# Patient Record
Sex: Male | Born: 1945
Health system: Southern US, Community
[De-identification: ages and names within clinical notes are randomized; demographics above are authoritative.]

## PROBLEM LIST (undated history)

## (undated) DIAGNOSIS — F4310 Post-traumatic stress disorder, unspecified: Secondary | ICD-10-CM

## (undated) DIAGNOSIS — R030 Elevated blood-pressure reading, without diagnosis of hypertension: Secondary | ICD-10-CM

## (undated) DIAGNOSIS — J45909 Unspecified asthma, uncomplicated: Secondary | ICD-10-CM

## (undated) DIAGNOSIS — K219 Gastro-esophageal reflux disease without esophagitis: Secondary | ICD-10-CM

## (undated) DIAGNOSIS — D649 Anemia, unspecified: Secondary | ICD-10-CM

## (undated) DIAGNOSIS — F32A Depression, unspecified: Secondary | ICD-10-CM

## (undated) DIAGNOSIS — F419 Anxiety disorder, unspecified: Secondary | ICD-10-CM

## (undated) DIAGNOSIS — Z86010 Personal history of colonic polyps: Secondary | ICD-10-CM

## (undated) DIAGNOSIS — Z860101 Personal history of adenomatous and serrated colon polyps: Secondary | ICD-10-CM

## (undated) DIAGNOSIS — G8929 Other chronic pain: Secondary | ICD-10-CM

## (undated) DIAGNOSIS — Z8709 Personal history of other diseases of the respiratory system: Secondary | ICD-10-CM

## (undated) DIAGNOSIS — G4730 Sleep apnea, unspecified: Secondary | ICD-10-CM

## (undated) DIAGNOSIS — F329 Major depressive disorder, single episode, unspecified: Secondary | ICD-10-CM

## (undated) DIAGNOSIS — M1990 Unspecified osteoarthritis, unspecified site: Secondary | ICD-10-CM

## (undated) DIAGNOSIS — R7303 Prediabetes: Secondary | ICD-10-CM

## (undated) HISTORY — DX: Unspecified asthma, uncomplicated: J45.909

## (undated) HISTORY — DX: Post-traumatic stress disorder, unspecified: F43.10

## (undated) HISTORY — DX: Prediabetes: R73.03

## (undated) HISTORY — DX: Major depressive disorder, single episode, unspecified: F32.9

## (undated) HISTORY — DX: Gastro-esophageal reflux disease without esophagitis: K21.9

## (undated) HISTORY — DX: Anxiety disorder, unspecified: F41.9

## (undated) HISTORY — DX: Unspecified osteoarthritis, unspecified site: M19.90

## (undated) HISTORY — PX: NASAL SINUS SURGERY: SHX719

## (undated) HISTORY — DX: Personal history of other diseases of the respiratory system: Z87.09

## (undated) HISTORY — DX: Elevated blood-pressure reading, without diagnosis of hypertension: R03.0

## (undated) HISTORY — PX: COLONOSCOPY: SHX174

## (undated) HISTORY — PX: POLYPECTOMY: SHX149

## (undated) HISTORY — DX: Anemia, unspecified: D64.9

## (undated) HISTORY — PX: FINGER SURGERY: SHX640

## (undated) HISTORY — DX: Depression, unspecified: F32.A

## (undated) HISTORY — DX: Sleep apnea, unspecified: G47.30

---

## 2000-06-10 ENCOUNTER — Encounter: Admission: RE | Admit: 2000-06-10 | Discharge: 2000-06-10 | Payer: Self-pay | Admitting: *Deleted

## 2000-06-10 ENCOUNTER — Encounter: Payer: Self-pay | Admitting: Allergy and Immunology

## 2001-01-07 ENCOUNTER — Encounter: Payer: Self-pay | Admitting: Pulmonary Disease

## 2001-01-07 ENCOUNTER — Encounter: Admission: RE | Admit: 2001-01-07 | Discharge: 2001-01-07 | Payer: Self-pay | Admitting: Pulmonary Disease

## 2004-05-15 ENCOUNTER — Ambulatory Visit: Payer: Self-pay | Admitting: Pulmonary Disease

## 2004-07-04 ENCOUNTER — Ambulatory Visit: Payer: Self-pay | Admitting: Pulmonary Disease

## 2005-02-05 ENCOUNTER — Ambulatory Visit: Payer: Self-pay | Admitting: Pulmonary Disease

## 2005-06-10 ENCOUNTER — Ambulatory Visit: Payer: Self-pay | Admitting: Pulmonary Disease

## 2005-08-20 ENCOUNTER — Encounter: Admission: RE | Admit: 2005-08-20 | Discharge: 2005-08-20 | Payer: Self-pay | Admitting: Otolaryngology

## 2006-05-13 ENCOUNTER — Ambulatory Visit: Payer: Self-pay | Admitting: Pulmonary Disease

## 2007-02-24 ENCOUNTER — Ambulatory Visit: Payer: Self-pay | Admitting: Pulmonary Disease

## 2008-03-14 ENCOUNTER — Encounter: Payer: Self-pay | Admitting: Pulmonary Disease

## 2008-03-21 ENCOUNTER — Encounter: Admission: RE | Admit: 2008-03-21 | Discharge: 2008-03-21 | Payer: Self-pay | Admitting: Otolaryngology

## 2008-03-31 DIAGNOSIS — R7301 Impaired fasting glucose: Secondary | ICD-10-CM | POA: Insufficient documentation

## 2008-03-31 DIAGNOSIS — D649 Anemia, unspecified: Secondary | ICD-10-CM | POA: Insufficient documentation

## 2008-03-31 DIAGNOSIS — D509 Iron deficiency anemia, unspecified: Secondary | ICD-10-CM | POA: Insufficient documentation

## 2008-03-31 DIAGNOSIS — K219 Gastro-esophageal reflux disease without esophagitis: Secondary | ICD-10-CM

## 2008-03-31 DIAGNOSIS — R519 Headache, unspecified: Secondary | ICD-10-CM | POA: Insufficient documentation

## 2008-03-31 DIAGNOSIS — R071 Chest pain on breathing: Secondary | ICD-10-CM

## 2008-03-31 DIAGNOSIS — J209 Acute bronchitis, unspecified: Secondary | ICD-10-CM

## 2008-03-31 DIAGNOSIS — R51 Headache: Secondary | ICD-10-CM

## 2008-04-01 ENCOUNTER — Ambulatory Visit: Payer: Self-pay | Admitting: Pulmonary Disease

## 2008-04-29 ENCOUNTER — Ambulatory Visit: Payer: Self-pay | Admitting: Pulmonary Disease

## 2008-05-24 ENCOUNTER — Ambulatory Visit: Payer: Self-pay | Admitting: Pulmonary Disease

## 2008-05-26 DIAGNOSIS — F411 Generalized anxiety disorder: Secondary | ICD-10-CM | POA: Insufficient documentation

## 2008-05-26 DIAGNOSIS — J329 Chronic sinusitis, unspecified: Secondary | ICD-10-CM | POA: Insufficient documentation

## 2008-05-26 DIAGNOSIS — I10 Essential (primary) hypertension: Secondary | ICD-10-CM | POA: Insufficient documentation

## 2008-05-26 LAB — CONVERTED CEMR LAB
ALT: 14 units/L (ref 0–53)
AST: 25 units/L (ref 0–37)
Albumin: 3.7 g/dL (ref 3.5–5.2)
Alkaline Phosphatase: 60 units/L (ref 39–117)
BUN: 14 mg/dL (ref 6–23)
Basophils Absolute: 0 10*3/uL (ref 0.0–0.1)
Basophils Relative: 0 % (ref 0.0–3.0)
Bilirubin, Direct: 0.2 mg/dL (ref 0.0–0.3)
CO2: 31 meq/L (ref 19–32)
Calcium: 9.1 mg/dL (ref 8.4–10.5)
Chloride: 103 meq/L (ref 96–112)
Creatinine, Ser: 1 mg/dL (ref 0.4–1.5)
Eosinophils Absolute: 0.3 10*3/uL (ref 0.0–0.7)
Eosinophils Relative: 6 % — ABNORMAL HIGH (ref 0.0–5.0)
GFR calc Af Amer: 97 mL/min
GFR calc non Af Amer: 80 mL/min
Glucose, Bld: 86 mg/dL (ref 70–99)
HCT: 35.7 % — ABNORMAL LOW (ref 39.0–52.0)
Hemoglobin: 11.4 g/dL — ABNORMAL LOW (ref 13.0–17.0)
Hgb A1c MFr Bld: 5.9 % (ref 4.6–6.0)
Lymphocytes Relative: 43.4 % (ref 12.0–46.0)
MCHC: 31.3 g/dL (ref 30.0–36.0)
MCV: 66.4 fL — ABNORMAL LOW (ref 78.0–100.0)
Monocytes Absolute: 0.6 10*3/uL (ref 0.1–1.0)
Monocytes Relative: 11.4 % (ref 3.0–12.0)
Neutro Abs: 2.1 10*3/uL (ref 1.4–7.7)
Neutrophils Relative %: 39.2 % — ABNORMAL LOW (ref 43.0–77.0)
PSA: 0.68 ng/mL (ref 0.10–4.00)
Platelets: 145 10*3/uL — ABNORMAL LOW (ref 150–400)
Potassium: 4.2 meq/L (ref 3.5–5.1)
RBC: 5.49 M/uL (ref 4.22–5.81)
RDW: 15.5 % — ABNORMAL HIGH (ref 11.5–14.6)
Sodium: 139 meq/L (ref 135–145)
TSH: 1.34 microintl units/mL (ref 0.35–5.50)
Total Bilirubin: 0.9 mg/dL (ref 0.3–1.2)
Total Protein: 6.8 g/dL (ref 6.0–8.3)
WBC: 5.3 10*3/uL (ref 4.5–10.5)

## 2010-06-12 ENCOUNTER — Encounter: Payer: Self-pay | Admitting: Pulmonary Disease

## 2010-06-15 ENCOUNTER — Encounter
Admission: RE | Admit: 2010-06-15 | Discharge: 2010-06-15 | Payer: Self-pay | Source: Home / Self Care | Attending: Otolaryngology | Admitting: Otolaryngology

## 2010-06-21 ENCOUNTER — Ambulatory Visit: Payer: Self-pay | Admitting: Pulmonary Disease

## 2010-06-21 DIAGNOSIS — M1990 Unspecified osteoarthritis, unspecified site: Secondary | ICD-10-CM | POA: Insufficient documentation

## 2010-06-21 DIAGNOSIS — F4310 Post-traumatic stress disorder, unspecified: Secondary | ICD-10-CM | POA: Insufficient documentation
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
## 2010-08-02 NOTE — Letter (Signed)
Summary: [facility]  [facility]   Imported By: Sherian Rein 07/05/2010 09:49:16  _____________________________________________________________________  External Attachment:    Type:   Image     Comment:   External Document

## 2011-08-06 ENCOUNTER — Telehealth: Payer: Self-pay | Admitting: Pulmonary Disease

## 2011-08-06 MED ORDER — FIRST-DUKES MOUTHWASH MT SUSP: OROMUCOSAL | Status: DC

## 2011-08-06 MED ORDER — AZITHROMYCIN 250 MG PO TABS: ORAL_TABLET | ORAL | Status: AC

## 2011-08-06 NOTE — Telephone Encounter (Signed)
I spoke with pt and is aware rx's where sent to the pharmacy

## 2011-08-06 NOTE — Telephone Encounter (Signed)
Per SN---we have no openings---we can call in zpak #1  Take as directed with no refills, mucinex 2 po bid with plenty of fluids and nasal saline spray every 1-2 hours while awake.  If severe symptoms pt will need to be seen at Urgent care.  thanks

## 2011-08-06 NOTE — Telephone Encounter (Signed)
I spoke with pt and advised him of SN recs. He also states he would like MMW called in for him as well. Please advise Dr. Kriste Basque, thanks

## 2011-08-06 NOTE — Telephone Encounter (Signed)
Ok for the MMW   #4oz  1 tsp gargle and swallow four times daily as needed with 1 refill.  thanks

## 2011-08-06 NOTE — Telephone Encounter (Signed)
I spoke with pt and he c/o nasal congestion, chills, cough w/ thick white phlem sore throat x 2 days-denies any fever, nausea, vomiting. Pt has not tried anything OTC. Pt wanted to come in for OV but nothing available. Pt then would like something called in. Pt last seen 06/11/11. Please advise Dr. Kriste Basque, thanks  Allergies no known allergies   [facility]

## 2011-12-27 ENCOUNTER — Telehealth: Payer: Self-pay | Admitting: Pulmonary Disease

## 2011-12-27 MED ORDER — AZITHROMYCIN 250 MG PO TABS: ORAL_TABLET | ORAL | Status: AC

## 2011-12-27 NOTE — Telephone Encounter (Signed)
Per SN--ok for pt to have zpak #1  Take as directed with no refills, start on mucinex otc 600mg   2 po bid with plenty of fluids.  Called and  Spoke with pt and he is aware of SN recs.  Nothing further needed.

## 2011-12-27 NOTE — Telephone Encounter (Signed)
Spoke with pt. He is c/o head and chest congestion and prod cough with large amounts of tan colored sputum. He states no SOB, CP or tightness, f/c/s. Would like something called in. He was last seen in 2011. I have scheduled him rov with SN for august. Please advise thanks! Allergies no known allergies

## 2012-02-04 ENCOUNTER — Encounter: Payer: Self-pay | Admitting: Pulmonary Disease

## 2012-02-05 ENCOUNTER — Encounter: Payer: Self-pay | Admitting: Pulmonary Disease

## 2012-02-05 ENCOUNTER — Ambulatory Visit (INDEPENDENT_AMBULATORY_CARE_PROVIDER_SITE_OTHER): Payer: Medicare Other | Admitting: Pulmonary Disease

## 2012-02-05 VITALS — BP 138/88 | HR 54 | Temp 97.2°F | Ht 74.0 in | Wt 262.2 lb

## 2012-02-05 DIAGNOSIS — I10 Essential (primary) hypertension: Secondary | ICD-10-CM

## 2012-02-05 DIAGNOSIS — J209 Acute bronchitis, unspecified: Secondary | ICD-10-CM

## 2012-02-05 DIAGNOSIS — M1990 Unspecified osteoarthritis, unspecified site: Secondary | ICD-10-CM

## 2012-02-05 DIAGNOSIS — F4310 Post-traumatic stress disorder, unspecified: Secondary | ICD-10-CM

## 2012-02-05 DIAGNOSIS — K219 Gastro-esophageal reflux disease without esophagitis: Secondary | ICD-10-CM

## 2012-02-05 DIAGNOSIS — D649 Anemia, unspecified: Secondary | ICD-10-CM

## 2012-02-05 NOTE — Patient Instructions (Addendum)
Today we updated your med list in our EPIC system...    Continue your current medications the same...  We reviewed your prev lab data from 2009 & gave you a copy to show the [place]...    If it's convenient- forward a copy of the [facility] labs to my attention...  Don't forget to set up your follow up appts w/ DrPatterson for GI...  Call for any questions.Marland KitchenMarland Kitchen
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
## 2012-06-11 ENCOUNTER — Telehealth: Payer: Self-pay | Admitting: Pulmonary Disease

## 2012-06-11 NOTE — Telephone Encounter (Signed)
I spoke with pt and he states he ate salty greens yesterday and believe his BP was a little high. He did not check it but afterwards felt like his "head was going to fall off" and felt like this x 1 hr. As he recalls he has never felt like this in the past and has not had any problems with his bp in the past. He believes he needs to see a heart specialists or maybe SN could help him with this. Pt does not monitor his BP. Please advise SN thanks Last OV 02/05/12 BP then was 138/88 No pending OV  No Known Allergies

## 2012-06-11 NOTE — Telephone Encounter (Signed)
Per SN---his best option for cardiac eval is through the [place] [facility]---they do all of his labs and they can get him in to see cardiologist. Pt voiced his understanding of this and will try to get set up with cardiology through the [place].

## 2012-07-07 ENCOUNTER — Telehealth: Payer: Self-pay | Admitting: Pulmonary Disease

## 2012-07-07 NOTE — Telephone Encounter (Signed)
Scheduled pt appt to see tp at 9am 07/08/12--pt aware of appt

## 2012-07-08 ENCOUNTER — Encounter: Payer: Self-pay | Admitting: Adult Health

## 2012-07-08 ENCOUNTER — Ambulatory Visit (INDEPENDENT_AMBULATORY_CARE_PROVIDER_SITE_OTHER): Payer: Medicare Other | Admitting: Adult Health

## 2012-07-08 VITALS — BP 140/80 | HR 55 | Temp 98.0°F | Ht 74.0 in | Wt 257.8 lb

## 2012-07-08 DIAGNOSIS — J069 Acute upper respiratory infection, unspecified: Secondary | ICD-10-CM

## 2012-07-08 MED ORDER — AZITHROMYCIN 250 MG PO TABS: ORAL_TABLET | ORAL | Status: AC
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
## 2012-07-08 NOTE — Assessment & Plan Note (Signed)
Zpack take as directed.  Mucinex DM Twice daily  As needed  For cough and congestion  Tylenol As needed   Saline nasal rinses As needed   Fluids and rest  Please contact office for sooner follow up if symptoms do not improve or worsen or seek emergency care   

## 2012-07-08 NOTE — Patient Instructions (Addendum)
Zpack take as directed.  Mucinex DM Twice daily  As needed  For cough and congestion  Tylenol As needed   Saline nasal rinses As needed   Fluids and rest  Please contact office for sooner follow up if symptoms do not improve or worsen or seek emergency care

## 2012-08-24 ENCOUNTER — Encounter: Payer: Self-pay | Admitting: Pulmonary Disease

## 2012-08-24 ENCOUNTER — Ambulatory Visit (INDEPENDENT_AMBULATORY_CARE_PROVIDER_SITE_OTHER): Payer: Medicare Other | Admitting: Pulmonary Disease

## 2012-08-24 VITALS — BP 144/86 | HR 59 | Temp 99.5°F | Ht 74.0 in | Wt 260.0 lb

## 2012-08-24 DIAGNOSIS — R7309 Other abnormal glucose: Secondary | ICD-10-CM

## 2012-08-24 DIAGNOSIS — M1990 Unspecified osteoarthritis, unspecified site: Secondary | ICD-10-CM

## 2012-08-24 DIAGNOSIS — F4310 Post-traumatic stress disorder, unspecified: Secondary | ICD-10-CM

## 2012-08-24 DIAGNOSIS — K219 Gastro-esophageal reflux disease without esophagitis: Secondary | ICD-10-CM

## 2012-08-24 DIAGNOSIS — I10 Essential (primary) hypertension: Secondary | ICD-10-CM

## 2012-08-24 DIAGNOSIS — R51 Headache: Secondary | ICD-10-CM

## 2012-08-24 DIAGNOSIS — D649 Anemia, unspecified: Secondary | ICD-10-CM

## 2012-08-24 NOTE — Progress Notes (Deleted)
Subjective:     Patient ID: Shane [name], male   DOB: [birth year], 66 y.o.   MRN: [id number]  HPI   Review of Systems     Objective:   Physical Exam     Assessment:     ***    Plan:     ***
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
## 2012-08-24 NOTE — Patient Instructions (Addendum)
Today we updated your med list in our EPIC system...    Continue your current medications the same...  Be sure to get your general check up at the [facility] w/ CXR, EKG, & FASTING blood work...    Please send copies of these results to my attention...  You are due for a colonoscopy from drPatterson-    Contact his office at [phone]  Call for any problems.Marland KitchenMarland Kitchen

## 2012-08-25 ENCOUNTER — Encounter: Payer: Self-pay | Admitting: Gastroenterology

## 2012-09-10 ENCOUNTER — Encounter: Payer: Self-pay | Admitting: *Deleted

## 2012-09-15 ENCOUNTER — Encounter: Payer: Self-pay | Admitting: Gastroenterology

## 2012-09-15 ENCOUNTER — Ambulatory Visit (INDEPENDENT_AMBULATORY_CARE_PROVIDER_SITE_OTHER): Payer: Medicare Other | Admitting: Gastroenterology

## 2012-09-15 VITALS — BP 128/86 | HR 68 | Ht 71.65 in | Wt 251.0 lb

## 2012-09-15 DIAGNOSIS — D649 Anemia, unspecified: Secondary | ICD-10-CM

## 2012-09-15 DIAGNOSIS — Z1211 Encounter for screening for malignant neoplasm of colon: Secondary | ICD-10-CM

## 2012-09-15 DIAGNOSIS — E739 Lactose intolerance, unspecified: Secondary | ICD-10-CM

## 2012-09-15 DIAGNOSIS — K219 Gastro-esophageal reflux disease without esophagitis: Secondary | ICD-10-CM

## 2012-09-15 DIAGNOSIS — Z86010 Personal history of colonic polyps: Secondary | ICD-10-CM

## 2012-09-15 MED ORDER — MOVIPREP 100 G PO SOLR: 1.0000 | Freq: Once | ORAL | Status: DC

## 2012-09-15 NOTE — Patient Instructions (Signed)
  You have been scheduled for an endoscopy and colonoscopy with propofol. Please follow the written instructions given to you at your visit today. Please pick up your prep at the pharmacy within the next 1-3 days. If you use inhalers (even only as needed), please bring them with you on the day of your procedure. ______________________________________________________________________________________________                                               We are excited to introduce MyChart, a new best-in-class service that provides you online access to important information in your electronic medical record. We want to make it easier for you to view your health information - all in one secure location - when and where you need it. We expect MyChart will enhance the quality of care and service we provide.  When you register for MyChart, you can:    View your test results.    Request appointments and receive appointment reminders via email.    Request medication renewals.    View your medical history, allergies, medications and immunizations.    Communicate with your physician's office through a password-protected site.    Conveniently print information such as your medication lists.  To find out if MyChart is right for you, please talk to a member of our clinical staff today. We will gladly answer your questions about this free health and wellness tool.  If you are age 68 or older and want a member of your family to have access to your record, you must provide written consent by completing a proxy form available at our office. Please speak to our clinical staff about guidelines regarding accounts for patients younger than age 68.  As you activate your MyChart account and need any technical assistance, please call the MyChart technical support line at [phone]) or email your question to [email or web address]. If you email your question(s), please include your name, a  return phone number and the best time to reach you.  If you have non-urgent health-related questions, you can send a message to our office through MyChart at [email or web address]. If you have a medical emergency, call 911.  Thank you for using MyChart as your new health and wellness resource!   MyChart licensed from [employer],  [id number]. Patents Pending.

## 2012-09-15 NOTE — Progress Notes (Signed)
History of Present Illness:  This is a very nice 67 year old African American male with thalassemia and chronic anemia.  Seen him for the last 20 years because of chronic GERD, and he is been [facility].  He is been on and off of PPI medication for the last 15 years, and denies dysphagia or significant GERD at this time.  He also has regular bowel movements and denies melena or hematochezia.  Last colonoscopy was in 1989, and he did have removal of benign polyps several years before that.  He denies any GI problems for occasional hemorrhoidal rectal bleeding.  His appetite is good and his weight is stable, he has major lactose intolerance.  Family history is noncontributory.  Primary care evaluation showed stable asthma, but he otherwise is in good condition except for some PTSD psychiatric problems.  I have reviewed this patient's present history, medical and surgical past history, allergies and medications.     ROS:   All systems were reviewed and are negative unless otherwise stated in the HPI.    Physical Exam: Blood pressure 128/86, pulse 68 and regular and weight 251 pounds the BMI of 34.37.  98% oxygen saturation. General well developed well nourished patient in no acute distress, appearing their stated age Eyes PERRLA, no icterus, fundoscopic exam per opthamologist Skin no lesions noted Neck supple, no adenopathy, no thyroid enlargement, no tenderness Chest clear to percussion and auscultation Heart no significant murmurs, gallops or rubs noted Abdomen no hepatosplenomegaly masses or tenderness, BS normal.  Extremities no acute joint lesions, edema, phlebitis or evidence of cellulitis. Neurologic patient oriented x 3, cranial nerves intact, no focal neurologic deficits noted. Psychological mental status normal and normal affect.  Assessment and plan: Healthy patient with thalassemia and mild anemia.  He is due for followup  colonoscopy which has been scheduled at his convenience.  I have reviewed the risk and benefits of this procedure, various alternative examinations of the colon, and his colon prep.  We also will perform endoscopy to screen for Barrett's mucosa.  Again, I have reviewed chronic acid reflux regime with this patient.  I advised him about a lactose-free diet but he can use when necessary Lactaid tablets as tolerated.  I also advised him to take daily PPI therapy because of his chronic GERD which in the past been documented by endoscopy and 24-hour pH probe testing.  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  Previous 24-hour pH probe testing and 2002 showed normal esophageal clearance with upright acid reflux distally and proximally.  Previous testing for H. pylori was negative.  Cannot find previous pathology reports.  Encounter Diagnoses  Name Primary?  Marland Kitchen Anemia Yes  . GERD (gastroesophageal reflux disease)   . Special screening for malignant neoplasms, colon

## 2012-09-17 ENCOUNTER — Telehealth: Payer: Self-pay | Admitting: Gastroenterology

## 2012-09-17 MED ORDER — CLORAZEPATE DIPOTASSIUM 7.5 MG PO TABS: 7.5000 mg | ORAL_TABLET | Freq: Three times a day (TID) | ORAL | Status: DC | PRN

## 2012-09-17 NOTE — Telephone Encounter (Signed)
Called and spoke with pt and he is aware of SN recs to try the clorazepate 7.5mg   #90  1 po tid prn for his headaches.   i called and spoke with pt and he is aware of SN recs and will try this medication.  Pt is aware that this has been called to the pharmacy.  Nothing further is needed.

## 2012-09-17 NOTE — Telephone Encounter (Signed)
Spoke with pt and notified of recs per SN He states that he does not want to start back on the clonazepam at this time Wants something short term that he would not need to be weaned off of I offered ov with TP for next wk to discuss, but he states that he wants something now SN, please advise thanks

## 2012-09-17 NOTE — Telephone Encounter (Signed)
Per SN---  In SN opinion---the clorazepam 0.5 mg  Bid  He was on previously would help with this problem and prevent his headaches.  We can send this in for him if he would like.  thanks

## 2012-09-17 NOTE — Telephone Encounter (Signed)
Pt c/o headache since yesterday. He is concerned because he takes Tylenol and the headache comes right back. He denies any problems with vision, weakness on one side of his body, speech, etc. I asked if he has HTN d/t I saw no meds, and he stated no. Informed pt that this should not affect his procedures next week; EGD and COLON on 09/23/12. I asked him to call Dr Jodelle Green ofc and he asked if I could do this. Dr Kriste Basque, could you or your CMA call pt to check on him? Thanks. Graciella Freer RN for Doctors Jarold Motto and Pyrtle  Ext [phone]

## 2012-09-23 ENCOUNTER — Encounter: Payer: Self-pay | Admitting: Gastroenterology

## 2012-09-23 ENCOUNTER — Ambulatory Visit (AMBULATORY_SURGERY_CENTER): Payer: Medicare Other | Admitting: Gastroenterology

## 2012-09-23 VITALS — BP 150/86 | HR 58 | Temp 98.5°F | Resp 14 | Ht 71.0 in | Wt 251.0 lb

## 2012-09-23 DIAGNOSIS — K219 Gastro-esophageal reflux disease without esophagitis: Secondary | ICD-10-CM

## 2012-09-23 DIAGNOSIS — D126 Benign neoplasm of colon, unspecified: Secondary | ICD-10-CM

## 2012-09-23 DIAGNOSIS — Z1211 Encounter for screening for malignant neoplasm of colon: Secondary | ICD-10-CM

## 2012-09-23 DIAGNOSIS — Z86010 Personal history of colonic polyps: Secondary | ICD-10-CM

## 2012-09-23 MED ORDER — HYDROCORTISONE ACE-PRAMOXINE 1-1 % RE CREA: TOPICAL_CREAM | Freq: Two times a day (BID) | RECTAL | Status: DC

## 2012-09-23 MED ORDER — SODIUM CHLORIDE 0.9 % IV SOLN: 500.0000 mL | INTRAVENOUS | Status: DC

## 2012-09-23 NOTE — Op Note (Signed)
[facility] [address] [place], [address]   ENDOSCOPY PROCEDURE REPORT  PATIENT: Shane, [name]  MR#: [id number] BIRTHDATE: 1946/04/13 , 67  yrs. old GENDER: Male ENDOSCOPIST:David Hale Bogus, MD, [facility] REFERRED BY: PROCEDURE DATE:  09/23/2012 PROCEDURE:   EGD, diagnostic ASA CLASS:    Class II INDICATIONS: history of GERD. MEDICATION: There was residual sedation effect present from prior procedure and propofol (Diprivan) 100mg  IV TOPICAL ANESTHETIC:  DESCRIPTION OF PROCEDURE:   After the risks and benefits of the procedure were explained, informed consent was obtained.  The LB GIF-H180 [id number]  endoscope was introduced through the mouth  and advanced to the second portion of the duodenum .  The instrument was slowly withdrawn as the mucosa was fully examined.      DUODENUM: The duodenal mucosa showed no abnormalities in the bulb and second portion of the duodenum.  STOMACH: The mucosa of the stomach appeared normal.  ESOPHAGUS: The mucosa of the esophagus appeared normal. Retroflexed views  lrevealed a small  hiatal hernia.    The scope was then withdrawn from the patient and the procedure completed.No Barrett's mucosa noted....  COMPLICATIONS: There were no complications.   ENDOSCOPIC IMPRESSION: 1.   The duodenal mucosa showed no abnormalities in the bulb and second portion of the duodenum 2.   The mucosa of the stomach appeared normal 3.   The mucosa of the esophagus appeared normal ..No Barrett's mucosa noted.  RECOMMENDATIONS: Continue PPI    _______________________________ eSigned:  Mardella Layman, MD, [facility] 09/23/2012 1:57 PM   standard discharge

## 2012-09-23 NOTE — Patient Instructions (Addendum)
YOU HAD AN ENDOSCOPIC PROCEDURE TODAY AT THE [facility]: Refer to the procedure report that was given to you for any specific questions about what was found during the examination.  If the procedure report does not answer your questions, please call your gastroenterologist to clarify.  If you requested that your care partner not be given the details of your procedure findings, then the procedure report has been included in a sealed envelope for you to review at your convenience later.  YOU SHOULD EXPECT: Some feelings of bloating in the abdomen. Passage of more gas than usual.  Walking can help get rid of the air that was put into your GI tract during the procedure and reduce the bloating. If you had a lower endoscopy (such as a colonoscopy or flexible sigmoidoscopy) you may notice spotting of blood in your stool or on the toilet paper. If you underwent a bowel prep for your procedure, then you may not have a normal bowel movement for a few days.  DIET: Your first meal following the procedure should be a light meal and then it is ok to progress to your normal diet.  A half-sandwich or bowl of soup is an example of a good first meal.  Heavy or fried foods are harder to digest and may make you feel nauseous or bloated.  Likewise meals heavy in dairy and vegetables can cause extra gas to form and this can also increase the bloating.  Drink plenty of fluids but you should avoid alcoholic beverages for 24 hours.  ACTIVITY: Your care partner should take you home directly after the procedure.  You should plan to take it easy, moving slowly for the rest of the day.  You can resume normal activity the day after the procedure however you should NOT DRIVE or use heavy machinery for 24 hours (because of the sedation medicines used during the test).    SYMPTOMS TO REPORT IMMEDIATELY: A gastroenterologist can be reached at any hour.  During normal business hours, 8:30 AM to 5:00 PM Monday through Friday,  call [phone].  After hours and on weekends, please call the GI answering service at [phone] who will take a message and have the physician on call contact you.   Following lower endoscopy (colonoscopy or flexible sigmoidoscopy):  Excessive amounts of blood in the stool  Significant tenderness or worsening of abdominal pains  Swelling of the abdomen that is new, acute  Fever of 100F or higher  Following upper endoscopy (EGD)  Vomiting of blood or coffee ground material  New chest pain or pain under the shoulder blades  Painful or persistently difficult swallowing  New shortness of breath  Fever of 100F or higher  Black, tarry-looking stools  FOLLOW UP: If any biopsies were taken you will be contacted by phone or by letter within the next 1-3 weeks.  Call your gastroenterologist if you have not heard about the biopsies in 3 weeks.  Our staff will call the home number listed on your records the next business day following your procedure to check on you and address any questions or concerns that you may have at that time regarding the information given to you following your procedure. This is a courtesy call and so if there is no answer at the home number and we have not heard from you through the emergency physician on call, we will assume that you have returned to your regular daily activities without incident.  SIGNATURES/CONFIDENTIALITY: You and/or your care   partner have signed paperwork which will be entered into your electronic medical record.  These signatures attest to the fact that that the information above on your After Visit Summary has been reviewed and is understood.  Full responsibility of the confidentiality of this discharge information lies with you and/or your care-partner.  

## 2012-09-23 NOTE — Op Note (Signed)
[facility] [address] [place], [address]   COLONOSCOPY PROCEDURE REPORT  PATIENT: Shane [name], Shane [name]  MR#: [id number] BIRTHDATE: [birth year] , 67  yrs. old GENDER: Male ENDOSCOPIST: Mardella Layman, MD, [facility] REFERRED BY: PROCEDURE DATE:  09/23/2012 PROCEDURE:   Colonoscopy with snare polypectomy ASA CLASS:   Class II INDICATIONS:Average risk patient for colon cancer. MEDICATIONS: propofol (Diprivan) 200mg  IV  DESCRIPTION OF PROCEDURE:   After the risks and benefits and of the procedure were explained, informed consent was obtained.  A digital rectal exam revealed no abnormalities of the rectum.    The LB CF-H180AL [id number]  endoscope was introduced through the anus and advanced to the cecum, which was identified by both the appendix and ileocecal valve .  The quality of the prep was excellent, using MoviPrep .  The instrument was then slowly withdrawn as the colon was fully examined.     COLON FINDINGS: A fungating sessile polyp ranging between 3-43mm in size was found in the sigmoid colon.  A polypectomy was performed with a cold snare.  The resection was complete and the polyp tissue was completely retrieved.   The colon was otherwise normal.  There was no diverticulosis, inflammation, polyps or cancers unless previously stated.     Retroflexed views revealed no abnormalities. The scope was then withdrawn from the patient and the procedure completed.  COMPLICATIONS: There were no complications. ENDOSCOPIC IMPRESSION: 1.   Sessile polyp ranging between 3-80mm in size was found in the sigmoid colon; polypectomy was performed with a cold snare 2.   The colon was otherwise normal  RECOMMENDATIONS: 1.  Await pathology results 2.  Repeat colonoscopy in 5 years if polyp adenomatous; otherwise 10 years   REPEAT EXAM:  cc:  _______________________________ eSignedMardella Layman, MD, [facility] 09/23/2012 1:52 PM

## 2012-09-23 NOTE — Progress Notes (Signed)
Called to room to assist during endoscopic procedure.  Patient ID and intended procedure confirmed with present staff. Received instructions for my participation in the procedure from the performing physician.  

## 2012-09-23 NOTE — Progress Notes (Signed)
Lidocaine-40mg IV prior to Propofol InductionPropofol given over incremental dosages 

## 2012-09-23 NOTE — Progress Notes (Signed)
Patient did not experience any of the following events: a burn prior to discharge; a fall within the facility; wrong site/side/patient/procedure/implant event; or a hospital transfer or hospital admission upon discharge from the facility. (G8907) Patient did not have preoperative order for IV antibiotic SSI prophylaxis. (G8918)  

## 2012-09-24 ENCOUNTER — Telehealth: Payer: Self-pay | Admitting: *Deleted

## 2012-09-24 NOTE — Telephone Encounter (Signed)
  Follow up Call-  Call back number 09/23/2012  Post procedure Call Back phone  # [phone]  Permission to leave phone message Yes     Patient questions:  Do you have a fever, pain , or abdominal swelling? no Pain Score  0 *  Have you tolerated food without any problems? yes  Have you been able to return to your normal activities? yes  Do you have any questions about your discharge instructions: Diet   no Medications  no Follow up visit  no  Do you have questions or concerns about your Care? yes  Actions: * If pain score is 4 or above: No action needed, pain <4. Patient stating he did see some blood in bowel movement. Stating he did not see clots and unsure how much blood.patient indicating a small quantity of blood. Patient denies abdominal pain or swelling. No fever has been recognized by the patient. Reviewed with the patient signs and symptoms to watch for indicating problems. Instructed where our telephone number is located on discharge instructions and to call us back if any further bleeding or any other complications noted. Patient verbalized agreement.

## 2012-09-30 ENCOUNTER — Encounter: Payer: Self-pay | Admitting: Gastroenterology

## 2012-10-28 ENCOUNTER — Telehealth: Payer: Self-pay

## 2012-10-28 NOTE — Telephone Encounter (Signed)
Patient requested and given copy of his recent colonoscopy and pathology report , 5 year recall in computer.

## 2013-02-16 ENCOUNTER — Encounter: Payer: Self-pay | Admitting: Pulmonary Disease

## 2013-02-16 ENCOUNTER — Ambulatory Visit (INDEPENDENT_AMBULATORY_CARE_PROVIDER_SITE_OTHER): Payer: Medicare Other | Admitting: Pulmonary Disease

## 2013-02-16 VITALS — BP 122/88 | HR 51 | Temp 98.9°F | Ht 74.0 in | Wt 248.4 lb

## 2013-02-16 DIAGNOSIS — J209 Acute bronchitis, unspecified: Secondary | ICD-10-CM

## 2013-02-16 DIAGNOSIS — M1990 Unspecified osteoarthritis, unspecified site: Secondary | ICD-10-CM

## 2013-02-16 DIAGNOSIS — J019 Acute sinusitis, unspecified: Secondary | ICD-10-CM

## 2013-02-16 DIAGNOSIS — R7309 Other abnormal glucose: Secondary | ICD-10-CM

## 2013-02-16 DIAGNOSIS — F411 Generalized anxiety disorder: Secondary | ICD-10-CM

## 2013-02-16 DIAGNOSIS — F4310 Post-traumatic stress disorder, unspecified: Secondary | ICD-10-CM

## 2013-02-16 DIAGNOSIS — K219 Gastro-esophageal reflux disease without esophagitis: Secondary | ICD-10-CM

## 2013-02-16 DIAGNOSIS — Z860100 Personal history of colon polyps, unspecified: Secondary | ICD-10-CM | POA: Insufficient documentation

## 2013-02-16 DIAGNOSIS — I872 Venous insufficiency (chronic) (peripheral): Secondary | ICD-10-CM

## 2013-02-16 DIAGNOSIS — I10 Essential (primary) hypertension: Secondary | ICD-10-CM

## 2013-02-16 MED ORDER — CLORAZEPATE DIPOTASSIUM 7.5 MG PO TABS: 7.5000 mg | ORAL_TABLET | Freq: Three times a day (TID) | ORAL | Status: DC | PRN

## 2013-02-16 MED ORDER — PREDNISONE (PAK) 5 MG PO TABS: ORAL_TABLET | ORAL | Status: DC

## 2013-02-16 MED ORDER — AMOXICILLIN-POT CLAVULANATE 875-125 MG PO TABS: 1.0000 | ORAL_TABLET | Freq: Two times a day (BID) | ORAL | Status: DC
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
## 2013-02-16 NOTE — Patient Instructions (Addendum)
Today we updated your med list in our EPIC system...    Continue your current medications the same...  For your Sinusitis>>    We have prescribed AUGMENTIN 875mg  take one tab twice daily til gone (for the infection)...    We have also prescribed a Pred dosepak to take as directed for the inflammation...    You may also spray a SALINE nasal mist (OTC) every 1-2h as needed...  I would also recommend a MEN'S FORMULA ULTIVITAMIN & extra VIT D ~2000u daily...  We will arrange for an ENT appt for you to see Dr. Esaw Dace for your sinuses & ears...  Call for any questions...  Let's plan a follow up visit in 81mo, sooner if needed for problems.Marland KitchenMarland Kitchen

## 2013-05-12 ENCOUNTER — Telehealth: Payer: Self-pay | Admitting: Pulmonary Disease

## 2013-05-12 MED ORDER — FIRST-DUKES MOUTHWASH MT SUSP: OROMUCOSAL | Status: DC

## 2013-05-12 MED ORDER — AMOXICILLIN-POT CLAVULANATE 875-125 MG PO TABS: 1.0000 | ORAL_TABLET | Freq: Two times a day (BID) | ORAL | Status: DC

## 2013-05-12 NOTE — Telephone Encounter (Signed)
Per SN---  We have no openings but we will be glad to call in some meds---if he wants to be seen he will need to go to [facility]. We can call in augmentin 875 mg  #14  1 po bid Align once daily mmw  #4oz  1 tsp gargle and swallow four times daily as needed.

## 2013-05-12 NOTE — Telephone Encounter (Signed)
I spoke with pt. Aware of recs. rx's have been sent

## 2013-05-12 NOTE — Telephone Encounter (Signed)
I spoke with pt. He c/o nasal congestion, thick PND, slight cough w/ grey phlem, hoarseness at times x 2 weeks. Denies any fever, chills. He was using flonase nasal spray. He is requesting further recs. Please advise SN thanks Last OV 01/2013 Call cell if he does not answer home #- [phone] No Known Allergies

## 2013-08-18 ENCOUNTER — Ambulatory Visit: Payer: Medicare Other | Admitting: Pulmonary Disease

## 2013-08-23 ENCOUNTER — Telehealth: Payer: Self-pay | Admitting: Pulmonary Disease

## 2013-08-23 NOTE — Telephone Encounter (Signed)
Pt says he can be reached at [phone].Shane [name]

## 2013-08-23 NOTE — Telephone Encounter (Signed)
Please advise where pt can be placed? thanks

## 2013-08-23 NOTE — Telephone Encounter (Signed)
Pt appt scheduled. He is also c/o sneezing spells, body aches comes and goes, prod cough w/ green phlem, occas wheezing, sore throat x Saturday. Taking tylenol and otc cough medication. Please advise SN thanks  No Known Allergies

## 2013-08-23 NOTE — Telephone Encounter (Signed)
Can use the 11:30 on 2/26.  thanks

## 2013-08-23 NOTE — Telephone Encounter (Signed)
LMTCB

## 2013-08-23 NOTE — Telephone Encounter (Signed)
Per SN---  augmentin 875 mg  #14  1 po bid Medrol dosepak #1  Take as directed

## 2013-08-24 MED ORDER — AMOXICILLIN-POT CLAVULANATE 875-125 MG PO TABS: 1.0000 | ORAL_TABLET | Freq: Two times a day (BID) | ORAL | Status: DC

## 2013-08-24 MED ORDER — METHYLPREDNISOLONE 4 MG PO KIT: PACK | ORAL | Status: DC

## 2013-08-24 NOTE — Telephone Encounter (Signed)
Pt is aware of SN recs. Rx's have been called in. Nothing further is needed.

## 2013-08-24 NOTE — Telephone Encounter (Signed)
Pt returned call. Please call back at [phone]

## 2013-08-26 ENCOUNTER — Ambulatory Visit: Payer: Medicare Other | Admitting: Pulmonary Disease

## 2013-08-29 DEATH — deceased

## 2013-09-03 ENCOUNTER — Telehealth: Payer: Self-pay | Admitting: Pulmonary Disease

## 2013-09-03 ENCOUNTER — Encounter: Payer: Self-pay | Admitting: Adult Health

## 2013-09-03 ENCOUNTER — Ambulatory Visit (INDEPENDENT_AMBULATORY_CARE_PROVIDER_SITE_OTHER): Payer: Medicare Other | Admitting: Adult Health

## 2013-09-03 ENCOUNTER — Ambulatory Visit (INDEPENDENT_AMBULATORY_CARE_PROVIDER_SITE_OTHER)
Admission: RE | Admit: 2013-09-03 | Discharge: 2013-09-03 | Disposition: A | Discharge status: Home / Self Care | Payer: Medicare Other | Source: Ambulatory Visit | Attending: Adult Health | Admitting: Adult Health

## 2013-09-03 VITALS — BP 130/74 | HR 67 | Ht 74.0 in | Wt 245.6 lb

## 2013-09-03 DIAGNOSIS — J209 Acute bronchitis, unspecified: Secondary | ICD-10-CM

## 2013-09-03 DIAGNOSIS — R059 Cough, unspecified: Secondary | ICD-10-CM | POA: Insufficient documentation

## 2013-09-03 DIAGNOSIS — R05 Cough: Secondary | ICD-10-CM

## 2013-09-03 MED ORDER — MOMETASONE FUROATE 50 MCG/ACT NA SUSP: 2.0000 | Freq: Every day | NASAL | Status: DC

## 2013-09-03 NOTE — Telephone Encounter (Signed)
Pt refused to give any information on to why he wanted to speak to a nurse. Pt was just seen this morning.

## 2013-09-03 NOTE — Patient Instructions (Signed)
Begin Zyrtec 10mg  over the counter  At bedtime  For drainage  Begin Nasonex Nasal Spray 2 puffs daily  May use Mucinex DM Twice daily  As needed  Cough/congestion  Chest xray today  Please contact office for sooner follow up if symptoms do not improve or worsen or seek emergency care  Dr. Lenna Gilford is retiring from [facility] and we will need to set you up with a new Primary Care provider.  Please let us know if you would like [place] to assist with this referral.

## 2013-09-03 NOTE — Telephone Encounter (Signed)
Called spoke with pt. He was requesting refill on hydrocortison rectal cream. This has been coming from Dr. Sharlett Iles. He will call them for refill

## 2013-09-03 NOTE — Assessment & Plan Note (Signed)
Persistent Cough ? Secondary to post nasal drip  Check cxr   Plan  Begin Zyrtec 10mg  over the counter  At bedtime  For drainage  Begin Nasonex Nasal Spray 2 puffs daily  May use Mucinex DM Twice daily  As needed  Cough/congestion  Chest xray today  Please contact office for sooner follow up if symptoms do not improve or worsen or seek emergency care  Dr. Lenna Gilford is retiring from [place] and we will need to set you up with a new Primary Care provider.  Please let us know if you would like [place] to assist with this referral.
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
## 2013-09-03 NOTE — Progress Notes (Signed)
Quick Note:  LMOM TCB x1. ______ 

## 2013-09-07 NOTE — Progress Notes (Signed)
Quick Note:  Patient returned call. Advised of cxr results / recs as stated by TP. Pt verbalized understanding and denied any questions. ______ 

## 2014-02-18 ENCOUNTER — Telehealth: Payer: Self-pay | Admitting: Pulmonary Disease

## 2014-02-18 NOTE — Telephone Encounter (Signed)
Called and spoke with pt and he is aware of appt with SN on Monday at 10"30. Nothing further is needed

## 2014-02-21 ENCOUNTER — Ambulatory Visit (INDEPENDENT_AMBULATORY_CARE_PROVIDER_SITE_OTHER): Payer: Medicare Other | Admitting: Pulmonary Disease

## 2014-02-21 ENCOUNTER — Encounter: Payer: Self-pay | Admitting: Pulmonary Disease

## 2014-02-21 VITALS — BP 142/78 | HR 58 | Temp 99.1°F | Ht 74.0 in | Wt 248.8 lb

## 2014-02-21 DIAGNOSIS — J209 Acute bronchitis, unspecified: Secondary | ICD-10-CM

## 2014-02-21 DIAGNOSIS — Z86010 Personal history of colonic polyps: Secondary | ICD-10-CM

## 2014-02-21 DIAGNOSIS — K219 Gastro-esophageal reflux disease without esophagitis: Secondary | ICD-10-CM

## 2014-02-21 DIAGNOSIS — I10 Essential (primary) hypertension: Secondary | ICD-10-CM

## 2014-02-21 DIAGNOSIS — I872 Venous insufficiency (chronic) (peripheral): Secondary | ICD-10-CM

## 2014-02-21 DIAGNOSIS — M1990 Unspecified osteoarthritis, unspecified site: Secondary | ICD-10-CM

## 2014-02-21 DIAGNOSIS — J329 Chronic sinusitis, unspecified: Secondary | ICD-10-CM

## 2014-02-21 DIAGNOSIS — F4310 Post-traumatic stress disorder, unspecified: Secondary | ICD-10-CM

## 2014-02-21 DIAGNOSIS — F411 Generalized anxiety disorder: Secondary | ICD-10-CM

## 2014-02-21 DIAGNOSIS — R7309 Other abnormal glucose: Secondary | ICD-10-CM

## 2014-02-21 MED ORDER — AMOXICILLIN-POT CLAVULANATE 875-125 MG PO TABS
1.0000 | ORAL_TABLET | Freq: Two times a day (BID) | ORAL | Status: DC
Start: 2014-02-21 — End: 2014-02-21

## 2014-02-21 NOTE — Patient Instructions (Signed)
Today we updated your med list in our EPIC system...    Continue your current medications the same...  We wrote a new prescription for AUGMENTIN 875mg  to take twice daily for 10d to treat your sinusitis...    Remember to take the OTC ALIGN one daily while on the antibiotic...  You should also take the OTC MUCINEX- 600mg   Take 2 tabs twice daily yo loosen the thick phlegm...  Call for any questions or if we can be of service in any way.Marland KitchenMarland Kitchen
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
## 2014-05-04 ENCOUNTER — Ambulatory Visit (INDEPENDENT_AMBULATORY_CARE_PROVIDER_SITE_OTHER): Payer: Self-pay

## 2014-05-04 DIAGNOSIS — Z23 Encounter for immunization: Secondary | ICD-10-CM

## 2014-05-05 ENCOUNTER — Ambulatory Visit (INDEPENDENT_AMBULATORY_CARE_PROVIDER_SITE_OTHER): Payer: Medicare Other

## 2014-05-05 DIAGNOSIS — Z23 Encounter for immunization: Secondary | ICD-10-CM

## 2014-05-24 ENCOUNTER — Ambulatory Visit: Payer: Medicare Other | Admitting: Family

## 2014-06-14 ENCOUNTER — Ambulatory Visit (INDEPENDENT_AMBULATORY_CARE_PROVIDER_SITE_OTHER): Payer: Medicare Other | Admitting: Internal Medicine

## 2014-06-14 ENCOUNTER — Encounter: Payer: Self-pay | Admitting: Internal Medicine

## 2014-06-14 VITALS — BP 118/78 | HR 62 | Temp 98.2°F | Resp 18 | Ht 74.0 in | Wt 241.8 lb

## 2014-06-14 DIAGNOSIS — K219 Gastro-esophageal reflux disease without esophagitis: Secondary | ICD-10-CM

## 2014-06-14 DIAGNOSIS — K648 Other hemorrhoids: Secondary | ICD-10-CM

## 2014-06-14 DIAGNOSIS — R7301 Impaired fasting glucose: Secondary | ICD-10-CM

## 2014-06-14 MED ORDER — HYDROCORTISONE 2.5 % EX CREA: TOPICAL_CREAM | Freq: Two times a day (BID) | CUTANEOUS | Status: DC

## 2014-06-14 NOTE — Progress Notes (Signed)
Pre visit review using our clinic review tool, if applicable. No additional management support is needed unless otherwise documented below in the visit note. 

## 2014-06-14 NOTE — Patient Instructions (Addendum)
We have sent in a medicine for your hemorrhoids that you can use twice a day for the next 1-2 weeks.  We will see you back next year for your physical. Please feel free to call our office with any problems or questions before then.  Health Maintenance A healthy lifestyle and preventative care can promote health and wellness.  Maintain regular health, dental, and eye exams.  Eat a healthy diet. Foods like vegetables, fruits, whole grains, low-fat dairy products, and lean protein foods contain the nutrients you need and are low in calories. Decrease your intake of foods high in solid fats, added sugars, and salt. Get information about a proper diet from your health care provider, if necessary.  Regular physical exercise is one of the most important things you can do for your health. Most adults should get at least 150 minutes of moderate-intensity exercise (any activity that increases your heart rate and causes you to sweat) each week. In addition, most adults need muscle-strengthening exercises on 2 or more days a week.   Maintain a healthy weight. The body mass index (BMI) is a screening tool to identify possible weight problems. It provides an estimate of body fat based on height and weight. Your health care provider can find your BMI and can help you achieve or maintain a healthy weight. For males 20 years and older:  A BMI below 18.5 is considered underweight.  A BMI of 18.5 to 24.9 is normal.  A BMI of 25 to 29.9 is considered overweight.  A BMI of 30 and above is considered obese.  Maintain normal blood lipids and cholesterol by exercising and minimizing your intake of saturated fat. Eat a balanced diet with plenty of fruits and vegetables. Blood tests for lipids and cholesterol should begin at age 63 and be repeated every 5 years. If your lipid or cholesterol levels are high, you are over age 59, or you are at high risk for heart disease, you may need your cholesterol levels checked  more frequently.Ongoing high lipid and cholesterol levels should be treated with medicines if diet and exercise are not working.  If you smoke, find out from your health care provider how to quit. If you do not use tobacco, do not start.  Lung cancer screening is recommended for adults aged 74-80 years who are at high risk for developing lung cancer because of a history of smoking. A yearly low-dose CT scan of the lungs is recommended for people who have at least a 30-pack-year history of smoking and are current smokers or have quit within the past 15 years. A pack year of smoking is smoking an average of 1 pack of cigarettes a day for 1 year (for example, a 30-pack-year history of smoking could mean smoking 1 pack a day for 30 years or 2 packs a day for 15 years). Yearly screening should continue until the smoker has stopped smoking for at least 15 years. Yearly screening should be stopped for people who develop a health problem that would prevent them from having lung cancer treatment.  If you choose to drink alcohol, do not have more than 2 drinks per day. One drink is considered to be 12 oz (360 mL) of beer, 5 oz (150 mL) of wine, or 1.5 oz (45 mL) of liquor.  Avoid the use of street drugs. Do not share needles with anyone. Ask for help if you need support or instructions about stopping the use of drugs.  High blood pressure causes heart  disease and increases the risk of stroke. Blood pressure should be checked at least every 1-2 years. Ongoing high blood pressure should be treated with medicines if weight loss and exercise are not effective.  If you are 53-34 years old, ask your health care provider if you should take aspirin to prevent heart disease.  Diabetes screening involves taking a blood sample to check your fasting blood sugar level. This should be done once every 3 years after age 36 if you are at a normal weight and without risk factors for diabetes. Testing should be considered at a  younger age or be carried out more frequently if you are overweight and have at least 1 risk factor for diabetes.  Colorectal cancer can be detected and often prevented. Most routine colorectal cancer screening begins at the age of 92 and continues through age 46. However, your health care provider may recommend screening at an earlier age if you have risk factors for colon cancer. On a yearly basis, your health care provider may provide home test kits to check for hidden blood in the stool. A small camera at the end of a tube may be used to directly examine the colon (sigmoidoscopy or colonoscopy) to detect the earliest forms of colorectal cancer. Talk to your health care provider about this at age 64 when routine screening begins. A direct exam of the colon should be repeated every 5-10 years through age 66, unless early forms of precancerous polyps or small growths are found.  People who are at an increased risk for hepatitis B should be screened for this virus. You are considered at high risk for hepatitis B if:  You were born in a country where hepatitis B occurs often. Talk with your health care provider about which countries are considered high risk.  Your parents were born in a high-risk country and you have not received a shot to protect against hepatitis B (hepatitis B vaccine).  You have HIV or AIDS.  You use needles to inject street drugs.  You live with, or have sex with, someone who has hepatitis B.  You are a man who has sex with other men (MSM).  You get hemodialysis treatment.  You take certain medicines for conditions like cancer, organ transplantation, and autoimmune conditions.  Hepatitis C blood testing is recommended for all people born from [birth year] through 1966 and any individual with known risk factors for hepatitis C.  Healthy men should no longer receive prostate-specific antigen (PSA) blood tests as part of routine cancer screening. Talk to your health care provider  about prostate cancer screening.  Testicular cancer screening is not recommended for adolescents or adult males who have no symptoms. Screening includes self-exam, a health care provider exam, and other screening tests. Consult with your health care provider about any symptoms you have or any concerns you have about testicular cancer.  Practice safe sex. Use condoms and avoid high-risk sexual practices to reduce the spread of sexually transmitted infections (STIs).  You should be screened for STIs, including gonorrhea and chlamydia if:  You are sexually active and are younger than 24 years.  You are older than 24 years, and your health care provider tells you that you are at risk for this type of infection.  Your sexual activity has changed since you were last screened, and you are at an increased risk for chlamydia or gonorrhea. Ask your health care provider if you are at risk.  If you are at risk of being infected  with HIV, it is recommended that you take a prescription medicine daily to prevent HIV infection. This is called pre-exposure prophylaxis (PrEP). You are considered at risk if:  You are a man who has sex with other men (MSM).  You are a heterosexual man who is sexually active with multiple partners.  You take drugs by injection.  You are sexually active with a partner who has HIV.  Talk with your health care provider about whether you are at high risk of being infected with HIV. If you choose to begin PrEP, you should first be tested for HIV. You should then be tested every 3 months for as long as you are taking PrEP.  Use sunscreen. Apply sunscreen liberally and repeatedly throughout the day. You should seek shade when your shadow is shorter than you. Protect yourself by wearing long sleeves, pants, a wide-brimmed hat, and sunglasses year round whenever you are outdoors.  Tell your health care provider of new moles or changes in moles, especially if there is a change in shape  or color. Also, tell your health care provider if a mole is larger than the size of a pencil eraser.  A one-time screening for abdominal aortic aneurysm (AAA) and surgical repair of large AAAs by ultrasound is recommended for men aged 16-75 years who are current or former smokers.  Stay current with your vaccines (immunizations). Document Released: 12/14/2007 Document Revised: 06/22/2013 Document Reviewed: 11/12/2010 [facility] Patient Information 2015 [place], [place]. This information is not intended to replace advice given to you by your health care provider. Make sure you discuss any questions you have with your health care provider.

## 2014-06-15 DIAGNOSIS — K649 Unspecified hemorrhoids: Secondary | ICD-10-CM | POA: Insufficient documentation

## 2014-06-15 NOTE — Assessment & Plan Note (Signed)
Patient states he is not currently having problems with acid reflux but if he does he would take an OTC medication.

## 2014-06-15 NOTE — Progress Notes (Signed)
   Subjective:    Patient ID: Shane [name], male    DOB: 1945-07-07, 68 y.o.   MRN: [id number]  HPI The patient is a 68 year old man who comes in today to establish care. Has a past medical history of impaired fasting glucose, GERD. He does get his blood work done at the [place] [facility] where he would pick up medications if he took any. He states he had an acute bout with some diarrhea about several months ago which lasted about 2 days. He did stay well-hydrated and the diarrhea cleared within 2 days, he did not note any blood in the diarrhea. He states since that time his hemorrhoids have been your dictated and they are resolving however there still little bit itchy and red. Otherwise he has no complaints including chest pain, shortness of breath, abdominal pain, leg pain or swelling, balance problems or falls. He is able to do everything for himself at home.  Review of Systems  Constitutional: Negative for fever, activity change, appetite change, fatigue and unexpected weight change.  HENT: Negative.   Respiratory: Negative for cough, chest tightness, shortness of breath and wheezing.   Cardiovascular: Negative for chest pain, palpitations and leg swelling.  Gastrointestinal: Positive for rectal pain. Negative for abdominal pain, diarrhea, constipation, abdominal distention and anal bleeding.       Itching, only mild pain  Genitourinary: Negative.   Musculoskeletal: Negative for back pain, arthralgias and gait problem.  Skin: Negative.   Neurological: Negative.       Objective:   Physical Exam  Constitutional: He is oriented to person, place, and time. He appears well-developed and well-nourished.  HENT:  Head: Normocephalic and atraumatic.  Eyes: EOM are normal.  Neck: Normal range of motion.  Cardiovascular: Normal rate and regular rhythm.   No murmur heard. Pulmonary/Chest: Effort normal and breath sounds normal. No respiratory distress. He has no wheezes. He has no rales.    Abdominal: Soft. Bowel sounds are normal. He exhibits no distension. There is no tenderness. There is no rebound.  Neurological: He is alert and oriented to person, place, and time. Coordination normal.  Skin: Skin is warm and dry.   Filed Vitals:   06/14/14 1531  BP: 118/78  Pulse: 62  Temp: 98.2 F (36.8 C)  TempSrc: Oral  Resp: 18  Height: 6\' 2"  (1.88 m)  Weight: 241 lb 12.8 oz (109.68 kg)  SpO2: 97%      Assessment & Plan:

## 2014-06-15 NOTE — Assessment & Plan Note (Signed)
Prescribed hydrocortisone 2.5% rectal cream. Advised to use twice a day for the next 2 weeks while his hemorrhoids are flared.

## 2014-06-15 NOTE — Assessment & Plan Note (Signed)
Last known hemoglobin A1c 6.0. Will have him forward his records from the [place] as he is recently gotten lab work from there. Reminded him of the importance of exercise in his daily life.

## 2015-02-12 ENCOUNTER — Encounter (HOSPITAL_COMMUNITY): Payer: Self-pay | Admitting: Emergency Medicine

## 2015-02-12 ENCOUNTER — Emergency Department (HOSPITAL_COMMUNITY): Payer: PPO

## 2015-02-12 ENCOUNTER — Emergency Department (HOSPITAL_COMMUNITY)
Admission: EM | Admit: 2015-02-12 | Discharge: 2015-02-12 | Disposition: A | Discharge status: Home / Self Care | Payer: PPO | Attending: Emergency Medicine | Admitting: Emergency Medicine

## 2015-02-12 DIAGNOSIS — Z862 Personal history of diseases of the blood and blood-forming organs and certain disorders involving the immune mechanism: Secondary | ICD-10-CM | POA: Diagnosis not present

## 2015-02-12 DIAGNOSIS — Z8719 Personal history of other diseases of the digestive system: Secondary | ICD-10-CM | POA: Insufficient documentation

## 2015-02-12 DIAGNOSIS — Z79899 Other long term (current) drug therapy: Secondary | ICD-10-CM | POA: Diagnosis not present

## 2015-02-12 DIAGNOSIS — Z87891 Personal history of nicotine dependence: Secondary | ICD-10-CM | POA: Diagnosis not present

## 2015-02-12 DIAGNOSIS — R61 Generalized hyperhidrosis: Secondary | ICD-10-CM | POA: Diagnosis not present

## 2015-02-12 DIAGNOSIS — Z8659 Personal history of other mental and behavioral disorders: Secondary | ICD-10-CM | POA: Insufficient documentation

## 2015-02-12 LAB — I-STAT CHEM 8, ED
BUN: 21 mg/dL — ABNORMAL HIGH (ref 6–20)
Calcium, Ion: 1.18 mmol/L (ref 1.13–1.30)
Chloride: 103 mmol/L (ref 101–111)
Creatinine, Ser: 1.2 mg/dL (ref 0.61–1.24)
Glucose, Bld: 93 mg/dL (ref 65–99)
HCT: 41 % (ref 39.0–52.0)
Hemoglobin: 13.9 g/dL (ref 13.0–17.0)
POTASSIUM: 3.8 mmol/L (ref 3.5–5.1)
Sodium: 142 mmol/L (ref 135–145)
TCO2: 25 mmol/L (ref 0–100)

## 2015-02-12 LAB — I-STAT TROPONIN, ED: Troponin i, poc: 0 ng/mL (ref 0.00–0.08)

## 2015-02-12 NOTE — ED Provider Notes (Signed)
TIME SEEN: 5:00 AM  CHIEF COMPLAINT: diaphoresis  HPI: Pt is a 69 y.o. male with history of hypertension, diabetes who presents today and emergency department with an episode of diaphoresis that started around 10 PM. States that he has had a cough with some sputum production for the past 3 days. He took dextromethorphan and guaifenesin sometime last evening and then at 10 PM developed diaphoresis and felt "gassy". States he was passing gas but denies any burping, chest pain, chest discomfort, abdominal pain or discomfort, back pain. No shortness of breath. Denies nausea or vomiting. States he has taken this medication before and has never had the symptoms.  ROS: See HPI Constitutional: no fever  Eyes: no drainage  ENT: no runny nose   Cardiovascular:  no chest pain  Resp: no SOB  GI: no vomiting GU: no dysuria Integumentary: no rash  Allergy: no hives  Musculoskeletal: no leg swelling  Neurological: no slurred speech ROS otherwise negative  PAST MEDICAL HISTORY/PAST SURGICAL HISTORY:  Past Medical History  Diagnosis Date  . History of sinusitis   . Acute asthmatic bronchitis   . Borderline hypertension   . Chest wall pain   . Borderline diabetes mellitus   . GERD (gastroesophageal reflux disease)   . Degenerative joint disease   . History of headache   . Anxiety disorder   . Posttraumatic stress disorder   . Anemia     MEDICATIONS:  Prior to Admission medications   Medication Sig Start Date End Date Taking? Authorizing Provider  cetirizine (ZYRTEC) 10 MG chewable tablet Chew 10 mg by mouth daily.    Historical Provider, MD  guaiFENesin (MUCINEX) 600 MG 12 hr tablet Take 1,200 mg by mouth 2 (two) times daily.    Historical Provider, MD  Homeopathic Products [facility] DRY EYE RELIEF) SOLN Place 1-2 drops into both eyes 2 (two) times daily as needed.    Historical Provider, MD  hydrocortisone 2.5 % cream Apply topically 2 (two) times daily. 06/14/14   Olga Millers, MD   ketoconazole (NIZORAL) 2 % shampoo Apply topically as directed.    Historical Provider, MD  mometasone (NASONEX) 50 MCG/ACT nasal spray Place 2 sprays into the nose daily. 09/03/13   Melvenia Needles, NP    ALLERGIES:  No Known Allergies  SOCIAL HISTORY:  Social History  Substance Use Topics  . Smoking status: Former Smoker -- 0.50 packs/day for 25 years    Types: Cigarettes    Quit date: 07/02/1983  . Smokeless tobacco: Never Used  . Alcohol Use: No    FAMILY HISTORY: Family History  Problem Relation Age of Onset  . Diabetes Father   . Arthritis Mother   . Colon cancer Neg Hx   . Colon polyps Neg Hx   . Rectal cancer Neg Hx   . Stomach cancer Neg Hx     EXAM: BP 151/82 mmHg  Pulse 53  Temp(Src) 98.5 F (36.9 C) (Oral)  Resp 12  Ht 6\' 2"  (1.88 m)  Wt 251 lb 8 oz (114.08 kg)  BMI 32.28 kg/m2  SpO2 97% CONSTITUTIONAL: Alert and oriented and responds appropriately to questions. Well-appearing; well-nourished, elderly, in no distress, asymptomatic HEAD: Normocephalic EYES: Conjunctivae clear, PERRL ENT: normal nose; no rhinorrhea; moist mucous membranes; pharynx without lesions noted NECK: Supple, no meningismus, no LAD  CARD: RRR; S1 and S2 appreciated; no murmurs, no clicks, no rubs, no gallops RESP: Normal chest excursion without splinting or tachypnea; breath sounds clear and equal bilaterally; no wheezes,  no rhonchi, no rales, no hypoxia or respiratory distress, speaking full sentences ABD/GI: Normal bowel sounds; non-distended; soft, non-tender, no rebound, no guarding, no peritoneal signs BACK:  The back appears normal and is non-tender to palpation, there is no CVA tenderness EXT: Normal ROM in all joints; non-tender to palpation; no edema; normal capillary refill; no cyanosis, no calf tenderness or swelling    SKIN: Normal color for age and race; warm NEURO: Moves all extremities equally, sensation to light touch intact diffusely, cranial nerves II through XII  intact PSYCH: The patient's mood and manner are appropriate. Grooming and personal hygiene are appropriate.  MEDICAL DECISION MAKING: Patient here with episode of diaphoresis that occurred at 10 PM. He states feeling "gassy" at that time but denies chest pain, shortness of breath.  Patient is currently asymptomatic, hemodynamically stable. EKG shows no ischemic changes or arrhythmia.patient's basic labs are unremarkable including negative troponin. Chest x-ray shows no infiltrate or edema. Patient is still asymptomatic. He now reports that he took this medication just prior to developing the symptoms and thinks that he may have taken too much. Have advised him that dextromethorphan may not be the appropriate medication for him in the future. He has not been coughing in the emergency department and I do not feel he needs any cough suppressant at this time. Have discussed usual and customary return precautions and have recommended outpatient follow-up. He verbalizes understanding and is comfortable with this plan.      EKG Interpretation  Date/Time:  Sunday February 12 2015 05:19:01 EDT Ventricular Rate:  50 PR Interval:  214 QRS Duration: 86 QT Interval:  472 QTC Calculation: 430 R Axis:   26 Text Interpretation:  Sinus rhythm Atrial premature complex Borderline prolonged PR interval Abnormal R-wave progression, early transition No old tracing to compare Confirmed by Sabel Hornbeck,  DO, Brieanna Nau [phone]) on 02/12/2015 5:32:43 AM        Brooke, DO 02/12/15 [id number]

## 2015-02-12 NOTE — ED Notes (Signed)
Pt. Left with all belongings and refused wheelchair. Discharge instructions were reviewed and all questions were answered.  

## 2015-02-12 NOTE — Discharge Instructions (Signed)
Diaphoresis Sweating is controlled by our nervous system. Sweat glands are found in the skin throughout the body. They exist in higher numbers in the skin of the hands, feet, armpits, and the genital region. Sweating occurs normally when the temperature of your body goes up. Diaphoresis means profuse sweating or perspiration due to an underlying medical condition or an external factor (such as medicines). Hyperhidrosis means excessive sweating that is not usually due to an underlying medical condition, on areas such as the palms, soles, or armpits. Other areas of the body may also be affected. Hyperhidrosis usually begins in childhood or early adolescence. It increases in severity through puberty and into adulthood. Sweaty palms are the most common problem and the most bothersome to people who have hyperhidrosis. CAUSES  Sweating is normally seen with exercise or being in hot surroundings. Not sweating in these conditions would be harmful. Stressful situations can also cause sweating. In some people, stimulation of the sweat glands during stress is overactive. Talking to strangers or shaking someone's hand can produce profuse sweating. Causes of sweating include:  Emotional upset.  Low blood pressure.  Low blood sugar.  Heart problems.  Low blood cell counts.  Certain pain relieving medicines.  Exercise.  Alcohol.  Infection.  Caffeine.  Spicy foods.  Hot flashes.  Overactive thyroid.  Illegal drug use, such as cocaine and amphetamine.  Use of medicines that stimulate parts of the nervous system.  A tumor (pheochromocytoma).  Withdrawal from some medicines or alcohol. DIAGNOSIS  Your caregiver needs to be consulted to make sure excessive sweating is not caused by another condition. Further testing may need to be done. TREATMENT   When hyperhidrosis is caused by another condition, that condition should be treated.  If menopause is the cause, you may wish to talk to your  caregiver about estrogen replacement.  If the hyperhidrosis is a natural happening of the way your body works, certain antiperspirants may help.  If medicines do not work, injections of botulinum toxin type A are sometimes used for underarm sweating.  Your caregiver can usually help you with this problem. Document Released: 01/07/2005 Document Revised: 09/09/2011 Document Reviewed: 07/25/2008 [employer] Patient Information 2015 [employer]. This information is not intended to replace advice given to you by your health care provider. Make sure you discuss any questions you have with your health care provider.  

## 2015-02-12 NOTE — ED Notes (Signed)
No neuro deficits in triage, ambulatory, and oriented.

## 2015-02-12 NOTE — ED Notes (Signed)
Patient here with complaint of possible medication reaction. States that he has been taking OTC cough medicine for URI. Tonight after taking his dose he "felt hot", lightheaded, and "gassy". States symptoms lasted a short period of time and have subsided at this time.

## 2015-02-16 ENCOUNTER — Telehealth: Payer: Self-pay

## 2015-02-16 NOTE — Telephone Encounter (Signed)
Spoke to the patient to education on AWV;  STated he could come in on the 25th 2pm/

## 2015-02-21 ENCOUNTER — Encounter: Payer: Self-pay | Admitting: Pulmonary Disease

## 2015-02-21 ENCOUNTER — Ambulatory Visit (INDEPENDENT_AMBULATORY_CARE_PROVIDER_SITE_OTHER): Payer: PPO | Admitting: Pulmonary Disease

## 2015-02-21 VITALS — BP 134/68 | HR 59 | Ht 74.0 in | Wt 249.6 lb

## 2015-02-21 DIAGNOSIS — F4310 Post-traumatic stress disorder, unspecified: Secondary | ICD-10-CM

## 2015-02-21 DIAGNOSIS — T50905A Adverse effect of unspecified drugs, medicaments and biological substances, initial encounter: Secondary | ICD-10-CM

## 2015-02-21 DIAGNOSIS — J4520 Mild intermittent asthma, uncomplicated: Secondary | ICD-10-CM | POA: Diagnosis not present

## 2015-02-21 DIAGNOSIS — J45909 Unspecified asthma, uncomplicated: Secondary | ICD-10-CM | POA: Insufficient documentation

## 2015-02-21 NOTE — Patient Instructions (Signed)
Today we updated your med list in our EPIC system...    Continue your current medications the same...  Avoid the Dextromethorphan cough suppressants due to your recent reaction...  But it is ok to use the Guaifenesin mucolytic agents like plain old mucinex as needed...  Your breathing test today looked normal...  Call for any questions.Marland KitchenMarland Kitchen
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
## 2015-02-22 ENCOUNTER — Ambulatory Visit: Payer: PPO

## 2015-02-22 NOTE — Telephone Encounter (Signed)
Call to the patient as noted that he was not on my schedule. Shane [name] answered; STated that he had called and canceled the apt with me for today and would call in the next 2 weeks to reschedule. Stated I did not get this message; He is coming in to see Dr.Kollar on 9/1 and he can come in 45 min early to complete on that day or call me to confirm apt at a later time. Re-educated on [place].

## 2015-03-02 ENCOUNTER — Ambulatory Visit (INDEPENDENT_AMBULATORY_CARE_PROVIDER_SITE_OTHER): Payer: PPO | Admitting: Internal Medicine

## 2015-03-02 VITALS — BP 120/82 | HR 66 | Temp 98.4°F | Resp 12 | Ht 74.0 in | Wt 254.0 lb

## 2015-03-02 DIAGNOSIS — Z23 Encounter for immunization: Secondary | ICD-10-CM

## 2015-03-02 DIAGNOSIS — R0789 Other chest pain: Secondary | ICD-10-CM | POA: Diagnosis not present

## 2015-03-02 NOTE — Progress Notes (Signed)
   Subjective:    Patient ID: Shane [name], male    DOB: [birth year], 69 y.o.   MRN: [id number]  HPI The patient is a 69 YO man who is coming in for ER follow up (seen for diaphoretic and chest tightness associated with medication intake, EKG with several PACs). He has since stayed away from the medication but has been scared to do much exertion. 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 He has slow heartbeat and high BP in the ER. He is concerned about his heart and is worried that he will have the tightness again. He is a former smoker, no significant PMH or personal history of CAD.   Review of Systems  Constitutional: Negative for fever, activity change, appetite change, fatigue and unexpected weight change.  Respiratory: Positive for chest tightness. Negative for cough, shortness of breath and wheezing.   Cardiovascular: Negative for chest pain, palpitations and leg swelling.  Gastrointestinal: Negative for abdominal pain, diarrhea, constipation, abdominal distention and anal bleeding.  Musculoskeletal: Negative for back pain, arthralgias and gait problem.  Skin: Negative.   Neurological: Negative.       Objective:   Physical Exam  Constitutional: He is oriented to person, place, and time. He appears well-developed and well-nourished.  HENT:  Head: Normocephalic and atraumatic.  Eyes: EOM are normal.  Neck: Normal range of motion.  Cardiovascular: Normal rate and regular rhythm.   No murmur heard. Pulmonary/Chest: Effort normal and breath sounds normal. No respiratory distress. He has no wheezes. He has no rales.  Abdominal: Soft. Bowel sounds are normal. He exhibits no distension. There is no tenderness. There is no rebound.  Neurological: He is alert and oriented to person, place, and time. Coordination normal.  Skin: Skin is warm and dry.   Filed Vitals:   03/02/15 1529  BP: 120/82  Pulse: 66  Temp: 98.4 F (36.9  C)  TempSrc: Oral  Resp: 12  Height: 6\' 2"  (1.88 m)  Weight: 254 lb (115.214 kg)  SpO2: 98%      Assessment & Plan:

## 2015-03-02 NOTE — Assessment & Plan Note (Signed)
Was associated with medication intake. He is very concerned about his heart. Reassured him that PACs were normal and no other changes to his EKG (reviewed with him at visit). He is low risk for CAD and will rule out with stress test (ETT). Ordered today.

## 2015-03-02 NOTE — Patient Instructions (Signed)
We will get the exercise stress test to make sure that your heart is still strong.   They should call you within a week to get in for that.   Come back in about 6 months for a physical.

## 2015-03-02 NOTE — Progress Notes (Signed)
Pre visit review using our clinic review tool, if applicable. No additional management support is needed unless otherwise documented below in the visit note. 

## 2015-03-03 ENCOUNTER — Telehealth (HOSPITAL_COMMUNITY): Payer: Self-pay | Admitting: *Deleted

## 2015-03-09 ENCOUNTER — Telehealth (HOSPITAL_COMMUNITY): Payer: Self-pay

## 2015-03-09 NOTE — Telephone Encounter (Signed)
Encounter complete. 

## 2015-03-10 ENCOUNTER — Ambulatory Visit (HOSPITAL_COMMUNITY)
Admission: RE | Admit: 2015-03-10 | Discharge: 2015-03-10 | Disposition: A | Discharge status: Home / Self Care | Payer: PPO | Source: Ambulatory Visit | Attending: Internal Medicine | Admitting: Internal Medicine

## 2015-03-10 DIAGNOSIS — I493 Ventricular premature depolarization: Secondary | ICD-10-CM | POA: Diagnosis not present

## 2015-03-10 DIAGNOSIS — I491 Atrial premature depolarization: Secondary | ICD-10-CM | POA: Diagnosis not present

## 2015-03-10 DIAGNOSIS — R0789 Other chest pain: Secondary | ICD-10-CM | POA: Insufficient documentation

## 2015-03-10 LAB — EXERCISE TOLERANCE TEST
CHL CUP RESTING HR STRESS: 62 {beats}/min
CSEPED: 3 min
Estimated workload: 4.9 METS
Exercise duration (sec): 20 s
MPHR: 150 {beats}/min
Peak HR: 105 {beats}/min
Percent HR: 70 %
RPE: 16

## 2015-03-22 ENCOUNTER — Telehealth: Payer: Self-pay | Admitting: Internal Medicine

## 2015-03-22 NOTE — Telephone Encounter (Signed)
Patient is returning your call. Please call him back ASAP, as he states that he has been waiting for a long time.

## 2015-03-22 NOTE — Telephone Encounter (Signed)
Spoke with patient about stress test results.

## 2015-07-06 ENCOUNTER — Telehealth: Payer: Self-pay | Admitting: Pulmonary Disease

## 2015-07-06 MED ORDER — AMOXICILLIN-POT CLAVULANATE 875-125 MG PO TABS: 1.0000 | ORAL_TABLET | Freq: Two times a day (BID) | ORAL | Status: DC

## 2015-07-06 MED ORDER — METHYLPREDNISOLONE 4 MG PO TABS: ORAL_TABLET | ORAL | Status: DC

## 2015-07-06 NOTE — Telephone Encounter (Signed)
Per SN: Called in aug 875 mg 1 po BID #14 x 0 refills Medrol dose pak 4 mg 6 day pack take as directed #21 tabs ----  Called spoke with pt. Is aware of recs. RX's sent in. Nothing further needed

## 2015-07-06 NOTE — Telephone Encounter (Signed)
Called spoke with pt. He c/o prod cough (green phlem), fever of 99-100.0, chills, HA, runny nose, occas wheezing. He has been taking tylenol. No openings. Please advise SN thanks  No Known Allergies   Current Outpatient Prescriptions on File Prior to Visit  Medication Sig Dispense Refill  . cetirizine (ZYRTEC) 10 MG chewable tablet Chew 10 mg by mouth daily.    . Glycerin-Polysorbate 80 (REFRESH DRY EYE THERAPY OP) Apply to eye.    . Homeopathic Products (SIMILASAN DRY EYE RELIEF) SOLN Place 1-2 drops into both eyes 2 (two) times daily as needed.    . hydrocortisone 2.5 % cream Apply topically 2 (two) times daily. 30 g 2  . ketoconazole (NIZORAL) 2 % shampoo Apply topically as directed.     No current facility-administered medications on file prior to visit.

## 2015-07-10 ENCOUNTER — Telehealth: Payer: Self-pay | Admitting: Pulmonary Disease

## 2015-07-10 NOTE — Telephone Encounter (Signed)
Per SN:  Call in [place], use 43ml qid. Pt also needs to be worked in on [employer] schedule on Friday.   lmtcb X1 for pt

## 2015-07-10 NOTE — Telephone Encounter (Signed)
Called and spoke to pt. Pt states he is still taking the medrol and the Augmentin. Pt c/o very  sore throat, and persistent cough, pt states he had a few episodes of hemoptysis at Saturday 1.7.17. Pt states it was frank blood and was no bigger than a quarter. Pt states the blood was mixed with clear mucus. Pt denies f/c/s, CP/tightness, and SOB.   Dr. Lenna Gilford, please advise. Thanks.   No Known Allergies  Current Outpatient Prescriptions on File Prior to Visit  Medication Sig Dispense Refill  . amoxicillin-clavulanate (AUGMENTIN) 875-125 MG tablet Take 1 tablet by mouth 2 (two) times daily. 14 tablet 0  . cetirizine (ZYRTEC) 10 MG chewable tablet Chew 10 mg by mouth daily.    . Glycerin-Polysorbate 80 (REFRESH DRY EYE THERAPY OP) Apply to eye.    . Homeopathic Products (SIMILASAN DRY EYE RELIEF) SOLN Place 1-2 drops into both eyes 2 (two) times daily as needed.    . hydrocortisone 2.5 % cream Apply topically 2 (two) times daily. 30 g 2  . ketoconazole (NIZORAL) 2 % shampoo Apply topically as directed.    . methylPREDNISolone (MEDROL) 4 MG tablet 6 day pack take as directed 21 tablet 0   No current facility-administered medications on file prior to visit.

## 2015-07-11 MED ORDER — NYSTATIN 100000 UNIT/ML MT SUSP: 5.0000 mL | Freq: Four times a day (QID) | OROMUCOSAL | Status: DC

## 2015-07-11 MED ORDER — MAGIC MOUTHWASH: 5.0000 mL | Freq: Four times a day (QID) | ORAL | Status: DC

## 2015-07-11 NOTE — Telephone Encounter (Signed)
Per SN: Generic Nystatin #151mL swish and swallow 5cc qid.  SN is seeing another pt at 11:00, see if pt can come in at 10:30.  rx has been called in to pharmacy.   lmtcb X2 for pt to schedule an add-on for SN on Friday.

## 2015-07-11 NOTE — Telephone Encounter (Signed)
Attempted to call in prescription for Magic Mouthwash, they are requesting the ratios of what to mix.  They will not accept "Magic Mouthwash" as the prescription.  They said that they have to mix it themselves. SN - please advise. SN - please advise where to add patient to your schedule on Friday?

## 2015-07-12 NOTE — Telephone Encounter (Signed)
Pt aware of recs per SN  OV with SN on 07/14/15 at 10:30 am (CB to add to the schedule)

## 2015-07-12 NOTE — Telephone Encounter (Signed)
lmtcb x3 for pt. 

## 2015-07-12 NOTE — Telephone Encounter (Signed)
[phone] pt calling back

## 2015-07-14 ENCOUNTER — Ambulatory Visit (INDEPENDENT_AMBULATORY_CARE_PROVIDER_SITE_OTHER): Payer: PPO | Admitting: Pulmonary Disease

## 2015-07-14 VITALS — BP 130/80 | HR 60 | Temp 98.4°F | Ht 73.0 in | Wt 257.0 lb

## 2015-07-14 DIAGNOSIS — J4520 Mild intermittent asthma, uncomplicated: Secondary | ICD-10-CM

## 2015-07-14 MED ORDER — PREDNISONE 10 MG PO TABS: ORAL_TABLET | ORAL | Status: DC

## 2015-07-14 MED ORDER — METHYLPREDNISOLONE ACETATE 80 MG/ML IJ SUSP
80.0000 mg | Freq: Once | INTRAMUSCULAR | Status: AC
  Administered 2015-07-14: 80 mg via INTRAMUSCULAR

## 2015-07-14 NOTE — Patient Instructions (Signed)
Today we updated your med list in our EPIC system...    Continue your current medications the same...  For your persistent symptoms>    We gave you a Depo shot today to help get rid of the inflammation in your system...    Starting in the AM 07/15/2015-- take the PREDNISONE 10mg  tabs- one tab twice daily for 5 days...    Then decrease to 1 tab daily each AM for 5 days...    Then decrease to 1/2 tab daily each AM for 5 days...    Then descrease to 1/2 tab every other day til gone (1/2, 0, 1/2, 0, etc)...  Start on some OTC MUCINEX (plain mucinex in the blue box) 1200mg  twice daily     Drink plenty of fluids including hot chicken soap & hot tea w/ honey & lemon...  Rest at home, take Tylenol as needed.Marland KitchenMarland Kitchen

## 2015-07-15 ENCOUNTER — Encounter: Payer: Self-pay | Admitting: Pulmonary Disease
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
## 2015-08-07 ENCOUNTER — Encounter: Payer: Self-pay | Admitting: Internal Medicine

## 2015-08-31 ENCOUNTER — Telehealth: Payer: Self-pay | Admitting: Pulmonary Disease

## 2015-08-31 ENCOUNTER — Ambulatory Visit: Payer: PPO | Admitting: Internal Medicine

## 2015-08-31 MED ORDER — AZITHROMYCIN 250 MG PO TABS: ORAL_TABLET | ORAL | Status: AC

## 2015-08-31 MED ORDER — PREDNISONE 10 MG (21) PO TBPK: ORAL_TABLET | ORAL | Status: DC

## 2015-08-31 NOTE — Telephone Encounter (Addendum)
Per SN:  Zpack as prescribed #1 Prednisone Dose Pak 10mg  #1 as prescribed.  Pt is are of SN's recommendations. Rxs have been sent in. Nothing further was needed.

## 2015-08-31 NOTE — Telephone Encounter (Signed)
Spoke with pt, c/o "asthmatic bronchitis" symptoms- prod cough with green mucus, sob, chest tightness X2 days.   Denies fever, chest pain, chills. Pt has taken otc cough drops to help with s/s. Pt uses [facility] on [address].   Please call pt back at [phone] with recs.  SN please advise on recs.  Thanks!

## 2015-09-12 ENCOUNTER — Telehealth: Payer: Self-pay | Admitting: Pulmonary Disease

## 2015-09-12 NOTE — Telephone Encounter (Signed)
Spoke with the pt  He states that the pharmacy filled the pred we called in on 08/31/15, but they wrote 4 mg on the bottle  He states that they "scratched out" the 4 mg and wrote 10 mg  He wants to be sure that this is what we called in  I advised that this is what we sent 10 mg dose pack  He verbalized understanding and nothing further needed

## 2015-09-21 DIAGNOSIS — H6123 Impacted cerumen, bilateral: Secondary | ICD-10-CM | POA: Diagnosis not present

## 2015-09-21 DIAGNOSIS — J329 Chronic sinusitis, unspecified: Secondary | ICD-10-CM | POA: Diagnosis not present

## 2015-09-21 DIAGNOSIS — H608X3 Other otitis externa, bilateral: Secondary | ICD-10-CM | POA: Diagnosis not present

## 2015-09-26 DIAGNOSIS — N401 Enlarged prostate with lower urinary tract symptoms: Secondary | ICD-10-CM | POA: Diagnosis not present

## 2015-09-26 DIAGNOSIS — R351 Nocturia: Secondary | ICD-10-CM | POA: Diagnosis not present

## 2015-09-26 DIAGNOSIS — Z125 Encounter for screening for malignant neoplasm of prostate: Secondary | ICD-10-CM | POA: Diagnosis not present

## 2015-09-26 DIAGNOSIS — Z0000 Encounter for general adult medical examination without abnormal findings: Secondary | ICD-10-CM | POA: Diagnosis not present

## 2015-11-01 DIAGNOSIS — J329 Chronic sinusitis, unspecified: Secondary | ICD-10-CM | POA: Diagnosis not present

## 2015-11-01 DIAGNOSIS — J328 Other chronic sinusitis: Secondary | ICD-10-CM | POA: Diagnosis not present

## 2015-11-01 DIAGNOSIS — H608X3 Other otitis externa, bilateral: Secondary | ICD-10-CM | POA: Diagnosis not present

## 2015-11-23 ENCOUNTER — Encounter: Payer: Self-pay | Admitting: Acute Care

## 2015-11-23 ENCOUNTER — Ambulatory Visit (INDEPENDENT_AMBULATORY_CARE_PROVIDER_SITE_OTHER): Payer: PPO | Admitting: Internal Medicine

## 2015-11-23 ENCOUNTER — Ambulatory Visit (INDEPENDENT_AMBULATORY_CARE_PROVIDER_SITE_OTHER): Payer: PPO | Admitting: Acute Care

## 2015-11-23 ENCOUNTER — Encounter: Payer: Self-pay | Admitting: Internal Medicine

## 2015-11-23 ENCOUNTER — Other Ambulatory Visit (INDEPENDENT_AMBULATORY_CARE_PROVIDER_SITE_OTHER): Payer: PPO

## 2015-11-23 ENCOUNTER — Ambulatory Visit (INDEPENDENT_AMBULATORY_CARE_PROVIDER_SITE_OTHER)
Admission: RE | Admit: 2015-11-23 | Discharge: 2015-11-23 | Disposition: A | Discharge status: Home / Self Care | Payer: PPO | Source: Ambulatory Visit | Attending: Acute Care | Admitting: Acute Care

## 2015-11-23 VITALS — BP 138/80 | HR 71 | Temp 98.5°F | Resp 16 | Ht 74.0 in | Wt 257.0 lb

## 2015-11-23 VITALS — BP 142/74 | HR 74 | Ht 74.0 in | Wt 258.2 lb

## 2015-11-23 DIAGNOSIS — Z23 Encounter for immunization: Secondary | ICD-10-CM | POA: Diagnosis not present

## 2015-11-23 DIAGNOSIS — J4520 Mild intermittent asthma, uncomplicated: Secondary | ICD-10-CM | POA: Diagnosis not present

## 2015-11-23 DIAGNOSIS — R200 Anesthesia of skin: Secondary | ICD-10-CM

## 2015-11-23 DIAGNOSIS — R202 Paresthesia of skin: Principal | ICD-10-CM

## 2015-11-23 DIAGNOSIS — M25529 Pain in unspecified elbow: Secondary | ICD-10-CM | POA: Insufficient documentation

## 2015-11-23 DIAGNOSIS — R05 Cough: Secondary | ICD-10-CM | POA: Diagnosis not present

## 2015-11-23 LAB — VITAMIN B12: VITAMIN B 12: 399 pg/mL (ref 211–911)

## 2015-11-23 LAB — COMPREHENSIVE METABOLIC PANEL
ALBUMIN: 3.9 g/dL (ref 3.5–5.2)
ALT: 6 U/L (ref 0–53)
AST: 15 U/L (ref 0–37)
Alkaline Phosphatase: 64 U/L (ref 39–117)
BUN: 9 mg/dL (ref 6–23)
CHLORIDE: 105 meq/L (ref 96–112)
CO2: 29 meq/L (ref 19–32)
CREATININE: 1.01 mg/dL (ref 0.40–1.50)
Calcium: 9 mg/dL (ref 8.4–10.5)
GFR: 93.58 mL/min (ref >60.00)
Glucose, Bld: 99 mg/dL (ref 70–99)
POTASSIUM: 3.8 meq/L (ref 3.5–5.1)
SODIUM: 139 meq/L (ref 135–145)
Total Bilirubin: 0.7 mg/dL (ref 0.2–1.2)
Total Protein: 6.5 g/dL (ref 6.0–8.3)

## 2015-11-23 MED ORDER — PREDNISONE 10 MG PO TABS: ORAL_TABLET | ORAL | Status: DC

## 2015-11-23 MED ORDER — AMOXICILLIN-POT CLAVULANATE 875-125 MG PO TABS: 1.0000 | ORAL_TABLET | Freq: Two times a day (BID) | ORAL | Status: DC

## 2015-11-23 NOTE — Patient Instructions (Addendum)
We will check the labs today and call you back with the results. I have put below exercises to help with the pain. You can also try tylenol if needed.   Lateral Epicondylitis With Rehab Lateral epicondylitis involves inflammation and pain around the outer portion of the elbow. The pain is caused by inflammation of the tendons in the forearm that bring back (extend) the wrist. Lateral epicondylitis is also called tennis elbow, because it is very common in tennis players. However, it may occur in any individual who extends the wrist repetitively. If lateral epicondylitis is left untreated, it may become a chronic problem. SYMPTOMS   Pain, tenderness, and inflammation on the outer (lateral) side of the elbow.  Pain or weakness with gripping activities.  Pain that increases with wrist-twisting motions (playing tennis, using a screwdriver, opening a door or a jar).  Pain with lifting objects, including a coffee cup. CAUSES  Lateral epicondylitis is caused by inflammation of the tendons that extend the wrist. Causes of injury may include:  Repetitive stress and strain on the muscles and tendons that extend the wrist.  Sudden change in activity level or intensity.  Incorrect grip in racquet sports.  Incorrect grip size of racquet (often too large).  Incorrect hitting position or technique (usually backhand, leading with the elbow).  Using a racket that is too heavy. RISK INCREASES WITH:  Sports or occupations that require repetitive and/or strenuous forearm and wrist movements (tennis, squash, racquetball, carpentry).  Poor wrist and forearm strength and flexibility.  Failure to warm up properly before activity.  Resuming activity before healing, rehabilitation, and conditioning are complete. PREVENTION   Warm up and stretch properly before activity.  Maintain physical fitness:  Strength, flexibility, and endurance.  Cardiovascular fitness.  Wear and use properly fitted  equipment.  Learn and use proper technique and have a coach correct improper technique.  Wear a tennis elbow (counterforce) brace. PROGNOSIS  The course of this condition depends on the degree of the injury. If treated properly, acute cases (symptoms lasting less than 4 weeks) are often resolved in 2 to 6 weeks. Chronic (longer lasting cases) often resolve in 3 to 6 months but may require physical therapy. RELATED COMPLICATIONS   Frequently recurring symptoms, resulting in a chronic problem. Properly treating the problem the first time decreases frequency of recurrence.  Chronic inflammation, scarring tendon degeneration, and partial tendon tear, requiring surgery.  Delayed healing or resolution of symptoms. TREATMENT  Treatment first involves the use of ice and medicine to reduce pain and inflammation. Strengthening and stretching exercises may help reduce discomfort if performed regularly. These exercises may be performed at home if the condition is an acute injury. Chronic cases may require a referral to a physical therapist for evaluation and treatment. Your caregiver may advise a corticosteroid injection to help reduce inflammation. Rarely, surgery is needed. MEDICATION  If pain medicine is needed, nonsteroidal anti-inflammatory medicines (aspirin and ibuprofen), or other minor pain relievers (acetaminophen), are often advised.  Do not take pain medicine for 7 days before surgery.  Prescription pain relievers may be given, if your caregiver thinks they are needed. Use only as directed and only as much as you need.  Corticosteroid injections may be recommended. These injections should be reserved only for the most severe cases, because they can only be given a certain number of times. HEAT AND COLD  Cold treatment (icing) should be applied for 10 to 15 minutes every 2 to 3 hours for inflammation and pain, and  immediately after activity that aggravates your symptoms. Use ice packs or an  ice massage.  Heat treatment may be used before performing stretching and strengthening activities prescribed by your caregiver, physical therapist, or athletic trainer. Use a heat pack or a warm water soak. SEEK MEDICAL CARE IF: Symptoms get worse or do not improve in 2 weeks, despite treatment. EXERCISES  RANGE OF MOTION (ROM) AND STRETCHING EXERCISES - Epicondylitis, Lateral (Tennis Elbow) These exercises may help you when beginning to rehabilitate your injury. Your symptoms may go away with or without further involvement from your physician, physical therapist, or athletic trainer. While completing these exercises, remember:   Restoring tissue flexibility helps normal motion to return to the joints. This allows healthier, less painful movement and activity.  An effective stretch should be held for at least 30 seconds.  A stretch should never be painful. You should only feel a gentle lengthening or release in the stretched tissue. RANGE OF MOTION - Wrist Flexion, Active-Assisted  Extend your right / left elbow with your fingers pointing down.*  Gently pull the back of your hand towards you, until you feel a gentle stretch on the top of your forearm.  Hold this position for __________ seconds. Repeat __________ times. Complete this exercise __________ times per day.  *If directed by your physician, physical therapist or athletic trainer, complete this stretch with your elbow bent, rather than extended. RANGE OF MOTION - Wrist Extension, Active-Assisted  Extend your right / left elbow and turn your palm upwards.*  Gently pull your palm and fingertips back, so your wrist extends and your fingers point more toward the ground.  You should feel a gentle stretch on the inside of your forearm.  Hold this position for __________ seconds. Repeat __________ times. Complete this exercise __________ times per day. *If directed by your physician, physical therapist or athletic trainer, complete  this stretch with your elbow bent, rather than extended. STRETCH - Wrist Flexion  Place the back of your right / left hand on a tabletop, leaving your elbow slightly bent. Your fingers should point away from your body.  Gently press the back of your hand down onto the table by straightening your elbow. You should feel a stretch on the top of your forearm.  Hold this position for __________ seconds. Repeat __________ times. Complete this stretch __________ times per day.  STRETCH - Wrist Extension   Place your right / left fingertips on a tabletop, leaving your elbow slightly bent. Your fingers should point backwards.  Gently press your fingers and palm down onto the table by straightening your elbow. You should feel a stretch on the inside of your forearm.  Hold this position for __________ seconds. Repeat __________ times. Complete this stretch __________ times per day.  STRENGTHENING EXERCISES - Epicondylitis, Lateral (Tennis Elbow) These exercises may help you when beginning to rehabilitate your injury. They may resolve your symptoms with or without further involvement from your physician, physical therapist, or athletic trainer. While completing these exercises, remember:   Muscles can gain both the endurance and the strength needed for everyday activities through controlled exercises.  Complete these exercises as instructed by your physician, physical therapist or athletic trainer. Increase the resistance and repetitions only as guided.  You may experience muscle soreness or fatigue, but the pain or discomfort you are trying to eliminate should never worsen during these exercises. If this pain does get worse, stop and make sure you are following the directions exactly. If the pain is  still present after adjustments, discontinue the exercise until you can discuss the trouble with your caregiver. STRENGTH - Wrist Flexors  Sit with your right / left forearm palm-up and fully supported on  a table or countertop. Your elbow should be resting below the height of your shoulder. Allow your wrist to extend over the edge of the surface.  Loosely holding a __________ weight, or a piece of rubber exercise band or tubing, slowly curl your hand up toward your forearm.  Hold this position for __________ seconds. Slowly lower the wrist back to the starting position in a controlled manner. Repeat __________ times. Complete this exercise __________ times per day.  STRENGTH - Wrist Extensors  Sit with your right / left forearm palm-down and fully supported on a table or countertop. Your elbow should be resting below the height of your shoulder. Allow your wrist to extend over the edge of the surface.  Loosely holding a __________ weight, or a piece of rubber exercise band or tubing, slowly curl your hand up toward your forearm.  Hold this position for __________ seconds. Slowly lower the wrist back to the starting position in a controlled manner. Repeat __________ times. Complete this exercise __________ times per day.  STRENGTH - Ulnar Deviators  Stand with a ____________________ weight in your right / left hand, or sit while holding a rubber exercise band or tubing, with your healthy arm supported on a table or countertop.  Move your wrist, so that your pinkie travels toward your forearm and your thumb moves away from your forearm.  Hold this position for __________ seconds and then slowly lower the wrist back to the starting position. Repeat __________ times. Complete this exercise __________ times per day STRENGTH - Radial Deviators  Stand with a ____________________ weight in your right / left hand, or sit while holding a rubber exercise band or tubing, with your injured arm supported on a table or countertop.  Raise your hand upward in front of you or pull up on the rubber tubing.  Hold this position for __________ seconds and then slowly lower the wrist back to the starting  position. Repeat __________ times. Complete this exercise __________ times per day. STRENGTH - Forearm Supinators   Sit with your right / left forearm supported on a table, keeping your elbow below shoulder height. Rest your hand over the edge, palm down.  Gently grip a hammer or a soup ladle.  Without moving your elbow, slowly turn your palm and hand upward to a "thumbs-up" position.  Hold this position for __________ seconds. Slowly return to the starting position. Repeat __________ times. Complete this exercise __________ times per day.  STRENGTH - Forearm Pronators   Sit with your right / left forearm supported on a table, keeping your elbow below shoulder height. Rest your hand over the edge, palm up.  Gently grip a hammer or a soup ladle.  Without moving your elbow, slowly turn your palm and hand upward to a "thumbs-up" position.  Hold this position for __________ seconds. Slowly return to the starting position. Repeat __________ times. Complete this exercise __________ times per day.  STRENGTH - Grip  Grasp a tennis ball, a dense sponge, or a large, rolled sock in your hand.  Squeeze as hard as you can, without increasing any pain.  Hold this position for __________ seconds. Release your grip slowly. Repeat __________ times. Complete this exercise __________ times per day.  STRENGTH - Elbow Extensors, Isometric  Stand or sit upright, on a  firm surface. Place your right / left arm so that your palm faces your stomach, and it is at the height of your waist.  Place your opposite hand on the underside of your forearm. Gently push up as your right / left arm resists. Push as hard as you can with both arms, without causing any pain or movement at your right / left elbow. Hold this stationary position for __________ seconds. Gradually release the tension in both arms. Allow your muscles to relax completely before repeating.   This information is not intended to replace advice  given to you by your health care provider. Make sure you discuss any questions you have with your health care provider.   Document Released: 06/17/2005 Document Revised: 07/08/2014 Document Reviewed: 09/29/2008 Elsevier Interactive Patient Education [employer].

## 2015-11-23 NOTE — Patient Instructions (Signed)
It is nice to meet you today, We will send in a prescription for Prednisone taper; 10 mg tablets: 4 tabs x 2 days, 3 tabs x 2 days, 2 tabs x 2 days 1 tab x 2 days then stop. We will send in a prescription for Augmentin 875 mg two times daily x 10 days. You can take probiotic tablets like Alyne or Culturette.( Yellow and green box) Use your Nasal saline spray Use your Fluticasone 2 sprays in each nostril once daily Continue your Zyrtec CXR on the way out. We will call you with the results. Follow up with Dr. Lenna Gilford in 1 month. Please contact office for sooner follow up if symptoms do not improve or worsen or seek emergency care

## 2015-11-23 NOTE — Progress Notes (Signed)
Pre visit review using our clinic review tool, if applicable. No additional management support is needed unless otherwise documented below in the visit note. 

## 2015-11-23 NOTE — Progress Notes (Signed)
   Subjective:    Patient ID: Shane [name], male    DOB: [birth year], 70 y.o.   MRN: [id number]  HPI The patient is a 70 YO man coming in for acute visit for elbow pain. It started in his thumb and he thought that he may have a splinter. He never found one and the pain in his thumb faded. He now is having some sharp pains in the elbow and biceps muscle. Started about 1 month ago and he has not tried anything for it. They do not last a long time. No clear trigger. Nothing makes it better or worse. Also having some bronchitis he states but has visit with pulmonary so does not want to discuss.   Review of Systems  Constitutional: Negative for fever, activity change, appetite change, fatigue and unexpected weight change.  Respiratory: Positive for cough and shortness of breath. Negative for chest tightness and wheezing.   Cardiovascular: Negative for chest pain, palpitations and leg swelling.  Gastrointestinal:       Itching, only mild pain  Musculoskeletal: Positive for myalgias and arthralgias. Negative for back pain and gait problem.  Skin: Negative.   Neurological: Negative.       Objective:   Physical Exam  Constitutional: He is oriented to person, place, and time. He appears well-developed and well-nourished.  HENT:  Head: Normocephalic and atraumatic.  Eyes: EOM are normal.  Neck: Normal range of motion.  Cardiovascular: Normal rate and regular rhythm.   Pulmonary/Chest: Effort normal and breath sounds normal. No respiratory distress. He has no wheezes. He has no rales.  Some scattered congestion but otherwise clear and good airflow  Abdominal: Soft.  Musculoskeletal:  Some pain along the lateral elbow, mild tenderness in the biceps muscle, no pain in the thumb or on ROM of fingers or wrist.   Neurological: He is alert and oriented to person, place, and time. Coordination normal.  Skin: Skin is warm and dry.   Filed Vitals:   11/23/15 0933  BP: 142/98  Pulse: 71  Temp:  98.5 F (36.9 C)  TempSrc: Oral  Resp: 16  Height: 6\' 2"  (1.88 m)  Weight: 257 lb (116.574 kg)  SpO2: 98%      Assessment & Plan:  Prevnar 13 given at visit

## 2015-11-23 NOTE — Assessment & Plan Note (Signed)
Mild Exacerbation of asthmatic Bronchitis Plan: We will send in a prescription for Prednisone taper; 10 mg tablets: 4 tabs x 2 days, 3 tabs x 2 days, 2 tabs x 2 days 1 tab x 2 days then stop. We will send in a prescription for Augmentin 875 mg two times daily x 10 days. You can take probiotic tablets like Alyne or Culturette.( Yellow and green box) Use your Nasal saline spray Use your Fluticasone 2 sprays in each nostril once daily Continue your Zyrtec CXR on the way out. We will call you with the results. Follow up with Dr. Lenna Gilford in 1 month. Please contact office for sooner follow up if symptoms do not improve or worsen or seek emergency care

## 2015-11-23 NOTE — Assessment & Plan Note (Signed)
Checking CMP and B12. Suspect lateral epicondylitis and given stretching exercises for rehab. Does not need any meds for it today.

## 2015-11-23 NOTE — Progress Notes (Signed)
History of Present Illness Shane [name] is a 70 y.o. male with asthmatic bronchitis followed by Dr. Lenna Gilford.   5/25/2017Acute Office Visit: Pt. Presents to the office with productive cough x 10 days with some associated shortness of breath. At onset he states he had fever, with wheezing which he states has improved. He had some kind of GI virus just prior to developing the upper airway problems.He has been using Zyrtec and Mucinex. He states he did have a one time episode of  Hemoptysis that he thinks was nasal drainage that he swallowed. It was a one time event.He denies chest pain, orthopnea, leg or calf pain. He states he is coughing up yellow green secretions and that he has a productive cough.He states he does not think he has a fever, but he is not sure.  Tests Spirometry 02/21/15 showed FVC=3.89 (89%), FEV1=3.09 (94%), %1sec=80, mid-flows wnl at 104% predicted  Past medical hx Past Medical History  Diagnosis Date  . History of sinusitis   . Acute asthmatic bronchitis   . Borderline hypertension   . Chest wall pain   . Borderline diabetes mellitus   . GERD (gastroesophageal reflux disease)   . Degenerative joint disease   . History of headache   . Anxiety disorder   . Posttraumatic stress disorder   . Anemia      Past surgical hx, Family hx, Social hx all reviewed.  Current Outpatient Prescriptions on File Prior to Visit  Medication Sig  . cetirizine (ZYRTEC) 10 MG chewable tablet Chew 10 mg by mouth daily. Reported on 07/14/2015  . hydrocortisone 2.5 % cream Apply topically 2 (two) times daily.  Marland Kitchen ketoconazole (NIZORAL) 2 % shampoo Apply topically as directed.  Marland Kitchen Propylene Glycol (SYSTANE BALANCE OP) Apply to eye.   No current facility-administered medications on file prior to visit.     No Known Allergies  Review Of Systems:  Constitutional:   No  weight loss, night sweats,  + Fevers, chills, fatigue, or  lassitude.  HEENT:   No headaches,  Difficulty  swallowing,  Tooth/dental problems, or  Sore throat,                No sneezing, itching, ear ache, + nasal congestion,+  post nasal drip,   CV:  No chest pain,  Orthopnea, PND, swelling in lower extremities, anasarca, dizziness, palpitations, syncope.   GI  No heartburn, indigestion, abdominal pain, nausea, vomiting, diarrhea, change in bowel habits, loss of appetite, bloody stools.   Resp: + shortness of breath with exertion not  at rest.  + excess mucus, + productive cough,  No non-productive cough,  One time  coughing up of blood tinged secretions, self resolved,.  + change in color of mucus.  + wheezing.  No chest wall deformity  Skin: no rash or lesions.  GU: no dysuria, change in color of urine, no urgency or frequency.  No flank pain, no hematuria   MS:  No joint pain or swelling.  No decreased range of motion.  No back pain.  Psych:  No change in mood or affect. No depression or anxiety.  No memory loss.   Vital Signs BP 142/74 mmHg  Pulse 74  Ht 6\' 2"  (1.88 m)  Wt 258 lb 3.2 oz (117.119 kg)  BMI 33.14 kg/m2  SpO2 96%   Physical Exam:  General- No distress,  A&Ox3, pleasant ENT: No sinus tenderness, TM clear, pale nasal mucosa, no oral exudate,no post nasal drip, no LAN Cardiac:  S1, S2, regular rate and rhythm, no murmur Chest: + wheeze/ rales/ dullness; no accessory muscle use, no nasal flaring, no sternal retractions Abd.: Soft Non-tender Ext: No clubbing cyanosis, edema Neuro:  normal strength Skin: No rashes, warm and dry Psych: normal mood and behavior   Assessment/Plan  Asthmatic bronchitis Mild Exacerbation of asthmatic Bronchitis Plan: We will send in a prescription for Prednisone taper; 10 mg tablets: 4 tabs x 2 days, 3 tabs x 2 days, 2 tabs x 2 days 1 tab x 2 days then stop. We will send in a prescription for Augmentin 875 mg two times daily x 10 days. You can take probiotic tablets like Alyne or Culturette.( Yellow and green box) Use your Nasal  saline spray Use your Fluticasone 2 sprays in each nostril once daily Continue your Zyrtec CXR on the way out. We will call you with the results. Follow up with Dr. Lenna Gilford in 1 month. Please contact office for sooner follow up if symptoms do not improve or worsen or seek emergency care        Magdalen Spatz, NP 11/23/2015  1:19 PM

## 2015-11-24 ENCOUNTER — Telehealth: Payer: Self-pay | Admitting: Acute Care

## 2015-11-24 NOTE — Telephone Encounter (Signed)
I attempted to call Shane [name] with his CXR results. There was no answer. I did leave a message requesting he call the office for results. Triage , if Shane [name] returns the call, please let him know his CXR  was normal, no pneumonia.Thanks so much.

## 2015-11-24 NOTE — Telephone Encounter (Signed)
Pt is aware of results. 

## 2015-12-19 ENCOUNTER — Encounter: Payer: Self-pay | Admitting: Pulmonary Disease

## 2015-12-19 ENCOUNTER — Ambulatory Visit (INDEPENDENT_AMBULATORY_CARE_PROVIDER_SITE_OTHER): Payer: PPO | Admitting: Pulmonary Disease

## 2015-12-19 VITALS — BP 128/82 | HR 58 | Temp 98.0°F | Ht 74.0 in | Wt 257.4 lb

## 2015-12-19 DIAGNOSIS — J4530 Mild persistent asthma, uncomplicated: Secondary | ICD-10-CM

## 2015-12-19 DIAGNOSIS — J324 Chronic pansinusitis: Secondary | ICD-10-CM | POA: Diagnosis not present

## 2015-12-19 DIAGNOSIS — J329 Chronic sinusitis, unspecified: Secondary | ICD-10-CM | POA: Insufficient documentation

## 2015-12-19 MED ORDER — METHYLPREDNISOLONE ACETATE 80 MG/ML IJ SUSP
80.0000 mg | Freq: Once | INTRAMUSCULAR | Status: AC
  Administered 2015-12-19: 80 mg via INTRAMUSCULAR

## 2015-12-19 MED ORDER — PREDNISONE 10 MG PO TABS: ORAL_TABLET | ORAL | Status: DC

## 2015-12-19 MED ORDER — LEVOFLOXACIN 500 MG PO TABS: 500.0000 mg | ORAL_TABLET | Freq: Every day | ORAL | Status: DC

## 2015-12-19 NOTE — Patient Instructions (Signed)
Today we updated your med list in our EPIC system...    Continue your current medications the same...  We decided to treat your upper respiratory tract infection w/     LEVAQUIN 500mg  tabs >> take one tab daily til gone...    PREDNISONE 20mg  tabs >>       Start w/ one tab twice daily for 4 days...       Then decrease to 1 tab each AM for 4 days...       Then decrease to 1/2 tab daily for 4 days...       Then decrease to 1/2 tab every other day til gone (1/2, 0, 1/2, 0, etc)...  Remember to use the OTC ALLEGRA 180mg - one tab daily... You may use the OTC FLONASE >> 2 sprays in each nostril twice daily... And the OTC MUCINEX 1200mg  twice daily w/ lots of fluids...  Call for any questions.Marland KitchenMarland Kitchen
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
## 2015-12-21 ENCOUNTER — Telehealth: Payer: Self-pay | Admitting: Pulmonary Disease

## 2015-12-21 NOTE — Telephone Encounter (Signed)
Called and spoke with pharmacy, they wanted instructions on Prednisone.  PREDNISONE 20mg  tabs >>  Start w/ one tab twice daily for 4 days...  Then decrease to 1 tab each AM for 4 days...  Then decrease to 1/2 tab daily for 4 days...  Then decrease to 1/2 tab every other day til gone (1/2, 0, 1/2, 0, etc)...  Nothing further needed.

## 2015-12-22 ENCOUNTER — Telehealth: Payer: Self-pay | Admitting: Pulmonary Disease

## 2015-12-22 MED ORDER — PREDNISONE 10 MG PO TABS: ORAL_TABLET | ORAL | Status: DC

## 2015-12-22 NOTE — Telephone Encounter (Signed)
Went to speak with patient in the lobby At the 6.20.17 visit with SN, pt was instrcuted to take prednisone 20mg  taper but 10mg  tabs were sent to the pharmacy.  Pt already began the taper yesterday and would like to know what he is to do now.  Spoke with SN: order another #20 tabs of the 10mg  and have pt combine the two bottles and restart the taper tomorrow at these directions>> 2tabs BID x4days, 2tabs QD x4days, 1 tab QD x4 days and 1 tab QOD x4 days till gone.  Went back to lobby and discussed instructions with patient and handed him written instructions as above.  Pt voiced his understanding and denied any further questions or concerns at this time.  Rx telephoned to pharmacy to [facility] Nothing further needed; will sign off

## 2015-12-22 NOTE — Telephone Encounter (Signed)
PATIENT IN LOBBY.  Prednisone RX sent in was different dosage than what was on the paperwork he received at his office visit.

## 2015-12-25 ENCOUNTER — Telehealth: Payer: Self-pay | Admitting: Pulmonary Disease

## 2015-12-25 NOTE — Telephone Encounter (Signed)
Pt just seen by SN on 6.20.17 Has upcoming appt scheduled with SN on 6.29.17 - per SN, this can be cancelled since pt was able to be seen earlier [facility] TCB x1 for pt to let him know NOT to keep the 6.29.17 appt with SN

## 2015-12-26 NOTE — Telephone Encounter (Signed)
Called spoke with pt. I explained the below to him. He voiced understanding and had no further questions. Ov canceled. Nothing further needed.

## 2015-12-26 NOTE — Telephone Encounter (Signed)
Pt returning jessicas call

## 2015-12-28 ENCOUNTER — Ambulatory Visit: Payer: PPO | Admitting: Pulmonary Disease

## 2016-01-12 ENCOUNTER — Ambulatory Visit (INDEPENDENT_AMBULATORY_CARE_PROVIDER_SITE_OTHER): Payer: PPO | Admitting: Internal Medicine

## 2016-01-12 ENCOUNTER — Encounter: Payer: Self-pay | Admitting: Internal Medicine

## 2016-01-12 VITALS — BP 112/78 | HR 61 | Temp 98.2°F | Wt 248.8 lb

## 2016-01-12 DIAGNOSIS — R030 Elevated blood-pressure reading, without diagnosis of hypertension: Secondary | ICD-10-CM | POA: Diagnosis not present

## 2016-01-12 NOTE — Progress Notes (Signed)
Pre visit review using our clinic review tool, if applicable. No additional management support is needed unless otherwise documented below in the visit note. 

## 2016-01-12 NOTE — Assessment & Plan Note (Signed)
BP normal today and on chart review only 1 elevated reading in last 3 years. He will chart the values on paper of the drug store readings and bring them back at next visit or call if consistently >150 or >90.

## 2016-01-12 NOTE — Patient Instructions (Signed)
Please start keeping a record of the blood pressure from the drug store and bring it to the next visit.   IF the numbers are >150 for the top number of >90 for the bottom number all the time call the office sooner.

## 2016-01-12 NOTE — Progress Notes (Signed)
   Subjective:    Patient ID: Shane [name], male    DOB: 1945-12-05, 70 y.o.   MRN: [id number]  HPI The patient is a 70 YO man coming in for high blood pressure at the [place]. Denies headache, chest pains, SOB, nausea, vomiting. He has been monitoring at the drug store but cannot recall the values. Sometimes they are different. He is not treated for high blood pressure and does not take medicine for blood pressure. Denies that he is currently taking NSAIDs or cold medicine.   Review of Systems  Constitutional: Negative for fever, activity change, appetite change, fatigue and unexpected weight change.  HENT: Negative.   Respiratory: Negative for cough, chest tightness, shortness of breath and wheezing.   Cardiovascular: Negative for chest pain, palpitations and leg swelling.  Gastrointestinal: Negative for abdominal pain, diarrhea, constipation, abdominal distention, anal bleeding and rectal pain.  Musculoskeletal: Negative for back pain, arthralgias and gait problem.  Skin: Negative.   Neurological: Negative.       Objective:   Physical Exam  Constitutional: He is oriented to person, place, and time. He appears well-developed and well-nourished.  HENT:  Head: Normocephalic and atraumatic.  Eyes: EOM are normal.  Neck: Normal range of motion.  Cardiovascular: Normal rate and regular rhythm.   Pulmonary/Chest: Effort normal and breath sounds normal. No respiratory distress. He has no wheezes. He has no rales.  Abdominal: Soft.  Neurological: He is alert and oriented to person, place, and time. Coordination normal.  Skin: Skin is warm and dry.   Filed Vitals:   01/12/16 1026  BP: 112/78  Pulse: 61  Temp: 98.2 F (36.8 C)  TempSrc: Oral  Weight: 248 lb 12.8 oz (112.855 kg)  SpO2: 97%      Assessment & Plan:

## 2016-02-06 ENCOUNTER — Ambulatory Visit (INDEPENDENT_AMBULATORY_CARE_PROVIDER_SITE_OTHER): Payer: PPO | Admitting: Allergy and Immunology

## 2016-02-06 ENCOUNTER — Encounter: Payer: Self-pay | Admitting: Allergy and Immunology

## 2016-02-06 ENCOUNTER — Encounter (INDEPENDENT_AMBULATORY_CARE_PROVIDER_SITE_OTHER): Payer: Self-pay

## 2016-02-06 VITALS — BP 124/64 | HR 76 | Temp 98.2°F | Resp 20 | Ht 72.0 in | Wt 255.0 lb

## 2016-02-06 DIAGNOSIS — B999 Unspecified infectious disease: Secondary | ICD-10-CM | POA: Diagnosis not present

## 2016-02-06 DIAGNOSIS — J309 Allergic rhinitis, unspecified: Secondary | ICD-10-CM | POA: Diagnosis not present

## 2016-02-06 DIAGNOSIS — J4540 Moderate persistent asthma, uncomplicated: Secondary | ICD-10-CM | POA: Diagnosis not present

## 2016-02-06 DIAGNOSIS — H1010 Acute atopic conjunctivitis, unspecified eye: Secondary | ICD-10-CM

## 2016-02-06 MED ORDER — MONTELUKAST SODIUM 10 MG PO TABS: 10.0000 mg | ORAL_TABLET | Freq: Every day | ORAL | 5 refills | Status: DC

## 2016-02-06 MED ORDER — FLUTICASONE FUROATE 200 MCG/ACT IN AEPB: 1.0000 | INHALATION_SPRAY | Freq: Every day | RESPIRATORY_TRACT | 5 refills | Status: DC

## 2016-02-06 MED ORDER — ALBUTEROL SULFATE HFA 108 (90 BASE) MCG/ACT IN AERS: 2.0000 | INHALATION_SPRAY | RESPIRATORY_TRACT | 5 refills | Status: DC | PRN

## 2016-02-06 NOTE — Patient Instructions (Addendum)
  1. Allergen avoidance measures  2. Blood - CBC w/diff, IgA/G/M, IgE, anti-pneumococcal antibody, antitetanus antibody  3. Treat and prevent inflammation:   A. fluticasone 2 sprays each nostril one time per day  B. montelukast 10 mg one tablet once a day  C. Arnuity 200 one inhalation 1 time per day. Sample. Free coupon  4. If needed:   A. nasal saline  B. OTC antihistamine  - Allegra    C. Proventil HFA 2 puffs every 4-6 hours  5. Return to clinic in 4 weeks or earlier if problem  6.  obtain fall flu vaccine

## 2016-02-06 NOTE — Progress Notes (Signed)
**Note Shane-Identified via Obfuscation** Dear Dr. Lenna Gilford,  Thank you for referring Shane [name] to the [facility] on 02/06/2016.   Below is a summation of this patient's evaluation and recommendations.  Thank you for your referral. I will keep you informed about this patient's response to treatment.   If you have any questions please to do hesitate to contact me.   Sincerely,  Jiles Prows, MD [facility]   ______________________________________________________________________    NEW PATIENT NOTE  Referring Provider: Noralee Space, MD Primary Provider: [name], MD Date of office visit: 02/06/2016    Subjective:   Chief Complaint:  Shane [name] (DOB: 31-Jul-1945) is a 70 y.o. male with a chief complaint of Sinusitis  who presents to the clinic on 02/06/2016 with the following problems:  HPI: Tevin presents to this clinic in evaluation of recurrent sinus infections. Apparently over the course of the past 4-6 months he's had recurrent sinus infections and recently saw Dr. Erik Obey who prescribed him 6 weeks of antibiotics apparently after obtaining a sinus CT scan which identified some abnormality in his sinuses. This has occurred while he consistently uses nasal fluticasone and some nasal saline spray. His sinusitis is basally manifested as lots of nasal congestion and postnasal drip and a cough. In addition, he gets "bronchitis" with intermittent wheezing and coughing about twice a year and he has had 2 episodes over the course the past 6 months. He is usually treated with systemic steroids for this event. There may have been a rescue inhaler given to him in the past but it does not sound as though he consistently uses a controller agent for asthma. He was a smoker cigarettes for 22 years which she discontinued in 1986  Past Medical History:  Diagnosis Date  . Acute asthmatic bronchitis   . Anemia   .  Anxiety disorder   . Borderline diabetes mellitus   . Borderline hypertension   . Chest wall pain   . Degenerative joint disease   . GERD (gastroesophageal reflux disease)   . History of headache   . History of sinusitis   . Posttraumatic stress disorder     Past Surgical History:  Procedure Laterality Date  . FINGER SURGERY Right    index  . NASAL SINUS SURGERY     x 2      Medication List      ALLEGRA ALLERGY 180 MG tablet Generic drug:  fexofenadine Take 180 mg by mouth daily.   fluticasone 50 MCG/ACT nasal spray Commonly known as:  FLONASE Place 2 sprays into both nostrils daily.   guaiFENesin 600 MG 12 hr tablet Commonly known as:  MUCINEX Take 1,200 mg by mouth 2 (two) times daily.   hydrocortisone 2.5 % cream Apply topically 2 (two) times daily.   ketoconazole 2 % shampoo Commonly known as:  NIZORAL Apply topically as directed.   SYSTANE BALANCE OP Apply to eye. Reported on 12/19/2015       No Known Allergies  Review of systems negative except as noted in HPI / PMHx or noted below:  Review of Systems  Constitutional: Negative.   HENT: Negative.   Eyes: Negative.   Respiratory: Negative.   Cardiovascular: Negative.   Gastrointestinal: Negative.   Genitourinary: Negative.   Musculoskeletal: Negative.   Skin: Negative.   Neurological: Negative.   Endo/Heme/Allergies: Negative.   Psychiatric/Behavioral: Negative.     Family History  Problem Relation Age of Onset  . Diabetes Father   . Arthritis Mother   . Colon cancer Neg Hx   . Colon polyps Neg Hx   . Rectal cancer Neg Hx   . Stomach cancer Neg Hx     Social History   Social History  . Marital status: Single    Spouse name: N/A  . Number of children: 1  . Years of education: N/A   Occupational History  . former Building control surveyor for [employer] History Main Topics  . Smoking status: Former Smoker    Packs/day: 0.50    Years: 25.00    Types: Cigarettes    Quit  date: 07/02/1983  . Smokeless tobacco: Never Used  . Alcohol use No  . Drug use: No  . Sexual activity: Not on file   Other Topics Concern  . Not on file   Social History Narrative  . No narrative on file    Environmental and Social history  Lives in a house with a dry environment, no animals located inside the household, carpeting in the bedroom, plastic on the bed and pillow, and no smokers located inside the household. He was a Building control surveyor up until 2010.   Objective:   Vitals:   02/06/16 1444  BP: 124/64  Pulse: 76  Resp: 20  Temp: 98.2 F (36.8 C)   Height: 6' (182.9 cm) Weight: 255 lb (115.7 kg)  Physical Exam  Constitutional: He is well-developed, well-nourished, and in no distress.  HENT:  Head: Normocephalic. Head is without right periorbital erythema and without left periorbital erythema.  Right Ear: Tympanic membrane, external ear and ear canal normal.  Left Ear: Tympanic membrane, external ear and ear canal normal.  Nose: Nose normal. No mucosal edema or rhinorrhea.  Mouth/Throat: Oropharynx is clear and moist and mucous membranes are normal. No oropharyngeal exudate.  Eyes: Conjunctivae and lids are normal. Pupils are equal, round, and reactive to light.  Neck: Trachea normal. No tracheal deviation present. No thyromegaly present.  Cardiovascular: Normal rate, regular rhythm, S1 normal, S2 normal and normal heart sounds.   No murmur heard. Pulmonary/Chest: Effort normal. No stridor. No tachypnea. No respiratory distress. He has no wheezes. He has no rales. He exhibits no tenderness.  Abdominal: Soft. He exhibits no distension and no mass. There is no hepatosplenomegaly. There is no tenderness. There is no rebound and no guarding.  Musculoskeletal: He exhibits no edema or tenderness.  Lymphadenopathy:       Head (right side): No tonsillar adenopathy present.       Head (left side): No tonsillar adenopathy present.    He has no cervical adenopathy.    He has no  axillary adenopathy.  Neurological: He is alert. Gait normal.  Skin: No rash noted. He is not diaphoretic. No erythema. No pallor. Nails show no clubbing.  Psychiatric: Mood and affect normal.     Diagnostics: Allergy skin tests were performed. He did not demonstrate any hypersensitivity against a screening panel of aeroallergens or foods  Spirometry was performed and demonstrated an FEV1 of 2.94 @ 97 % of predicted.Following the administration of nebulized albuterol his FEV1 did not improve.   Assessment and Plan:    1. Recurrent infections   2. Asthma, moderate persistent, well-controlled   3. Allergic rhinoconjunctivitis     1. Allergen avoidance measures?  2. Blood - CBC w/diff, IgA/G/M, IgE, anti-pneumococcal antibody, antitetanus antibody  3. Treat and prevent inflammation:  A. fluticasone 2 sprays each nostril one time per day  B. montelukast 10 mg one tablet once a day  C. Arnuity 200 one inhalation 1 time per day. Sample. Free coupon  4. If needed:   A. nasal saline  B. OTC antihistamine  - Allegra    C. Proventil HFA 2 puffs every 4-6 hours  5. Return to clinic in 4 weeks or earlier if problem  6.  obtain fall flu vaccine  I think we need to rule out the possibility that Devari has a less than vibrant immune system and will obtain the blood tests noted above in investigation of a possible B-cell defect. As well, I would like for him to use some anti-inflammatory agents for his entire respiratory tract and he'll use a combination of nasal fluticasone and inhaled fluticasone and montelukast. I'll regroup with him in approximately 4 weeks or so to make an assessment of his response to this approach.  Jiles Prows, MD [facility]

## 2016-02-09 DIAGNOSIS — B999 Unspecified infectious disease: Secondary | ICD-10-CM | POA: Diagnosis not present

## 2016-02-15 LAB — PNEUMOCOCCAL IM (14 SEROTYPE)
PNEUMO AB TYPE 26 (6B): 50.1 ug/mL (ref >1.3)
PNEUMO AB TYPE 3: 7 ug/mL (ref >1.3)
PNEUMO AB TYPE 4: 30.9 ug/mL (ref >1.3)
PNEUMO AB TYPE 68 (9V): 17.7 ug/mL (ref >1.3)
PNEUMO AB TYPE 8: 8.4 ug/mL (ref >1.3)
PNEUMO AB TYPE 9 (9N): 24.1 ug/mL (ref >1.3)
Pneumo Ab Type 1*: >6.6 ug/mL (ref >1.3)
Pneumo Ab Type 12 (12F)*: 1.4 ug/mL (ref >1.3)
Pneumo Ab Type 14*: 11.5 ug/mL (ref >1.3)
Pneumo Ab Type 19 (19F)*: >34.9 ug/mL (ref >1.3)
Pneumo Ab Type 23 (23F)*: >36 ug/mL (ref >1.3)
Pneumo Ab Type 51 (7F)*: >26.2 ug/mL (ref >1.3)
Pneumo Ab Type 56 (18C)*: >22.4 ug/mL (ref >1.3)

## 2016-02-15 LAB — CBC WITH DIFFERENTIAL/PLATELET
BASOS: 0 %
Basophils Absolute: 0 10*3/uL (ref 0.0–0.2)
EOS (ABSOLUTE): 0.2 10*3/uL (ref 0.0–0.4)
EOS: 5 %
HEMATOCRIT: 34.6 % — AB (ref 37.5–51.0)
Hemoglobin: 10.8 g/dL — ABNORMAL LOW (ref 12.6–17.7)
IMMATURE GRANS (ABS): 0 10*3/uL (ref 0.0–0.1)
IMMATURE GRANULOCYTES: 0 %
LYMPHS: 41 %
Lymphocytes Absolute: 1.6 10*3/uL (ref 0.7–3.1)
MCH: 19.7 pg — ABNORMAL LOW (ref 26.6–33.0)
MCHC: 31.2 g/dL — ABNORMAL LOW (ref 31.5–35.7)
MCV: 63 fL — AB (ref 79–97)
MONOS ABS: 0.5 10*3/uL (ref 0.1–0.9)
Monocytes: 12 %
NEUTROS PCT: 42 %
Neutrophils Absolute: 1.7 10*3/uL (ref 1.4–7.0)
PLATELETS: 151 10*3/uL (ref 150–379)
RBC: 5.49 x10E6/uL (ref 4.14–5.80)
RDW: 18.7 % — AB (ref 12.3–15.4)
WBC: 3.9 10*3/uL (ref 3.4–10.8)

## 2016-02-15 LAB — TETANUS ANTIBODY, IGG: TETANUS AB, IGG: 0.5 [IU]/mL (ref <0.10)

## 2016-02-15 LAB — IGG, IGA, IGM
IGA/IMMUNOGLOBULIN A, SERUM: 134 mg/dL (ref 61–437)
IGG (IMMUNOGLOBIN G), SERUM: 949 mg/dL (ref 700–1600)
IgM (Immunoglobulin M), Srm: 62 mg/dL (ref 15–143)

## 2016-02-15 LAB — IGE: IgE (Immunoglobulin E), Serum: 214 IU/mL — ABNORMAL HIGH (ref 0–100)

## 2016-03-07 ENCOUNTER — Telehealth: Payer: Self-pay

## 2016-03-07 NOTE — Telephone Encounter (Signed)
Pt. Calling in saying he is having sinus pressure with thick green drainage which started 2 weeks ago. Pt. States he has no cough or wheezing. Pt. States he saw you 2 to 3 weeks ago.

## 2016-03-08 MED ORDER — AMOXICILLIN-POT CLAVULANATE ER 1000-62.5 MG PO TB12: ORAL_TABLET | ORAL | 0 refills | Status: DC

## 2016-03-08 NOTE — Telephone Encounter (Signed)
Left message on pt.'s phone that we were calling in Augmentin ER 1000 mg to take twice daily for 10 days and if not any better to let us know otherwise continue his routine nasal regimen as outlined at last visit including saline rinses.

## 2016-03-08 NOTE — Telephone Encounter (Signed)
Please advise 

## 2016-03-08 NOTE — Telephone Encounter (Signed)
Reviewed his visit note.  He has history of recurrent sinusitis.  Appears with 2 weeks of symptoms he likely has another infection.  Please prescribe Augmentin ER 1000mg  bid x 10 days.  If he is not improved with this course he should let us know.    He should continue his routine nasal regimen as outlined at last visit including saline rinses.

## 2016-03-19 ENCOUNTER — Encounter: Payer: Self-pay | Admitting: Allergy and Immunology

## 2016-03-19 ENCOUNTER — Encounter (INDEPENDENT_AMBULATORY_CARE_PROVIDER_SITE_OTHER): Payer: Self-pay

## 2016-03-19 ENCOUNTER — Ambulatory Visit (INDEPENDENT_AMBULATORY_CARE_PROVIDER_SITE_OTHER): Payer: PPO | Admitting: Allergy and Immunology

## 2016-03-19 VITALS — BP 142/84 | HR 72 | Resp 20

## 2016-03-19 DIAGNOSIS — J4540 Moderate persistent asthma, uncomplicated: Secondary | ICD-10-CM

## 2016-03-19 DIAGNOSIS — J3089 Other allergic rhinitis: Secondary | ICD-10-CM | POA: Diagnosis not present

## 2016-03-19 DIAGNOSIS — B999 Unspecified infectious disease: Secondary | ICD-10-CM

## 2016-03-19 NOTE — Patient Instructions (Addendum)
  1. Can try a period off montelukast of 2 weeks to see if this resolves numbness of hands  2. Continue to Treat and prevent inflammation:   A. fluticasone 2 sprays each nostril one time per day  B. montelukast 10 mg one tablet once a day  C. Arnuity 200 one inhalation 1 time per day.    3. If needed:   A. nasal saline  B. OTC antihistamine  - Allegra    C. Proventil HFA 2 puffs every 4-6 hours  4. Return to clinic in 12 weeks or earlier if problem  5. Obtain fall flu vaccine

## 2016-03-19 NOTE — Progress Notes (Signed)
Follow-up Note  Referring Provider: [name], * Primary Provider: [name], MD Date of Office Visit: 03/19/2016  Subjective:   Shane [name] (DOB: 05/03/1946) is a 70 y.o. male who returns to the [facility] [place] on 03/19/2016 in re-evaluation of the following:  HPI: Shane [name] returns to this clinic in reevaluation of his allergic rhinitis and asthma and recurrent infections addressed during his initial evaluation of 02/06/2016.  He believes that his upper airways are doing better. He has less stuffiness and less nasal congestion. He still occasionally has some drainage that is occasionally green.He is just finishing a course of Augmentin. He has not been having any issues with his lower airway. He does not have a need to use a short acting bronchodilator.  Shane [name] has been having problems with numbness of his fingers since his last visit with me and since starting montelukast. However, it should be noted that he started escitalopram at the same time he started his montelukast. He has no other neurological symptoms.    Medication List      albuterol 108 (90 Base) MCG/ACT inhaler Commonly known as:  PROVENTIL HFA Inhale 2 puffs into the lungs every 4 (four) hours as needed for wheezing or shortness of breath.   ALLEGRA ALLERGY 180 MG tablet Generic drug:  fexofenadine Take 180 mg by mouth daily.   amoxicillin-clavulanate 1000-62.5 MG 12 hr tablet Commonly known as:  AUGMENTIN XR Take one tablet by mouth twice daily for 10 days.   escitalopram 10 MG tablet Commonly known as:  LEXAPRO Take 10 mg by mouth daily.   fluticasone 50 MCG/ACT nasal spray Commonly known as:  FLONASE Place 2 sprays into both nostrils daily.   Fluticasone Furoate 200 MCG/ACT Aepb Commonly known as:  ARNUITY ELLIPTA Inhale 1 Dose into the lungs daily.   guaiFENesin 600 MG 12 hr tablet Commonly known as:  MUCINEX Take 1,200 mg by mouth 2 (two) times daily.   hydrocortisone 2.5 % cream Apply topically 2 (two) times daily.   ketoconazole 2 % shampoo Commonly known as:  NIZORAL Apply topically as directed.   montelukast 10 MG tablet Commonly known as:  SINGULAIR Take 1 tablet (10 mg total) by mouth at bedtime.   SYSTANE BALANCE OP Apply to eye. Reported on 12/19/2015       Past Medical History:  Diagnosis Date  . Acute asthmatic bronchitis   . Anemia   . Anxiety disorder   . Borderline diabetes mellitus   . Borderline hypertension   . Chest wall pain   . Degenerative joint disease   . GERD (gastroesophageal reflux disease)   . History of headache   . History of sinusitis   . Posttraumatic stress disorder     Past Surgical History:  Procedure Laterality Date  . FINGER SURGERY Right    index  . NASAL SINUS SURGERY     x 2    No Known Allergies  Review of systems negative except as noted in HPI / PMHx or noted below:  Review of Systems  Constitutional: Negative.   HENT: Negative.   Eyes: Negative.   Respiratory: Negative.   Cardiovascular: Negative.   Gastrointestinal: Negative.   Genitourinary: Negative.   Musculoskeletal: Negative.   Skin: Negative.   Neurological: Negative.   Endo/Heme/Allergies: Negative.   Psychiatric/Behavioral: Negative.      Objective:   Vitals:   03/19/16 1545  BP: (!) 142/84  Pulse: 72  Resp: 20  Physical Exam  Constitutional: He is well-developed, well-nourished, and in no distress.  HENT:  Head: Normocephalic.  Right Ear: Tympanic membrane, external ear and ear canal normal.  Left Ear: Tympanic membrane, external ear and ear canal normal.  Nose: Nose normal. No mucosal edema or rhinorrhea.  Mouth/Throat: Uvula is midline, oropharynx is clear and moist and mucous membranes are normal. No oropharyngeal exudate.  Eyes: Conjunctivae are normal.  Neck: Trachea normal. No tracheal tenderness present. No tracheal deviation present. No thyromegaly present.   Cardiovascular: Normal rate, regular rhythm, S1 normal, S2 normal and normal heart sounds.   No murmur heard. Pulmonary/Chest: Breath sounds normal. No stridor. No respiratory distress. He has no wheezes. He has no rales.  Musculoskeletal: He exhibits no edema.  Lymphadenopathy:       Head (right side): No tonsillar adenopathy present.       Head (left side): No tonsillar adenopathy present.    He has no cervical adenopathy.  Neurological: He is alert. Gait normal.  Skin: No rash noted. He is not diaphoretic. No erythema. Nails show no clubbing.  Psychiatric: Mood and affect normal.    Diagnostics: Results of a sinus CTscan performed 01 Nov 2015 identified:  Moderate mucosal thickening RIGHT maxillary sinus with mild mucosalthickening in LEFT maxillary sinus. No layering fluid.No significant fluid or mucosal thickening in the frontal, sphenoid,or ethmoid sinuses.Nasal septum essentially midline. RIGHT middle turbinate concha bullosa. BILATERAL infundibular narrowing.   Spirometry was performed and demonstrated an FEV1 of 2.82 at 93 % of predicted.  Assessment and Plan:   1. Asthma, moderate persistent, well-controlled   2. Recurrent infections   3. Other allergic rhinitis     1. Can try a period off montelukast of 2 weeks to see if this resolves numbness of hands  2. Continue to Treat and prevent inflammation:   A. fluticasone 2 sprays each nostril one time per day  B. montelukast 10 mg one tablet once a day  C. Arnuity 200 one inhalation 1 time per day.    3. If needed:   A. nasal saline  B. OTC antihistamine  - Allegra    C. Proventil HFA 2 puffs every 4-6 hours  4. Return to clinic in 12 weeks or earlier if problem  5. Obtain fall flu vaccine   Shane [name] is better on his current combination of anti-inflammatory agents as specified above. There is the possibility that montelukast is giving rise to a peripheral neuropathy and to address this issue will have him stay off  montelukast for 2 weeks. Certainly if this continues then this may be secondary to his escitalopram use and he will need to address his issue with his family doctor. I would like to continue to have him use this plan for a full 12 weeks. He can return to this clinic sooner should be a significant problem during the interval.  Shane Katz, MD [facility]

## 2016-04-05 ENCOUNTER — Ambulatory Visit (INDEPENDENT_AMBULATORY_CARE_PROVIDER_SITE_OTHER): Payer: PPO | Admitting: Internal Medicine

## 2016-04-05 ENCOUNTER — Encounter: Payer: Self-pay | Admitting: Internal Medicine

## 2016-04-05 ENCOUNTER — Other Ambulatory Visit (INDEPENDENT_AMBULATORY_CARE_PROVIDER_SITE_OTHER): Payer: PPO

## 2016-04-05 VITALS — BP 132/70 | HR 53 | Temp 98.5°F | Resp 16 | Ht 74.0 in | Wt 250.0 lb

## 2016-04-05 DIAGNOSIS — Z23 Encounter for immunization: Secondary | ICD-10-CM

## 2016-04-05 DIAGNOSIS — D649 Anemia, unspecified: Secondary | ICD-10-CM

## 2016-04-05 DIAGNOSIS — R002 Palpitations: Secondary | ICD-10-CM

## 2016-04-05 LAB — CBC
HCT: 39.1 % (ref 39.0–52.0)
HEMOGLOBIN: 12.3 g/dL — AB (ref 13.0–17.0)
MCHC: 31.5 g/dL (ref 30.0–36.0)
PLATELETS: 159 10*3/uL (ref 150.0–400.0)
RBC: 6.11 Mil/uL — ABNORMAL HIGH (ref 4.22–5.81)
RDW: 16.5 % — ABNORMAL HIGH (ref 11.5–15.5)
WBC: 5.1 10*3/uL (ref 4.0–10.5)

## 2016-04-05 LAB — VITAMIN B12: VITAMIN B 12: 207 pg/mL — AB (ref 211–911)

## 2016-04-05 LAB — BRAIN NATRIURETIC PEPTIDE: Pro B Natriuretic peptide (BNP): 74 pg/mL (ref 0.0–100.0)

## 2016-04-05 LAB — FERRITIN: Ferritin: 70.6 ng/mL (ref 22.0–322.0)

## 2016-04-05 MED ORDER — OMEPRAZOLE 40 MG PO CPDR: 40.0000 mg | DELAYED_RELEASE_CAPSULE | Freq: Every day | ORAL | 3 refills | Status: DC

## 2016-04-05 NOTE — Progress Notes (Signed)
Pre visit review using our clinic review tool, if applicable. No additional management support is needed unless otherwise documented below in the visit note. 

## 2016-04-05 NOTE — Patient Instructions (Signed)
Your EKG is regular today with several extra beats. This is more likely to happen with low blood levels.   I would like you to start taking a medicine for the stomach in case an ulcer is causing this problem.   We are checking the blood work today.   We will have you see a cardiologist for the extra heart beats for the monitor we talked about.

## 2016-04-05 NOTE — Progress Notes (Signed)
   Subjective:    Patient ID: Shane [name], male    DOB: 07/15/1945, 70 y.o.   MRN: [id number]  HPI The patient is a 70 YO man coming in for anemia. This was found by his allergy doctor and he was advised to see [place]. He denies any dark stools or nose bleeds or blood loss he knows of. He does not take any blood thinners. He denies using OTC pain meds or taking aspirin daily. He denies GERD symptoms. Hx ulcer long ago. Polyp removed in 2014 and due for follow up 2019. Some mild hemorrhoids but not bad lately. He also states that he was told he was in irregular beat during allergy visit and them mentioned the term A fib. They did not take an EKG at that time. He has notice some rare extra beats especially in the evening in the last couple months. No medication changes recently.   Review of Systems  Constitutional: Positive for fatigue. Negative for activity change, appetite change, fever and unexpected weight change.  Respiratory: Negative.   Cardiovascular: Positive for palpitations. Negative for chest pain and leg swelling.  Gastrointestinal: Negative for abdominal distention, abdominal pain, blood in stool, constipation and diarrhea.  Musculoskeletal: Negative.   Skin: Negative.   Neurological: Negative.   Hematological: Negative.   Psychiatric/Behavioral: Negative.       Objective:   Physical Exam  Constitutional: He is oriented to person, place, and time. He appears well-developed and well-nourished.  HENT:  Head: Normocephalic and atraumatic.  Eyes: EOM are normal.  Neck: Normal range of motion.  Cardiovascular: Normal rate and regular rhythm.   Pulmonary/Chest: Effort normal and breath sounds normal.  Abdominal: Soft. Bowel sounds are normal. He exhibits no distension. There is no tenderness. There is no rebound.  Musculoskeletal: He exhibits no edema.  Neurological: He is alert and oriented to person, place, and time.  Skin: Skin is warm and dry.   Vitals:   04/05/16 1352    BP: 132/70  Pulse: (!) 53  Resp: 16  Temp: 98.5 F (36.9 C)  TempSrc: Oral  SpO2: 93%  Weight: 250 lb (113.4 kg)  Height: 6\' 2"  (1.88 m)   EKG: Rate 67, axis normal, intervals normal, frequent PVCs, no st or t wave changes, compared to prior no atrial premature beats and more pvcs    Assessment & Plan:  Flu shot given at visit.

## 2016-04-06 ENCOUNTER — Other Ambulatory Visit: Payer: Self-pay | Admitting: Internal Medicine

## 2016-04-06 DIAGNOSIS — R002 Palpitations: Secondary | ICD-10-CM | POA: Insufficient documentation

## 2016-04-06 MED ORDER — VITAMIN B-12 1000 MCG PO TABS: 1000.0000 ug | ORAL_TABLET | Freq: Every day | ORAL | 3 refills | Status: DC

## 2016-04-06 NOTE — Assessment & Plan Note (Signed)
From PVCs likely due to the new anemia. Will treat that first and if still having palpitations will refer to cardiology for evaluation or try beta blocker.

## 2016-04-06 NOTE — Assessment & Plan Note (Signed)
Newly discovered Hg 10.8 and has had anemia in the past (with hx ulcer). Checking CBC, ferritin, B12, and start on PPI daily. EKG with changes likely associated with the anemia. Will treat that. Colonoscopy up to date. Based on labs may refer back to GI for possible EGD.

## 2016-04-08 ENCOUNTER — Telehealth: Payer: Self-pay | Admitting: Internal Medicine

## 2016-04-08 NOTE — Telephone Encounter (Signed)
Spoke with patient about taking iron.

## 2016-04-08 NOTE — Telephone Encounter (Signed)
Patient called back in. Gave MD response on labs.  Patient would like to know if he needs to take a liquid or slow release iron or any other information on this to get OTC.  Please follow up in regard.

## 2016-06-18 ENCOUNTER — Ambulatory Visit: Payer: PPO | Admitting: Allergy and Immunology

## 2016-07-23 ENCOUNTER — Encounter: Payer: Self-pay | Admitting: Allergy and Immunology

## 2016-07-23 ENCOUNTER — Ambulatory Visit (INDEPENDENT_AMBULATORY_CARE_PROVIDER_SITE_OTHER): Payer: PPO | Admitting: Allergy and Immunology

## 2016-07-23 ENCOUNTER — Encounter (INDEPENDENT_AMBULATORY_CARE_PROVIDER_SITE_OTHER): Payer: Self-pay

## 2016-07-23 VITALS — BP 142/78 | HR 48 | Resp 18

## 2016-07-23 DIAGNOSIS — J4540 Moderate persistent asthma, uncomplicated: Secondary | ICD-10-CM

## 2016-07-23 DIAGNOSIS — J3089 Other allergic rhinitis: Secondary | ICD-10-CM

## 2016-07-23 DIAGNOSIS — B999 Unspecified infectious disease: Secondary | ICD-10-CM | POA: Diagnosis not present

## 2016-07-23 NOTE — Progress Notes (Signed)
Follow-up Note  Referring Provider: [name], * Primary Provider: [name], MD Date of Office Visit: 07/23/2016  Subjective:   Shane [name] (DOB: 04/13/1946) is a 71 y.o. male who returns to the [facility] on 07/23/2016 in re-evaluation of the following:  HPI: Shane [name] returns to this clinic in reevaluation of his allergic rhinitis and asthma and history of recurrent infections involving his respiratory tract. I've not seen him in his clinic since September 2017.  He was doing very well with both his upper and lower respiratory tract and rarely had any problems requiring him to use a bronchodilator. He has not required any systemic steroids or antibiotics since I've seen him in this clinic.  However, this past Friday he developed nasal congestion and some green postnasal drip and some slight cough. He has not improved nor has he gotten any worse since that point in time over the course of the past 5 days. He does not have any bad headache or fever or other associated systemic or constitutional symptoms.  [place] did obtain a flu vaccine this year.  Allergies as of 07/23/2016   No Known Allergies     Medication List      albuterol 108 (90 Base) MCG/ACT inhaler Commonly known as:  PROVENTIL HFA Inhale 2 puffs into the lungs every 4 (four) hours as needed for wheezing or shortness of breath.   ALLEGRA ALLERGY 180 MG tablet Generic drug:  fexofenadine Take 180 mg by mouth daily.   escitalopram 10 MG tablet Commonly known as:  LEXAPRO Take 10 mg by mouth daily.   fluticasone 50 MCG/ACT nasal spray Commonly known as:  FLONASE Place 2 sprays into both nostrils daily.   Fluticasone Furoate 200 MCG/ACT Aepb Commonly known as:  ARNUITY ELLIPTA Inhale 1 Dose into the lungs daily.   hydrocortisone 2.5 % cream Apply topically 2 (two) times daily.   ketoconazole 2 % shampoo Commonly known as:  NIZORAL Apply topically as  directed.   montelukast 10 MG tablet Commonly known as:  SINGULAIR Take 1 tablet (10 mg total) by mouth at bedtime.   omeprazole 40 MG capsule Commonly known as:  PRILOSEC Take 1 capsule (40 mg total) by mouth daily.   SYSTANE BALANCE OP Apply to eye. Reported on 12/19/2015   vitamin B-12 1000 MCG tablet Commonly known as:  CYANOCOBALAMIN Take 1 tablet (1,000 mcg total) by mouth daily.       Past Medical History:  Diagnosis Date  . Acute asthmatic bronchitis   . Anemia   . Anxiety disorder   . Borderline diabetes mellitus   . Borderline hypertension   . Chest wall pain   . Degenerative joint disease   . GERD (gastroesophageal reflux disease)   . History of headache   . History of sinusitis   . Posttraumatic stress disorder     Past Surgical History:  Procedure Laterality Date  . FINGER SURGERY Right    index  . NASAL SINUS SURGERY     x 2    Review of systems negative except as noted in HPI / PMHx or noted below:  Review of Systems  Constitutional: Negative.   HENT: Negative.   Eyes: Negative.   Respiratory: Negative.   Cardiovascular: Negative.   Gastrointestinal: Negative.   Genitourinary: Negative.   Musculoskeletal: Negative.   Skin: Negative.   Neurological: Negative.   Endo/Heme/Allergies: Negative.   Psychiatric/Behavioral: Negative.      Objective:   Vitals:  07/23/16 1810  BP: (!) 142/78  Pulse: (!) 48  Resp: 18          Physical Exam  Constitutional: He is well-developed, well-nourished, and in no distress.  HENT:  Head: Normocephalic.  Right Ear: Tympanic membrane, external ear and ear canal normal.  Left Ear: Tympanic membrane, external ear and ear canal normal.  Nose: Nose normal. No mucosal edema or rhinorrhea.  Mouth/Throat: Uvula is midline, oropharynx is clear and moist and mucous membranes are normal. No oropharyngeal exudate.  Eyes: Conjunctivae are normal.  Neck: Trachea normal. No tracheal tenderness present. No  tracheal deviation present. No thyromegaly present.  Cardiovascular: Normal rate, regular rhythm, S1 normal, S2 normal and normal heart sounds.   No murmur heard. Pulmonary/Chest: Breath sounds normal. No stridor. No respiratory distress. He has no wheezes. He has no rales.  Musculoskeletal: He exhibits no edema.  Lymphadenopathy:       Head (right side): No tonsillar adenopathy present.       Head (left side): No tonsillar adenopathy present.    He has no cervical adenopathy.  Neurological: He is alert. Gait normal.  Skin: No rash noted. He is not diaphoretic. No erythema. Nails show no clubbing.  Psychiatric: Mood and affect normal.    Diagnostics:    Spirometry was performed and demonstrated an FEV1 of 2.78 at 89 % of predicted.  Assessment and Plan:   1. Asthma, moderate persistent, well-controlled   2. Other allergic rhinitis   3. Recurrent infections     1. Prednisone 10 mg - 2 tablets once a day for 4 days only  2. Continue to Treat and prevent inflammation:   A. fluticasone 2 sprays each nostril one time per day  B. montelukast 10 mg one tablet once a day  C. Arnuity 200 one inhalation 1 time per day.    3. If needed:   A. nasal saline  B. OTC antihistamine  - Allegra    C. Proventil HFA 2 puffs every 4-6 hours  D. OTC Mucinex DM 2 tablets twice a day  4. Return to clinic in 12 weeks or earlier if problem  5. Antibiotics?  Vincil appears to developed a viral respiratory tract infection and I'll treat him with anti-inflammatory agents as noted above and he'll continue to use treatment directed against his atopic respiratory disease at this point in time. I will withhold any antibiotics unless of course he does not respond to this treatment over the course of the next week or so. If he continues to do well I'll keep him on this plan for full 12 weeks and then we'll see if there is no opportunity to consolidate some of this treatment at that point in time if he does  well during the interval.  Allena Katz, MD [place]

## 2016-07-23 NOTE — Patient Instructions (Signed)
  1. Prednisone 10 mg - 2 tablets once a day for 4 days only  2. Continue to Treat and prevent inflammation:   A. fluticasone 2 sprays each nostril one time per day  B. montelukast 10 mg one tablet once a day  C. Arnuity 200 one inhalation 1 time per day.    3. If needed:   A. nasal saline  B. OTC antihistamine  - Allegra    C. Proventil HFA 2 puffs every 4-6 hours  D. OTC Mucinex DM 2 tablets twice a day  4. Return to clinic in 12 weeks or earlier if problem  5. Antibiotics?

## 2016-08-18 ENCOUNTER — Other Ambulatory Visit: Payer: Self-pay | Admitting: Allergy and Immunology

## 2016-10-15 ENCOUNTER — Ambulatory Visit (INDEPENDENT_AMBULATORY_CARE_PROVIDER_SITE_OTHER): Payer: PPO | Admitting: Allergy and Immunology

## 2016-10-15 ENCOUNTER — Encounter: Payer: Self-pay | Admitting: Allergy and Immunology

## 2016-10-15 VITALS — BP 150/90 | HR 56 | Resp 20

## 2016-10-15 DIAGNOSIS — K219 Gastro-esophageal reflux disease without esophagitis: Secondary | ICD-10-CM | POA: Diagnosis not present

## 2016-10-15 DIAGNOSIS — J3089 Other allergic rhinitis: Secondary | ICD-10-CM | POA: Diagnosis not present

## 2016-10-15 DIAGNOSIS — J4530 Mild persistent asthma, uncomplicated: Secondary | ICD-10-CM | POA: Diagnosis not present

## 2016-10-15 MED ORDER — RANITIDINE HCL 300 MG PO TABS: 300.0000 mg | ORAL_TABLET | Freq: Every day | ORAL | 5 refills | Status: DC

## 2016-10-15 NOTE — Patient Instructions (Addendum)
  1. Continue to Treat and prevent inflammation:   A. fluticasone 2 sprays each nostril one time per day  B. montelukast 10 mg one tablet once a day  C. Arnuity 200 one inhalation 1 time per day.    2. Treat and prevent reflux:   A. Consolidate all forms of caffeine  B. Nexium 40mg  one tablet in AM  C. Ranitidine 300mg  one tablet in PM  3. If needed:   A. nasal saline  B. OTC antihistamine  - Allegra    C. Proventil HFA 2 puffs every 4-6 hours  D. OTC Mucinex DM 2 tablets twice a day  4. Return to clinic in 8 weeks or earlier if problem

## 2016-10-15 NOTE — Progress Notes (Signed)
Follow-up Note  Referring Provider: [name], * Primary Provider: [name], MD Date of Office Visit: 10/15/2016  Subjective:   Shane [name] (DOB: 04/22/1946) is a 71 y.o. male who returns to the [facility] [place] on 10/15/2016 in re-evaluation of the following:  HPI: Shane [name] returns to this clinic in reevaluation of his allergic rhinitis and asthma. I have not seen him in this clinic since January 2018.  During the interval he has had excellent control of his asthma and has not required a systemic steroid and can exercise without any difficulty and rarely uses a short acting bronchodilator.  He has not been having any problems with his nose. He has not required an antibiotic to treat an episode of sinusitis.  He does have postnasal drip and throat clearing. He has a history of reflux that he does not treat consistently. He does have tea about 3 times per week.  Allergies as of 10/15/2016   No Known Allergies     Medication List      albuterol 108 (90 Base) MCG/ACT inhaler Commonly known as:  PROVENTIL HFA Inhale 2 puffs into the lungs every 4 (four) hours as needed for wheezing or shortness of breath.   escitalopram 10 MG tablet Commonly known as:  LEXAPRO Take 10 mg by mouth daily.   fluticasone 50 MCG/ACT nasal spray Commonly known as:  FLONASE Place 2 sprays into both nostrils daily.   Fluticasone Furoate 200 MCG/ACT Aepb Commonly known as:  ARNUITY ELLIPTA Inhale 1 Dose into the lungs daily.   hydrocortisone 2.5 % cream Apply topically 2 (two) times daily.   ketoconazole 2 % shampoo Commonly known as:  NIZORAL Apply topically as directed.   montelukast 10 MG tablet Commonly known as:  SINGULAIR TAKE 1 TABLET BY MOUTH AT BEDTIME   omeprazole 40 MG capsule Commonly known as:  PRILOSEC Take 1 capsule (40 mg total) by mouth daily.   SYSTANE BALANCE OP Apply to eye. Reported on 12/19/2015   vitamin B-12 1000 MCG  tablet Commonly known as:  CYANOCOBALAMIN Take 1 tablet (1,000 mcg total) by mouth daily.       Past Medical History:  Diagnosis Date  . Acute asthmatic bronchitis   . Anemia   . Anxiety disorder   . Borderline diabetes mellitus   . Borderline hypertension   . Chest wall pain   . Degenerative joint disease   . GERD (gastroesophageal reflux disease)   . History of headache   . History of sinusitis   . Posttraumatic stress disorder     Past Surgical History:  Procedure Laterality Date  . FINGER SURGERY Right    index  . NASAL SINUS SURGERY     x 2    Review of systems negative except as noted in HPI / PMHx or noted below:  Review of Systems  Constitutional: Negative.   HENT: Negative.   Eyes: Negative.   Respiratory: Negative.   Cardiovascular: Negative.   Gastrointestinal: Negative.   Genitourinary: Negative.   Musculoskeletal: Negative.   Skin: Negative.   Neurological: Negative.   Endo/Heme/Allergies: Negative.   Psychiatric/Behavioral: Negative.      Objective:   Vitals:   10/15/16 1638  BP: (!) 150/90  Pulse: (!) 56  Resp: 20          Physical Exam  Constitutional: He is well-developed, well-nourished, and in no distress.  Throat clearing  HENT:  Head: Normocephalic.  Right Ear: Tympanic membrane, external  ear and ear canal normal.  Left Ear: Tympanic membrane, external ear and ear canal normal.  Nose: Nose normal. No mucosal edema or rhinorrhea.  Mouth/Throat: Uvula is midline, oropharynx is clear and moist and mucous membranes are normal. No oropharyngeal exudate.  Eyes: Conjunctivae are normal.  Neck: Trachea normal. No tracheal tenderness present. No tracheal deviation present. No thyromegaly present.  Cardiovascular: Normal rate, regular rhythm, S1 normal, S2 normal and normal heart sounds.   No murmur heard. Pulmonary/Chest: Breath sounds normal. No stridor. No respiratory distress. He has no wheezes. He has no rales.  Musculoskeletal:  He exhibits no edema.  Lymphadenopathy:       Head (right side): No tonsillar adenopathy present.       Head (left side): No tonsillar adenopathy present.    He has no cervical adenopathy.  Neurological: He is alert. Gait normal.  Skin: No rash noted. He is not diaphoretic. No erythema. Nails show no clubbing.  Psychiatric: Mood and affect normal.    Diagnostics:    Spirometry was performed and demonstrated an FEV1 of 2.61 at 86 % of predicted.  Assessment and Plan:   1. Asthma, well controlled, mild persistent   2. Other allergic rhinitis   3. LPRD (laryngopharyngeal reflux disease)     1. Continue to Treat and prevent inflammation:   A. fluticasone 2 sprays each nostril one time per day  B. montelukast 10 mg one tablet once a day  C. Arnuity 200 one inhalation 1 time per day.    2. Treat and prevent reflux:   A. Consolidate all forms of caffeine  B. Nexium 40mg  one tablet in AM  C. Ranitidine 300mg  one tablet in PM  3. If needed:   A. nasal saline  B. OTC antihistamine  - Allegra    C. Proventil HFA 2 puffs every 4-6 hours  D. OTC Mucinex DM 2 tablets twice a day  4. Return to clinic in 8 weeks or earlier if problem    I'm going to have [name] treat reflux as his history is very consistent with LPR and we will see what type of response we get with this plan over the course the next 8 weeks. He will continue to use anti-inflammatory agents for both his upper and lower airways as noted above. He'll contact me during the interval should there be any problem with this plan as he moves forward.  Allena Katz, MD [facility] [place]

## 2016-11-21 ENCOUNTER — Other Ambulatory Visit (INDEPENDENT_AMBULATORY_CARE_PROVIDER_SITE_OTHER): Payer: PPO

## 2016-11-21 ENCOUNTER — Ambulatory Visit (INDEPENDENT_AMBULATORY_CARE_PROVIDER_SITE_OTHER): Payer: PPO | Admitting: Internal Medicine

## 2016-11-21 ENCOUNTER — Encounter: Payer: Self-pay | Admitting: Internal Medicine

## 2016-11-21 VITALS — BP 138/74 | HR 64 | Temp 98.8°F | Resp 12 | Ht 74.0 in | Wt 278.0 lb

## 2016-11-21 DIAGNOSIS — R7301 Impaired fasting glucose: Secondary | ICD-10-CM

## 2016-11-21 DIAGNOSIS — Z0000 Encounter for general adult medical examination without abnormal findings: Secondary | ICD-10-CM

## 2016-11-21 DIAGNOSIS — R030 Elevated blood-pressure reading, without diagnosis of hypertension: Secondary | ICD-10-CM

## 2016-11-21 DIAGNOSIS — D509 Iron deficiency anemia, unspecified: Secondary | ICD-10-CM | POA: Diagnosis not present

## 2016-11-21 LAB — LIPID PANEL
CHOLESTEROL: 116 mg/dL (ref 0–200)
HDL: 36 mg/dL — ABNORMAL LOW (ref >39.00)
LDL Cholesterol: 62 mg/dL (ref 0–99)
NonHDL: 79.96
TRIGLYCERIDES: 88 mg/dL (ref 0.0–149.0)
Total CHOL/HDL Ratio: 3
VLDL: 17.6 mg/dL (ref 0.0–40.0)

## 2016-11-21 LAB — CBC
HEMATOCRIT: 37.7 % — AB (ref 39.0–52.0)
Hemoglobin: 11.9 g/dL — ABNORMAL LOW (ref 13.0–17.0)
MCHC: 31.5 g/dL (ref 30.0–36.0)
MCV: 62.6 fl — ABNORMAL LOW (ref 78.0–100.0)
Platelets: 150 10*3/uL (ref 150.0–400.0)
RBC: 6.02 Mil/uL — ABNORMAL HIGH (ref 4.22–5.81)
RDW: 16.1 % — AB (ref 11.5–15.5)
WBC: 5.4 10*3/uL (ref 4.0–10.5)

## 2016-11-21 LAB — COMPREHENSIVE METABOLIC PANEL
ALBUMIN: 4.2 g/dL (ref 3.5–5.2)
ALT: 8 U/L (ref 0–53)
AST: 18 U/L (ref 0–37)
Alkaline Phosphatase: 64 U/L (ref 39–117)
BILIRUBIN TOTAL: 0.6 mg/dL (ref 0.2–1.2)
BUN: 15 mg/dL (ref 6–23)
CO2: 29 mEq/L (ref 19–32)
CREATININE: 1.21 mg/dL (ref 0.40–1.50)
Calcium: 9 mg/dL (ref 8.4–10.5)
Chloride: 105 mEq/L (ref 96–112)
GFR: 75.75 mL/min (ref >60.00)
Glucose, Bld: 98 mg/dL (ref 70–99)
Potassium: 3.9 mEq/L (ref 3.5–5.1)
Sodium: 138 mEq/L (ref 135–145)
Total Protein: 6.8 g/dL (ref 6.0–8.3)

## 2016-11-21 LAB — HEMOGLOBIN A1C: Hgb A1c MFr Bld: 6.4 % (ref 4.6–6.5)

## 2016-11-21 NOTE — Patient Instructions (Signed)
We will do your blood work today and call you back with the results.

## 2016-11-22 NOTE — Progress Notes (Signed)
   Subjective:    Patient ID: Shane [name], male    DOB: 1946/05/12, 71 y.o.   MRN: [id number]  HPI The patient is a 71 YO man coming in for follow up of his anemia (from prior GI bleeding, none since last visit, still eating more protein and iron rich foods, denies blood in stool or dark stool, no nosebleeds, denies SOB or fatigue) and his impaired fasting glucose (has been trying to exercise some, would like to avoid diabetes and medications, working on less sugary beverages and now drinking more water, denies checking sugars), and his blood pressure (had been elevated when he was anemic and has been monitoring at home, running 120/70s at home, not taking medication, more exercise lately). No new concerns.   Review of Systems  Constitutional: Positive for activity change. Negative for appetite change, chills, fever and unexpected weight change.  HENT: Negative.   Eyes: Negative.   Respiratory: Negative.   Cardiovascular: Negative.   Gastrointestinal: Negative.   Musculoskeletal: Negative.   Skin: Negative.   Neurological: Negative.   Hematological: Negative.   Psychiatric/Behavioral: Negative.       Objective:   Physical Exam  Constitutional: He is oriented to person, place, and time. He appears well-developed and well-nourished.  HENT:  Head: Normocephalic and atraumatic.  Eyes: EOM are normal.  Neck: Normal range of motion.  Cardiovascular: Normal rate and regular rhythm.   Pulmonary/Chest: Effort normal and breath sounds normal. No respiratory distress. He has no wheezes. He has no rales.  Abdominal: Soft. Bowel sounds are normal. He exhibits no distension. There is no tenderness. There is no rebound and no guarding.  Musculoskeletal: He exhibits no edema.  Neurological: He is alert and oriented to person, place, and time.  Skin: Skin is warm and dry.  Psychiatric: He has a normal mood and affect.   Vitals:   11/21/16 1525  BP: 138/74  Pulse: 64  Resp: 12  Temp:  98.8 F (37.1 C)  TempSrc: Oral  SpO2: 98%  Weight: 278 lb (126.1 kg)  Height: 6\' 2"  (1.88 m)      Assessment & Plan:

## 2016-11-22 NOTE — Assessment & Plan Note (Signed)
Bp at goal on home readings and in the office today. Encouraged him to continue with regular exercise and work on [employer].

## 2016-11-22 NOTE — Assessment & Plan Note (Addendum)
Checking CBC today, still eating more iron rich foods to help boost levels. No clinical signs of recurrence of bleeding.

## 2016-11-22 NOTE — Assessment & Plan Note (Signed)
Checking HgA1c to ensure no progression toward diabetes. We talked again about diet and exercise as an important way to prevent progression.

## 2016-11-28 ENCOUNTER — Telehealth: Payer: Self-pay | Admitting: Internal Medicine

## 2016-11-28 NOTE — Telephone Encounter (Signed)
Pt called in and has some questions about his labs.  He wants to know more about his iron levels

## 2016-11-29 NOTE — Telephone Encounter (Signed)
LVm with results again and to call back if he has questions

## 2016-12-10 ENCOUNTER — Encounter: Payer: Self-pay | Admitting: Allergy and Immunology

## 2016-12-10 ENCOUNTER — Ambulatory Visit (INDEPENDENT_AMBULATORY_CARE_PROVIDER_SITE_OTHER): Payer: PPO | Admitting: Allergy and Immunology

## 2016-12-10 VITALS — BP 130/80 | HR 64 | Resp 16

## 2016-12-10 DIAGNOSIS — K219 Gastro-esophageal reflux disease without esophagitis: Secondary | ICD-10-CM | POA: Diagnosis not present

## 2016-12-10 DIAGNOSIS — J4530 Mild persistent asthma, uncomplicated: Secondary | ICD-10-CM | POA: Diagnosis not present

## 2016-12-10 DIAGNOSIS — J3089 Other allergic rhinitis: Secondary | ICD-10-CM

## 2016-12-10 MED ORDER — ESOMEPRAZOLE MAGNESIUM 40 MG PO CPDR: 40.0000 mg | DELAYED_RELEASE_CAPSULE | Freq: Every day | ORAL | 5 refills | Status: DC

## 2016-12-10 NOTE — Progress Notes (Signed)
Follow-up Note  Referring Provider: [name], * Primary Provider: [name], MD Date of Office Visit: 12/10/2016  Subjective:   Shane [name] (DOB: 07-Sep-1945) is a 71 y.o. male who returns to the [facility] on 12/10/2016 in re-evaluation of the following:  HPI: Shane [name] returns to this clinic in reevaluation of his asthma and allergic rhinitis and suspected LPR. I last saw him in this clinic 10/15/2016 at which point in time we assigned him a plan to address these issues.  Unfortunately, for some unexplained reason, he did not use the therapy for reflux as written and he discontinued his nasal steroid because it gave rise to irritation of his nose and some epistaxis.  He still continues to have issues with throat clearing and drainage. He has eliminated all his tea consumption.  Overall his nose is doing relatively well at this point in time.  He has not had any issues with asthma and has not had to use a short acting bronchodilator and he can exert himself with no problem.  Allergies as of 12/10/2016   No Known Allergies     Medication List      albuterol 108 (90 Base) MCG/ACT inhaler Commonly known as:  PROVENTIL HFA Inhale 2 puffs into the lungs every 4 (four) hours as needed for wheezing or shortness of breath.   escitalopram 10 MG tablet Commonly known as:  LEXAPRO Take 10 mg by mouth daily.   fluticasone 50 MCG/ACT nasal spray Commonly known as:  FLONASE Place 2 sprays into both nostrils daily.   Fluticasone Furoate 200 MCG/ACT Aepb Commonly known as:  ARNUITY ELLIPTA Inhale 1 Dose into the lungs daily.   hydrocortisone 2.5 % cream Apply topically 2 (two) times daily.   ketoconazole 2 % shampoo Commonly known as:  NIZORAL Apply topically as directed.   montelukast 10 MG tablet Commonly known as:  SINGULAIR TAKE 1 TABLET BY MOUTH AT BEDTIME   omeprazole 40 MG capsule Commonly known as:  PRILOSEC Take 1  capsule (40 mg total) by mouth daily.   ranitidine 300 MG tablet Commonly known as:  ZANTAC Take 1 tablet (300 mg total) by mouth at bedtime.   SYSTANE BALANCE OP Apply to eye. Reported on 12/19/2015   vitamin B-12 1000 MCG tablet Commonly known as:  CYANOCOBALAMIN Take 1 tablet (1,000 mcg total) by mouth daily.       Past Medical History:  Diagnosis Date  . Acute asthmatic bronchitis   . Anemia   . Anxiety disorder   . Borderline diabetes mellitus   . Borderline hypertension   . Chest wall pain   . Degenerative joint disease   . GERD (gastroesophageal reflux disease)   . History of headache   . History of sinusitis   . Posttraumatic stress disorder     Past Surgical History:  Procedure Laterality Date  . FINGER SURGERY Right    index  . NASAL SINUS SURGERY     x 2    Review of systems negative except as noted in HPI / PMHx or noted below:  Review of Systems  Constitutional: Negative.   HENT: Negative.   Eyes: Negative.   Respiratory: Negative.   Cardiovascular: Negative.   Gastrointestinal: Negative.   Genitourinary: Negative.   Musculoskeletal: Negative.   Skin: Negative.   Neurological: Negative.   Endo/Heme/Allergies: Negative.   Psychiatric/Behavioral: Negative.      Objective:   Vitals:   12/10/16 1805  BP: 130/80  Pulse: 64  Resp: 16          Physical Exam  Constitutional: He is well-developed, well-nourished, and in no distress.  HENT:  Head: Normocephalic.  Right Ear: Tympanic membrane, external ear and ear canal normal.  Left Ear: Tympanic membrane, external ear and ear canal normal.  Nose: Nose normal. No mucosal edema or rhinorrhea.  Mouth/Throat: Uvula is midline, oropharynx is clear and moist and mucous membranes are normal. No oropharyngeal exudate.  Eyes: Conjunctivae are normal.  Neck: Trachea normal. No tracheal tenderness present. No tracheal deviation present. No thyromegaly present.  Cardiovascular: Normal rate, regular  rhythm, S1 normal, S2 normal and normal heart sounds.   No murmur heard. Pulmonary/Chest: Breath sounds normal. No stridor. No respiratory distress. He has no wheezes. He has no rales.  Musculoskeletal: He exhibits no edema.  Lymphadenopathy:       Head (right side): No tonsillar adenopathy present.       Head (left side): No tonsillar adenopathy present.    He has no cervical adenopathy.  Neurological: He is alert. Gait normal.  Skin: No rash noted. He is not diaphoretic. No erythema. Nails show no clubbing.  Psychiatric: Mood and affect normal.    Diagnostics:    Spirometry was performed and demonstrated an FEV1 of 2.41 at 73 % of predicted.  The patient had an Asthma Control Test with the following results: ACT Total Score: 14.    Assessment and Plan:   1. Asthma, well controlled, mild persistent   2. Other allergic rhinitis   3. LPRD (laryngopharyngeal reflux disease)     1. Continue to Treat and prevent inflammation:   A. OTC NASACORT sprays each nostril one time per day  B. montelukast 10 mg one tablet once a day  C. Arnuity 200 one inhalation 1 time per day.    2. Treat and prevent reflux:   A. Consolidate all forms of caffeine  B. NEXIUM 40mg  one tablet in AM  C. Ranitidine 300mg  one tablet in PM  3. If needed:   A. nasal saline  B. OTC antihistamine  - Allegra    C. Proventil HFA 2 puffs every 4-6 hours  D. OTC Mucinex DM 2 tablets twice a day  4. Return to clinic in 8 weeks or earlier if problem    I have once again given Shane [name] a plan of therapy that includes aggressive treatment directed against reflux for at least the next 8 weeks. We need to make sure that his remaining throat clearing and drainage is a result of LPR and the only way to really do that is to treat him aggressively for an 8 week period in time and then make a decision about whether or not he requires further evaluation and treatment. Hopefully with this approach he will heal up and then he  will be able to use much less medication to prevent him from developing significant irritation and inflammation of his airway. He will contact me should there be any significant problems during the interval.  Allena Katz, MD Allergy / [place]

## 2016-12-10 NOTE — Patient Instructions (Addendum)
  1. Continue to Treat and prevent inflammation:   A. OTC NASACORT sprays each nostril one time per day  B. montelukast 10 mg one tablet once a day  C. Arnuity 200 one inhalation 1 time per day.    2. Treat and prevent reflux:   A. Consolidate all forms of caffeine  B. NEXIUM 40mg  one tablet in AM  C. Ranitidine 300mg  one tablet in PM  3. If needed:   A. nasal saline  B. OTC antihistamine  - Allegra    C. Proventil HFA 2 puffs every 4-6 hours  D. OTC Mucinex DM 2 tablets twice a day  4. Return to clinic in 8 weeks or earlier if problem

## 2017-01-07 ENCOUNTER — Ambulatory Visit (INDEPENDENT_AMBULATORY_CARE_PROVIDER_SITE_OTHER): Payer: PPO | Admitting: Nurse Practitioner

## 2017-01-07 ENCOUNTER — Encounter: Payer: Self-pay | Admitting: Nurse Practitioner

## 2017-01-07 VITALS — BP 130/80 | HR 61 | Temp 98.3°F | Ht 74.0 in | Wt 273.0 lb

## 2017-01-07 DIAGNOSIS — M25531 Pain in right wrist: Secondary | ICD-10-CM | POA: Diagnosis not present

## 2017-01-07 DIAGNOSIS — M25532 Pain in left wrist: Secondary | ICD-10-CM | POA: Diagnosis not present

## 2017-01-07 MED ORDER — MELOXICAM 7.5 MG PO TABS: 7.5000 mg | ORAL_TABLET | Freq: Every day | ORAL | 0 refills | Status: DC | PRN

## 2017-01-07 NOTE — Patient Instructions (Signed)
Wrist Pain, Adult There are many things that can cause wrist pain. Some common causes include:  An injury to the wrist area, such as a sprain, strain, or fracture.  Overuse of the joint.  A condition that causes increased pressure on a nerve in the wrist (carpal tunnel syndrome).  Wear and tear of the joints that occurs with aging (osteoarthritis).  A variety of other types of arthritis.  Sometimes, the cause of wrist pain is not known. Often, the pain goes away when you follow instructions from your health care provider for relieving pain at home, such as resting or icing the wrist. If your wrist pain continues, it is important to tell your health care provider. Follow these instructions at home:  Rest the wrist area for at least 48 hours or as long as told by your health care provider.  If a splint or elastic bandage has been applied, use it as told by your health care provider. ? Remove the splint or bandage only as told by your health care provider. ? Loosen the splint or bandage if your fingers tingle, become numb, or turn cold or blue.  If directed, apply ice to the injured area. ? If you have a removable splint or elastic bandage, remove it as told by your health care provider. ? Put ice in a plastic bag. ? Place a towel between your skin and the bag or between your splint or bandage and the bag. ? Leave the ice on for 20 minutes, 2-3 times a day.  Keep your arm raised (elevated) above the level of your heart while you are sitting or lying down.  Take over-the-counter and prescription medicines only as told by your health care provider.  Keep all follow-up visits as told by your health care provider. This is important. Contact a health care provider if:  You have a sudden sharp pain in the wrist, hand, or arm that is different or new.  The swelling or bruising on your wrist or hand gets worse.  Your skin becomes red, gets a rash, or has open sores.  Your pain does not  get better or it gets worse. Get help right away if:  You lose feeling in your fingers or hand.  Your fingers turn white, very red, or cold and blue.  You cannot move your fingers.  You have a fever or chills. This information is not intended to replace advice given to you by your health care provider. Make sure you discuss any questions you have with your health care provider. Document Released: 03/27/2005 Document Revised: 01/11/2016 Document Reviewed: 01/04/2016 Elsevier Interactive Patient Education  2017 [employer]  

## 2017-01-07 NOTE — Progress Notes (Signed)
Subjective:  Patient ID: Shane [name], male    DOB: 05-18-1946  Age: 71 y.o. MRN: [id number]  CC: Wrist Pain (pain hands,wrist,arms going on for a mo/)   Wrist Pain   The pain is present in the left wrist and right wrist. This is a new problem. The current episode started 1 to 4 weeks ago. There has been no history of extremity trauma. The problem occurs intermittently. The problem has been waxing and waning. The quality of the pain is described as aching and dull. Pertinent negatives include no fever, inability to bear weight, itching, joint locking, joint swelling, limited range of motion, numbness, stiffness or tingling. The symptoms are aggravated by activity. He has tried nothing for the symptoms. Family history does not include gout or rheumatoid arthritis. His past medical history is significant for osteoarthritis. There is no history of diabetes, gout or rheumatoid arthritis.    Outpatient Medications Prior to Visit  Medication Sig Dispense Refill  . albuterol (PROVENTIL HFA) 108 (90 Base) MCG/ACT inhaler Inhale 2 puffs into the lungs every 4 (four) hours as needed for wheezing or shortness of breath. 1 Inhaler 5  . escitalopram (LEXAPRO) 10 MG tablet Take 10 mg by mouth daily.    Marland Kitchen esomeprazole (NEXIUM) 40 MG capsule Take 1 capsule (40 mg total) by mouth daily at 12 noon. 30 capsule 5  . Fluticasone Furoate (ARNUITY ELLIPTA) 200 MCG/ACT AEPB Inhale 1 Dose into the lungs daily. 30 each 5  . hydrocortisone 2.5 % cream Apply topically 2 (two) times daily. 30 g 2  . ketoconazole (NIZORAL) 2 % shampoo Apply topically as directed.    . montelukast (SINGULAIR) 10 MG tablet TAKE 1 TABLET BY MOUTH AT BEDTIME 30 tablet 5  . omeprazole (PRILOSEC) 40 MG capsule Take 1 capsule (40 mg total) by mouth daily. 30 capsule 3  . Propylene Glycol (SYSTANE BALANCE OP) Apply to eye. Reported on 12/19/2015    . ranitidine (ZANTAC) 300 MG tablet Take 1 tablet (300 mg total) by mouth at bedtime. 30 tablet  5  . vitamin B-12 (CYANOCOBALAMIN) 1000 MCG tablet Take 1 tablet (1,000 mcg total) by mouth daily. 90 tablet 3  . fluticasone (FLONASE) 50 MCG/ACT nasal spray Place 2 sprays into both nostrils daily.     No facility-administered medications prior to visit.     ROS See HPI  Objective:  BP 130/80   Pulse 61   Temp 98.3 F (36.8 C)   Ht 6\' 2"  (1.88 m)   Wt 273 lb (123.8 kg)   SpO2 98%   BMI 35.05 kg/m   BP Readings from Last 3 Encounters:  01/07/17 130/80  12/10/16 130/80  11/21/16 138/74    Wt Readings from Last 3 Encounters:  01/07/17 273 lb (123.8 kg)  11/21/16 278 lb (126.1 kg)  04/05/16 250 lb (113.4 kg)    Physical Exam  Constitutional: He is oriented to person, place, and time.  Musculoskeletal: Normal range of motion. He exhibits tenderness. He exhibits no edema.       Right elbow: Normal.      Left elbow: Normal.       Right wrist: He exhibits tenderness. He exhibits normal range of motion, no swelling, no effusion and no crepitus.       Left wrist: He exhibits tenderness. He exhibits no swelling, no effusion, no crepitus and no deformity.       Right forearm: Normal.       Left forearm: Normal.  Right hand: Normal.       Left hand: Normal.  Tenderness with extension and flexion of wrists. No paresthesia.  Neurological: He is alert and oriented to person, place, and time.  Skin: Skin is warm and dry. No rash noted. No erythema.    Lab Results  Component Value Date   WBC 5.4 11/21/2016   HGB 11.9 (L) 11/21/2016   HCT 37.7 (L) 11/21/2016   PLT 150.0 11/21/2016   GLUCOSE 98 11/21/2016   CHOL 116 11/21/2016   TRIG 88.0 11/21/2016   HDL 36.00 (L) 11/21/2016   LDLCALC 62 11/21/2016   ALT 8 11/21/2016   AST 18 11/21/2016   NA 138 11/21/2016   K 3.9 11/21/2016   CL 105 11/21/2016   CREATININE 1.21 11/21/2016   BUN 15 11/21/2016   CO2 29 11/21/2016   TSH 1.34 05/24/2008   PSA 0.68 05/24/2008   HGBA1C 6.4 11/21/2016    Dg Chest 2  View  Result Date: 11/23/2015 CLINICAL DATA:  71 year old male with cough and congestion. EXAM: CHEST  2 VIEW COMPARISON:  02/12/2015 FINDINGS: The cardiomediastinal silhouette is unremarkable. Mild peribronchial thickening is unchanged. There is no evidence of focal airspace disease, pulmonary edema, suspicious pulmonary nodule/mass, pleural effusion, or pneumothorax. No acute bony abnormalities are identified. IMPRESSION: No evidence of acute cardiopulmonary disease. Mild chronic peribronchial thickening. Electronically Signed   By: Margarette Canada M.D.   On: 11/23/2015 15:03    Assessment & Plan:   [place] was seen today for wrist pain.  Diagnoses and all orders for this visit:  Pain in both wrists -     meloxicam (MOBIC) 7.5 MG tablet; Take 1 tablet (7.5 mg total) by mouth daily as needed for pain. With food   I am having Mr. Campione start on meloxicam. I am also having him maintain his ketoconazole, hydrocortisone, Propylene Glycol (SYSTANE BALANCE OP), fluticasone, Fluticasone Furoate, albuterol, escitalopram, omeprazole, vitamin B-12, montelukast, ranitidine, and esomeprazole.  Meds ordered this encounter  Medications  . meloxicam (MOBIC) 7.5 MG tablet    Sig: Take 1 tablet (7.5 mg total) by mouth daily as needed for pain. With food    Dispense:  30 tablet    Refill:  0    Order Specific Question:   Supervising Provider    Answer:   Cassandria Anger [[id number]]    Follow-up: Return if symptoms worsen or fail to improve.  Wilfred Lacy, NP

## 2017-01-28 ENCOUNTER — Ambulatory Visit (INDEPENDENT_AMBULATORY_CARE_PROVIDER_SITE_OTHER): Payer: PPO | Admitting: Allergy and Immunology

## 2017-01-28 ENCOUNTER — Encounter: Payer: Self-pay | Admitting: Allergy and Immunology

## 2017-01-28 VITALS — BP 124/70 | HR 60 | Resp 18

## 2017-01-28 DIAGNOSIS — B999 Unspecified infectious disease: Secondary | ICD-10-CM | POA: Diagnosis not present

## 2017-01-28 DIAGNOSIS — J3089 Other allergic rhinitis: Secondary | ICD-10-CM | POA: Diagnosis not present

## 2017-01-28 DIAGNOSIS — K219 Gastro-esophageal reflux disease without esophagitis: Secondary | ICD-10-CM

## 2017-01-28 DIAGNOSIS — J4530 Mild persistent asthma, uncomplicated: Secondary | ICD-10-CM

## 2017-01-28 MED ORDER — AMOXICILLIN-POT CLAVULANATE 875-125 MG PO TABS: ORAL_TABLET | ORAL | 0 refills | Status: DC

## 2017-01-28 MED ORDER — TRIAMCINOLONE ACETONIDE 55 MCG/ACT NA AERO: INHALATION_SPRAY | NASAL | 5 refills | Status: DC

## 2017-01-28 NOTE — Patient Instructions (Signed)
  1. Continue to Treat and prevent inflammation:   A. OTC NASACORT sprays each nostril one time per day  B. montelukast 10 mg one tablet once a day  C. Arnuity 200 one inhalation 1 time per day.    2. Treat and prevent reflux:   A. Consolidate all forms of caffeine  B. NEXIUM 40mg  one tablet in AM  C. Ranitidine 300mg  one tablet in PM  3. Treat infection:   A. Augmentin 875 one tablet two times a day for 20 days total.  4. If needed:   A. nasal saline  B. OTC antihistamine  - Allegra    C. Proventil HFA 2 puffs every 4-6 hours  D. OTC Mucinex DM 2 tablets twice a day  5. Return to clinic in December 2018 or earlier if problem  6. Obtain fall flu vaccine

## 2017-01-28 NOTE — Progress Notes (Signed)
Follow-up Note  Referring Provider: [name], * Primary Provider: [name], MD Date of Office Visit: 01/28/2017  Subjective:   Shane [name] (DOB: 16-Dec-1945) is a 71 y.o. male who returns to the [facility] on 01/28/2017 in re-evaluation of the following:  HPI: Ajmal returns to this clinic in reevaluation of his asthma and allergic rhinitis and LPR. I last saw him in this clinic on 12/10/2016 at which point in time we signed a plan to address each issue.  He still doesn't think he is much better. Still has a lot of drainage in his throat and he has now developed some upper airway nasal discharge that is very thick and "gray".  He has consistently been using his therapy for reflux but has had some problems with using ranitidine as it does appear to upset his stomach somewhat. All of his reflux has resolved. All of his caffeine use has been eliminated.  He has not had to use any type of short acting bronchodilator at this point.  He is scheduled to have a CPAP titration study performed on August 9 in investigation of his sleep apnea.  Allergies as of 01/28/2017   No Known Allergies     Medication List      albuterol 108 (90 Base) MCG/ACT inhaler Commonly known as:  PROVENTIL HFA Inhale 2 puffs into the lungs every 4 (four) hours as needed for wheezing or shortness of breath.   escitalopram 10 MG tablet Commonly known as:  LEXAPRO Take 10 mg by mouth daily.   esomeprazole 40 MG capsule Commonly known as:  NEXIUM Take 1 capsule (40 mg total) by mouth daily at 12 noon.   fluticasone 50 MCG/ACT nasal spray Commonly known as:  FLONASE Place 2 sprays into both nostrils daily.   Fluticasone Furoate 200 MCG/ACT Aepb Commonly known as:  ARNUITY ELLIPTA Inhale 1 Dose into the lungs daily.   hydrocortisone 2.5 % cream Apply topically 2 (two) times daily.   ketoconazole 2 % shampoo Commonly known as:  NIZORAL Apply topically  as directed.   meloxicam 7.5 MG tablet Commonly known as:  MOBIC Take 1 tablet (7.5 mg total) by mouth daily as needed for pain. With food   montelukast 10 MG tablet Commonly known as:  SINGULAIR TAKE 1 TABLET BY MOUTH AT BEDTIME   omeprazole 40 MG capsule Commonly known as:  PRILOSEC Take 1 capsule (40 mg total) by mouth daily.   ranitidine 300 MG tablet Commonly known as:  ZANTAC Take 1 tablet (300 mg total) by mouth at bedtime.   SYSTANE BALANCE OP Apply to eye. Reported on 12/19/2015   vitamin B-12 1000 MCG tablet Commonly known as:  CYANOCOBALAMIN Take 1 tablet (1,000 mcg total) by mouth daily.       Past Medical History:  Diagnosis Date  . Acute asthmatic bronchitis   . Anemia   . Anxiety disorder   . Borderline diabetes mellitus   . Borderline hypertension   . Chest wall pain   . Degenerative joint disease   . GERD (gastroesophageal reflux disease)   . History of headache   . History of sinusitis   . Posttraumatic stress disorder     Past Surgical History:  Procedure Laterality Date  . FINGER SURGERY Right    index  . NASAL SINUS SURGERY     x 2    Review of systems negative except as noted in HPI / PMHx or noted below:  Review of Systems  Constitutional: Negative.   HENT: Negative.   Eyes: Negative.   Respiratory: Negative.   Cardiovascular: Negative.   Gastrointestinal: Negative.   Genitourinary: Negative.   Musculoskeletal: Negative.   Skin: Negative.   Neurological: Negative.   Endo/Heme/Allergies: Negative.   Psychiatric/Behavioral: Negative.      Objective:   Vitals:   01/28/17 1637  BP: 124/70  Pulse: 60  Resp: 18          Physical Exam  Constitutional: He is well-developed, well-nourished, and in no distress.  HENT:  Head: Normocephalic.  Right Ear: Tympanic membrane, external ear and ear canal normal.  Left Ear: Tympanic membrane, external ear and ear canal normal.  Nose: Nose normal. No mucosal edema or rhinorrhea.    Mouth/Throat: Uvula is midline, oropharynx is clear and moist and mucous membranes are normal. No oropharyngeal exudate.  Eyes: Conjunctivae are normal.  Neck: Trachea normal. No tracheal tenderness present. No tracheal deviation present. No thyromegaly present.  Cardiovascular: Normal rate, regular rhythm, S1 normal, S2 normal and normal heart sounds.   No murmur heard. Pulmonary/Chest: Breath sounds normal. No stridor. No respiratory distress. He has no wheezes. He has no rales.  Musculoskeletal: He exhibits no edema.  Lymphadenopathy:       Head (right side): No tonsillar adenopathy present.       Head (left side): No tonsillar adenopathy present.    He has no cervical adenopathy.  Neurological: He is alert. Gait normal.  Skin: No rash noted. He is not diaphoretic. No erythema. Nails show no clubbing.  Psychiatric: Mood and affect normal.    Diagnostics:    Spirometry was performed and demonstrated an FEV1 of 2.82 at 85 % of predicted.  The patient had an Asthma Control Test with the following results: ACT Total Score: 16.    Assessment and Plan:   1. Asthma, well controlled, mild persistent   2. Other allergic rhinitis   3. LPRD (laryngopharyngeal reflux disease)   4. Recurrent infections     1. Continue to Treat and prevent inflammation:   A. OTC NASACORT sprays each nostril one time per day  B. montelukast 10 mg one tablet once a day  C. Arnuity 200 one inhalation 1 time per day.    2. Treat and prevent reflux:   A. Consolidate all forms of caffeine  B. NEXIUM 40mg  one tablet in AM  C. Ranitidine 300mg  one tablet in PM  3. Treat infection:   A. Augmentin 875 one tablet two times a day for 20 days total.  4. If needed:   A. nasal saline  B. OTC antihistamine  - Allegra    C. Proventil HFA 2 puffs every 4-6 hours  D. OTC Mucinex DM 2 tablets twice a day  5. Return to clinic in December 2018 or earlier if problem  6. Obtain fall flu vaccine    I will  assume that Jamarquis may have a persistent infection of his upper airway and treat him with 20 days of Augmentin at the same time he continues to use therapy directed against respiratory tract inflammation and reflux. He will need to stop his ranitidine as this is causing some degree of GI distress. Hopefully when he gets his sleep apnea under better control his airway will work better and most of all his issues will resolve  Allena Katz, MD [facility]

## 2017-02-04 ENCOUNTER — Other Ambulatory Visit: Payer: Self-pay | Admitting: Allergy and Immunology

## 2017-02-04 DIAGNOSIS — M25532 Pain in left wrist: Principal | ICD-10-CM

## 2017-02-04 DIAGNOSIS — M25531 Pain in right wrist: Secondary | ICD-10-CM

## 2017-02-05 ENCOUNTER — Telehealth: Payer: Self-pay | Admitting: Internal Medicine

## 2017-02-05 DIAGNOSIS — M25531 Pain in right wrist: Secondary | ICD-10-CM

## 2017-02-05 DIAGNOSIS — M25532 Pain in left wrist: Principal | ICD-10-CM

## 2017-02-05 MED ORDER — MELOXICAM 7.5 MG PO TABS: 7.5000 mg | ORAL_TABLET | Freq: Every day | ORAL | 0 refills | Status: DC | PRN

## 2017-02-05 NOTE — Telephone Encounter (Signed)
Pt called for a refill of his meloxicam (MOBIC) 7.5 MG tablet  Was seen for this issue on 01/07/2017 By Baldo Ash  Please advise

## 2017-02-05 NOTE — Telephone Encounter (Signed)
Reviewed chart pt is up-to-date sent refills to [facility]  

## 2017-02-21 ENCOUNTER — Other Ambulatory Visit: Payer: Self-pay | Admitting: Allergy and Immunology

## 2017-02-24 ENCOUNTER — Other Ambulatory Visit: Payer: Self-pay | Admitting: Allergy and Immunology

## 2017-03-06 ENCOUNTER — Other Ambulatory Visit (INDEPENDENT_AMBULATORY_CARE_PROVIDER_SITE_OTHER): Payer: PPO

## 2017-03-06 ENCOUNTER — Encounter: Payer: Self-pay | Admitting: Nurse Practitioner

## 2017-03-06 ENCOUNTER — Ambulatory Visit (INDEPENDENT_AMBULATORY_CARE_PROVIDER_SITE_OTHER): Payer: PPO | Admitting: Nurse Practitioner

## 2017-03-06 VITALS — BP 132/80 | HR 64 | Temp 98.9°F | Ht 74.0 in | Wt 269.0 lb

## 2017-03-06 DIAGNOSIS — R1032 Left lower quadrant pain: Secondary | ICD-10-CM

## 2017-03-06 DIAGNOSIS — K625 Hemorrhage of anus and rectum: Secondary | ICD-10-CM

## 2017-03-06 DIAGNOSIS — K529 Noninfective gastroenteritis and colitis, unspecified: Secondary | ICD-10-CM

## 2017-03-06 DIAGNOSIS — K6289 Other specified diseases of anus and rectum: Secondary | ICD-10-CM | POA: Diagnosis not present

## 2017-03-06 LAB — CBC WITH DIFFERENTIAL/PLATELET
BASOS PCT: 0.3 % (ref 0.0–3.0)
Basophils Absolute: 0 10*3/uL (ref 0.0–0.1)
EOS PCT: 3.4 % (ref 0.0–5.0)
Eosinophils Absolute: 0.2 10*3/uL (ref 0.0–0.7)
HCT: 36.9 % — ABNORMAL LOW (ref 39.0–52.0)
Hemoglobin: 11.5 g/dL — ABNORMAL LOW (ref 13.0–17.0)
LYMPHS ABS: 1.5 10*3/uL (ref 0.7–4.0)
Lymphocytes Relative: 24.4 % (ref 12.0–46.0)
MCHC: 31.2 g/dL (ref 30.0–36.0)
Monocytes Absolute: 0.9 10*3/uL (ref 0.1–1.0)
Monocytes Relative: 14.1 % — ABNORMAL HIGH (ref 3.0–12.0)
NEUTROS ABS: 3.6 10*3/uL (ref 1.4–7.7)
NEUTROS PCT: 57.8 % (ref 43.0–77.0)
PLATELETS: 139 10*3/uL — AB (ref 150.0–400.0)
RBC: 5.8 Mil/uL (ref 4.22–5.81)
RDW: 16.2 % — AB (ref 11.5–15.5)
WBC: 6.2 10*3/uL (ref 4.0–10.5)

## 2017-03-06 LAB — BASIC METABOLIC PANEL
BUN: 16 mg/dL (ref 6–23)
CALCIUM: 9.2 mg/dL (ref 8.4–10.5)
CO2: 29 mEq/L (ref 19–32)
CREATININE: 1.19 mg/dL (ref 0.40–1.50)
Chloride: 107 mEq/L (ref 96–112)
GFR: 77.16 mL/min (ref >60.00)
Glucose, Bld: 107 mg/dL — ABNORMAL HIGH (ref 70–99)
Potassium: 3.8 mEq/L (ref 3.5–5.1)
Sodium: 142 mEq/L (ref 135–145)

## 2017-03-06 MED ORDER — HYDROCORTISONE 2.5 % RE CREA: 1.0000 "application " | TOPICAL_CREAM | Freq: Two times a day (BID) | RECTAL | 0 refills | Status: DC

## 2017-03-06 NOTE — Patient Instructions (Signed)
Go to basement for blood draw.  You will be called with results and to schedule CT ABD/pelvis.  Maintain clear liquid diet till further notice.  Clear Liquid Diet A clear liquid diet means that you only have liquids that you can see through. You do not eat any food on this diet. Most people need to follow this diet for only a short time. What do I need to know about this diet?  A clear liquid is a liquid that you can see through when you hold it up to a light.  This diet does not give you all the nutrients that you need. Choose a variety of the liquids that your doctor says you can drink on this diet. That way, you will get as many nutrients as possible.  If you are not sure whether you can have certain items, ask your doctor. What can I have?  Water and flavored water.  Fruit juices that do not have pulp, such as cranberry juice and apple juice.  Tea and coffee without milk or cream.  Clear bouillon or broth.  Broth-based soups that have been strained.  Flavored gelatins.  Honey.  Sugar water.  Frozen ice or frozen ice pops that do not have any milk, yogurt, fruit pieces, or fruit pulp in them.  Clear sodas.  Clear sports drinks. The items listed above may not be a complete list of recommended liquids. Contact your food and nutrition expert (dietitian) for more options. What can I not have?  Juices that have pulp.  Milk.  Cream or cream-based soups.  Yogurt. The items listed above may not be a complete list of liquids to avoid. Contact your food and nutrition expert for more information. Summary  A clear liquid diet is a diet that includes only liquids that you can see through.  The goal of this diet is to help you recover.  Make sure to avoid liquids with milk, cream, or pulp while you are on this diet. This information is not intended to replace advice given to you by your health care provider. Make sure you discuss any questions you have with your health  care provider. Document Released: 05/30/2008 Document Revised: 01/29/2016 Document Reviewed: 05/14/2013 Elsevier Interactive Patient Education  2017 [employer].

## 2017-03-06 NOTE — Progress Notes (Addendum)
Subjective:  Patient ID: Shane [name], male    DOB: 02-18-1946  Age: 71 y.o. MRN: [id number]  CC: Blood In Stools (blood in stool bright and dark color. going on yesterday---took miralax 1 does--)   Rectal Bleeding   The current episode started 5 to 7 days ago. The onset was gradual. The problem occurs frequently. The problem has been gradually worsening. The pain is moderate. The stool is described as hard and mixed with blood. There was no prior successful therapy. Prior unsuccessful therapies include laxatives. Associated symptoms include abdominal pain, hemorrhoids and rectal pain. Pertinent negatives include no anorexia, no fever, no diarrhea, no hematemesis, no nausea, no vomiting, no hematuria, no chest pain, no coughing, no difficulty breathing and no rash. He has been eating and drinking normally. Urine output has been normal. The last void occurred less than 6 hours ago. His past medical history does not include recent antibiotic use, recent change in diet or a recent illness. There were no sick contacts.  last colonoscopy  (colon polyp removed).  Outpatient Medications Prior to Visit  Medication Sig Dispense Refill  . albuterol (PROVENTIL HFA;VENTOLIN HFA) 108 (90 Base) MCG/ACT inhaler Inhale 2 puffs every 4 hours as needed for cough or wheeze 8.5 g 1  . escitalopram (LEXAPRO) 10 MG tablet Take 10 mg by mouth daily.    Marland Kitchen esomeprazole (NEXIUM) 40 MG capsule Take 1 capsule (40 mg total) by mouth daily at 12 noon. 30 capsule 5  . Fluticasone Furoate (ARNUITY ELLIPTA) 200 MCG/ACT AEPB Inhale 1 Dose into the lungs daily. 30 each 5  . hydrocortisone 2.5 % cream Apply topically 2 (two) times daily. 30 g 2  . ketoconazole (NIZORAL) 2 % shampoo Apply topically as directed.    . meloxicam (MOBIC) 7.5 MG tablet Take 1 tablet (7.5 mg total) by mouth daily as needed for pain. With food 30 tablet 0  . montelukast (SINGULAIR) 10 MG tablet TAKE 1 TABLET BY MOUTH AT BEDTIME 30 tablet 5  .  omeprazole (PRILOSEC) 40 MG capsule Take 1 capsule (40 mg total) by mouth daily. 30 capsule 3  . Propylene Glycol (SYSTANE BALANCE OP) Apply to eye. Reported on 12/19/2015    . triamcinolone (NASACORT ALLERGY 24HR) 55 MCG/ACT AERO nasal inhaler Use one spray in each nostril once daily as directed. 1 Inhaler 5  . vitamin B-12 (CYANOCOBALAMIN) 1000 MCG tablet Take 1 tablet (1,000 mcg total) by mouth daily. 90 tablet 3  . amoxicillin-clavulanate (AUGMENTIN) 875-125 MG tablet Take one tablet by mouth twice daily for 20 days. 40 tablet 0  . fluticasone (FLONASE) 50 MCG/ACT nasal spray Place 2 sprays into both nostrils daily.    . ranitidine (ZANTAC) 300 MG tablet Take 1 tablet (300 mg total) by mouth at bedtime. (Patient not taking: Reported on 03/06/2017) 30 tablet 5   No facility-administered medications prior to visit.     ROS See HPI  Objective:  BP 132/80   Pulse 64   Temp 98.9 F (37.2 C)   Ht 6\' 2"  (1.88 m)   Wt 269 lb (122 kg)   SpO2 97%   BMI 34.54 kg/m   BP Readings from Last 3 Encounters:  03/06/17 132/80  01/28/17 124/70  01/07/17 130/80    Wt Readings from Last 3 Encounters:  03/06/17 269 lb (122 kg)  01/07/17 273 lb (123.8 kg)  11/21/16 278 lb (126.1 kg)    Physical Exam  Constitutional: He is oriented to person, place, and time. No distress.  Cardiovascular: Normal rate.   Pulmonary/Chest: Effort normal. No respiratory distress.  Abdominal: Soft. He exhibits no distension. There is tenderness. There is no rebound and no guarding.  Genitourinary: Rectal exam shows guaiac positive stool. Rectal exam shows no external hemorrhoid, no internal hemorrhoid, no tenderness and anal tone normal.  Genitourinary Comments: BRPPR  Neurological: He is oriented to person, place, and time.  Vitals reviewed.   Lab Results  Component Value Date   WBC 6.2 03/06/2017   HGB 11.5 (L) 03/06/2017   HCT 36.9 (L) 03/06/2017   PLT 139.0 (L) 03/06/2017   GLUCOSE 107 (H) 03/06/2017    CHOL 116 11/21/2016   TRIG 88.0 11/21/2016   HDL 36.00 (L) 11/21/2016   LDLCALC 62 11/21/2016   ALT 8 11/21/2016   AST 18 11/21/2016   NA 142 03/06/2017   K 3.8 03/06/2017   CL 107 03/06/2017   CREATININE 1.19 03/06/2017   BUN 16 03/06/2017   CO2 29 03/06/2017   TSH 1.34 05/24/2008   PSA 0.68 05/24/2008   HGBA1C 6.4 11/21/2016    Dg Chest 2 View  Result Date: 11/23/2015 CLINICAL DATA:  71 year old male with cough and congestion. EXAM: CHEST  2 VIEW COMPARISON:  02/12/2015 FINDINGS: The cardiomediastinal silhouette is unremarkable. Mild peribronchial thickening is unchanged. There is no evidence of focal airspace disease, pulmonary edema, suspicious pulmonary nodule/mass, pleural effusion, or pneumothorax. No acute bony abnormalities are identified. IMPRESSION: No evidence of acute cardiopulmonary disease. Mild chronic peribronchial thickening. Electronically Signed   By: Margarette Canada M.D.   On: 11/23/2015 15:03    Assessment & Plan:   Shane [name] was seen today for blood in stools.  Diagnoses and all orders for this visit:  Bright red rectal bleeding -     CT Abdomen Pelvis W Contrast; Future -     CBC w/Diff; Future -     Basic metabolic panel; Future -     metroNIDAZOLE (FLAGYL) 500 MG tablet; Take 1 tablet (500 mg total) by mouth 3 (three) times daily. -     ciprofloxacin (CIPRO) 500 MG tablet; Take 1 tablet (500 mg total) by mouth 2 (two) times daily. -     Ambulatory referral to Gastroenterology  Enteritis -     CT Abdomen Pelvis W Contrast; Future -     CBC w/Diff; Future -     Basic metabolic panel; Future -     metroNIDAZOLE (FLAGYL) 500 MG tablet; Take 1 tablet (500 mg total) by mouth 3 (three) times daily. -     ciprofloxacin (CIPRO) 500 MG tablet; Take 1 tablet (500 mg total) by mouth 2 (two) times daily. -     Ambulatory referral to Gastroenterology  Proctitis -     Discontinue: hydrocortisone (ANUSOL-HC) 2.5 % rectal cream; Place 1 application rectally 2 (two)  times daily. -     metroNIDAZOLE (FLAGYL) 500 MG tablet; Take 1 tablet (500 mg total) by mouth 3 (three) times daily. -     ciprofloxacin (CIPRO) 500 MG tablet; Take 1 tablet (500 mg total) by mouth 2 (two) times daily. -     Ambulatory referral to Gastroenterology   I have discontinued Shane [name]'s ranitidine and amoxicillin-clavulanate. I am also having him start on metroNIDAZOLE and ciprofloxacin. Additionally, I am having him maintain his ketoconazole, hydrocortisone, Propylene Glycol (SYSTANE BALANCE OP), fluticasone, Fluticasone Furoate, escitalopram, omeprazole, vitamin B-12, esomeprazole, triamcinolone, meloxicam, montelukast, and albuterol.  Meds ordered this encounter  Medications  . DISCONTD: hydrocortisone (ANUSOL-HC) 2.5 % rectal  cream    Sig: Place 1 application rectally 2 (two) times daily.    Dispense:  30 g    Refill:  0    Order Specific Question:   Supervising Provider    Answer:   Cassandria Anger [[id number]]  . metroNIDAZOLE (FLAGYL) 500 MG tablet    Sig: Take 1 tablet (500 mg total) by mouth 3 (three) times daily.    Dispense:  30 tablet    Refill:  0    Order Specific Question:   Supervising Provider    Answer:   Cassandria Anger [[id number]]  . ciprofloxacin (CIPRO) 500 MG tablet    Sig: Take 1 tablet (500 mg total) by mouth 2 (two) times daily.    Dispense:  20 tablet    Refill:  0    Order Specific Question:   Supervising Provider    Answer:   Cassandria Anger [[id number]]    Follow-up: Return if symptoms worsen or fail to improve.  Wilfred Lacy, NP

## 2017-03-06 NOTE — Addendum Note (Signed)
Addended by: Leana Gamer on: 03/06/2017 04:23 PM   Modules accepted: Orders

## 2017-03-07 ENCOUNTER — Telehealth: Payer: Self-pay | Admitting: Nurse Practitioner

## 2017-03-07 ENCOUNTER — Ambulatory Visit (INDEPENDENT_AMBULATORY_CARE_PROVIDER_SITE_OTHER)
Admission: RE | Admit: 2017-03-07 | Discharge: 2017-03-07 | Disposition: A | Discharge status: Home / Self Care | Payer: PPO | Source: Ambulatory Visit | Attending: Nurse Practitioner | Admitting: Nurse Practitioner

## 2017-03-07 DIAGNOSIS — N200 Calculus of kidney: Secondary | ICD-10-CM | POA: Diagnosis not present

## 2017-03-07 DIAGNOSIS — R1032 Left lower quadrant pain: Secondary | ICD-10-CM

## 2017-03-07 DIAGNOSIS — K625 Hemorrhage of anus and rectum: Secondary | ICD-10-CM

## 2017-03-07 MED ORDER — IOPAMIDOL (ISOVUE-300) INJECTION 61%: 100.0000 mL | Freq: Once | INTRAVENOUS | Status: DC | PRN

## 2017-03-07 MED ORDER — METRONIDAZOLE 500 MG PO TABS: 500.0000 mg | ORAL_TABLET | Freq: Three times a day (TID) | ORAL | 0 refills | Status: DC

## 2017-03-07 MED ORDER — CIPROFLOXACIN HCL 500 MG PO TABS: 500.0000 mg | ORAL_TABLET | Freq: Two times a day (BID) | ORAL | 0 refills | Status: DC

## 2017-03-07 NOTE — Addendum Note (Signed)
Addended by: Leana Gamer on: 03/07/2017 09:43 AM   Modules accepted: Orders

## 2017-03-07 NOTE — Telephone Encounter (Signed)
Left vm for pt to call back, see result note.

## 2017-03-07 NOTE — Telephone Encounter (Signed)
Patient is calling requesting to speak with [facility]. He had some questions about the diet.

## 2017-03-07 NOTE — Telephone Encounter (Signed)
Spoke with pt and advise [facility] massage in result note.

## 2017-03-07 NOTE — Telephone Encounter (Signed)
:  CRITICAL VALUE STICKER  CRITICAL VALUE: [facility] called to report that pt need rectal visualization and 2 mm nonobstructive calculus lower pole right kidney. Please review entire report.    RECEIVER (on-site recipient of call):Pete  DATE & TIME NOTIFIED: 03/07/2017 at 9: 55am  MESSENGER (representative from lab):  MD NOTIFIED: Yes  TIME OF NOTIFICATION:  RESPONSE:

## 2017-03-10 DIAGNOSIS — Z87891 Personal history of nicotine dependence: Secondary | ICD-10-CM | POA: Diagnosis not present

## 2017-03-10 DIAGNOSIS — D693 Immune thrombocytopenic purpura: Secondary | ICD-10-CM | POA: Insufficient documentation

## 2017-03-10 DIAGNOSIS — K625 Hemorrhage of anus and rectum: Secondary | ICD-10-CM | POA: Insufficient documentation

## 2017-03-10 DIAGNOSIS — Z79899 Other long term (current) drug therapy: Secondary | ICD-10-CM | POA: Insufficient documentation

## 2017-03-10 DIAGNOSIS — K6289 Other specified diseases of anus and rectum: Secondary | ICD-10-CM | POA: Insufficient documentation

## 2017-03-10 LAB — CBC
HEMATOCRIT: 36.6 % — AB (ref 39.0–52.0)
HEMOGLOBIN: 12.4 g/dL — AB (ref 13.0–17.0)
MCH: 20.7 pg — AB (ref 26.0–34.0)
MCHC: 33.9 g/dL (ref 30.0–36.0)
MCV: 61 fL — ABNORMAL LOW (ref 78.0–100.0)
PLATELETS: 142 10*3/uL — AB (ref 150–400)
RBC: 6 MIL/uL — ABNORMAL HIGH (ref 4.22–5.81)
RDW: 15.6 % — ABNORMAL HIGH (ref 11.5–15.5)
WBC: 5.3 10*3/uL (ref 4.0–10.5)

## 2017-03-10 LAB — COMPREHENSIVE METABOLIC PANEL
ALBUMIN: 4.4 g/dL (ref 3.5–5.0)
ALK PHOS: 70 U/L (ref 38–126)
ALT: 19 U/L (ref 17–63)
ANION GAP: 10 (ref 5–15)
AST: 53 U/L — AB (ref 15–41)
BILIRUBIN TOTAL: 1 mg/dL (ref 0.3–1.2)
BUN: 13 mg/dL (ref 6–20)
CO2: 24 mmol/L (ref 22–32)
Calcium: 9.1 mg/dL (ref 8.9–10.3)
Chloride: 101 mmol/L (ref 101–111)
Creatinine, Ser: 1.11 mg/dL (ref 0.61–1.24)
GFR calc Af Amer: >60 mL/min (ref >60)
GFR calc non Af Amer: >60 mL/min (ref >60)
GLUCOSE: 99 mg/dL (ref 65–99)
POTASSIUM: 3.3 mmol/L — AB (ref 3.5–5.1)
SODIUM: 135 mmol/L (ref 135–145)
TOTAL PROTEIN: 7.3 g/dL (ref 6.5–8.1)

## 2017-03-10 NOTE — Telephone Encounter (Signed)
Patient is requesting to speak with [facility]. He states he still has some questions and he just needs to talk with her.

## 2017-03-10 NOTE — Telephone Encounter (Signed)
Patient states he doesn't think he can wait until 9/24 to be seen by GI, he is still bleeding and also can only have liquids--I have advised that he call GI and explain that he is still bleeding---if they still cant work him in any sooner than 9/24---patient advised to go to ED and all tests can be done there and done sooner

## 2017-03-10 NOTE — ED Triage Notes (Addendum)
Patient coming to make sure he is ok. He was on a clear liquid diet. He wants to make sure his vitals is ok because he was on a liquid diet.  Patient was suppose to check up with doctor but cant wait. Patient states that he had labs done on 03/06/2017. Patient states that he had blood in his stool.

## 2017-03-11 ENCOUNTER — Telehealth: Payer: Self-pay | Admitting: Internal Medicine

## 2017-03-11 ENCOUNTER — Emergency Department (HOSPITAL_COMMUNITY)
Admission: EM | Admit: 2017-03-11 | Discharge: 2017-03-11 | Disposition: A | Discharge status: Home / Self Care | Payer: PPO | Attending: Emergency Medicine | Admitting: Emergency Medicine

## 2017-03-11 DIAGNOSIS — K6289 Other specified diseases of anus and rectum: Secondary | ICD-10-CM

## 2017-03-11 DIAGNOSIS — K625 Hemorrhage of anus and rectum: Secondary | ICD-10-CM

## 2017-03-11 DIAGNOSIS — D693 Immune thrombocytopenic purpura: Secondary | ICD-10-CM

## 2017-03-11 HISTORY — DX: Personal history of colonic polyps: Z86.010

## 2017-03-11 HISTORY — DX: Personal history of adenomatous and serrated colon polyps: Z86.0101

## 2017-03-11 LAB — TYPE AND SCREEN
ABO/RH(D): O POS
ANTIBODY SCREEN: NEGATIVE

## 2017-03-11 LAB — POC OCCULT BLOOD, ED: FECAL OCCULT BLD: POSITIVE — AB

## 2017-03-11 LAB — ABO/RH: ABO/RH(D): O POS

## 2017-03-11 MED ORDER — CIPROFLOXACIN HCL 500 MG PO TABS
500.0000 mg | ORAL_TABLET | Freq: Once | ORAL | Status: AC
  Administered 2017-03-11: 500 mg via ORAL
  Filled 2017-03-11: qty 1

## 2017-03-11 NOTE — ED Notes (Signed)
On rectal exam-small amount yellow brown stool-hemocult to lab

## 2017-03-11 NOTE — ED Provider Notes (Signed)
Patient presented to the ER with rectal bleeding. No CP, SOB, palpitations, abd pain, rectal pain.  Face to face Exam: HEENT - PERRLA Lungs - CTAB Heart - RRR, no M/R/G Abd - S/NT/ND Neuro - alert, oriented x3  Plan: Diagnosed with proctitits by outpatient CT, on cipro/flagyl. Has GI follow up tomorrow. Vitals and labs WNL, ok for outpatient workup.   Orpah Greek, MD 03/11/17 [phone]

## 2017-03-11 NOTE — Discharge Instructions (Signed)
Continue with your antibiotics as prescribed. Follow-up with a gastroenterologist at your scheduled appointment tomorrow. Avoid ibuprofen or Aleve. You may take Tylenol as needed for discomfort. Return to the emergency department for new or concerning symptoms.

## 2017-03-11 NOTE — Telephone Encounter (Signed)
Spoke with, advise as long as he does have any dizziness,abd pain and blood clot coming out is he is ok to wait to see the GI tomorrow at 3 pm, but if he has any of those symptoms--he needs to go back to the ER. Shane [name] advise to advance to soft diet as pt can tolerant until he see GI.  Pt verbalized understand.

## 2017-03-11 NOTE — Telephone Encounter (Signed)
Pt called asking to give you a message. He said that he went to the ER yesterday and was released but he is still having some bleeding. He said that he has been working towards more solid food and added in apple sauce yesterday. He wanted to know if he could continue to add more solid food or if he should go back to the only liquid diet. He is going to see GI tomorrow but wanted to know what he needed to do in the meantime.

## 2017-03-11 NOTE — ED Provider Notes (Signed)
[place] DEPT Provider Note   CSN: [id number] Arrival date & time: 03/10/17  2004     History   Chief Complaint Chief Complaint  Patient presents with  . Rectal Bleeding    HPI Shane [name] is a 71 y.o. male.  71 year old male with a history of diabetes, hypertension, esophageal reflux, and anxiety disorder presents to the emergency department for evaluation. He was seen by his primary care doctor on 03/06/2017 for 1 week of intermittent bright red blood per rectum. He states that he has continued to have right red blood per rectum despite being placed on ciprofloxacin and Flagyl as well as a liquid diet. This was started by his primary care provider after a CT scan revealed proctitis. Patient is scheduled to see a gastroenterologist as an outpatient tomorrow. He expresses that he was too concerned to wait until this appointment and wished to be evaluated sooner. He has not had any fever, nausea, vomiting, rectal pain, abdominal pain. He is requesting a dose of ciprofloxacin as he missed his evening dose tonight.   The history is provided by the patient. No language interpreter was used.  Rectal Bleeding    Past Medical History:  Diagnosis Date  . Acute asthmatic bronchitis   . Anemia   . Anxiety disorder   . Borderline diabetes mellitus   . Borderline hypertension   . Chest wall pain   . Degenerative joint disease   . GERD (gastroesophageal reflux disease)   . History of adenomatous polyp of colon   . History of headache   . History of sinusitis   . Posttraumatic stress disorder     Patient Active Problem List   Diagnosis Date Noted  . Palpitations 04/06/2016  . Elevated blood pressure reading without diagnosis of hypertension 01/12/2016  . Sinusitis, chronic 12/19/2015  . Pain in joint, upper arm 11/23/2015  . Numbness and tingling in left arm 11/23/2015  . Asthmatic bronchitis 02/21/2015  . PTSD (post-traumatic stress disorder) 02/21/2015  .  Hemorrhoids 06/15/2014  . Anemia 03/31/2008  . GERD 03/31/2008  . Impaired fasting glucose 03/31/2008    Past Surgical History:  Procedure Laterality Date  . FINGER SURGERY Right    index  . NASAL SINUS SURGERY     x 2       Home Medications    Prior to Admission medications   Medication Sig Start Date End Date Taking? Authorizing Provider  albuterol (PROVENTIL HFA;VENTOLIN HFA) 108 (90 Base) MCG/ACT inhaler Inhale 2 puffs every 4 hours as needed for cough or wheeze 02/24/17  Yes Kozlow, Donnamarie Poag, MD  ciprofloxacin (CIPRO) 500 MG tablet Take 1 tablet (500 mg total) by mouth 2 (two) times daily. 03/07/17  Yes Nche, Charlene Brooke, NP  esomeprazole (NEXIUM) 40 MG capsule Take 1 capsule (40 mg total) by mouth daily at 12 noon. Patient taking differently: Take 40 mg by mouth daily as needed (reflux).  12/10/16  Yes Kozlow, Donnamarie Poag, MD  Fluticasone Furoate (ARNUITY ELLIPTA) 200 MCG/ACT AEPB Inhale 1 Dose into the lungs daily. 02/06/16  Yes Kozlow, Donnamarie Poag, MD  ketoconazole (NIZORAL) 2 % shampoo Apply 1 application topically 2 (two) times a week.    Yes [provider]  meloxicam (MOBIC) 7.5 MG tablet Take 1 tablet (7.5 mg total) by mouth daily as needed for pain. With food 02/05/17  Yes Nche, Charlene Brooke, NP  metroNIDAZOLE (FLAGYL) 500 MG tablet Take 1 tablet (500 mg total) by mouth 3 (three) times daily. 03/07/17  Yes Nche, Charlene Brooke, NP  montelukast (SINGULAIR) 10 MG tablet TAKE 1 TABLET BY MOUTH AT BEDTIME 02/21/17  Yes Kozlow, Donnamarie Poag, MD  PROCTOSOL HC 2.5 % rectal cream Apply 1 application topically 2 (two) times daily. 03/06/17  Yes [provider]  Propylene Glycol (SYSTANE BALANCE OP) Apply 1 drop to eye daily as needed (dry eyes). Reported on 12/19/2015   Yes [provider]  triamcinolone (NASACORT ALLERGY 24HR) 55 MCG/ACT AERO nasal inhaler Use one spray in each nostril once daily as directed. 01/28/17  Yes Kozlow, Donnamarie Poag, MD  vitamin B-12 (CYANOCOBALAMIN) 1000 MCG  tablet Take 1 tablet (1,000 mcg total) by mouth daily. 04/06/16  Yes [name], MD  hydrocortisone 2.5 % cream Apply topically 2 (two) times daily. Patient not taking: Reported on 03/11/2017 06/14/14   [name], MD  omeprazole (PRILOSEC) 40 MG capsule Take 1 capsule (40 mg total) by mouth daily. Patient not taking: Reported on 03/11/2017 04/05/16   [name], MD    Family History Family History  Problem Relation Age of Onset  . Diabetes Father   . Arthritis Mother   . Colon cancer Neg Hx   . Colon polyps Neg Hx   . Rectal cancer Neg Hx   . Stomach cancer Neg Hx     Social History Social History  Substance Use Topics  . Smoking status: Former Smoker    Packs/day: 0.50    Years: 25.00    Types: Cigarettes    Quit date: 07/02/1983  . Smokeless tobacco: Never Used  . Alcohol use No     Allergies   Patient has no known allergies.   Review of Systems Review of Systems  Gastrointestinal: Positive for hematochezia.  Ten systems reviewed and are negative for acute change, except as noted in the HPI.    Physical Exam Updated Vital Signs BP (!) 132/97 (BP Location: Left Arm)   Pulse 74   Temp 97.8 F (36.6 C) (Oral)   Resp 19   Ht 6\' 2"  (1.88 m)   Wt 122 kg (269 lb)   SpO2 98%   BMI 34.54 kg/m   Physical Exam  Constitutional: He is oriented to person, place, and time. He appears well-developed and well-nourished. No distress.  Nontoxic and in NAD  HENT:  Head: Normocephalic and atraumatic.  Eyes: Conjunctivae and EOM are normal. No scleral icterus.  Neck: Normal range of motion.  Cardiovascular: Normal rate, regular rhythm and intact distal pulses.   Pulmonary/Chest: Effort normal. No respiratory distress.  Respirations even and unlabored  Abdominal: Soft. He exhibits no distension. There is no tenderness. There is no guarding.  Soft, obese, nontender abdomen.  Genitourinary:  Genitourinary Comments: DRE performed by RN; heme+    Musculoskeletal: Normal range of motion.  Neurological: He is alert and oriented to person, place, and time. He exhibits normal muscle tone. Coordination normal.  GCS 15. Patient moving all extremities.  Skin: Skin is warm and dry. No rash noted. He is not diaphoretic. No erythema. No pallor.  Psychiatric: He has a normal mood and affect. His behavior is normal.  Nursing note and vitals reviewed.    ED Treatments / Results  Labs (all labs ordered are listed, but only abnormal results are displayed) Labs Reviewed  COMPREHENSIVE METABOLIC PANEL - Abnormal; Notable for the following:       Result Value   Potassium 3.3 (*)    AST 53 (*)    All other components within  normal limits  CBC - Abnormal; Notable for the following:    RBC 6.00 (*)    Hemoglobin 12.4 (*)    HCT 36.6 (*)    MCV 61.0 (*)    MCH 20.7 (*)    RDW 15.6 (*)    Platelets 142 (*)    All other components within normal limits  POC OCCULT BLOOD, ED - Abnormal; Notable for the following:    Fecal Occult Bld POSITIVE (*)    All other components within normal limits  TYPE AND SCREEN  ABO/RH    EKG  EKG Interpretation None       Radiology No results found.  Procedures Procedures (including critical care time)  Medications Ordered in ED Medications  ciprofloxacin (CIPRO) tablet 500 mg (not administered)     Initial Impression / Assessment and Plan / ED Course  I have reviewed the triage vital signs and the nursing notes.  Pertinent labs & imaging results that were available during my care of the patient were reviewed by me and considered in my medical decision making (see chart for details).     71 year old male, recently diagnosed with proctitis by CT scan on 03/07/2017, presents to the emergency department for well check. He has been taking ciprofloxacin and Flagyl and sticking to a liquid diet as advised by his primary care doctor. He is noted to have persistent guaiac positive stool, but has a  stable hemoglobin. Patient with a chronic thrombocytopenia which is stable from baseline. Patient denies any abdominal pain or rectal pain. He has had no outpatient fevers and is afebrile today. He is scheduled to follow-up with a gastroenterologist tomorrow. Vital signs have been stable. I believe continued outpatient follow-up is reasonable. No indication for further emergent workup at this time. Patient discharged in stable condition with no unaddressed concerns.   Final Clinical Impressions(s) / ED Diagnoses   Final diagnoses:  Proctitis  BRBPR (bright red blood per rectum)  Chronic idiopathic thrombocytopenia [facility])    New Prescriptions New Prescriptions   No medications on file     Antonietta Breach, PA-C 03/11/17 0206    Orpah Greek, MD 03/11/17 [phone]

## 2017-03-12 ENCOUNTER — Encounter: Payer: Self-pay | Admitting: Gastroenterology

## 2017-03-12 ENCOUNTER — Ambulatory Visit (INDEPENDENT_AMBULATORY_CARE_PROVIDER_SITE_OTHER): Payer: PPO | Admitting: Gastroenterology

## 2017-03-12 VITALS — BP 100/70 | HR 76 | Ht 74.0 in | Wt 260.4 lb

## 2017-03-12 DIAGNOSIS — K648 Other hemorrhoids: Secondary | ICD-10-CM

## 2017-03-12 DIAGNOSIS — Z860100 Personal history of colon polyps, unspecified: Secondary | ICD-10-CM

## 2017-03-12 DIAGNOSIS — K602 Anal fissure, unspecified: Secondary | ICD-10-CM | POA: Diagnosis not present

## 2017-03-12 DIAGNOSIS — K5900 Constipation, unspecified: Secondary | ICD-10-CM

## 2017-03-12 DIAGNOSIS — R933 Abnormal findings on diagnostic imaging of other parts of digestive tract: Secondary | ICD-10-CM

## 2017-03-12 MED ORDER — SUPREP BOWEL PREP KIT 17.5-3.13-1.6 GM/177ML PO SOLN: ORAL | 0 refills | Status: DC

## 2017-03-12 MED ORDER — AMBULATORY NON FORMULARY MEDICATION: 0 refills | Status: DC

## 2017-03-12 NOTE — Patient Instructions (Signed)
You have been scheduled for a colonoscopy. Please follow written instructions given to you at your visit today.  Please pick up your prep supplies at the pharmacy within the next 1-3 days. If you use inhalers (even only as needed), please bring them with you on the day of your procedure. Your physician has requested that you go to www.startemmi.com and enter the access code given to you at your visit today. This web site gives a general overview about your procedure. However, you should still follow specific instructions given to you by our office regarding your preparation for the procedure.  We have sent a prescription for nitroglycerin 0.125% gel to [facility]. You should apply a pea size amount to your rectum three times daily x 6-8 weeks.  [facility]'s information is below: Address: [address] [place], [place] [address]  [phone]  Please start taking a daily fiber supplement.  You may resume your regular diet.  Discontinue taking antibiotics.  If you are age 51 or older, your body mass index should be between 23-30. Your Body mass index is 33.43 kg/m. If this is out of the aforementioned range listed, please consider follow up with your Primary Care Provider.  If you are age 53 or younger, your body mass index should be between 19-25. Your Body mass index is 33.43 kg/m. If this is out of the aformentioned range listed, please consider follow up with your Primary Care Provider.   Thank you.

## 2017-03-12 NOTE — Progress Notes (Signed)
HPI :  71 y/o male with a history of GERD, colon polyps, alpha thallasemia, chronic anemia here for a new patient visit for rectal bleeding.  Last seen in 2014 by Dr. Sharlett Iles. Referred by Wilfred Lacy.  He has been having some blood in his stools for the past few days. He normally has no blood in the stools. He got constipated severely and took some Miralax last week, took some time to work. He reported he passed a very large hard stool and had significant rectal pain which bothered him. After this episode he continued to noted some blood in the stools. He thinks he had red blood mixed in with the stool. He had some loose stools after taking the laxatives, although states the diarrhea has since slowed down. He went to the ER last night to ensure no significant blood loss, Hgb was stable at 12.4. His last bowel movement was just prior to this visit this afternoon, no blood. No abdominal pains. No rectal pains at present. He states he never had abdominal pains throughout this course  No FH of CRC. 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 He denies any fevers. WBC normal. He denies any urgency.   EGD 09/23/12 - small hiatal hernia, otherwise normal Colonoscopy 09/23/12 - sigmoid adenoma x 1, otherwise normal CT scan 03/07/17 - ? proctitis, and thickened jejunum, c/w enteritis, ? Thickened bladder  Past Medical History:  Diagnosis Date  . Acute asthmatic bronchitis   . Anemia   . Anxiety disorder   . Borderline diabetes mellitus   . Borderline hypertension   . Chest wall pain   . Degenerative joint disease   . GERD (gastroesophageal reflux disease)   . History of adenomatous polyp of colon   . History of headache   . History of sinusitis   . Posttraumatic stress disorder      Past Surgical History:  Procedure Laterality Date  . FINGER SURGERY Right    index  . NASAL SINUS SURGERY     x 2   Family History  Problem  Relation Age of Onset  . Diabetes Father   . Arthritis Mother   . Colon cancer Neg Hx   . Colon polyps Neg Hx   . Rectal cancer Neg Hx   . Stomach cancer Neg Hx    Social History  Substance Use Topics  . Smoking status: Former Smoker    Packs/day: 0.50    Years: 25.00    Types: Cigarettes    Quit date: 07/02/1983  . Smokeless tobacco: Never Used  . Alcohol use No   Current Outpatient Prescriptions  Medication Sig Dispense Refill  . albuterol (PROVENTIL HFA;VENTOLIN HFA) 108 (90 Base) MCG/ACT inhaler Inhale 2 puffs every 4 hours as needed for cough or wheeze 8.5 g 1  . ciprofloxacin (CIPRO) 500 MG tablet Take 1 tablet (500 mg total) by mouth 2 (two) times daily. 20 tablet 0  . esomeprazole (NEXIUM) 40 MG capsule Take 1 capsule (40 mg total) by mouth daily at 12 noon. (Patient taking differently: Take 40 mg by mouth daily as needed (reflux). ) 30 capsule 5  . Fluticasone Furoate (ARNUITY ELLIPTA) 200 MCG/ACT AEPB Inhale 1 Dose into the lungs daily. 30 each 5  . hydrocortisone 2.5 % cream Apply topically 2 (two) times daily. 30 g 2  . ketoconazole (NIZORAL) 2 % shampoo Apply 1 application topically 2 (two) times a week.     Marland Kitchen  meloxicam (MOBIC) 7.5 MG tablet Take 1 tablet (7.5 mg total) by mouth daily as needed for pain. With food 30 tablet 0  . metroNIDAZOLE (FLAGYL) 500 MG tablet Take 1 tablet (500 mg total) by mouth 3 (three) times daily. 30 tablet 0  . montelukast (SINGULAIR) 10 MG tablet TAKE 1 TABLET BY MOUTH AT BEDTIME 30 tablet 5  . omeprazole (PRILOSEC) 40 MG capsule Take 1 capsule (40 mg total) by mouth daily. 30 capsule 3  . Propylene Glycol (SYSTANE BALANCE OP) Apply 1 drop to eye daily as needed (dry eyes). Reported on 12/19/2015    . triamcinolone (NASACORT ALLERGY 24HR) 55 MCG/ACT AERO nasal inhaler Use one spray in each nostril once daily as directed. 1 Inhaler 5  . vitamin B-12 (CYANOCOBALAMIN) 1000 MCG tablet Take 1 tablet (1,000 mcg total) by mouth daily. 90 tablet 3    No current facility-administered medications for this visit.    No Known Allergies   Review of Systems: All systems reviewed and negative except where noted in HPI.    Ct Abdomen Pelvis W Contrast  Result Date: 03/07/2017 CLINICAL DATA:  Rectal bleeding for 1 week. Intermittent rectal bleeding prior. EXAM: CT ABDOMEN AND PELVIS WITH CONTRAST TECHNIQUE: Multidetector CT imaging of the abdomen and pelvis was performed using the standard protocol following bolus administration of intravenous contrast. Oral contrast was administered. CONTRAST:  100 mL Isovue-300 nonionic COMPARISON:  None. FINDINGS: Lower chest: Visualized lung bases are clear. Hepatobiliary: No focal liver lesions are appreciable on this study. Gallbladder wall is not appreciably thickened. There is no biliary duct dilatation. Pancreas: No pancreatic mass or inflammatory focus. Spleen: No splenic lesions are evident. Adrenals/Urinary Tract: Adrenals appear within normal limits bilaterally. Kidneys bilaterally show no appreciable mass or hydronephrosis on either side. There is a 2 mm nonobstructing calculus in the lower pole of the right kidney. There is no ureteral calculus on either side. Urinary bladder is midline. Urinary bladder wall thickness is upper normal to borderline increased. Stomach/Bowel: There is diffuse wall thickening in the rectum with apparent edema in the wall of the rectum. There is slight adjacent perirectal soft tissue stranding. No similar wall thickening or stranding noted elsewhere in the colon. There are multiple loops of jejunum which show wall thickening but no surrounding mesenteric thickening or edema. There is no evident bowel obstruction. No free air or portal venous air. Vascular/Lymphatic: There are foci of atherosclerotic calcification in the aorta and common iliac arteries. No aneurysm. Calcification is noted in each distal common femoral artery. Major mesenteric vessels appear patent. There are mildly  prominent retroperitoneal lymph nodes on the left at the mid left kidney level, largest measuring 1.4 x 1.3 cm. No adenopathy is seen elsewhere in the abdomen or pelvis. Reproductive: There are multiple prostatic calculi. Prostate and seminal vesicles appear normal in size and contour. No pelvic mass evident. Other: Appendix appears unremarkable. There is no appreciable abscess or ascites in the abdomen or pelvis. There is a small ventral hernia containing only fat. There is fat in each inguinal ring. Musculoskeletal: There is degenerative change in the lumbar spine with disc space narrowing moderately severe at L3-4. There are no blastic or lytic bone lesions. There is no intramuscular or abdominal wall lesion. IMPRESSION: 1. 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 There is wall thickening with edema in the rectum with mild perirectal soft tissue stranding. A well-defined mass is not seen in this area. This finding may warrant direct visualization to further assess. Indeed,  the entire colon may warrant direct visualization after appropriate treatment for acute proctitis; cleansing given more intermittent rectal bleeding. 2. Loops of thickened jejunum at several sites consistent with a degree of enteritis. No bowel obstruction. 3.  No abscess in the abdomen pelvis.  Appendix appears normal. 4. Urinary bladder wall upper normal to borderline thickened. Advise correlation with urinalysis to assess for possible early cystitis. 5. 2 mm nonobstructing calculus lower pole right kidney. No hydronephrosis or ureteral calculus on either side. 6. Small ventral hernia containing only fat. Fat noted in each inguinal ring. 7. Mild lymph node prominence in the left retroperitoneal region at the level of the left kidney. Etiology uncertain. Question reactive lymph node prominence due to areas of enteritis and proctitis. 8. Aortoiliac atherosclerosis. Foci of atherosclerosis also noted in each common femoral artery. Aortic  Atherosclerosis (ICD10-I70.0). These results will be called to the ordering clinician or representative by the Radiologist Assistant, and communication documented in the PACS or zVision Dashboard. Electronically Signed   By: Lowella Grip III M.D.   On: 03/07/2017 09:33    CBC Latest Ref Rng & Units 03/10/2017 03/06/2017 11/21/2016  WBC 4.0 - 10.5 K/uL 5.3 6.2 5.4  Hemoglobin 13.0 - 17.0 g/dL 12.4(L) 11.5(L) 11.9(L)  Hematocrit 39.0 - 52.0 % 36.6(L) 36.9(L) 37.7(L)  Platelets 150 - 400 K/uL 142(L) 139.0(L) 150.0    Lab Results  Component Value Date   CREATININE 1.11 03/10/2017   BUN 13 03/10/2017   NA 135 03/10/2017   K 3.3 (L) 03/10/2017   CL 101 03/10/2017   CO2 24 03/10/2017    Physical Exam: BP 100/70   Pulse 76   Ht 6\' 2"  (1.88 m)   Wt 260 lb 6.4 oz (118.1 kg)   BMI 33.43 kg/m  Constitutional: Pleasant,well-developed, male in no acute distress. HEENT: Normocephalic and atraumatic. Conjunctivae are normal. No scleral icterus. Neck supple.  Cardiovascular: Normal rate, regular rhythm.  Pulmonary/chest: Effort normal and breath sounds normal. No wheezing, rales or rhonchi. Abdominal: Soft, nondistended, nontender.  There are no masses palpable. No hepatomegaly. DRE / Anoscopy - anal fissure appreciated deep in anal anal, inflamed internal hemorrhoids, no mass lesion Extremities: no edema Lymphadenopathy: No cervical adenopathy noted. Neurological: Alert and oriented to person place and time. Skin: Skin is warm and dry. No rashes noted. Psychiatric: Normal mood and affect. Behavior is normal.   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 He reports onset of rectal bleeding and rectal pain after passing a large stool in the setting of constipation. Symptoms appear to have improved over time, his hemoglobin is stable. CT scan shows questionable enteritis  and questionable proctitis. He doesn't have any abdominal pain, nor symptoms of proctitis. I think he can stop his antibiotics, his white blood cell count is normal. On anoscopy he has an anal fissure and inflamed internal hemorrhoids, I suspect this is the likely cause of his bleeding in the setting of constipation. However, he is due for surveillance colonoscopy within 6 months for a history of adenomas, I offered him a colonoscopy now given his CT scan scan findings and bleeding to ensure no other pathology. Following discussion of the risks benefits of colonoscopy he wanted to proceed. In the interim he can resume a regular diet. Recommend daily fiber supplementation, and use of 0.125% nitroglycerin ointment TID to treat fissure.   If his symptoms persist in the interim  despite this regimen he can contact me for reassessment.  [place] Cellar, MD [facility] Pager [phone]  CC: Lorayne Marek Charlene Brooke, NP

## 2017-03-26 ENCOUNTER — Encounter: Payer: Self-pay | Admitting: Gastroenterology

## 2017-03-31 ENCOUNTER — Telehealth: Payer: Self-pay | Admitting: Gastroenterology

## 2017-03-31 NOTE — Telephone Encounter (Signed)
Patient called today, he wanted to make sure we were aware of his history of irregular heartbeats and SOB. States he is not currently having any issues with this. He has procedure scheduled on 10/3 at [facility]. Had to repeatedly go over verbally when he should take his medications, he does have printed out prep instructions that he received when he had office visit.

## 2017-04-01 ENCOUNTER — Telehealth: Payer: Self-pay | Admitting: Gastroenterology

## 2017-04-01 NOTE — Telephone Encounter (Signed)
Pt. Wanted to know when he should stop nitrogycerin ointment.  States he's been using three times daily.  Advise to use today and leave off tomorrow due to prep.  He also wanted to know exactly what he could have to eat today.  I advise clear liquids only, suggesting jello, popsicles, chicken broth, and clear juices.  He verbalized understanding.

## 2017-04-01 NOTE — Telephone Encounter (Signed)
If patient is not symptomatic from this and otherwise at his baseline, okay to proceed. thanks

## 2017-04-02 ENCOUNTER — Encounter: Payer: Self-pay | Admitting: Gastroenterology

## 2017-04-02 ENCOUNTER — Ambulatory Visit (AMBULATORY_SURGERY_CENTER): Payer: PPO | Admitting: Gastroenterology

## 2017-04-02 VITALS — BP 122/76 | HR 57 | Temp 96.2°F | Resp 10 | Ht 74.0 in | Wt 260.0 lb

## 2017-04-02 DIAGNOSIS — D123 Benign neoplasm of transverse colon: Secondary | ICD-10-CM

## 2017-04-02 DIAGNOSIS — K625 Hemorrhage of anus and rectum: Secondary | ICD-10-CM | POA: Diagnosis not present

## 2017-04-02 DIAGNOSIS — Z86010 Personal history of colonic polyps: Secondary | ICD-10-CM | POA: Diagnosis not present

## 2017-04-02 DIAGNOSIS — F4310 Post-traumatic stress disorder, unspecified: Secondary | ICD-10-CM | POA: Diagnosis not present

## 2017-04-02 DIAGNOSIS — Z8673 Personal history of transient ischemic attack (TIA), and cerebral infarction without residual deficits: Secondary | ICD-10-CM | POA: Diagnosis not present

## 2017-04-02 DIAGNOSIS — D122 Benign neoplasm of ascending colon: Secondary | ICD-10-CM | POA: Diagnosis not present

## 2017-04-02 DIAGNOSIS — D120 Benign neoplasm of cecum: Secondary | ICD-10-CM | POA: Diagnosis not present

## 2017-04-02 DIAGNOSIS — I2510 Atherosclerotic heart disease of native coronary artery without angina pectoris: Secondary | ICD-10-CM | POA: Diagnosis not present

## 2017-04-02 MED ORDER — SODIUM CHLORIDE 0.9 % IV SOLN: 500.0000 mL | INTRAVENOUS | Status: DC

## 2017-04-02 NOTE — Progress Notes (Signed)
Called to room to assist during endoscopic procedure.  Patient ID and intended procedure confirmed with present staff. Received instructions for my participation in the procedure from the performing physician.  

## 2017-04-02 NOTE — Op Note (Signed)
[facility] Patient Name: Shane [name] Procedure Date: 04/02/2017 2:33 PM MRN: [id number] Endoscopist: Remo Lipps P. Armbruster MD, MD Age: 71 Referring MD:  Date of Birth: [birth year] Gender: Male Account #: [id number] Procedure:                Colonoscopy Indications:              Hematochezia, Abnormal CT of the GI tract -                            reported "proctitis", history of polyps Medicines:                Monitored Anesthesia Care Procedure:                Pre-Anesthesia Assessment:                           - Prior to the procedure, a History and Physical                            was performed, and patient medications and                            allergies were reviewed. The patient's tolerance of                            previous anesthesia was also reviewed. The risks                            and benefits of the procedure and the sedation                            options and risks were discussed with the patient.                            All questions were answered, and informed consent                            was obtained. Prior Anticoagulants: The patient has                            taken no previous anticoagulant or antiplatelet                            agents. ASA Grade Assessment: III - A patient with                            severe systemic disease. After reviewing the risks                            and benefits, the patient was deemed in                            satisfactory condition to undergo the procedure.  After obtaining informed consent, the colonoscope                            was passed under direct vision. Throughout the                            procedure, the patient's blood pressure, pulse, and                            oxygen saturations were monitored continuously. The                            Colonoscope was introduced through the anus and                            advanced to the  the terminal ileum, with                            identification of the appendiceal orifice and IC                            valve. The colonoscopy was performed without                            difficulty. The patient tolerated the procedure                            well. The quality of the bowel preparation was                            good. The terminal ileum, the appendiceal orifice                            and the rectum were photographed. Scope In: 2:37:05 PM Scope Out: 2:54:20 PM Scope Withdrawal Time: 0 hours 15 minutes 14 seconds  Total Procedure Duration: 0 hours 17 minutes 15 seconds  Findings:                 The perianal and digital rectal examinations were                            normal. Previously noted anal fissure has since                            healed.                           The terminal ileum appeared normal.                           Two sessile polyps were found in the cecum. The                            polyps were 2 to 4 mm in size. These polyps were  removed with a cold snare. Resection and retrieval                            were complete.                           A 5 mm polyp was found in the ascending colon. The                            polyp was sessile. The polyp was removed with a                            cold snare. Resection and retrieval were complete.                           Two sessile polyps were found in the transverse                            colon. The polyps were 3 to 4 mm in size. These                            polyps were removed with a cold snare. Resection                            and retrieval were complete.                           Internal hemorrhoids were found during retroflexion                            with mild erythema around the dentate line.                           The exam was otherwise without abnormality. Complications:            No immediate complications.  Estimated blood loss:                            Minimal. Estimated Blood Loss:     Estimated blood loss was minimal. Impression:               - Two 2 to 4 mm polyps in the cecum, removed with a                            cold snare. Resected and retrieved.                           - One 5 mm polyp in the ascending colon, removed                            with a cold snare. Resected and retrieved.                           - Two 3 to 4  mm polyps in the transverse colon,                            removed with a cold snare. Resected and retrieved.                           - Internal hemorrhoids.                           - Interval healing of anal fissure                           - The examination was otherwise normal.                           Overall, suspect bleeding was due to previously                            noted anal fissure which has since healed on                            therapy, versus hemorrhoids. Recommendation:           - Patient has a contact number available for                            emergencies. The signs and symptoms of potential                            delayed complications were discussed with the                            patient. Return to normal activities tomorrow.                            Written discharge instructions were provided to the                            patient.                           - Resume previous diet.                           - Continue present medications.                           - Await pathology results.                           - Repeat colonoscopy is recommended for                            surveillance. The colonoscopy date will be                            determined after pathology results from  today's                            exam become available for review.                           - No ibuprofen, naproxen, or other non-steroidal                            anti-inflammatory drugs for 2 weeks after  polyp                            removal. Remo Lipps P. Armbruster MD, MD 04/02/2017 3:05:12 PM This report has been signed electronically.

## 2017-04-02 NOTE — Patient Instructions (Signed)
YOU HAD AN ENDOSCOPIC PROCEDURE TODAY AT THE [facility]:   Refer to the procedure report that was given to you for any specific questions about what was found during the examination.  If the procedure report does not answer your questions, please call your gastroenterologist to clarify.  If you requested that your care partner not be given the details of your procedure findings, then the procedure report has been included in a sealed envelope for you to review at your convenience later.  YOU SHOULD EXPECT: Some feelings of bloating in the abdomen. Passage of more gas than usual.  Walking can help get rid of the air that was put into your GI tract during the procedure and reduce the bloating. If you had a lower endoscopy (such as a colonoscopy or flexible sigmoidoscopy) you may notice spotting of blood in your stool or on the toilet paper. If you underwent a bowel prep for your procedure, you may not have a normal bowel movement for a few days.  Please Note:  You might notice some irritation and congestion in your nose or some drainage.  This is from the oxygen used during your procedure.  There is no need for concern and it should clear up in a day or so.  SYMPTOMS TO REPORT IMMEDIATELY:   Following lower endoscopy (colonoscopy or flexible sigmoidoscopy):  Excessive amounts of blood in the stool  Significant tenderness or worsening of abdominal pains  Swelling of the abdomen that is new, acute  Fever of 100F or higher   Please read all handouts given to you by your recovery nurse.  For urgent or emergent issues, a gastroenterologist can be reached at any hour by calling [phone].   DIET:  We do recommend a small meal at first, but then you may proceed to your regular diet.  Drink plenty of fluids but you should avoid alcoholic beverages for 24 hours.  ACTIVITY:  You should plan to take it easy for the rest of today and you should NOT DRIVE or use heavy machinery until  tomorrow (because of the sedation medicines used during the test).    FOLLOW UP: Our staff will call the number listed on your records the next business day following your procedure to check on you and address any questions or concerns that you may have regarding the information given to you following your procedure. If we do not reach you, we will leave a message.  However, if you are feeling well and you are not experiencing any problems, there is no need to return our call.  We will assume that you have returned to your regular daily activities without incident.  If any biopsies were taken you will be contacted by phone or by letter within the next 1-3 weeks.  Please call us at [phone] if you have not heard about the biopsies in 3 weeks.    SIGNATURES/CONFIDENTIALITY: You and/or your care partner have signed paperwork which will be entered into your electronic medical record.  These signatures attest to the fact that that the information above on your After Visit Summary has been reviewed and is understood.  Full responsibility of the confidentiality of this discharge information lies with you and/or your care-partner.  Thank you for letting us take care of your healthcare needs today. 

## 2017-04-02 NOTE — Progress Notes (Signed)
Report given to PACU, vss 

## 2017-04-03 ENCOUNTER — Telehealth: Payer: Self-pay

## 2017-04-03 NOTE — Telephone Encounter (Signed)
  Follow up Call-  Call back number 04/02/2017  Post procedure Call Back phone  # [phone]  Permission to leave phone message Yes  Some recent data might be hidden     Patient questions:  Do you have a fever, pain , or abdominal swelling? No. Pain Score  0 *  Have you tolerated food without any problems? Yes.    Have you been able to return to your normal activities? Yes.    Do you have any questions about your discharge instructions: Diet   No. Medications  No. Follow up visit  No.  Do you have questions or concerns about your Care? No.  Actions: * If pain score is 4 or above: No action needed, pain <4.

## 2017-04-08 ENCOUNTER — Encounter: Payer: Self-pay | Admitting: Gastroenterology

## 2017-04-15 ENCOUNTER — Other Ambulatory Visit: Payer: Self-pay | Admitting: Internal Medicine

## 2017-06-03 ENCOUNTER — Ambulatory Visit: Payer: PPO | Admitting: Allergy and Immunology

## 2017-07-03 ENCOUNTER — Ambulatory Visit (INDEPENDENT_AMBULATORY_CARE_PROVIDER_SITE_OTHER): Payer: PPO | Admitting: Internal Medicine

## 2017-07-03 ENCOUNTER — Encounter: Payer: Self-pay | Admitting: Internal Medicine

## 2017-07-03 DIAGNOSIS — J4520 Mild intermittent asthma, uncomplicated: Secondary | ICD-10-CM | POA: Diagnosis not present

## 2017-07-03 DIAGNOSIS — J324 Chronic pansinusitis: Secondary | ICD-10-CM

## 2017-07-03 NOTE — Assessment & Plan Note (Signed)
No indication for antibiotics or steroids today. Talked with him about [facility] to talk to them about surgical interventions.

## 2017-07-03 NOTE — Patient Instructions (Signed)
We will have you add zyrtec (cetirizine) once a day to help the sinus drainage. You do not need antibiotic or steroid today.

## 2017-07-03 NOTE — Assessment & Plan Note (Signed)
No indication for antibiotics or steroids today. Advised to add zyrtec to arnuity, singulair, mucinex. He is also doing nasal saline as well which he will continue. Chest is clear on exam.

## 2017-07-03 NOTE — Progress Notes (Signed)
   Subjective:    Patient ID: Shane [name], male    DOB: 06-16-1946, 72 y.o.   MRN: [id number]  HPI The patient is a 72 YO man coming in for cough and sinus congestion going on for about 1 week. He does take his arnuity daily and nasacort and singulair and mucinex all the time and has been taking these. He wants to know my opinion about a nasal procedure to help open his sinuses and if I know anyone that does it. He denies using albuterol more lately. He denies fevers or chills. No sinus tenderness. Some sputum production. Overall symptoms are improving gradually.   Review of Systems  Constitutional: Negative for activity change, appetite change, chills, fatigue, fever and unexpected weight change.  HENT: Positive for congestion, postnasal drip and rhinorrhea. Negative for ear discharge, ear pain, sinus pressure, sinus pain, sneezing, sore throat, tinnitus, trouble swallowing and voice change.   Eyes: Negative.   Respiratory: Positive for cough. Negative for chest tightness, shortness of breath and wheezing.   Cardiovascular: Negative.   Gastrointestinal: Negative.   Neurological: Negative.       Objective:   Physical Exam  Constitutional: He is oriented to person, place, and time. He appears well-developed and well-nourished.  HENT:  Head: Normocephalic and atraumatic.  Oropharynx with redness and clear drainage, nose with swollen turbinates, TMs normal bilaterally  Eyes: EOM are normal.  Neck: Normal range of motion. No thyromegaly present.  Cardiovascular: Normal rate and regular rhythm.  Pulmonary/Chest: Effort normal and breath sounds normal. No respiratory distress. He has no wheezes. He has no rales.  Abdominal: Soft.  Lymphadenopathy:    He has no cervical adenopathy.  Neurological: He is alert and oriented to person, place, and time.  Skin: Skin is warm and dry.   Vitals:   07/03/17 0952  BP: 138/88  Pulse: (!) 55  Temp: 98.7 F (37.1 C)  TempSrc: Oral  SpO2: 99%   Weight: 275 lb (124.7 kg)  Height: 6\' 2"  (1.88 m)      Assessment & Plan:

## 2017-07-12 DIAGNOSIS — L03011 Cellulitis of right finger: Secondary | ICD-10-CM | POA: Diagnosis not present

## 2017-07-12 DIAGNOSIS — L03019 Cellulitis of unspecified finger: Secondary | ICD-10-CM | POA: Diagnosis not present

## 2017-07-29 ENCOUNTER — Ambulatory Visit (INDEPENDENT_AMBULATORY_CARE_PROVIDER_SITE_OTHER): Payer: PPO | Admitting: Allergy and Immunology

## 2017-07-29 ENCOUNTER — Encounter: Payer: Self-pay | Admitting: Allergy and Immunology

## 2017-07-29 VITALS — BP 124/82 | HR 61 | Resp 16

## 2017-07-29 DIAGNOSIS — K219 Gastro-esophageal reflux disease without esophagitis: Secondary | ICD-10-CM | POA: Diagnosis not present

## 2017-07-29 DIAGNOSIS — J3089 Other allergic rhinitis: Secondary | ICD-10-CM

## 2017-07-29 DIAGNOSIS — J4530 Mild persistent asthma, uncomplicated: Secondary | ICD-10-CM

## 2017-07-29 NOTE — Patient Instructions (Addendum)
  1. Treat and prevent inflammation:   A. OTC RHINOCORT one spray each nostril one time per day  B. montelukast 10 mg one tablet once a day  C. Breo 200 one inhalation 1 time per day.    2. Treat and prevent reflux:   A. NEXIUM 40mg  one tablet in AM  3. If needed:   A. nasal saline  B. OTC antihistamine  - Allegra    C. Proventil HFA 2 puffs every 4-6 hours  D. OTC Mucinex DM 2 tablets twice a day  4. Return to clinic in 4 weeks or earlier if problem

## 2017-07-29 NOTE — Progress Notes (Signed)
Follow-up Note  Referring Provider: [name], * Primary Provider: [name], MD Date of Office Visit: 07/29/2017  Subjective:   Shane [name] (DOB: 21-Apr-1946) is a 72 y.o. male who returns to the [facility] on 07/29/2017 in re-evaluation of the following:  HPI: Shane [name] returns to this clinic in reevaluation of asthma and allergic rhinitis and LPR.  I last saw him in this clinic 28 January 2017.  He has had a significant change in his health status.  There is a somewhat meandering story about a GI bleed which may have been a anal fissure back in the fall and he has had a memory issue ever since that event.  He is very nondescript about almost all of his symptoms and the timing of his symptoms and he has a difficult time placing events over the course of the past 6 months or so.  He complains of having problems with his respiratory tract.  Apparently he has been having recurrent episodes of nasal congestion and cough and he may have received antibiotics in the treatment of this condition.  It does not sound as though he consistently uses preventative anti-inflammatory medications for either his upper or lower airway.  When I last saw him in his clinic he was undergoing evaluation for CPAP use with a titration study but apparently he does not use CPAP at this point in time for some ill-defined reason.  He may be having issues with nasal congestion that prevents him from using the CPAP machine.  He does not have any problems with reflux.  Allergies as of 07/29/2017   No Known Allergies     Medication List      albuterol 108 (90 Base) MCG/ACT inhaler Commonly known as:  PROVENTIL HFA;VENTOLIN HFA Inhale 2 puffs every 4 hours as needed for cough or wheeze   [facility] VITAMIN B12 1000 MCG tablet Generic drug:  cyanocobalamin TAKE 1 TABLET (1,000 MCG TOTAL) BY MOUTH DAILY.   esomeprazole 40 MG capsule Commonly known as:  NEXIUM Take 1 capsule  (40 mg total) by mouth daily at 12 noon.   Fluticasone Furoate 200 MCG/ACT Aepb Commonly known as:  ARNUITY ELLIPTA Inhale 1 Dose into the lungs daily.   hydrocortisone 2.5 % cream Apply topically 2 (two) times daily.   IRON (FERROUS SULFATE) PO Take by mouth.   ketoconazole 2 % shampoo Commonly known as:  NIZORAL Apply 1 application topically 2 (two) times a week.   meloxicam 7.5 MG tablet Commonly known as:  MOBIC Take 1 tablet (7.5 mg total) by mouth daily as needed for pain. With food   montelukast 10 MG tablet Commonly known as:  SINGULAIR TAKE 1 TABLET BY MOUTH AT BEDTIME   omeprazole 40 MG capsule Commonly known as:  PRILOSEC Take 1 capsule (40 mg total) by mouth daily.   SYSTANE BALANCE OP Apply 1 drop to eye daily as needed (dry eyes). Reported on 12/19/2015   triamcinolone 55 MCG/ACT Aero nasal inhaler Commonly known as:  NASACORT ALLERGY 24HR Use one spray in each nostril once daily as directed.       Past Medical History:  Diagnosis Date  . Acute asthmatic bronchitis   . Anemia   . Anxiety disorder   . Borderline diabetes mellitus   . Borderline hypertension   . Chest wall pain   . Degenerative joint disease   . Depression   . GERD (gastroesophageal reflux disease)   . History of adenomatous  polyp of colon   . History of headache   . History of sinusitis   . Posttraumatic stress disorder   . Sleep apnea     Past Surgical History:  Procedure Laterality Date  . COLONOSCOPY    . FINGER SURGERY Right    index  . NASAL SINUS SURGERY     x 2  . POLYPECTOMY      Review of systems negative except as noted in HPI / PMHx or noted below:  Review of Systems  Constitutional: Negative.   HENT: Negative.   Eyes: Negative.   Respiratory: Negative.   Cardiovascular: Negative.   Gastrointestinal: Negative.   Genitourinary: Negative.   Musculoskeletal: Negative.   Skin: Negative.   Neurological: Negative.   Endo/Heme/Allergies: Negative.     Psychiatric/Behavioral: Negative.      Objective:   Vitals:   07/29/17 1702  BP: 124/82  Pulse: 61  Resp: 16  SpO2: 96%          Physical Exam  Constitutional: He is well-developed, well-nourished, and in no distress.  HENT:  Head: Normocephalic.  Right Ear: Tympanic membrane, external ear and ear canal normal.  Left Ear: Tympanic membrane, external ear and ear canal normal.  Nose: Nose normal. No mucosal edema or rhinorrhea.  Mouth/Throat: Uvula is midline, oropharynx is clear and moist and mucous membranes are normal. No oropharyngeal exudate.  Eyes: Conjunctivae are normal.  Neck: Trachea normal. No tracheal tenderness present. No tracheal deviation present. No thyromegaly present.  Cardiovascular: Normal rate, regular rhythm, S1 normal, S2 normal and normal heart sounds.  No murmur heard. Pulmonary/Chest: Breath sounds normal. No stridor. No respiratory distress. He has no wheezes. He has no rales.  Musculoskeletal: He exhibits no edema.  Lymphadenopathy:       Head (right side): No tonsillar adenopathy present.       Head (left side): No tonsillar adenopathy present.    He has no cervical adenopathy.  Neurological: He is alert. Gait normal.  Skin: No rash noted. He is not diaphoretic. No erythema. Nails show no clubbing.  Psychiatric: Mood and affect normal.    Diagnostics:    Spirometry was performed and demonstrated an FEV1 of 2.76 at 83 % of predicted.  The patient had an Asthma Control Test with the following results: ACT Total Score: 18.    Assessment and Plan:   1. Asthma, well controlled, mild persistent   2. Other allergic rhinitis   3. LPRD (laryngopharyngeal reflux disease)     1. Treat and prevent inflammation:   A. OTC RHINOCORT one spray each nostril one time per day  B. montelukast 10 mg one tablet once a day  C. Breo 200 one inhalation 1 time per day.    2. Treat and prevent reflux:   A. NEXIUM 40mg  one tablet in AM  3. If  needed:   A. nasal saline  B. OTC antihistamine  - Allegra    C. Proventil HFA 2 puffs every 4-6 hours  D. OTC Mucinex DM 2 tablets twice a day  4. Return to clinic in 4 weeks or earlier if problem  I am going to have Shane [name] consistently use anti-inflammatory medications for both his upper and lower airway over the course of the next 4 weeks and will see what type of response we get at that point in time utilizing this plan.  As well, he will continue to treat his reflux with a proton pump inhibitor.  He has definitely had a significant  change in his health status especially with his memory over the course of the past year or so and hopefully this is secondary to untreated sleep apnea which can be improved with use of a CPAP.  I have encouraged him to follow-up with further treatment of his apparently untreated sleep apnea with reinitiation of his CPAP machine.  Allena Katz, MD [facility] [place]

## 2017-07-30 ENCOUNTER — Encounter: Payer: Self-pay | Admitting: Allergy and Immunology

## 2017-07-31 DIAGNOSIS — J069 Acute upper respiratory infection, unspecified: Secondary | ICD-10-CM | POA: Diagnosis not present

## 2017-07-31 DIAGNOSIS — R509 Fever, unspecified: Secondary | ICD-10-CM | POA: Diagnosis not present

## 2017-08-11 ENCOUNTER — Other Ambulatory Visit: Payer: Self-pay

## 2017-08-11 MED ORDER — MONTELUKAST SODIUM 10 MG PO TABS: 10.0000 mg | ORAL_TABLET | Freq: Every day | ORAL | 0 refills | Status: DC

## 2017-09-02 ENCOUNTER — Ambulatory Visit: Payer: PPO | Admitting: Allergy and Immunology

## 2017-09-02 ENCOUNTER — Telehealth: Payer: Self-pay | Admitting: Allergy and Immunology

## 2017-09-02 NOTE — Telephone Encounter (Signed)
Lm for pt to call us back  

## 2017-09-02 NOTE — Telephone Encounter (Signed)
Pt called and said he has a sinus infection and want to have something called into [facility]. [phone].

## 2017-09-02 NOTE — Telephone Encounter (Signed)
Please have patient utilize the following treatment for what is most likely a viral respiratory tract infection: Nasal saline at least 3 times a day, Mucinex DM 2 tablets twice a day, over-the-counter antihistamine such as Claritin 10 mg twice a day, and ibuprofen if needed.  If he does not do well as the next week goes on he may require more treatment but usually this therapy will result in improvement within 7-10 days after the onset of his symptoms.

## 2017-09-02 NOTE — Telephone Encounter (Signed)
Pt states it started Saturday. Alot of coughing, congestion. Pt states that his drainage is thick.

## 2017-09-02 NOTE — Telephone Encounter (Signed)
Left message for patient to call.

## 2017-09-03 NOTE — Telephone Encounter (Signed)
Pt informed of recommended medication plan. Will contact us if not better in 7-10 days

## 2017-09-11 ENCOUNTER — Other Ambulatory Visit: Payer: Self-pay | Admitting: Allergy and Immunology

## 2017-09-11 MED ORDER — FLUTICASONE FUROATE 200 MCG/ACT IN AEPB: 1.0000 | INHALATION_SPRAY | Freq: Every day | RESPIRATORY_TRACT | 5 refills | Status: DC

## 2017-09-11 NOTE — Telephone Encounter (Signed)
Prescription has been sent in for Oxoboxo [place]. Memory Dance is not in notes from what I could find. I left a detailed message for the patient advising him of this information.

## 2017-09-11 NOTE — Telephone Encounter (Signed)
Patient needs a refill on BREO Should he pick up samples at the office, Or will a script be called in to the pharmacy

## 2017-09-23 ENCOUNTER — Ambulatory Visit: Payer: PPO | Admitting: Allergy and Immunology

## 2017-10-01 ENCOUNTER — Telehealth: Payer: Self-pay | Admitting: Allergy and Immunology

## 2017-10-01 NOTE — Telephone Encounter (Signed)
Adjust no show fee - first time - kt

## 2017-10-01 NOTE — Telephone Encounter (Signed)
Pt came in to make a payment and did not know that he has missed appointment 09/23/2017 and made another and said he was sorry that he did not get his phone call. Can we do a write off.

## 2017-10-07 ENCOUNTER — Encounter: Payer: Self-pay | Admitting: Allergy and Immunology

## 2017-10-07 ENCOUNTER — Ambulatory Visit: Payer: PPO | Admitting: Allergy and Immunology

## 2017-10-07 VITALS — BP 130/82 | HR 60 | Resp 12

## 2017-10-07 DIAGNOSIS — J4530 Mild persistent asthma, uncomplicated: Secondary | ICD-10-CM

## 2017-10-07 DIAGNOSIS — J3089 Other allergic rhinitis: Secondary | ICD-10-CM

## 2017-10-07 DIAGNOSIS — G4733 Obstructive sleep apnea (adult) (pediatric): Secondary | ICD-10-CM

## 2017-10-07 DIAGNOSIS — K219 Gastro-esophageal reflux disease without esophagitis: Secondary | ICD-10-CM

## 2017-10-07 NOTE — Progress Notes (Signed)
Follow-up Note  Referring Provider: [name], * Primary Provider: [name], MD Date of Office Visit: 10/07/2017  Subjective:   Shane [name] (DOB: 13-Jan-1946) is a 72 y.o. male who returns to the [facility] on 10/07/2017 in re-evaluation of the following:  HPI: Shane [name] returns to this clinic in reevaluation of his nasal congestion and cough secondary to his allergic rhinitis and asthma with a component of LPR.  I last saw him in this clinic 29 July 2017 at which point in time we assigned a plan of anti-inflammatory medications for his respiratory tract and therapy directed against reflux.    Because of a logistical issue we had to change around some of his medications and he is presently using a nasal steroid and an inhaled steroid and a leukotriene modifier and continues on omeprazole consistently.  He does feel better.  He has less nasal congestion and less cough and has not required a systemic steroid or an antibiotic to treat any type of respiratory tract issue and he has no need to use a short acting bronchodilator.  When he was last seen in this clinic we had a long discussion about the need to use CPAP but after that visit he plugged in his machine and it does not work and he has not made a move to have the [place] replace his broken machine.   Allergies as of 10/07/2017   No Known Allergies     Medication List      albuterol 108 (90 Base) MCG/ACT inhaler Commonly known as:  PROVENTIL HFA;VENTOLIN HFA Inhale 2 puffs every 4 hours as needed for cough or wheeze   [facility] VITAMIN B12 1000 MCG tablet Generic drug:  cyanocobalamin TAKE 1 TABLET (1,000 MCG TOTAL) BY MOUTH DAILY.   Fluticasone Furoate 200 MCG/ACT Aepb Commonly known as:  ARNUITY ELLIPTA Inhale 1 Dose into the lungs daily.   IRON (FERROUS SULFATE) PO Take by mouth.   ketoconazole 2 % shampoo Commonly known as:  NIZORAL Apply 1 application topically 2 (two) times a  week.   meloxicam 7.5 MG tablet Commonly known as:  MOBIC Take 1 tablet (7.5 mg total) by mouth daily as needed for pain. With food   montelukast 10 MG tablet Commonly known as:  SINGULAIR Take 1 tablet (10 mg total) by mouth at bedtime.   omeprazole 40 MG capsule Commonly known as:  PRILOSEC Take 1 capsule (40 mg total) by mouth daily.   SYSTANE BALANCE OP Apply 1 drop to eye daily as needed (dry eyes). Reported on 12/19/2015   triamcinolone 55 MCG/ACT Aero nasal inhaler Commonly known as:  NASACORT ALLERGY 24HR Use one spray in each nostril once daily as directed.       Past Medical History:  Diagnosis Date  . Acute asthmatic bronchitis   . Anemia   . Anxiety disorder   . Borderline diabetes mellitus   . Borderline hypertension   . Chest wall pain   . Degenerative joint disease   . Depression   . GERD (gastroesophageal reflux disease)   . History of adenomatous polyp of colon   . History of headache   . History of sinusitis   . Posttraumatic stress disorder   . Sleep apnea     Past Surgical History:  Procedure Laterality Date  . COLONOSCOPY    . FINGER SURGERY Right    index  . NASAL SINUS SURGERY     x 2  . POLYPECTOMY  Review of systems negative except as noted in HPI / PMHx or noted below:  Review of Systems  Constitutional: Negative.   HENT: Negative.   Eyes: Negative.   Respiratory: Negative.   Cardiovascular: Negative.   Gastrointestinal: Negative.   Genitourinary: Negative.   Musculoskeletal: Negative.   Skin: Negative.   Neurological: Negative.   Endo/Heme/Allergies: Negative.   Psychiatric/Behavioral: Negative.      Objective:   Vitals:   10/07/17 1732  BP: 130/82  Pulse: 60  Resp: 12  SpO2: 94%          Physical Exam  HENT:  Head: Normocephalic.  Right Ear: Tympanic membrane, external ear and ear canal normal.  Left Ear: Tympanic membrane, external ear and ear canal normal.  Nose: Nose normal. No mucosal edema or  rhinorrhea.  Mouth/Throat: Uvula is midline, oropharynx is clear and moist and mucous membranes are normal. No oropharyngeal exudate.  Eyes: Conjunctivae are normal.  Neck: Trachea normal. No tracheal tenderness present. No tracheal deviation present. No thyromegaly present.  Cardiovascular: Normal rate, regular rhythm, S1 normal, S2 normal and normal heart sounds.  No murmur heard. Pulmonary/Chest: Breath sounds normal. No stridor. No respiratory distress. He has no wheezes. He has no rales.  Musculoskeletal: He exhibits no edema.  Lymphadenopathy:       Head (right side): No tonsillar adenopathy present.       Head (left side): No tonsillar adenopathy present.    He has no cervical adenopathy.  Neurological: He is alert.  Skin: No rash noted. He is not diaphoretic. No erythema. Nails show no clubbing.    Diagnostics:    Spirometry was performed and demonstrated an FEV1 of 3.05 at 92 % of predicted.  The patient had an Asthma Control Test with the following results: ACT Total Score: 22.    Assessment and Plan:   1. Asthma, well controlled, mild persistent   2. Other allergic rhinitis   3. LPRD (laryngopharyngeal reflux disease)   4. Sleep apnea, obstructive     1. Treat and prevent inflammation:   A. OTC Nasacort one spray each nostril one time per day  B. montelukast 10 mg one tablet once a day  C. Arnuity 200 one inhalation 1 time per day.    2. Treat and prevent reflux:   A. Omeprazole 40mg  one tablet in AM  3. If needed:   A. nasal saline  B. OTC antihistamine  - Allegra    C. Proventil HFA 2 puffs every 4-6 hours  D. OTC Mucinex DM 2 tablets twice a day  4. GET YOUR CPAP MACHINE WORKING ASAP AND USE IT EVERY NIGHT WHILE YOU SLEEP.  5. Return to clinic in summer 2019 or earlier if problem  Shane [name] appears to be doing better with his respiratory tract while consistently using anti-inflammatory medications for both his upper and lower airway and his reflux and  throat issue appears to be under good control on omeprazole.  The big issue is that he has untreated sleep apnea and he needs to get this addressed by getting a working CPAP machine in his house and using it every night.  He has very significant sleep apnea and he is just not going to do well if he does not get this addressed [place] and I have had a talk with him today about this issue.  If he does well I will see him in this clinic in the summer 2019 or earlier if there is a problem.  Allena Katz, MD  Allergy / Immunology [facility]

## 2017-10-07 NOTE — Patient Instructions (Addendum)
  1. Treat and prevent inflammation:   A. OTC Nasacort one spray each nostril one time per day  B. montelukast 10 mg one tablet once a day  C. Arnuity 200 one inhalation 1 time per day.    2. Treat and prevent reflux:   A. Omeprazole 40mg  one tablet in AM  3. If needed:   A. nasal saline  B. OTC antihistamine  - Allegra    C. Proventil HFA 2 puffs every 4-6 hours  D. OTC Mucinex DM 2 tablets twice a day  4. GET YOUR CPAP MACHINE WORKING ASAP AND USE IT EVERY NIGHT WHILE YOU SLEEP.  5. Return to clinic in summer 2019 or earlier if problem

## 2017-10-08 ENCOUNTER — Encounter: Payer: Self-pay | Admitting: Allergy and Immunology

## 2017-10-31 ENCOUNTER — Other Ambulatory Visit: Payer: Self-pay | Admitting: Allergy and Immunology

## 2017-10-31 NOTE — Telephone Encounter (Signed)
Please advise 

## 2017-10-31 NOTE — Telephone Encounter (Signed)
Patient said he was put on Arnuity Ellipta, but says he has taken [place] before, and it works better. He would like a prescription for that. [facility].

## 2017-11-03 MED ORDER — FLUTICASONE FUROATE-VILANTEROL 200-25 MCG/INH IN AEPB: 1.0000 | INHALATION_SPRAY | Freq: Every day | RESPIRATORY_TRACT | 5 refills | Status: DC

## 2017-11-03 NOTE — Telephone Encounter (Signed)
Breo 200 one inhalation one time per day

## 2017-11-03 NOTE — Telephone Encounter (Signed)
Prescription sent in as requested and left detailed message advising patient of this.

## 2017-12-01 ENCOUNTER — Other Ambulatory Visit: Payer: Self-pay | Admitting: Allergy and Immunology

## 2017-12-01 ENCOUNTER — Telehealth: Payer: Self-pay | Admitting: Emergency Medicine

## 2017-12-01 NOTE — Telephone Encounter (Signed)
Called patient to schedule AWV. Patient declined at this time. 

## 2017-12-26 ENCOUNTER — Telehealth: Payer: Self-pay | Admitting: Internal Medicine

## 2017-12-26 NOTE — Telephone Encounter (Signed)
Should have visit to discuss.

## 2017-12-26 NOTE — Telephone Encounter (Signed)
EKG from 04/05/16 states he has extra beats,  Please advise on referral

## 2017-12-26 NOTE — Telephone Encounter (Signed)
Can you please make a visit for patient. Thank you

## 2017-12-26 NOTE — Telephone Encounter (Signed)
Copied from [place] [id number]. Topic: Referral - Request >> Dec 26, 2017 12:18 PM Carolyn Stare wrote:  Pt call to ask for referral to see a heart doctor. He said he need to see one for his irregular heart beat   [phone]

## 2017-12-29 NOTE — Telephone Encounter (Signed)
LVM for patient to make appt wirht PCP to discuss cardiology referral

## 2018-01-02 ENCOUNTER — Other Ambulatory Visit (INDEPENDENT_AMBULATORY_CARE_PROVIDER_SITE_OTHER): Payer: PPO

## 2018-01-02 ENCOUNTER — Ambulatory Visit (INDEPENDENT_AMBULATORY_CARE_PROVIDER_SITE_OTHER): Payer: PPO | Admitting: Internal Medicine

## 2018-01-02 ENCOUNTER — Encounter: Payer: Self-pay | Admitting: Internal Medicine

## 2018-01-02 VITALS — BP 136/82 | HR 63 | Temp 98.4°F | Ht 74.0 in | Wt 271.0 lb

## 2018-01-02 DIAGNOSIS — R002 Palpitations: Secondary | ICD-10-CM

## 2018-01-02 LAB — LIPID PANEL
CHOLESTEROL: 126 mg/dL (ref 0–200)
HDL: 46.3 mg/dL (ref >39.00)
LDL Cholesterol: 70 mg/dL (ref 0–99)
NONHDL: 79.88
TRIGLYCERIDES: 50 mg/dL (ref 0.0–149.0)
Total CHOL/HDL Ratio: 3
VLDL: 10 mg/dL (ref 0.0–40.0)

## 2018-01-02 LAB — COMPREHENSIVE METABOLIC PANEL
ALK PHOS: 65 U/L (ref 39–117)
ALT: 7 U/L (ref 0–53)
AST: 16 U/L (ref 0–37)
Albumin: 4.1 g/dL (ref 3.5–5.2)
BILIRUBIN TOTAL: 0.8 mg/dL (ref 0.2–1.2)
BUN: 10 mg/dL (ref 6–23)
CALCIUM: 9 mg/dL (ref 8.4–10.5)
CO2: 27 meq/L (ref 19–32)
Chloride: 104 mEq/L (ref 96–112)
Creatinine, Ser: 1.09 mg/dL (ref 0.40–1.50)
GFR: 85.19 mL/min (ref >60.00)
Glucose, Bld: 109 mg/dL — ABNORMAL HIGH (ref 70–99)
POTASSIUM: 4 meq/L (ref 3.5–5.1)
Sodium: 138 mEq/L (ref 135–145)
Total Protein: 6.8 g/dL (ref 6.0–8.3)

## 2018-01-02 LAB — CBC
HEMATOCRIT: 36.7 % — AB (ref 39.0–52.0)
HEMOGLOBIN: 11.7 g/dL — AB (ref 13.0–17.0)
MCHC: 31.8 g/dL (ref 30.0–36.0)
MCV: 63.5 fl — ABNORMAL LOW (ref 78.0–100.0)
PLATELETS: 145 10*3/uL — AB (ref 150.0–400.0)
RBC: 5.77 Mil/uL (ref 4.22–5.81)
RDW: 16.4 % — ABNORMAL HIGH (ref 11.5–15.5)
WBC: 4.3 10*3/uL (ref 4.0–10.5)

## 2018-01-02 LAB — BRAIN NATRIURETIC PEPTIDE: Pro B Natriuretic peptide (BNP): 31 pg/mL (ref 0.0–100.0)

## 2018-01-02 LAB — TSH: TSH: 3.23 u[IU]/mL (ref 0.35–4.50)

## 2018-01-02 LAB — FERRITIN: FERRITIN: 87.3 ng/mL (ref 22.0–322.0)

## 2018-01-02 NOTE — Progress Notes (Signed)
   Subjective:    Patient ID: Shane [name], male    DOB: March 16, 1946, 72 y.o.   MRN: [id number]  HPI The patient is a 72 YO man coming in for palpitations. He has had these for several years. He is not sure if they are getting better or worse. Wants to see a cardiologist. He has been taking his iron consistently and his blood counts have improved. Originally we felt the increase in PVCs could be related although he does not recall this. He denies dark stools or blood in stool. Denies chest pains. Some SOB with activities which worries him. Former smoker. Does has asthma but this has been well controlled recently.   Review of Systems  Constitutional: Negative.   HENT: Negative.   Eyes: Negative.   Respiratory: Positive for shortness of breath. Negative for cough and chest tightness.   Cardiovascular: Positive for palpitations. Negative for chest pain and leg swelling.  Gastrointestinal: Negative for abdominal distention, abdominal pain, constipation, diarrhea, nausea and vomiting.  Musculoskeletal: Negative.   Skin: Negative.   Neurological: Negative.   Psychiatric/Behavioral: Negative.       Objective:   Physical Exam  Constitutional: He is oriented to person, place, and time. He appears well-developed and well-nourished.  HENT:  Head: Normocephalic and atraumatic.  Eyes: EOM are normal.  Neck: Normal range of motion.  Cardiovascular: Normal rate and regular rhythm.  Pulmonary/Chest: Effort normal and breath sounds normal. No respiratory distress. He has no wheezes. He has no rales.  Abdominal: Soft. Bowel sounds are normal. He exhibits no distension. There is no tenderness. There is no rebound.  Musculoskeletal: He exhibits no edema.  Neurological: He is alert and oriented to person, place, and time. Coordination normal.  Skin: Skin is warm and dry.  Psychiatric: He has a normal mood and affect.   Vitals:   01/02/18 0859  BP: 136/82  Pulse: 63  Temp: 98.4 F (36.9 C)    TempSrc: Oral  SpO2: 95%  Weight: 271 lb (122.9 kg)  Height: 6\' 2"  (1.88 m)   EKG: Rate 58, sinus brady, PR slightly long, axis normal, other intervals normal, no st or t wave changes, when compared to 2017 PVCs previously noticed are not present    Assessment & Plan:

## 2018-01-02 NOTE — Patient Instructions (Signed)
We are taking the blood work today and will call you back about the results.   We will get you in with the cardiologist.   The EKG today looks better than the last one with less extra beats.

## 2018-01-02 NOTE — Assessment & Plan Note (Signed)
EKG done today without PVCs, slightly slow rate. He does have normal HR on auscultation. Refer to cardiology. Checking labs including thyroid, CBC, ferritin, BNP, CMP to rule out other causes of palpitations. May need stress testing given his SOB with activity which could be anginal equivalent.

## 2018-01-06 ENCOUNTER — Ambulatory Visit: Payer: PPO | Admitting: Allergy and Immunology

## 2018-01-20 ENCOUNTER — Other Ambulatory Visit: Payer: Self-pay | Admitting: Allergy and Immunology

## 2018-01-27 ENCOUNTER — Ambulatory Visit (INDEPENDENT_AMBULATORY_CARE_PROVIDER_SITE_OTHER): Payer: PPO | Admitting: Allergy and Immunology

## 2018-01-27 ENCOUNTER — Encounter: Payer: Self-pay | Admitting: Allergy and Immunology

## 2018-01-27 VITALS — BP 140/90 | HR 68 | Resp 18

## 2018-01-27 DIAGNOSIS — J4530 Mild persistent asthma, uncomplicated: Secondary | ICD-10-CM | POA: Diagnosis not present

## 2018-01-27 DIAGNOSIS — J0140 Acute pansinusitis, unspecified: Secondary | ICD-10-CM

## 2018-01-27 DIAGNOSIS — G4733 Obstructive sleep apnea (adult) (pediatric): Secondary | ICD-10-CM

## 2018-01-27 DIAGNOSIS — J3089 Other allergic rhinitis: Secondary | ICD-10-CM | POA: Diagnosis not present

## 2018-01-27 DIAGNOSIS — K219 Gastro-esophageal reflux disease without esophagitis: Secondary | ICD-10-CM | POA: Diagnosis not present

## 2018-01-27 MED ORDER — FAMOTIDINE 20 MG PO TABS: 20.0000 mg | ORAL_TABLET | Freq: Two times a day (BID) | ORAL | 5 refills | Status: DC

## 2018-01-27 MED ORDER — ESOMEPRAZOLE MAGNESIUM 40 MG PO CPDR: 40.0000 mg | DELAYED_RELEASE_CAPSULE | Freq: Every day | ORAL | 5 refills | Status: DC

## 2018-01-27 MED ORDER — AMOXICILLIN-POT CLAVULANATE 875-125 MG PO TABS: 1.0000 | ORAL_TABLET | Freq: Two times a day (BID) | ORAL | 0 refills | Status: AC

## 2018-01-27 NOTE — Patient Instructions (Addendum)
  1. Treat and prevent inflammation:   A. OTC Nasacort one spray each nostril 3-7 times a week  B. montelukast 10 mg one tablet once a day  C. Breo 200 one inhalation 1 time per day.    2. Treat and prevent reflux:   A. esomeprazole 40mg  one tablet in AM  B. Start famotidine 20 mg in PM  3. If needed:   A. nasal saline  B. OTC antihistamine  - Allegra    C. Proventil HFA 2 puffs every 4-6 hours  D. OTC Mucinex DM 2 tablets twice a day  4. Continue CPAP   5. For this episode:   A. Augmentin 875 one tablet two times per day for 10 days  B. Prednisone 10mg  one time per day for 10 days  6. Return to clinic in December 2019 or earlier if problem  7. Obtain fall flu vaccine

## 2018-01-27 NOTE — Progress Notes (Signed)
Follow-up Note  Referring Provider: [name], * Primary Provider: [name], MD Date of Office Visit: 01/27/2018  Subjective:   Shane [name] (DOB: 14-Apr-1946) is a 72 y.o. male who returns to the [facility] [place] on 01/27/2018 in re-evaluation of the following:  HPI: Shane [name] returns to this clinic in reevaluation of nonallergic rhinitis and LPR and sleep apnea.  I last saw him in this clinic on 07 October 2017.  He was doing relatively well with his airway and did not have much coughing or nasal congestion and rarely used a short acting bronchodilator but approximately 1 week ago he developed thick congestion out of his nose and in his throat and has been having coughing spells.  As well, his reflux has been more active and he needs to add Tums to his proton pump inhibitor.  He is using the CPAP machine and he definitely sleeps a lot better while using this machine.  Allergies as of 01/27/2018   No Known Allergies     Medication List      albuterol 108 (90 Base) MCG/ACT inhaler Commonly known as:  PROVENTIL HFA;VENTOLIN HFA Inhale 2 puffs every 4 hours as needed for cough or wheeze   CVS VITAMIN B12 1000 MCG tablet Generic drug:  cyanocobalamin TAKE 1 TABLET (1,000 MCG TOTAL) BY MOUTH DAILY.   fluticasone furoate-vilanterol 200-25 MCG/INH Aepb Commonly known as:  BREO ELLIPTA Inhale 1 puff into the lungs daily.   ketoconazole 2 % shampoo Commonly known as:  NIZORAL Apply 1 application topically 2 (two) times a week.   montelukast 10 MG tablet Commonly known as:  SINGULAIR TAKE 1 TABLET BY MOUTH EVERYDAY AT BEDTIME   SYSTANE BALANCE OP Apply 1 drop to eye daily as needed (dry eyes). Reported on 12/19/2015   triamcinolone 55 MCG/ACT Aero nasal inhaler Commonly known as:  NASACORT ALLERGY 24HR Use one spray in each nostril once daily as directed.       Past Medical History:  Diagnosis Date  . Acute asthmatic bronchitis     . Anemia   . Anxiety disorder   . Borderline diabetes mellitus   . Borderline hypertension   . Chest wall pain   . Degenerative joint disease   . Depression   . GERD (gastroesophageal reflux disease)   . History of adenomatous polyp of colon   . History of headache   . History of sinusitis   . Posttraumatic stress disorder   . Sleep apnea     Past Surgical History:  Procedure Laterality Date  . COLONOSCOPY    . FINGER SURGERY Right    index  . NASAL SINUS SURGERY     x 2  . POLYPECTOMY      Review of systems negative except as noted in HPI / PMHx or noted below:  Review of Systems  Constitutional: Negative.   HENT: Negative.   Eyes: Negative.   Respiratory: Negative.   Cardiovascular: Negative.   Gastrointestinal: Negative.   Genitourinary: Negative.   Musculoskeletal: Negative.   Skin: Negative.   Neurological: Negative.   Endo/Heme/Allergies: Negative.   Psychiatric/Behavioral: Negative.      Objective:   Vitals:   01/27/18 1704  BP: 140/90  Pulse: 68  Resp: 18  SpO2: 96%          Physical Exam  HENT:  Head: Normocephalic.  Right Ear: Tympanic membrane, external ear and ear canal normal.  Left Ear: Tympanic membrane, external ear and ear  canal normal.  Nose: Nose normal. No mucosal edema or rhinorrhea.  Mouth/Throat: Uvula is midline, oropharynx is clear and moist and mucous membranes are normal. No oropharyngeal exudate.  Eyes: Conjunctivae are normal.  Neck: Trachea normal. No tracheal tenderness present. No tracheal deviation present. No thyromegaly present.  Cardiovascular: Normal rate, regular rhythm, S1 normal, S2 normal and normal heart sounds.  No murmur heard. Pulmonary/Chest: Breath sounds normal. No stridor. No respiratory distress. He has no wheezes. He has no rales.  Musculoskeletal: He exhibits no edema.  Lymphadenopathy:       Head (right side): No tonsillar adenopathy present.       Head (left side): No tonsillar adenopathy  present.    He has no cervical adenopathy.  Neurological: He is alert.  Skin: No rash noted. He is not diaphoretic. No erythema. Nails show no clubbing.    Diagnostics:    Spirometry was performed and demonstrated an FEV1 of 3.0 at 90 % of predicted.  The patient had an Asthma Control Test with the following results: ACT Total Score: 18.    Assessment and Plan:   1. Asthma, well controlled, mild persistent   2. Other allergic rhinitis   3. LPRD (laryngopharyngeal reflux disease)   4. Sleep apnea, obstructive   5. Acute pansinusitis, recurrence not specified     1. Treat and prevent inflammation:   A. OTC Nasacort one spray each nostril 3-7 times a week  B. montelukast 10 mg one tablet once a day  C. Breo 200 one inhalation 1 time per day.    2. Treat and prevent reflux:   A. esomeprazole 40mg  one tablet in AM  B. Start famotidine 20 mg in PM  3. If needed:   A. nasal saline  B. OTC antihistamine  - Allegra    C. Proventil HFA 2 puffs every 4-6 hours  D. OTC Mucinex DM 2 tablets twice a day  4. Continue CPAP   5. For this episode:   A. Augmentin 875 one tablet two times per day for 10 days  B. Prednisone 10mg  one time per day for 10 days  6. Return to clinic in December 2019 or earlier if problem  7. Obtain fall flu vaccine  Shane [name] appears to have had the development of a sinus infection and I will treat him with the therapy noted above while he continues to use a collection of anti-inflammatory agents for his airway on a regular basis.  As well, his reflux seems to be a little bit more active and he can try adding famotidine at nighttime to his proton pump inhibitor.  Finally he is using his CPAP machine on a pretty regular basis and hopefully this will have a long-term impact on his life as he moves forward.  I will see him back in this clinic in December 2019 or earlier if there is a problem.  Allena Katz, MD Allergy / Immunology [place]

## 2018-01-28 ENCOUNTER — Encounter: Payer: Self-pay | Admitting: Allergy and Immunology

## 2018-02-10 NOTE — Progress Notes (Signed)
Cardiology Office Note   Date:  02/11/2018   ID:  Shane [name], DOB 07/18/1945, MRN [id number]  PCP:  [name], MD  Cardiologist:   No primary care provider on file. Referring:  [name], MD  Chief Complaint  Patient presents with  . Palpitations      History of Present Illness: Shane [name] is a 72 y.o. male who is referred by [name], MD for evaluation of palpitations.  The patient reports that he was told he had a heart attack years ago.  He was seen at the [place] and he said he had some heart damage but he does not report having a catheterization.  I do not have any echocardiograms.He did have a POET (Plain Old Exercise Treadmill) in 2016.  There was no ischemia on this but it was submax as he had SOB and could not walk the treadmill.  I reviewed this result for this appt.    He presents now because he notices some skipped heartbeats.  It is hard for him to quantify or qualify this.  He notices it if he is out doing activity such as pushing a lawnmower.  He might feel his heart skipping.  He denies any chest pressure, neck or arm discomfort.  However, he will intermittently get SOB.  However, he does not report that this is consistent.  The patient denies any PND or orthopnea. He does get some light headedness with his palpitations.     Past Medical History:  Diagnosis Date  . Acute asthmatic bronchitis   . Anemia   . Anxiety disorder   . Borderline diabetes mellitus   . Borderline hypertension   . Chest wall pain   . Degenerative joint disease   . Depression   . GERD (gastroesophageal reflux disease)   . History of adenomatous polyp of colon   . History of headache   . History of sinusitis   . Posttraumatic stress disorder   . Sleep apnea    Uses CPAP    Past Surgical History:  Procedure Laterality Date  . COLONOSCOPY    . FINGER SURGERY Right    index  . NASAL SINUS SURGERY     x 2  . POLYPECTOMY        Current Outpatient Medications  Medication Sig Dispense Refill  . albuterol (PROVENTIL HFA;VENTOLIN HFA) 108 (90 Base) MCG/ACT inhaler Inhale 2 puffs every 4 hours as needed for cough or wheeze 8.5 g 1  . esomeprazole (NEXIUM) 40 MG capsule Take 1 capsule (40 mg total) by mouth daily at 12 noon. 30 capsule 5  . famotidine (PEPCID) 20 MG tablet Take 1 tablet (20 mg total) by mouth 2 (two) times daily. 30 tablet 5  . fluticasone furoate-vilanterol (BREO ELLIPTA) 200-25 MCG/INH AEPB Inhale 1 puff into the lungs daily. 30 each 5  . ketoconazole (NIZORAL) 2 % shampoo Apply 1 application topically 2 (two) times a week.     . montelukast (SINGULAIR) 10 MG tablet TAKE 1 TABLET BY MOUTH EVERYDAY AT BEDTIME 90 tablet 0  . Propylene Glycol (SYSTANE BALANCE OP) Apply 1 drop to eye daily as needed (dry eyes). Reported on 12/19/2015    . triamcinolone (NASACORT ALLERGY 24HR) 55 MCG/ACT AERO nasal inhaler Use one spray in each nostril once daily as directed. 1 Inhaler 5  . CVS VITAMIN B12 1000 MCG tablet TAKE 1 TABLET (1,000 MCG TOTAL) BY MOUTH DAILY. (Patient not taking: Reported on 02/11/2018) 90  tablet 1   No current facility-administered medications for this visit.     Allergies:   Patient has no known allergies.    Social History:  The patient  reports that he quit smoking about 34 years ago. His smoking use included cigarettes. He has a 12.50 pack-year smoking history. He has never used smokeless tobacco. He reports that he does not drink alcohol or use drugs.   Family History:  The patient's family history includes Arthritis in his mother; Diabetes in his father.    ROS:  Please see the history of present illness.   Otherwise, review of systems are positive for none.   All other systems are reviewed and negative.    PHYSICAL EXAM: VS:  BP 132/78   Pulse 62   Ht 6\' 2"  (1.88 m)   Wt 270 lb 6.4 oz (122.7 kg)   BMI 34.72 kg/m  , BMI Body mass index is 34.72 kg/m. GENERAL:  Well  appearing HEENT:  Pupils equal round and reactive, fundi not visualized, oral mucosa unremarkable NECK:  No jugular venous distention, waveform within normal limits, carotid upstroke brisk and symmetric, no bruits, no thyromegaly LYMPHATICS:  No cervical, inguinal adenopathy LUNGS:  Clear to auscultation bilaterally BACK:  No CVA tenderness CHEST:  Unremarkable HEART:  PMI not displaced or sustained,S1 and S2 within normal limits, no S3, no S4, no clicks, no rubs, no murmurs ABD:  Flat, positive bowel sounds normal in frequency in pitch, no bruits, no rebound, no guarding, no midline pulsatile mass, no hepatomegaly, no splenomegaly EXT:  2 plus pulses throughout, non pitting edema right greater than left leg, no cyanosis no clubbing SKIN:  No rashes no nodules NEURO:  Cranial nerves II through XII grossly intact, motor grossly intact throughout PSYCH:  Cognitively intact, oriented to person place and time    EKG:  EKG is not ordered today. The ekg ordered 01/02/18 demonstrates NSR, early transition lead V2. No acute ST T wave changes   Recent Labs: 01/02/2018: ALT 7; BUN 10; Creatinine, Ser 1.09; Hemoglobin 11.7; Platelets 145.0; Potassium 4.0; Pro B Natriuretic peptide (BNP) 31.0; Sodium 138; TSH 3.23    Lipid Panel    Component Value Date/Time   CHOL 126 01/02/2018 0954   TRIG 50.0 01/02/2018 0954   HDL 46.30 01/02/2018 0954   CHOLHDL 3 01/02/2018 0954   VLDL 10.0 01/02/2018 0954   LDLCALC 70 01/02/2018 0954      Wt Readings from Last 3 Encounters:  02/11/18 270 lb 6.4 oz (122.7 kg)  01/02/18 271 lb (122.9 kg)  07/03/17 275 lb (124.7 kg)      Other studies Reviewed: Additional studies/ records that were reviewed today include: POET (Plain Old Exercise Treadmill). Review of the above records demonstrates:  Please see elsewhere in the note.     ASSESSMENT AND PLAN:  PALPITATIONS:    I suspect PACs or PVCs.  He will need a monitor.   De Burrs will need a 21 day  event monitor.  The patients symptoms necessitate an event monitor.  The symptoms are too infrequent to be identified on a Holter monitor.    LEG ULCER:  He refused an examination of this today. He reports that he had some trauma to his leg and that it had some smell to it and pus but he was treating it himself and that it was getting better.  He refused to let me unwrap it.  I told him I would be happy to look at  it and that he should let some health professional exam it.  Again, he did not want this to be done.  POSSIBLE PREVIOUS MI:  I suggested an echo but he would prefer to get the [place] records.  He has no chest pain and no ongoing symptoms.  I will be happy to review these records.    Current medicines are reviewed at length with the patient today.  The patient does not have concerns regarding medicines.  The following changes have been made:  no change  Labs/ tests ordered today include:   Orders Placed This Encounter  Procedures  . CARDIAC EVENT MONITOR     Disposition:   FU with me in one month after the monitor.      Signed, Minus Breeding, MD  02/11/2018 5:36 PM    [facility]

## 2018-02-11 ENCOUNTER — Encounter: Payer: Self-pay | Admitting: Cardiology

## 2018-02-11 ENCOUNTER — Ambulatory Visit: Payer: PPO | Admitting: Cardiology

## 2018-02-11 VITALS — BP 132/78 | HR 62 | Ht 74.0 in | Wt 270.4 lb

## 2018-02-11 DIAGNOSIS — R002 Palpitations: Secondary | ICD-10-CM | POA: Diagnosis not present

## 2018-02-11 NOTE — Patient Instructions (Signed)
Medication Instructions:  Continue current medications  If you need a refill on your cardiac medications before your next appointment, please call your pharmacy.  Labwork: None Ordered   Testing/Procedures: Your physician has recommended that you wear an event monitor. Event monitors are medical devices that record the heart's electrical activity. Doctors most often [place] these monitors to diagnose arrhythmias. Arrhythmias are problems with the speed or rhythm of the heartbeat. The monitor is a small, portable device. You can wear one while you do your normal daily activities. This is usually used to diagnose what is causing palpitations/syncope (passing out).   Follow-Up: Your physician wants you to follow-up in: 6 Weeks.     Thank you for choosing [facility] at [facility]!!

## 2018-02-12 ENCOUNTER — Ambulatory Visit: Payer: PPO | Admitting: Adult Health

## 2018-02-17 ENCOUNTER — Ambulatory Visit (INDEPENDENT_AMBULATORY_CARE_PROVIDER_SITE_OTHER): Payer: PPO

## 2018-02-17 DIAGNOSIS — R002 Palpitations: Secondary | ICD-10-CM | POA: Diagnosis not present

## 2018-02-19 ENCOUNTER — Telehealth: Payer: Self-pay | Admitting: Allergy and Immunology

## 2018-02-19 NOTE — Telephone Encounter (Signed)
Patient was previously seen by [facility] ( Dr. Havery Moros) in 2018. Can you see if we can get him in there?

## 2018-02-19 NOTE — Telephone Encounter (Signed)
Dr. Neldon Mc will you please advise on alternative? Thank you.

## 2018-02-19 NOTE — Telephone Encounter (Signed)
Pt called and said that you put him on Famotidine is not working and needs something else. [facility] [phone].

## 2018-02-19 NOTE — Telephone Encounter (Signed)
Lets get him set up to see a GI doctor. Does he have a GI doctor already?

## 2018-02-20 NOTE — Telephone Encounter (Signed)
gerd 

## 2018-02-20 NOTE — Telephone Encounter (Signed)
What is the referring diagnosis?

## 2018-02-24 NOTE — Telephone Encounter (Signed)
Referral placed in the Q

## 2018-03-04 ENCOUNTER — Other Ambulatory Visit: Payer: Self-pay | Admitting: Allergy and Immunology

## 2018-03-30 ENCOUNTER — Encounter: Payer: Self-pay | Admitting: Cardiology

## 2018-03-30 NOTE — Progress Notes (Deleted)
Cardiology Office Note   Date:  03/30/2018   ID:  Shane [name], DOB 27-Feb-1946, MRN [id number]  PCP:  [name], MD  Cardiologist:   No primary care provider on file. Referring:  [name], MD  No chief complaint on file.     History of Present Illness: Shane [name] is a 72 y.o. male who is referred by [name], MD for evaluation of palpitations.  The patient reports that he was told he had a heart attack years ago.  He was seen at the [place] and he said he had some heart damage but he does not report having a catheterization.  He did have a POET (Plain Old Exercise Treadmill) in 2016.  There was no ischemia on this but it was submax as he had SOB and could not walk the treadmill.  At my first appt with him he was noticing skipped beats.    I ordered a Holter.  He had PVCs and PACs.  There was a question of short runs of CHB.  ***      I reviewed this result for this appt.    He presents now because he notices some skipped heartbeats.  It is hard for him to quantify or qualify this.  He notices it if he is out doing activity such as pushing a lawnmower.  He might feel his heart skipping.  He denies any chest pressure, neck or arm discomfort.  However, he will intermittently get SOB.  However, he does not report that this is consistent.  The patient denies any PND or orthopnea. He does get some light headedness with his palpitations.     Past Medical History:  Diagnosis Date  . Acute asthmatic bronchitis   . Anemia   . Anxiety disorder   . Borderline diabetes mellitus   . Borderline hypertension   . Degenerative joint disease   . Depression   . GERD (gastroesophageal reflux disease)   . History of adenomatous polyp of colon   . History of sinusitis   . Posttraumatic stress disorder   . Sleep apnea    Uses CPAP    Past Surgical History:  Procedure Laterality Date  . COLONOSCOPY    . FINGER SURGERY Right    index  . NASAL  SINUS SURGERY     x 2  . POLYPECTOMY       Current Outpatient Medications  Medication Sig Dispense Refill  . albuterol (PROVENTIL HFA;VENTOLIN HFA) 108 (90 Base) MCG/ACT inhaler Inhale 2 puffs every 4 hours as needed for cough or wheeze 8.5 g 1  . CVS VITAMIN B12 1000 MCG tablet TAKE 1 TABLET (1,000 MCG TOTAL) BY MOUTH DAILY. (Patient not taking: Reported on 02/11/2018) 90 tablet 1  . esomeprazole (NEXIUM) 40 MG capsule Take 1 capsule (40 mg total) by mouth daily at 12 noon. 30 capsule 5  . famotidine (PEPCID) 20 MG tablet Take 1 tablet (20 mg total) by mouth 2 (two) times daily. 30 tablet 5  . fluticasone furoate-vilanterol (BREO ELLIPTA) 200-25 MCG/INH AEPB Inhale 1 puff into the lungs daily. 30 each 5  . ketoconazole (NIZORAL) 2 % shampoo Apply 1 application topically 2 (two) times a week.     . montelukast (SINGULAIR) 10 MG tablet TAKE 1 TABLET BY MOUTH EVERYDAY AT BEDTIME 90 tablet 0  . Propylene Glycol (SYSTANE BALANCE OP) Apply 1 drop to eye daily as needed (dry eyes). Reported on 12/19/2015    . triamcinolone (  NASACORT ALLERGY 24HR) 55 MCG/ACT AERO nasal inhaler Use one spray in each nostril once daily as directed. 1 Inhaler 5   No current facility-administered medications for this visit.     Allergies:   Patient has no known allergies.    ROS:  Please see the history of present illness.   Otherwise, review of systems are positive for ***.   All other systems are reviewed and negative.    PHYSICAL EXAM: VS:  There were no vitals taken for this visit. , BMI There is no height or weight on file to calculate BMI. GENERAL:  Well appearing NECK:  No jugular venous distention, waveform within normal limits, carotid upstroke brisk and symmetric, no bruits, no thyromegaly LUNGS:  Clear to auscultation bilaterally CHEST:  Unremarkable HEART:  PMI not displaced or sustained,S1 and S2 within normal limits, no S3, no S4, no clicks, no rubs, *** murmurs ABD:  Flat, positive bowel sounds  normal in frequency in pitch, no bruits, no rebound, no guarding, no midline pulsatile mass, no hepatomegaly, no splenomegaly EXT:  2 plus pulses throughout, no edema, no cyanosis no clubbing    ***GENERAL:  Well appearing HEENT:  Pupils equal round and reactive, fundi not visualized, oral mucosa unremarkable NECK:  No jugular venous distention, waveform within normal limits, carotid upstroke brisk and symmetric, no bruits, no thyromegaly LYMPHATICS:  No cervical, inguinal adenopathy LUNGS:  Clear to auscultation bilaterally BACK:  No CVA tenderness CHEST:  Unremarkable HEART:  PMI not displaced or sustained,S1 and S2 within normal limits, no S3, no S4, no clicks, no rubs, no murmurs ABD:  Flat, positive bowel sounds normal in frequency in pitch, no bruits, no rebound, no guarding, no midline pulsatile mass, no hepatomegaly, no splenomegaly EXT:  2 plus pulses throughout, non pitting edema right greater than left leg, no cyanosis no clubbing SKIN:  No rashes no nodules NEURO:  Cranial nerves II through XII grossly intact, motor grossly intact throughout PSYCH:  Cognitively intact, oriented to person place and time    EKG:  EKG is not *** ordered today. ***   Recent Labs: 01/02/2018: ALT 7; BUN 10; Creatinine, Ser 1.09; Hemoglobin 11.7; Platelets 145.0; Potassium 4.0; Pro B Natriuretic peptide (BNP) 31.0; Sodium 138; TSH 3.23    Lipid Panel    Component Value Date/Time   CHOL 126 01/02/2018 0954   TRIG 50.0 01/02/2018 0954   HDL 46.30 01/02/2018 0954   CHOLHDL 3 01/02/2018 0954   VLDL 10.0 01/02/2018 0954   LDLCALC 70 01/02/2018 0954      Wt Readings from Last 3 Encounters:  02/11/18 270 lb 6.4 oz (122.7 kg)  01/02/18 271 lb (122.9 kg)  07/03/17 275 lb (124.7 kg)      Other studies Reviewed: Additional studies/ records that were reviewed today include: *** Review of the above records demonstrates:  ***   ASSESSMENT AND PLAN:  PALPITATIONS:   ***    I suspect PACs  or PVCs.  He will need a monitor.   De Burrs will need a 21 day event monitor.  The patients symptoms necessitate an event monitor.  The symptoms are too infrequent to be identified on a Holter monitor.    LEG ULCER:  ***   He refused an examination of this today. He reports that he had some trauma to his leg and that it had some smell to it and pus but he was treating it himself and that it was getting better.  He refused to let  me unwrap it.  I told him I would be happy to look at it and that he should let some health professional exam it.  Again, he did not want this to be done.  POSSIBLE PREVIOUS MI:   I am unable to get the echo from the [place].  ***   I suggested an echo but he would prefer to get the [place] records.  He has no chest pain and no ongoing symptoms.  I will be happy to review these records.    Current medicines are reviewed at length with the patient today.  The patient does not have concerns regarding medicines.  The following changes have been made:  ***  Labs/ tests ordered today include: ***  No orders of the defined types were placed in this encounter.    Disposition:   FU with me ***     Signed, Minus Breeding, MD  03/30/2018 8:55 PM    [place]

## 2018-03-31 ENCOUNTER — Ambulatory Visit: Payer: PPO | Admitting: Cardiology

## 2018-04-01 ENCOUNTER — Encounter: Payer: Self-pay | Admitting: *Deleted

## 2018-04-02 ENCOUNTER — Other Ambulatory Visit: Payer: Self-pay | Admitting: Allergy and Immunology

## 2018-04-17 ENCOUNTER — Encounter: Payer: Self-pay | Admitting: Gastroenterology

## 2018-04-17 ENCOUNTER — Encounter

## 2018-04-17 ENCOUNTER — Ambulatory Visit: Payer: PPO | Admitting: Gastroenterology

## 2018-04-17 VITALS — BP 142/82 | HR 74 | Ht 74.0 in | Wt 263.0 lb

## 2018-04-17 DIAGNOSIS — R1011 Right upper quadrant pain: Secondary | ICD-10-CM | POA: Diagnosis not present

## 2018-04-17 DIAGNOSIS — R1310 Dysphagia, unspecified: Secondary | ICD-10-CM | POA: Diagnosis not present

## 2018-04-17 DIAGNOSIS — K219 Gastro-esophageal reflux disease without esophagitis: Secondary | ICD-10-CM

## 2018-04-17 NOTE — Progress Notes (Signed)
HPI :  72 year old male here for follow-up visit. I previously saw him about a year ago for a colonoscopy.  Today he is here to discuss issues with reflux and dysphagia. He reports while he's been having some occasional more typical reflux symptoms such as pyrosis, he is been having some sore throat has been bothering him. He also endorses some dysphagia which has been bothering him on and off for the past few months. He states it sometimes takes a long time for some solids to travel down his esophagus, drinks some fluids to help push it through. He also endorses this "burning sensation"he feels in his right upper abdomen to flank. States this bothers him at night at times as well as the morning when he wakes up. He doesn't think it bothers him as much when he eats, sometimes she thinks eating actually makes him feel better. He's been on Nexium 40 mg once daily. He has been taking some Rolaids and Mylanta for these symptoms and he reports that that has been helping him feel better. He questions whether he can increase his Nexium to twice daily. He denies any weight loss, no nausea or vomiting. Otherwise eating well.   Endoscopic history EGD 09/23/12 - small hiatal hernia, otherwise normal Colonoscopy 09/23/12 - sigmoid adenoma x 1, otherwise normal Colonoscopy 04/02/2017 - normal ileum, 5 small polyps, internal hemorrhoids - path c/w adenomas / sessile serrated polyps - repeat surveillance 03/2020.  Past Medical History:  Diagnosis Date  . Acute asthmatic bronchitis   . Anemia   . Anxiety disorder   . Borderline diabetes mellitus   . Borderline hypertension   . Degenerative joint disease   . Depression   . GERD (gastroesophageal reflux disease)   . History of adenomatous polyp of colon   . History of sinusitis   . Posttraumatic stress disorder   . Sleep apnea    Uses CPAP     Past Surgical History:  Procedure Laterality Date  . COLONOSCOPY    . FINGER SURGERY Right    index  .  NASAL SINUS SURGERY     x 2  . POLYPECTOMY     Family History  Problem Relation Age of Onset  . Diabetes Father   . Arthritis Mother        Unknown cause of death  . Colon cancer Neg Hx   . Colon polyps Neg Hx   . Rectal cancer Neg Hx   . Stomach cancer Neg Hx    Social History   Tobacco Use  . Smoking status: Former Smoker    Packs/day: 0.50    Years: 25.00    Pack years: 12.50    Types: Cigarettes    Last attempt to quit: 07/02/1983    Years since quitting: 34.8  . Smokeless tobacco: Never Used  Substance Use Topics  . Alcohol use: No  . Drug use: No   Current Outpatient Medications  Medication Sig Dispense Refill  . [facility] VITAMIN B12 1000 MCG tablet TAKE 1 TABLET (1,000 MCG TOTAL) BY MOUTH DAILY. 90 tablet 1  . esomeprazole (NEXIUM) 40 MG capsule Take 1 capsule (40 mg total) by mouth daily at 12 noon. 30 capsule 5  . fluticasone furoate-vilanterol (BREO ELLIPTA) 200-25 MCG/INH AEPB Inhale 1 puff into the lungs daily. 30 each 5  . ketoconazole (NIZORAL) 2 % shampoo Apply 1 application topically 2 (two) times a week.     . montelukast (SINGULAIR) 10 MG tablet TAKE 1 TABLET  BY MOUTH EVERYDAY AT BEDTIME 90 tablet 0  . PROAIR HFA 108 (90 Base) MCG/ACT inhaler INHALE 2 PUFFS EVERY 4 HOURS AS NEEDED FOR COUGH OR WHEEZE 8.5 Inhaler 1  . Propylene Glycol (SYSTANE BALANCE OP) Apply 1 drop to eye daily as needed (dry eyes). Reported on 12/19/2015    . triamcinolone (NASACORT ALLERGY 24HR) 55 MCG/ACT AERO nasal inhaler Use one spray in each nostril once daily as directed. 1 Inhaler 5   No current facility-administered medications for this visit.    No Known Allergies   Review of Systems: All systems reviewed and negative except where noted in HPI.     Lab Results  Component Value Date   CREATININE 1.09 01/02/2018   BUN 10 01/02/2018   NA 138 01/02/2018   K 4.0 01/02/2018   CL 104 01/02/2018   CO2 27 01/02/2018     Physical Exam: BP (!) 142/82   Pulse 74   Ht 6\' 2"   (1.88 m)   Wt 263 lb (119.3 kg)   BMI 33.77 kg/m  Constitutional: Pleasant,well-developed, male in no acute distress. HEENT: Normocephalic and atraumatic. Conjunctivae are normal. No scleral icterus. Neck supple.  Cardiovascular: Normal rate, regular rhythm.  Pulmonary/chest: Effort normal and breath sounds normal. No wheezing, rales or rhonchi. Abdominal: Soft, nondistended, nontender. . There are no masses palpable. No hepatomegaly. Extremities: no edema Lymphadenopathy: No cervical adenopathy noted. Neurological: Alert and oriented to person place and time. Skin: Skin is warm and dry. No rashes noted. Psychiatric: Normal mood and affect. Behavior is normal.   ASSESSMENT AND PLAN: 72 year old male with history as outlined above, here for reassessment following issues:  GERD / dysphagia / abdominal discomfort - as outlined above, appears to improve with taking over the counter antacids on top of his Nexium 40 mg once daily. Dysphagia is somewhat new, concerning for possible peptic stricture. The burning sensation in his right abdomen would be unusual to be related to dyspepsia, I'm not sure this is related to his GI tract, however he feels antacid sometimes do help this. We will try increasing his Nexium to 40 mg twice a day for a few weeks to see if this helps. I counseled him on the risks and benefits of PPI use and he wanted to proceed, his renal function is normal. Otherwise I offered him an upper endoscopy to further evaluate his esophagus in light of his dysphagia, potentially dilate if appropriate. After discussion of the risks and benefits of endoscopy he wanted to proceed. Further recommendations pending the results and his course.   Cellar, MD [facility]

## 2018-04-17 NOTE — Patient Instructions (Addendum)
If you are age 73 or older, your body mass index should be between 23-30. Your Body mass index is 33.77 kg/m. If this is out of the aforementioned range listed, please consider follow up with your Primary Care Provider.  If you are age 49 or younger, your body mass index should be between 19-25. Your Body mass index is 33.77 kg/m. If this is out of the aformentioned range listed, please consider follow up with your Primary Care Provider.   You have been scheduled for an endoscopy. Please follow written instructions given to you at your visit today. If you use inhalers (even only as needed), please bring them with you on the day of your procedure. Your physician has requested that you go to www.startemmi.com and enter the access code given to you at your visit today. This web site gives a general overview about your procedure. However, you should still follow specific instructions given to you by our office regarding your preparation for the procedure.  Increase the Nexium 40 mg to twice a day.  It was a pleasure to see you today!

## 2018-04-25 ENCOUNTER — Other Ambulatory Visit: Payer: Self-pay | Admitting: Allergy and Immunology

## 2018-04-27 ENCOUNTER — Encounter: Payer: Self-pay | Admitting: Gastroenterology

## 2018-04-29 NOTE — Progress Notes (Signed)
Cardiology Office Note   Date:  04/30/2018   ID:  Shane [name], DOB 1945/11/01, MRN [id number]  PCP:  [name], MD  Cardiologist:   No primary care provider on file. Referring:  [name], MD    Chief Complaint  Patient presents with  . Palpitations      History of Present Illness: Shane [name] is a 72 y.o. male who is referred by [name], MD for evaluation of palpitations.  The patient reports that he was told he had a heart attack years ago.  He was seen at the [place] and he said he had some heart damage but he does not report having a catheterization.  He did have a POET (Plain Old Exercise Treadmill) in 2016.  There was no ischemia on this but it was submax as he had SOB and could not walk the treadmill.  At my first appt with him he was noticing skipped beats.    I ordered a Holter.  He had PVCs and PACs.  There was a question of short runs of CHB.    He continues to have episodes of "faint" spells.  However, these are very infrequent.  He says is been going on for years.  They might happen a couple of times per month.  He does feel his heart skipping.  However, it is extremely difficult for him to quantify or qualify or describe the events.  There is been no frank syncope.  He really is not getting worse over the years.  He did not really have one while he was wearing the monitor.  He denies any chest pressure, neck or arm discomfort.  He has no PND or orthopnea.   Past Medical History:  Diagnosis Date  . Acute asthmatic bronchitis   . Anemia   . Anxiety disorder   . Borderline diabetes mellitus   . Borderline hypertension   . Degenerative joint disease   . Depression   . GERD (gastroesophageal reflux disease)   . History of adenomatous polyp of colon   . History of sinusitis   . Posttraumatic stress disorder   . Sleep apnea    Uses CPAP    Past Surgical History:  Procedure Laterality Date  . COLONOSCOPY    . FINGER  SURGERY Right    index  . NASAL SINUS SURGERY     x 2  . POLYPECTOMY       Current Outpatient Medications  Medication Sig Dispense Refill  . albuterol (PROVENTIL HFA;VENTOLIN HFA) 108 (90 Base) MCG/ACT inhaler INHALE 2 PUFFS EVERY 4 HOURS AS NEEDED FOR COUGH OR WHEEZE 8.5 g 0  . [facility] VITAMIN B12 1000 MCG tablet TAKE 1 TABLET (1,000 MCG TOTAL) BY MOUTH DAILY. 90 tablet 1  . esomeprazole (NEXIUM) 40 MG capsule Take 1 capsule (40 mg total) by mouth daily at 12 noon. 30 capsule 5  . fluticasone furoate-vilanterol (BREO ELLIPTA) 200-25 MCG/INH AEPB Inhale 1 puff into the lungs daily. 30 each 5  . ketoconazole (NIZORAL) 2 % shampoo Apply 1 application topically 2 (two) times a week.     . montelukast (SINGULAIR) 10 MG tablet TAKE 1 TABLET BY MOUTH EVERYDAY AT BEDTIME 90 tablet 0  . PROAIR HFA 108 (90 Base) MCG/ACT inhaler INHALE 2 PUFFS EVERY 4 HOURS AS NEEDED FOR COUGH OR WHEEZE 8.5 Inhaler 1  . Propylene Glycol (SYSTANE BALANCE OP) Apply 1 drop to eye daily as needed (dry eyes). Reported on 12/19/2015    .  triamcinolone (NASACORT ALLERGY 24HR) 55 MCG/ACT AERO nasal inhaler Use one spray in each nostril once daily as directed. 1 Inhaler 5   No current facility-administered medications for this visit.     Allergies:   Patient has no known allergies.    ROS:  Please see the history of present illness.   Otherwise, review of systems are positive for none.   All other systems are reviewed and negative.    PHYSICAL EXAM: VS:  BP (!) 150/80   Pulse 63   Ht 6\' 2"  (1.88 m)   Wt 274 lb (124.3 kg)   SpO2 94%   BMI 35.18 kg/m  , BMI Body mass index is 35.18 kg/m. GENERAL:  Well appearing NECK:  No jugular venous distention, waveform within normal limits, carotid upstroke brisk and symmetric, no bruits, no thyromegaly LUNGS:  Clear to auscultation bilaterally CHEST:  Unremarkable HEART:  PMI not displaced or sustained,S1 and S2 within normal limits, no S3, no S4, no clicks, no rubs, 2 out of 6  apical early peaking systolic murmur, no diastolic murmurs ABD:  Flat, positive bowel sounds normal in frequency in pitch, no bruits, no rebound, no guarding, no midline pulsatile mass, no hepatomegaly, no splenomegaly EXT:  2 plus pulses throughout, trace edema, no cyanosis no clubbing   EKG:  EKG is not  ordered today.    Recent Labs: 01/02/2018: ALT 7; BUN 10; Creatinine, Ser 1.09; Hemoglobin 11.7; Platelets 145.0; Potassium 4.0; Pro B Natriuretic peptide (BNP) 31.0; Sodium 138; TSH 3.23    Lipid Panel    Component Value Date/Time   CHOL 126 01/02/2018 0954   TRIG 50.0 01/02/2018 0954   HDL 46.30 01/02/2018 0954   CHOLHDL 3 01/02/2018 0954   VLDL 10.0 01/02/2018 0954   LDLCALC 70 01/02/2018 0954      Wt Readings from Last 3 Encounters:  04/30/18 274 lb (124.3 kg)  04/17/18 263 lb (119.3 kg)  02/11/18 270 lb 6.4 oz (122.7 kg)      Other studies Reviewed: Additional studies/ records that were reviewed today include: Event monitor. Review of the above records demonstrates:  See above   ASSESSMENT AND PLAN:  PALPITATIONS:    I still suspect he might have some bradycardia arrhythmia but I cannot correlate anything that I saw with any symptoms.  I reviewed the Holter again.  There is some bradycardia but not clear complete heart block or indication for pacemaker.  I will make sure he is chronotropic competence so he is going to have a POET (Plain Old Exercise Treadmill) .  I realize this will likely be submaximal.  He probably should have a modified Bruce protocol.  I am just looking for heart rate response essentially.  POSSIBLE PREVIOUS MI:   I am unable to get the echo from the [place]. because of this and the murmur that I hear is like to get an echocardiogram for our records.  Current medicines are reviewed at length with the patient today.  The patient does not have concerns regarding medicines.  The following changes have been made:  None  Labs/ tests ordered today  include:   Orders Placed This Encounter  Procedures  . EXERCISE TOLERANCE TEST (ETT)  . ECHOCARDIOGRAM COMPLETE     Disposition:   FU with me after the above testing.      Signed, Minus Breeding, MD  04/30/2018 3:09 PM    [facility]

## 2018-04-30 ENCOUNTER — Ambulatory Visit: Payer: PPO | Admitting: Cardiology

## 2018-04-30 ENCOUNTER — Encounter: Payer: Self-pay | Admitting: Cardiology

## 2018-04-30 VITALS — BP 150/80 | HR 63 | Ht 74.0 in | Wt 274.0 lb

## 2018-04-30 DIAGNOSIS — R011 Cardiac murmur, unspecified: Secondary | ICD-10-CM | POA: Diagnosis not present

## 2018-04-30 DIAGNOSIS — R002 Palpitations: Secondary | ICD-10-CM | POA: Diagnosis not present

## 2018-04-30 DIAGNOSIS — R42 Dizziness and giddiness: Secondary | ICD-10-CM

## 2018-04-30 NOTE — Patient Instructions (Signed)
Medication Instructions:  Continue current medications  If you need a refill on your cardiac medications before your next appointment, please call your pharmacy.  Labwork: None Ordered   If you have labs (blood work) drawn today and your tests are completely normal, you will receive your results only by: Marland Kitchen MyChart Message (if you have MyChart) OR . A paper copy in the mail If you have any lab test that is abnormal or we need to change your treatment, we will call you to review the results.  Testing/Procedures: Your physician has requested that you have an exercise tolerance test. For further information please visit [email or web address]. Please also follow instruction sheet, as given.  Your physician has requested that you have an echocardiogram. Echocardiography is a painless test that uses sound waves to create images of your heart. It provides your doctor with information about the size and shape of your heart and how well your heart's chambers and valves are working. This procedure takes approximately one hour. There are no restrictions for this procedure.    Follow-Up: . You will need a follow up appointment in 3 Months.    At [facility], you and your health needs are our priority.  As part of our continuing mission to provide you with exceptional heart care, we have created designated Provider Care Teams.  These Care Teams include your primary Cardiologist (physician) and Advanced Practice Providers (APPs -  Physician Assistants and Nurse Practitioners) who all work together to provide you with the care you need, when you need it.   Thank you for choosing [facility] at [facility]!!

## 2018-05-01 ENCOUNTER — Telehealth (HOSPITAL_COMMUNITY): Payer: Self-pay

## 2018-05-01 NOTE — Telephone Encounter (Signed)
Encounter complete. 

## 2018-05-02 ENCOUNTER — Other Ambulatory Visit: Payer: Self-pay | Admitting: Allergy and Immunology

## 2018-05-05 ENCOUNTER — Ambulatory Visit (HOSPITAL_COMMUNITY)
Admission: RE | Admit: 2018-05-05 | Payer: PPO | Source: Ambulatory Visit | Attending: Cardiology | Admitting: Cardiology

## 2018-05-06 ENCOUNTER — Encounter: Payer: PPO | Admitting: Gastroenterology

## 2018-05-08 ENCOUNTER — Telehealth: Payer: Self-pay | Admitting: *Deleted

## 2018-05-08 ENCOUNTER — Telehealth: Payer: Self-pay | Admitting: Gastroenterology

## 2018-05-08 ENCOUNTER — Encounter: Payer: Self-pay | Admitting: Cardiology

## 2018-05-08 ENCOUNTER — Other Ambulatory Visit (HOSPITAL_COMMUNITY): Payer: PPO

## 2018-05-08 NOTE — Telephone Encounter (Signed)
Patient walked in today wanting his paperwork from his recent visit because he lost it. He needed to know dates of is ETT and Echo. He had actually already missed those 2 appointments stating his phone was not working at home. Took him to check out to get rescheduled. Per Zigmund Daniel she rescheduled and printed calenders for patient.

## 2018-05-08 NOTE — Telephone Encounter (Signed)
Pt was scheduled for a endo and came into the office on 05/08/18 and appologized that he did not call the office to cancel.  He woke up sick on Wedneday and said he wanted to "make amends" and he did not won't this held against him.  He will reschedule once he is feeling better

## 2018-05-11 DIAGNOSIS — R1312 Dysphagia, oropharyngeal phase: Secondary | ICD-10-CM | POA: Diagnosis not present

## 2018-05-11 DIAGNOSIS — J310 Chronic rhinitis: Secondary | ICD-10-CM | POA: Diagnosis not present

## 2018-05-11 DIAGNOSIS — K219 Gastro-esophageal reflux disease without esophagitis: Secondary | ICD-10-CM | POA: Diagnosis not present

## 2018-05-11 DIAGNOSIS — J329 Chronic sinusitis, unspecified: Secondary | ICD-10-CM | POA: Diagnosis not present

## 2018-05-11 DIAGNOSIS — H6123 Impacted cerumen, bilateral: Secondary | ICD-10-CM | POA: Diagnosis not present

## 2018-05-12 ENCOUNTER — Telehealth (HOSPITAL_COMMUNITY): Payer: Self-pay

## 2018-05-12 NOTE — Telephone Encounter (Signed)
Encounter complete. 

## 2018-05-13 ENCOUNTER — Ambulatory Visit (HOSPITAL_COMMUNITY)
Admission: RE | Admit: 2018-05-13 | Payer: PPO | Source: Ambulatory Visit | Attending: Cardiology | Admitting: Cardiology

## 2018-05-15 ENCOUNTER — Other Ambulatory Visit (HOSPITAL_COMMUNITY): Payer: PPO

## 2018-05-25 ENCOUNTER — Encounter (HOSPITAL_COMMUNITY): Payer: Self-pay | Admitting: Cardiology

## 2018-05-26 ENCOUNTER — Other Ambulatory Visit (HOSPITAL_COMMUNITY): Payer: PPO

## 2018-06-01 ENCOUNTER — Encounter (HOSPITAL_COMMUNITY): Payer: Self-pay | Admitting: Cardiology

## 2018-06-01 ENCOUNTER — Other Ambulatory Visit: Payer: Self-pay | Admitting: Allergy and Immunology

## 2018-06-03 ENCOUNTER — Telehealth: Payer: Self-pay

## 2018-06-03 NOTE — Telephone Encounter (Signed)
New message     Just an FYI. We have made several attempts to contact this patient including sending a letter to schedule or reschedule their echocardiogram & EXERCISE TOLERANCE TEST. We will be removing the patient from the echo WQ.   Thank you

## 2018-06-05 ENCOUNTER — Telehealth: Payer: Self-pay

## 2018-06-05 ENCOUNTER — Telehealth: Payer: Self-pay | Admitting: *Deleted

## 2018-06-05 MED ORDER — PREDNISONE 20 MG PO TABS: ORAL_TABLET | ORAL | 0 refills | Status: DC

## 2018-06-05 NOTE — Telephone Encounter (Signed)
New message    Just an FYI. We have made several attempts to contact this patient including sending a letter to schedule or reschedule their EXERCISE TOLERANCE TEST. We will be removing the patient from the WQ.   Thank you

## 2018-06-05 NOTE — Telephone Encounter (Signed)
Patient requesting [facility] to be [facility]  Please advise [phone]  Patient had an appt on Tuesday 12/10, patient cancelled appointment and does not want to reschedule right now.

## 2018-06-05 NOTE — Telephone Encounter (Signed)
Call and leave message for pt to call back  

## 2018-06-05 NOTE — Telephone Encounter (Signed)
Spoke with patient. Patient states he has been having a productive cough with green nasal drainage. This has been going on for 5 days. Patient states he went to a walk in clinic approx. 1 week ago and was prescribed a cough medication but he is unsure of the name. He is currently taking the cough medication with his nasal spray and allergy meds. Per Dr. Nelva Bush, she believes this could be viral in nature due to the duration of symptoms and does not feel like he warrents antibiotics at this time and would like to start with Prednisone 20mg  for 5 days. Patient verbalized understanding and patient made aware that if symptoms worsen or do not get better that he needs to come in to be seen or go to urgent care. Patient was offered to schedule apt to be seen and he declined.

## 2018-06-05 NOTE — Telephone Encounter (Signed)
This is a duplication message. This was sent on 06/03/18

## 2018-06-09 ENCOUNTER — Ambulatory Visit: Payer: PPO | Admitting: Allergy and Immunology

## 2018-06-09 ENCOUNTER — Other Ambulatory Visit: Payer: Self-pay | Admitting: Allergy and Immunology

## 2018-06-11 DIAGNOSIS — J329 Chronic sinusitis, unspecified: Secondary | ICD-10-CM | POA: Diagnosis not present

## 2018-06-11 DIAGNOSIS — K219 Gastro-esophageal reflux disease without esophagitis: Secondary | ICD-10-CM | POA: Diagnosis not present

## 2018-06-11 DIAGNOSIS — J310 Chronic rhinitis: Secondary | ICD-10-CM | POA: Diagnosis not present

## 2018-07-11 ENCOUNTER — Other Ambulatory Visit: Payer: Self-pay | Admitting: Allergy and Immunology

## 2018-07-20 ENCOUNTER — Ambulatory Visit: Payer: PPO | Admitting: Allergy and Immunology

## 2018-07-20 DIAGNOSIS — J309 Allergic rhinitis, unspecified: Secondary | ICD-10-CM

## 2018-07-29 NOTE — Progress Notes (Signed)
Cardiology Office Note   Date:  07/30/2018   ID:  Shane [name], DOB 13-Aug-1945, MRN [id number]  PCP:  [name], MD  Cardiologist:   No primary care provider on file. Referring:  [name], MD    Chief Complaint  Patient presents with  . Dizziness      History of Present Illness: Shane [name] is a 73 y.o. male who is referred by [name], MD for evaluation of palpitations.  The patient reports that he was told he had a heart attack years ago.  He was seen at the [place] and he said he had some heart damage but he does not report having a catheterization.  He did have a POET (Plain Old Exercise Treadmill) in 2016.  There was no ischemia on this but it was submax as he had SOB and could not walk the treadmill.  At my first appt with him he was noticing skipped beats.    I ordered a Holter.  He had PVCs and PACs.  There was a question of short runs of CHB.  He saw me recently and was to have followup testing with an echo.  However, he failed to present for this.  I do note that he walked into the office afterwards and reschedule his appointments but he failed to make these as well.  We have been unable to contact him by phone.  It turns out he can receive messages but he says he cannot call out.  He has been fighting bronchitis.  He now has gout.  He did not do the treadmill because of these conditions.  He still has a bit of a cough with congestion.  He is not describing PND or orthopnea.  He is not noticing any palpitations, presyncope or syncope.  He has had none of the presyncopal episodes that he was having.  He is not having any new chest pressure, neck or arm discomfort.   Past Medical History:  Diagnosis Date  . Acute asthmatic bronchitis   . Anemia   . Anxiety disorder   . Borderline diabetes mellitus   . Borderline hypertension   . Degenerative joint disease   . Depression   . GERD (gastroesophageal reflux disease)   . History of  adenomatous polyp of colon   . History of sinusitis   . Posttraumatic stress disorder   . Sleep apnea    Uses CPAP    Past Surgical History:  Procedure Laterality Date  . COLONOSCOPY    . FINGER SURGERY Right    index  . NASAL SINUS SURGERY     x 2  . POLYPECTOMY       Current Outpatient Medications  Medication Sig Dispense Refill  . albuterol (PROVENTIL HFA;VENTOLIN HFA) 108 (90 Base) MCG/ACT inhaler INHALE 2 PUFFS EVERY 4 HOURS AS NEEDED FOR COUGH OR WHEEZE 8.5 Inhaler 0  . BREO ELLIPTA 200-25 MCG/INH AEPB TAKE 1 PUFF BY MOUTH EVERY DAY 60 each 1  . [facility] VITAMIN B12 1000 MCG tablet TAKE 1 TABLET (1,000 MCG TOTAL) BY MOUTH DAILY. 90 tablet 1  . esomeprazole (NEXIUM) 40 MG capsule Take 1 capsule (40 mg total) by mouth daily at 12 noon. 30 capsule 5  . ketoconazole (NIZORAL) 2 % shampoo Apply 1 application topically 2 (two) times a week.     . montelukast (SINGULAIR) 10 MG tablet TAKE 1 TABLET BY MOUTH EVERYDAY AT BEDTIME 90 tablet 0  . predniSONE (DELTASONE) 20 MG  tablet Take 1 tablet by mouth once daily for 5 days 5 tablet 0  . PROAIR HFA 108 (90 Base) MCG/ACT inhaler INHALE 2 PUFFS EVERY 4 HOURS AS NEEDED FOR COUGH OR WHEEZE 8.5 Inhaler 1  . Propylene Glycol (SYSTANE BALANCE OP) Apply 1 drop to eye daily as needed (dry eyes). Reported on 12/19/2015    . triamcinolone (NASACORT ALLERGY 24HR) 55 MCG/ACT AERO nasal inhaler Use one spray in each nostril once daily as directed. 1 Inhaler 5   No current facility-administered medications for this visit.     Allergies:   Patient has no known allergies.    ROS:  Please see the history of present illness.   Otherwise, review of systems are positive for none.   All other systems are reviewed and negative.    PHYSICAL EXAM: VS:  BP 128/73   Pulse 62   Ht 6\' 2"  (1.88 m)   Wt 270 lb 9.6 oz (122.7 kg)   BMI 34.74 kg/m  , BMI Body mass index is 34.74 kg/m. GENERAL:  Well appearing NECK:  No jugular venous distention, waveform within  normal limits, carotid upstroke brisk and symmetric, no bruits, no thyromegaly LUNGS:  Clear to auscultation bilaterally CHEST:  Unremarkable HEART:  PMI not displaced or sustained,S1 and S2 within normal limits, no S3, no S4, no clicks, no rubs, 2 out of 6 apical systolic murmur radiating slightly at the aortic outflow tract, no diastolic murmurs ABD:  Flat, positive bowel sounds normal in frequency in pitch, no bruits, no rebound, no guarding, no midline pulsatile mass, no hepatomegaly, no splenomegaly EXT:  2 plus pulses throughout, mild bilateral edema, no cyanosis no clubbing   EKG:  EKG is not  ordered today.    Recent Labs: 01/02/2018: ALT 7; BUN 10; Creatinine, Ser 1.09; Hemoglobin 11.7; Platelets 145.0; Potassium 4.0; Pro B Natriuretic peptide (BNP) 31.0; Sodium 138; TSH 3.23    Lipid Panel    Component Value Date/Time   CHOL 126 01/02/2018 0954   TRIG 50.0 01/02/2018 0954   HDL 46.30 01/02/2018 0954   CHOLHDL 3 01/02/2018 0954   VLDL 10.0 01/02/2018 0954   LDLCALC 70 01/02/2018 0954      Wt Readings from Last 3 Encounters:  07/30/18 270 lb 9.6 oz (122.7 kg)  04/30/18 274 lb (124.3 kg)  04/17/18 263 lb (119.3 kg)      Other studies Reviewed: Additional studies/ records that were reviewed today include:  None Review of the above records demonstrates:     ASSESSMENT AND PLAN:  PALPITATIONS:   Unfortunately had not been able to get him back to the treadmill which I wanted to do to look for chronotropic competence.  As previously stated he has had some bradycardia but I did not clearly see heart block.  I wanted to test to see if he developed symptoms on a treadmill but he did not present with this.  He does want to reschedule it at this point.  He is not had any further presyncopal episodes.  At this point he will call me back when he is able to schedule the treadmill test.  Of note this would need to be a modified Bruce most likely.   POSSIBLE PREVIOUS MI:    No  change in therapy.  I will follow-up as above.  MURMUR: He does consent to getting the echocardiogram.  I been unable to get records from the [place].  SLEEP APNEA: He says he is wears CPAP but he is not  on it right now because of his upper respiratory problems.  We did discuss the correlation between bradycardia and sleep apnea.  Current medicines are reviewed at length with the patient today.  The patient does not have concerns regarding medicines.  The following changes have been made:  None  Labs/ tests ordered today include: Echo  Orders Placed This Encounter  Procedures  . ECHOCARDIOGRAM COMPLETE     Disposition:   FU with me after the echo.    Signed, Minus Breeding, MD  07/30/2018 5:27 PM    [facility]

## 2018-07-30 ENCOUNTER — Encounter: Payer: Self-pay | Admitting: Cardiology

## 2018-07-30 ENCOUNTER — Ambulatory Visit (INDEPENDENT_AMBULATORY_CARE_PROVIDER_SITE_OTHER): Payer: PPO | Admitting: Cardiology

## 2018-07-30 VITALS — BP 128/73 | HR 62 | Ht 74.0 in | Wt 270.6 lb

## 2018-07-30 DIAGNOSIS — R002 Palpitations: Secondary | ICD-10-CM | POA: Diagnosis not present

## 2018-07-30 DIAGNOSIS — R011 Cardiac murmur, unspecified: Secondary | ICD-10-CM | POA: Diagnosis not present

## 2018-07-30 DIAGNOSIS — R001 Bradycardia, unspecified: Secondary | ICD-10-CM | POA: Diagnosis not present

## 2018-07-30 DIAGNOSIS — G4730 Sleep apnea, unspecified: Secondary | ICD-10-CM | POA: Diagnosis not present

## 2018-07-30 NOTE — Patient Instructions (Signed)
Medication Instructions:  Continue current medications  If you need a refill on your cardiac medications before your next appointment, please call your pharmacy.  Labwork: None Ordered   Take the provided lab slips with you to the lab for your blood draw.   When you have your labs (blood work) drawn today and your tests are completely normal, you will receive your results only by MyChart Message (if you have MyChart) -OR-  A paper copy in the mail.  If you have any lab test that is abnormal or we need to change your treatment, we will call you to review these results.  Testing/Procedures: Your physician has requested that you have an echocardiogram. Echocardiography is a painless test that uses sound waves to create images of your heart. It provides your doctor with information about the size and shape of your heart and how well your heart's chambers and valves are working. This procedure takes approximately one hour. There are no restrictions for this procedure.  Follow-Up: . You will need a follow up appointment in After Echo  At [facility], you and your health needs are our priority.  As part of our continuing mission to provide you with exceptional heart care, we have created designated Provider Care Teams.  These Care Teams include your primary Cardiologist (physician) and Advanced Practice Providers (APPs -  Physician Assistants and Nurse Practitioners) who all work together to provide you with the care you need, when you need it.  Thank you for choosing [facility] at [facility]!!

## 2018-07-31 ENCOUNTER — Telehealth: Payer: Self-pay | Admitting: Cardiology

## 2018-07-31 NOTE — Telephone Encounter (Signed)
Left message at 3:42pm for patient to call and schedule Echo and f/u appointment with Dr. Percival Spanish

## 2018-08-05 ENCOUNTER — Other Ambulatory Visit: Payer: Self-pay | Admitting: Allergy and Immunology

## 2018-08-05 ENCOUNTER — Telehealth: Payer: Self-pay

## 2018-08-05 MED ORDER — FLUTICASONE FUROATE-VILANTEROL 200-25 MCG/INH IN AEPB: 1.0000 | INHALATION_SPRAY | Freq: Every day | RESPIRATORY_TRACT | 1 refills | Status: DC

## 2018-08-05 NOTE — Telephone Encounter (Signed)
Left message for patient to call and schedule Echo and f/u with Dr. Percival Spanish

## 2018-08-05 NOTE — Telephone Encounter (Signed)
Request for a refill for [facility] was put in.

## 2018-08-10 ENCOUNTER — Other Ambulatory Visit (HOSPITAL_COMMUNITY): Payer: PPO

## 2018-08-10 DIAGNOSIS — R0989 Other specified symptoms and signs involving the circulatory and respiratory systems: Secondary | ICD-10-CM

## 2018-08-18 ENCOUNTER — Encounter: Payer: Self-pay | Admitting: Allergy and Immunology

## 2018-08-18 ENCOUNTER — Ambulatory Visit (INDEPENDENT_AMBULATORY_CARE_PROVIDER_SITE_OTHER): Payer: PPO | Admitting: Allergy and Immunology

## 2018-08-18 ENCOUNTER — Telehealth: Payer: Self-pay

## 2018-08-18 VITALS — BP 150/84 | HR 64 | Resp 16

## 2018-08-18 DIAGNOSIS — J3089 Other allergic rhinitis: Secondary | ICD-10-CM

## 2018-08-18 DIAGNOSIS — G4733 Obstructive sleep apnea (adult) (pediatric): Secondary | ICD-10-CM | POA: Diagnosis not present

## 2018-08-18 DIAGNOSIS — J0140 Acute pansinusitis, unspecified: Secondary | ICD-10-CM

## 2018-08-18 DIAGNOSIS — J4540 Moderate persistent asthma, uncomplicated: Secondary | ICD-10-CM | POA: Diagnosis not present

## 2018-08-18 DIAGNOSIS — K219 Gastro-esophageal reflux disease without esophagitis: Secondary | ICD-10-CM

## 2018-08-18 MED ORDER — MONTELUKAST SODIUM 10 MG PO TABS: 10.0000 mg | ORAL_TABLET | Freq: Every day | ORAL | 1 refills | Status: DC

## 2018-08-18 MED ORDER — FLUTICASONE FUROATE-VILANTEROL 200-25 MCG/INH IN AEPB: 1.0000 | INHALATION_SPRAY | Freq: Every day | RESPIRATORY_TRACT | 1 refills | Status: DC

## 2018-08-18 MED ORDER — AMOXICILLIN-POT CLAVULANATE 875-125 MG PO TABS: 1.0000 | ORAL_TABLET | Freq: Two times a day (BID) | ORAL | 0 refills | Status: AC

## 2018-08-18 MED ORDER — ESOMEPRAZOLE MAGNESIUM 40 MG PO CPDR: 40.0000 mg | DELAYED_RELEASE_CAPSULE | Freq: Two times a day (BID) | ORAL | 1 refills | Status: DC

## 2018-08-18 MED ORDER — ALBUTEROL SULFATE HFA 108 (90 BASE) MCG/ACT IN AERS: 2.0000 | INHALATION_SPRAY | Freq: Four times a day (QID) | RESPIRATORY_TRACT | 1 refills | Status: DC | PRN

## 2018-08-18 NOTE — Patient Instructions (Addendum)
  1. Treat and prevent inflammation:   A. OTC Nasacort one spray each nostril 1 time per day  B. montelukast 10 mg one tablet once a day  C. Breo 200 one inhalation 1 time per day.    D. Prednisone 10mg  - 2 tablets 1 time per day for 5 days  2. Treat and prevent reflux:   A. INCREASE esomeprazole 40mg  2 times per day  B. Eliminate caffeine consumption  3. Treat infection:   A. Augmentin 875 - 1 tablet 2 times per day for 14 days   4. If needed:   A. nasal saline  B. OTC antihistamine  - Allegra    C. Proventil HFA 2 puffs every 4-6 hours  D. OTC Mucinex DM 2 tablets twice a day  5. Continue CPAP   6. Return to clinic in 6 months or earlier if problem  7. Obtain flu vaccine

## 2018-08-18 NOTE — Telephone Encounter (Signed)
New message    Just an FYI. We have made several attempts to contact this patient including sending a letter to schedule or reschedule their echocardiogram. We will be removing the patient from the echo WQ.   Thank you 

## 2018-08-18 NOTE — Progress Notes (Signed)
Follow-up Note  Referring Provider: [name], * Primary Provider: [name], MD Date of Office Visit: 08/18/2018  Subjective:   Shane [name] (DOB: 12-16-1945) is a 73 y.o. male who returns to the [facility] on 08/18/2018 in re-evaluation of the following:  HPI: Shane [name] returns to this clinic in reevaluation of his asthma and nonallergic rhinitis and history of intermittent sinusitis and LPR and sleep apnea.  I have not seen him in his clinic since 27 January 2018.  Overall he appears to be doing relatively well.  It sounds as though he required one antibiotic in December 2019 for an episode of sinusitis but otherwise has not required any systemic steroid or antibiotics and for the most part does relatively well while he uses a combination of therapy directed against respiratory tract inflammation and reflux.  His requirement for short acting bronchodilator is less than 1 time per week.  Unfortunately, about 2 weeks ago he started a cough with some thick white sputum production and has been having lots of drainage in his throat and some degree of nasal congestion.  He has not had any anosmia or fever or chest pain.  He does relate a history of developing problems with reflux.  He has heartburn in the middle of his chest.  This occurs even though he uses Nexium 40 mg daily.  He is intolerant of adding an H2 receptor blocker because he develops GI upset when utilizing this agent.  He still drinks 1 tea per day.  He continues on CPAP for his sleep apnea.  However, because of his cough for the past 2 weeks he has not been using this device.  He has not received the flu vaccine to date.  Allergies as of 08/18/2018   No Known Allergies     Medication List      [facility] VITAMIN B12 1000 MCG tablet Generic drug:  cyanocobalamin TAKE 1 TABLET (1,000 MCG TOTAL) BY MOUTH DAILY.   esomeprazole 40 MG capsule Commonly known as:  NEXIUM Take 1 capsule  (40 mg total) by mouth daily at 12 noon.   fluticasone furoate-vilanterol 200-25 MCG/INH Aepb Commonly known as:  BREO ELLIPTA Inhale 1 puff into the lungs daily.   ketoconazole 2 % shampoo Commonly known as:  NIZORAL Apply 1 application topically 2 (two) times a week.   montelukast 10 MG tablet Commonly known as:  SINGULAIR TAKE 1 TABLET BY MOUTH EVERYDAY AT BEDTIME   predniSONE 20 MG tablet Commonly known as:  DELTASONE Take 1 tablet by mouth once daily for 5 days   PROAIR HFA 108 (90 Base) MCG/ACT inhaler Generic drug:  albuterol INHALE 2 PUFFS EVERY 4 HOURS AS NEEDED FOR COUGH OR WHEEZE   albuterol 108 (90 Base) MCG/ACT inhaler Commonly known as:  PROVENTIL HFA;VENTOLIN HFA INHALE 2 PUFFS EVERY 4 HOURS AS NEEDED FOR COUGH OR WHEEZE   SYSTANE BALANCE OP Apply 1 drop to eye daily as needed (dry eyes). Reported on 12/19/2015   triamcinolone 55 MCG/ACT Aero nasal inhaler Commonly known as:  NASACORT ALLERGY 24HR Use one spray in each nostril once daily as directed.       Past Medical History:  Diagnosis Date  . Acute asthmatic bronchitis   . Anemia   . Anxiety disorder   . Borderline diabetes mellitus   . Borderline hypertension   . Degenerative joint disease   . Depression   . GERD (gastroesophageal reflux disease)   . History of  adenomatous polyp of colon   . History of sinusitis   . Posttraumatic stress disorder   . Sleep apnea    Uses CPAP    Past Surgical History:  Procedure Laterality Date  . COLONOSCOPY    . FINGER SURGERY Right    index  . NASAL SINUS SURGERY     x 2  . POLYPECTOMY      Review of systems negative except as noted in HPI / PMHx or noted below:  Review of Systems  Constitutional: Negative.   HENT: Negative.   Eyes: Negative.   Respiratory: Negative.   Cardiovascular: Negative.   Gastrointestinal: Negative.   Genitourinary: Negative.   Musculoskeletal: Negative.   Skin: Negative.   Neurological: Negative.     Endo/Heme/Allergies: Negative.   Psychiatric/Behavioral: Negative.      Objective:   Vitals:   08/18/18 1547  BP: (!) 150/84  Pulse: 64  Resp: 16  SpO2: 97%          Physical Exam Constitutional:      Appearance: He is not diaphoretic.  HENT:     Head: Normocephalic.     Right Ear: Tympanic membrane, ear canal and external ear normal.     Left Ear: Tympanic membrane, ear canal and external ear normal.     Nose: Nose normal. No mucosal edema or rhinorrhea.     Mouth/Throat:     Pharynx: Uvula midline. No oropharyngeal exudate.  Eyes:     Conjunctiva/sclera: Conjunctivae normal.  Neck:     Thyroid: No thyromegaly.     Trachea: Trachea normal. No tracheal tenderness or tracheal deviation.  Cardiovascular:     Rate and Rhythm: Normal rate and regular rhythm.     Heart sounds: Normal heart sounds, S1 normal and S2 normal. No murmur.  Pulmonary:     Effort: No respiratory distress.     Breath sounds: Normal breath sounds. No stridor. No wheezing or rales.  Lymphadenopathy:     Head:     Right side of head: No tonsillar adenopathy.     Left side of head: No tonsillar adenopathy.     Cervical: No cervical adenopathy.  Skin:    Findings: No erythema or rash.     Nails: There is no clubbing.   Neurological:     Mental Status: He is alert.     Diagnostics:    Spirometry was performed and demonstrated an FEV1 of 2.96 at 90 % of predicted.  Assessment and Plan:   1. Asthma, moderate persistent, well-controlled   2. Other allergic rhinitis   3. LPRD (laryngopharyngeal reflux disease)   4. Sleep apnea, obstructive   5. Acute non-recurrent pansinusitis     1. Treat and prevent inflammation:   A. OTC Nasacort one spray each nostril 1 time per day  B. montelukast 10 mg one tablet once a day  C. Breo 200 one inhalation 1 time per day.    D. Prednisone 10mg  - 2 tablets 1 time per day for 5 days  2. Treat and prevent reflux:   A. INCREASE esomeprazole 40mg  2  times per day  B. Eliminate caffeine consumption  3. Treat infection:   A. Augmentin 875 - 1 tablet 2 times per day for 14 days   4. If needed:   A. nasal saline  B. OTC antihistamine  - Allegra    C. Proventil HFA 2 puffs every 4-6 hours  D. OTC Mucinex DM 2 tablets twice a day  5. Continue CPAP  6. Return to clinic in 6 months or earlier if problem  7. Obtain flu vaccine  Jarvis appears to be doing okay on his current medical therapy which includes treatment directed against respiratory tract inflammation and reflux but he appears to have contracted a recent respiratory tract infection and his reflux is not under good control on Nexium once a day.  We will treat him with antibiotics and a very short course of systemic steroids to address his respiratory tract infection and he will remain on anti-inflammatory agents for his airway as noted above and I have increased his therapy for reflux having him use Nexium twice a day.  We will see how things go over the course of the next several months.  If he does well I will see him back in his clinic in 6 months or earlier if there is a problem.  Allena Katz, MD Allergy / Immunology [place]

## 2018-08-19 ENCOUNTER — Encounter: Payer: Self-pay | Admitting: Allergy and Immunology

## 2018-08-19 NOTE — Progress Notes (Deleted)
Cardiology Office Note   Date:  08/19/2018   ID:  Shane [name], DOB [birth year], MRN [id number]  PCP:  Shane [name], Shane  Cardiologist:   No primary care provider on file. Referring:  Shane [name], Shane    No chief complaint on file.     History of Present Illness: Shane [name] is a 73 y.o. male who is referred by Shane [name], Shane [name].  The patient reports that he was told he had a heart attack years ago.  He was seen at the [place] and he said he had some heart damage but he does not report having a catheterization.  He did have a POET (Plain Old Exercise Treadmill) in 2016.  There was no ischemia on this but it was submax as he had SOB and could not walk the treadmill.  At my first appt with him he was noticing skipped beats.    I ordered a Holter.  He had PVCs and PACs.  There was a question of short runs of CHB.  He saw me recently and was to have followup testing with an echo  However, he failed to present for this.  I saw him back and we discussed his transportation issues.  I rescheduled the echo but he was given a no-show for this.  I also wanted to do a POET (Plain Old Exercise Treadmill) for evaluation of chronotropic incompetence but he has now wanted to call and schedule this and has not yet done this. ***    I do note that he walked into the office afterwards and reschedule his appointments but he failed to make these as well.  We have been unable to contact him by phone.  It turns out he can receive messages but he says he cannot call out.  He has been fighting bronchitis.  He now has gout.  He did not do the treadmill because of these conditions.  He still has a bit of a cough with congestion.  He is not describing PND or orthopnea.  He is not noticing any [name], presyncope or syncope.  He has had none of the presyncopal episodes that he was having.  He is not having any new chest pressure, neck or arm  discomfort.   Past Medical History:  Diagnosis Date  . Acute asthmatic bronchitis   . Anemia   . Anxiety disorder   . Borderline diabetes mellitus   . Borderline hypertension   . Degenerative joint disease   . Depression   . GERD (gastroesophageal reflux disease)   . History of adenomatous polyp of colon   . History of sinusitis   . Posttraumatic stress disorder   . Sleep apnea    Uses CPAP    Past Surgical History:  Procedure Laterality Date  . COLONOSCOPY    . FINGER SURGERY Right    index  . NASAL SINUS SURGERY     x 2  . POLYPECTOMY       Current Outpatient Medications  Medication Sig Dispense Refill  . albuterol (PROAIR HFA) 108 (90 Base) MCG/ACT inhaler Inhale 2 puffs into the lungs every 6 (six) hours as needed for wheezing or shortness of breath. 8.5 Inhaler 1  . amoxicillin-clavulanate (AUGMENTIN) 875-125 MG tablet Take 1 tablet by mouth 2 (two) times daily for 14 days. 28 tablet 0  . [facility] VITAMIN B12 1000 MCG tablet TAKE 1 TABLET (1,000 MCG TOTAL) BY MOUTH DAILY. 90 tablet 1  .  esomeprazole (NEXIUM) 40 MG capsule Take 1 capsule (40 mg total) by mouth 2 (two) times daily before a meal. 180 capsule 1  . fluticasone furoate-vilanterol (BREO ELLIPTA) 200-25 MCG/INH AEPB Inhale 1 puff into the lungs daily. 90 each 1  . ketoconazole (NIZORAL) 2 % shampoo Apply 1 application topically 2 (two) times a week.     . montelukast (SINGULAIR) 10 MG tablet Take 1 tablet (10 mg total) by mouth at bedtime. 90 tablet 1  . Propylene Glycol (SYSTANE BALANCE OP) Apply 1 drop to eye daily as needed (dry eyes). Reported on 12/19/2015    . triamcinolone (NASACORT ALLERGY 24HR) 55 MCG/ACT AERO nasal inhaler Use one spray in each nostril once daily as directed. 1 Inhaler 5   No current facility-administered medications for this visit.     Allergies:   Patient has no known allergies.    ROS:  Please see the history of present illness.   Otherwise, review of systems are positive for ***.    All other systems are reviewed and negative.    PHYSICAL EXAM: VS:  There were no vitals taken for this visit. , BMI There is no height or weight on file to calculate BMI. GENERAL:  Well appearing NECK:  No jugular venous distention, waveform within normal limits, carotid upstroke brisk and symmetric, no bruits, no thyromegaly LUNGS:  Clear to auscultation bilaterally CHEST:  Unremarkable HEART:  PMI not displaced or sustained,S1 and S2 within normal limits, no S3, no S4, no clicks, no rubs, *** murmurs ABD:  Flat, positive bowel sounds normal in frequency in pitch, no bruits, no rebound, no guarding, no midline pulsatile mass, no hepatomegaly, no splenomegaly EXT:  2 plus pulses throughout, no edema, no cyanosis no clubbing   ***GENERAL:  Well appearing NECK:  No jugular venous distention, waveform within normal limits, carotid upstroke brisk and symmetric, no bruits, no thyromegaly LUNGS:  Clear to auscultation bilaterally CHEST:  Unremarkable HEART:  PMI not displaced or sustained,S1 and S2 within normal limits, no S3, no S4, no clicks, no rubs, 2 out of 6 apical systolic murmur radiating slightly at the aortic outflow tract, no diastolic murmurs ABD:  Flat, positive bowel sounds normal in frequency in pitch, no bruits, no rebound, no guarding, no midline pulsatile mass, no hepatomegaly, no splenomegaly EXT:  2 plus pulses throughout, mild bilateral edema, no cyanosis no clubbing   EKG:  EKG is not ***ordered today. ***   Recent Labs: 01/02/2018: ALT 7; BUN 10; Creatinine, Ser 1.09; Hemoglobin 11.7; Platelets 145.0; Potassium 4.0; Pro B Natriuretic peptide (BNP) 31.0; Sodium 138; TSH 3.23    Lipid Panel    Component Value Date/Time   CHOL 126 01/02/2018 0954   TRIG 50.0 01/02/2018 0954   HDL 46.30 01/02/2018 0954   CHOLHDL 3 01/02/2018 0954   VLDL 10.0 01/02/2018 0954   LDLCALC 70 01/02/2018 0954      Wt Readings from Last 3 Encounters:  07/30/18 270 lb 9.6 oz (122.7  kg)  04/30/18 274 lb (124.3 kg)  04/17/18 263 lb (119.3 kg)      Other studies Reviewed: Additional studies/ records that were reviewed today include:  *** Review of the above records demonstrates:  ***   ASSESSMENT AND PLAN:  [name]:  ***   Unfortunately had not been able to get him back to the treadmill which I wanted to do to look for chronotropic competence.  As previously stated he has had some bradycardia but I did not clearly see heart  block.  I wanted to test to see if he developed symptoms on a treadmill but he did not present with this.  He does want to reschedule it at this point.  He is not had any further presyncopal episodes.  At this point he will call me back when he is able to schedule the treadmill test.  Of note this would need to be a modified Bruce most likely.   POSSIBLE PREVIOUS MI:    ***  No change in therapy.  I will follow-up as above.  MURMUR:  ***  He does consent to getting the echocardiogram.  I been unable to get records from the [place].  SLEEP APNEA: ***  He says he is wears CPAP but he is not on it right now because of his upper respiratory problems.  We did discuss the correlation between bradycardia and sleep apnea.  Current medicines are reviewed at length with the patient today.  The patient does not have concerns regarding medicines.  The following changes have been made:  ***  Labs/ tests ordered today include: ***  No orders of the defined types were placed in this encounter.    Disposition:   FU with me after the ***.    Signed, Minus Breeding, Shane  08/19/2018 1:49 PM    [facility]

## 2018-08-20 ENCOUNTER — Ambulatory Visit: Payer: PPO | Admitting: Cardiology

## 2018-08-20 DIAGNOSIS — R0989 Other specified symptoms and signs involving the circulatory and respiratory systems: Secondary | ICD-10-CM

## 2018-08-21 ENCOUNTER — Encounter: Payer: Self-pay | Admitting: *Deleted

## 2018-08-31 ENCOUNTER — Other Ambulatory Visit (HOSPITAL_COMMUNITY): Payer: PPO

## 2018-09-02 ENCOUNTER — Ambulatory Visit (HOSPITAL_COMMUNITY): Payer: PPO | Attending: Cardiology

## 2018-09-02 DIAGNOSIS — R011 Cardiac murmur, unspecified: Secondary | ICD-10-CM | POA: Diagnosis not present

## 2018-09-10 ENCOUNTER — Encounter: Payer: Self-pay | Admitting: *Deleted

## 2018-09-28 ENCOUNTER — Telehealth: Payer: Self-pay | Admitting: Allergy and Immunology

## 2018-09-28 NOTE — Telephone Encounter (Signed)
Patient believes he has bronchitis. He would like something called in unless Dr. Neldon Mc needs to speak with him first. [facility]. It is ok to leave a detailed message at this phone number.

## 2018-09-28 NOTE — Telephone Encounter (Signed)
Have him come in for an appointment.

## 2018-09-28 NOTE — Telephone Encounter (Signed)
lmom for patient to call back to schedule OV

## 2018-09-28 NOTE — Telephone Encounter (Signed)
Called and spoke with the patient and he stated that he has been having a productive cough with light gray thick mucous. He said that he has not had any fevers or wheezing but that he has not been sleeping well, he claims that he is not tired and cannot sleep. He does state that he has a runny nose from time to time. He thinks that he has bronchitis and would like for something to be prescribed. Please advise.

## 2018-09-29 ENCOUNTER — Ambulatory Visit (INDEPENDENT_AMBULATORY_CARE_PROVIDER_SITE_OTHER): Payer: PPO | Admitting: Allergy and Immunology

## 2018-09-29 ENCOUNTER — Encounter: Payer: Self-pay | Admitting: Allergy and Immunology

## 2018-09-29 ENCOUNTER — Other Ambulatory Visit: Payer: Self-pay

## 2018-09-29 VITALS — BP 158/80 | HR 60 | Temp 98.2°F | Resp 18

## 2018-09-29 DIAGNOSIS — G4733 Obstructive sleep apnea (adult) (pediatric): Secondary | ICD-10-CM | POA: Diagnosis not present

## 2018-09-29 DIAGNOSIS — K219 Gastro-esophageal reflux disease without esophagitis: Secondary | ICD-10-CM | POA: Diagnosis not present

## 2018-09-29 DIAGNOSIS — J3089 Other allergic rhinitis: Secondary | ICD-10-CM

## 2018-09-29 DIAGNOSIS — J4540 Moderate persistent asthma, uncomplicated: Secondary | ICD-10-CM

## 2018-09-29 NOTE — Patient Instructions (Addendum)
  1. Treat and prevent inflammation:   A. OTC Nasacort one spray each nostril 1 time per day  B. montelukast 10 mg one tablet once a day  C. Breo 200 one inhalation 1 time per day.    2. Treat and prevent reflux:   A. esomeprazole 40mg  2 times per day  B. Continue to Eliminate caffeine consumption  3. If needed:   A. nasal saline  B. OTC antihistamine  - Allegra    C. Proventil HFA 2 puffs every 4-6 hours  D. OTC Mucinex DM 2 tablets twice a day  5. Continue CPAP   6. Return to clinic in 6 months or earlier if problem

## 2018-09-29 NOTE — Progress Notes (Signed)
[facility]   Follow-up Note  Referring Provider: [name], * Primary Provider: [name], MD Date of Office Visit: 09/29/2018  Subjective:   Shane [name] (DOB: 1945/08/29) is a 73 y.o. male who returns to the [facility] on 09/29/2018 in re-evaluation of the following:  HPI: Moataz returns to this clinic in reevaluation of asthma, nonallergic rhinitis, and a history of intermittent sinusitis and LPR and sleep apnea.  I last saw him in his clinic on 18 August 2018 at which point in time he was having what appeared to be an episode of sinusitis requiring a broad-spectrum antibiotic and a very short course of low-dose steroid.  He resolved that issue addressed during his last evaluation and was doing quite well but unfortunately about 2 weeks ago he developed achiness across all of his body to the point where he could not put his clothes on.  Apparently he did check his temperature with this event and did not have a fever but he felt very fatigued.  This lasted about 3 days and ever since that point in time he has had a little bit of coughing.  This coughing is not keeping him awake at nighttime.  He does have some mucus when he coughs and it might be slightly tinged gray but there is been no green or yellow.  He can breathe fine without any shortness of breath.  He has not had any significant upper airway symptoms except some occasional upper airway stuffiness and some clear drainage.  When he was last seen in this clinic as well he did have a problem with reflux and still had heartburn.  He did increase his Nexium to twice a day as instructed and came off all caffeine and this is much better at this point in time.  He continues on CPAP for his sleep apnea which is working quite well.  Allergies as of 09/29/2018   No Known Allergies     Medication List      albuterol 108 (90 Base) MCG/ACT  inhaler Commonly known as:  ProAir HFA Inhale 2 puffs into the lungs every 6 (six) hours as needed for wheezing or shortness of breath.   [employer] VITAMIN B12 1000 MCG tablet Generic drug:  cyanocobalamin TAKE 1 TABLET (1,000 MCG TOTAL) BY MOUTH DAILY.   esomeprazole 40 MG capsule Commonly known as:  NexIUM Take 1 capsule (40 mg total) by mouth 2 (two) times daily before a meal.   fluticasone furoate-vilanterol 200-25 MCG/INH Aepb Commonly known as:  Breo Ellipta Inhale 1 puff into the lungs daily.   ketoconazole 2 % shampoo Commonly known as:  NIZORAL Apply 1 application topically 2 (two) times a week.   montelukast 10 MG tablet Commonly known as:  SINGULAIR Take 1 tablet (10 mg total) by mouth at bedtime.   SYSTANE BALANCE OP Apply 1 drop to eye daily as needed (dry eyes). Reported on 12/19/2015   triamcinolone 55 MCG/ACT Aero nasal inhaler Commonly known as:  Nasacort Allergy 24HR Use one spray in each nostril once daily as directed.       Past Medical History:  Diagnosis Date  . Acute asthmatic bronchitis   . Anemia   . Anxiety disorder   . Borderline diabetes mellitus   . Borderline hypertension   . Degenerative joint disease   . Depression   . GERD (gastroesophageal reflux disease)   . History of adenomatous polyp of  colon   . History of sinusitis   . Posttraumatic stress disorder   . Sleep apnea    Uses CPAP    Past Surgical History:  Procedure Laterality Date  . COLONOSCOPY    . FINGER SURGERY Right    index  . NASAL SINUS SURGERY     x 2  . POLYPECTOMY      Review of systems negative except as noted in HPI / PMHx or noted below:  Review of Systems  Constitutional: Negative.   HENT: Negative.   Eyes: Negative.   Respiratory: Negative.   Cardiovascular: Negative.   Gastrointestinal: Negative.   Genitourinary: Negative.   Musculoskeletal: Negative.   Skin: Negative.   Neurological: Negative.   Endo/Heme/Allergies: Negative.    Psychiatric/Behavioral: Negative.      Objective:   Vitals:   09/29/18 1136  BP: (!) 158/80  Pulse: 60  Resp: 18  Temp: 98.2 F (36.8 C)  SpO2: 97%          Physical Exam Constitutional:      Appearance: He is not diaphoretic.  HENT:     Head: Normocephalic.     Right Ear: Tympanic membrane, ear canal and external ear normal.     Left Ear: Tympanic membrane, ear canal and external ear normal.     Nose: Nose normal. No mucosal edema or rhinorrhea.     Mouth/Throat:     Pharynx: Uvula midline. No oropharyngeal exudate.  Eyes:     Conjunctiva/sclera: Conjunctivae normal.  Neck:     Thyroid: No thyromegaly.     Trachea: Trachea normal. No tracheal tenderness or tracheal deviation.  Cardiovascular:     Rate and Rhythm: Normal rate and regular rhythm.     Heart sounds: Normal heart sounds, S1 normal and S2 normal. No murmur.  Pulmonary:     Effort: No respiratory distress.     Breath sounds: Normal breath sounds. No stridor. No wheezing or rales.  Lymphadenopathy:     Head:     Right side of head: No tonsillar adenopathy.     Left side of head: No tonsillar adenopathy.     Cervical: No cervical adenopathy.  Skin:    Findings: No erythema or rash.     Nails: There is no clubbing.   Neurological:     Mental Status: He is alert.     Diagnostics:    Spirometry was not perfromed.  Assessment and Plan:   1. Asthma, moderate persistent, well-controlled   2. Other allergic rhinitis   3. Sleep apnea, obstructive   4. LPRD (laryngopharyngeal reflux disease)     1. Treat and prevent inflammation:   A. OTC Nasacort one spray each nostril 1 time per day  B. montelukast 10 mg one tablet once a day  C. Breo 200 one inhalation 1 time per day.    2. Treat and prevent reflux:   A. esomeprazole 40mg  2 times per day  B. Continue to Eliminate caffeine consumption  3. If needed:   A. nasal saline  B. OTC antihistamine  - Allegra    C. Proventil HFA 2 puffs every  4-6 hours  D. OTC Mucinex DM 2 tablets twice a day  5. Continue CPAP   6. Return to clinic in 6 months or earlier if problem   Cadin appears to be doing quite well on his current therapy other than the fact that he probably had some type of viral infection about 2 weeks ago fortunately without any fever  and all of his constitutional symptoms associated with that event have resolved.  He probably has a little bit of lingering irritation of his airway from that event and I am not going to prescribe any additional therapy assuming that this will continue to improve with each passing week.  He will remain on anti-inflammatory therapies for his airway and therapy directed against reflux and therapy directed against sleep apnea as noted above and I will see him back in this clinic in 6 months or earlier if there is a problem.   Allena Katz, MD Allergy / Immunology [place]

## 2018-09-30 ENCOUNTER — Encounter: Payer: Self-pay | Admitting: Allergy and Immunology

## 2018-10-21 NOTE — Progress Notes (Unsigned)
{Choose 1 Note Type (Telehealth Visit or Telephone Visit):[phone]}   Evaluation Performed:  Follow-up visit  Date:  10/21/2018   ID:  Shane [name], DOB 10/03/1945, MRN [id number]  {Patient [phone]::"Home"} {Provider Location:[phone]}  PCP:  [name], MD  Cardiologist:  No primary care provider on file. *** Electrophysiologist:  None   Chief Complaint:  ***  History of Present Illness:    Shane [name] is a 73 y.o. male for follow up of palpitations.  The patient reports that he was told he had a heart attack years ago.  He was seen at the [place] [facility] and he said he had some heart damage but he does not report having a catheterization.  He did have a POET (Plain Old Exercise Treadmill) in 2016.  There was no ischemia on this but it was submax as he had SOB and could not walk the treadmill.  At my first appt with him he was noticing skipped beats.    I ordered a Holter.  He had PVCs and PACs.  There was a question of short runs of CHB.   He had a follow up echo with mild septal hypertrophy.  He had a normal EF.    ***    He saw me recently and was to have followup testing with an echo.  However, he failed to present for this.  I do note that he walked into the office afterwards and reschedule his appointments but he failed to make these as well.  We have been unable to contact him by phone.  It turns out he can receive messages but he says he cannot call out.  He has been fighting bronchitis.  He now has gout.  He did not do the treadmill because of these conditions.  He still has a bit of a cough with congestion.  He is not describing PND or orthopnea.  He is not noticing any palpitations, presyncope or syncope.  He has had none of the presyncopal episodes that he was having.  He is not having any new chest pressure, neck or arm discomfort.   The patient {does/does not:200015} have symptoms concerning for COVID-19 infection (fever, chills, cough, or new  shortness of breath).    Past Medical History:  Diagnosis Date  . Acute asthmatic bronchitis   . Anemia   . Anxiety disorder   . Borderline diabetes mellitus   . Borderline hypertension   . Degenerative joint disease   . Depression   . GERD (gastroesophageal reflux disease)   . History of adenomatous polyp of colon   . History of sinusitis   . Posttraumatic stress disorder   . Sleep apnea    Uses CPAP   Past Surgical History:  Procedure Laterality Date  . COLONOSCOPY    . FINGER SURGERY Right    index  . NASAL SINUS SURGERY     x 2  . POLYPECTOMY       No outpatient medications have been marked as taking for the 10/22/18 encounter (Appointment) with Minus Breeding, MD.     Allergies:   Patient has no known allergies.   Social History   Tobacco Use  . Smoking status: Former Smoker    Packs/day: 0.50    Years: 25.00    Pack years: 12.50    Types: Cigarettes    Last attempt to quit: 07/02/1983    Years since quitting: 35.3  . Smokeless tobacco: Never Used  Substance Use Topics  . Alcohol use: No  .  Drug use: No     Family Hx: The patient's family history includes Arthritis in his mother; Diabetes in his father. There is no history of Colon cancer, Colon polyps, Rectal cancer, or Stomach cancer.  ROS:   Please see the history of present illness.    *** All other systems reviewed and are negative.   Prior CV studies:   The following studies were reviewed today:  ***  Labs/Other Tests and Data Reviewed:    EKG:  {EKG/Telemetry Strips Reviewed:[phone]}  Recent Labs: 01/02/2018: ALT 7; BUN 10; Creatinine, Ser 1.09; Hemoglobin 11.7; Platelets 145.0; Potassium 4.0; Pro B Natriuretic peptide (BNP) 31.0; Sodium 138; TSH 3.23   Recent Lipid Panel Lab Results  Component Value Date/Time   CHOL 126 01/02/2018 09:54 AM   TRIG 50.0 01/02/2018 09:54 AM   HDL 46.30 01/02/2018 09:54 AM   CHOLHDL 3 01/02/2018 09:54 AM   LDLCALC 70 01/02/2018 09:54 AM    Wt  Readings from Last 3 Encounters:  07/30/18 270 lb 9.6 oz (122.7 kg)  04/30/18 274 lb (124.3 kg)  04/17/18 263 lb (119.3 kg)     Objective:    Vital Signs:  There were no vitals taken for this visit.   {[facility] Virtual Exam (Optional):[phone]::"VITAL SIGNS:  reviewed"}  ASSESSMENT & PLAN:    PALPITATIONS:    ***  Unfortunately had not been able to get him back to the treadmill which I wanted to do to look for chronotropic competence.  As previously stated he has had some bradycardia but I did not clearly see heart block.  I wanted to test to see if he developed symptoms on a treadmill but he did not present with this.  He does want to reschedule it at this point.  He is not had any further presyncopal episodes.  At this point he will call me back when he is able to schedule the treadmill test.  Of note this would need to be a modified Bruce most likely.   POSSIBLE PREVIOUS MI:   ***   No change in therapy.  I will follow-up as above.  MURMUR:   This was likely related to septal hypertrophy.  ***  He does consent to getting the echocardiogram.  I been unable to get records from the [facility].  SLEEP APNEA: He says he is wears CPAP***   but he is not on it right now because of his upper respiratory problems.  We did discuss the correlation between bradycardia and sleep apnea.  COVID-19 Education: The signs and symptoms of COVID-19 were discussed with the patient and how to seek care for testing (follow up with PCP or arrange E-visit).  ***The importance of social distancing was discussed today.  Time:   Today, I have spent *** minutes with the patient with telehealth technology discussing the above problems.     Medication Adjustments/Labs and Tests Ordered: Current medicines are reviewed at length with the patient today.  Concerns regarding medicines are outlined above.   Tests Ordered: No orders of the defined types were placed in this encounter.   Medication Changes: No orders  of the defined types were placed in this encounter.   Disposition:  Follow up {follow up:15908}  Signed, Minus Breeding, MD  10/21/2018 4:39 PM    [facility]

## 2018-10-22 ENCOUNTER — Telehealth: Payer: PPO | Admitting: Cardiology

## 2018-10-22 ENCOUNTER — Telehealth: Payer: Self-pay

## 2018-10-22 NOTE — Telephone Encounter (Signed)
I have called this patient two times to get his chart prepared for his virtual visit today. 10/22/18 I left a message for him to call the office back

## 2018-10-24 ENCOUNTER — Emergency Department (HOSPITAL_COMMUNITY)
Admission: EM | Admit: 2018-10-24 | Discharge: 2018-10-24 | Disposition: A | Discharge status: Home / Self Care | Payer: PPO | Attending: Emergency Medicine | Admitting: Emergency Medicine

## 2018-10-24 ENCOUNTER — Emergency Department (HOSPITAL_COMMUNITY): Payer: PPO

## 2018-10-24 ENCOUNTER — Other Ambulatory Visit: Payer: Self-pay

## 2018-10-24 ENCOUNTER — Encounter (HOSPITAL_COMMUNITY): Payer: Self-pay | Admitting: Emergency Medicine

## 2018-10-24 DIAGNOSIS — N401 Enlarged prostate with lower urinary tract symptoms: Secondary | ICD-10-CM | POA: Diagnosis not present

## 2018-10-24 DIAGNOSIS — N201 Calculus of ureter: Secondary | ICD-10-CM | POA: Insufficient documentation

## 2018-10-24 DIAGNOSIS — R319 Hematuria, unspecified: Secondary | ICD-10-CM | POA: Diagnosis not present

## 2018-10-24 DIAGNOSIS — J45909 Unspecified asthma, uncomplicated: Secondary | ICD-10-CM | POA: Diagnosis not present

## 2018-10-24 DIAGNOSIS — R1084 Generalized abdominal pain: Secondary | ICD-10-CM | POA: Diagnosis not present

## 2018-10-24 DIAGNOSIS — Z87891 Personal history of nicotine dependence: Secondary | ICD-10-CM | POA: Insufficient documentation

## 2018-10-24 DIAGNOSIS — R109 Unspecified abdominal pain: Secondary | ICD-10-CM | POA: Diagnosis present

## 2018-10-24 DIAGNOSIS — I10 Essential (primary) hypertension: Secondary | ICD-10-CM | POA: Diagnosis not present

## 2018-10-24 DIAGNOSIS — N132 Hydronephrosis with renal and ureteral calculous obstruction: Secondary | ICD-10-CM | POA: Diagnosis not present

## 2018-10-24 DIAGNOSIS — R52 Pain, unspecified: Secondary | ICD-10-CM | POA: Diagnosis not present

## 2018-10-24 DIAGNOSIS — Z79899 Other long term (current) drug therapy: Secondary | ICD-10-CM | POA: Insufficient documentation

## 2018-10-24 LAB — CBC WITH DIFFERENTIAL/PLATELET
Abs Immature Granulocytes: 0.02 10*3/uL (ref 0.00–0.07)
Basophils Absolute: 0 10*3/uL (ref 0.0–0.1)
Basophils Relative: 0 %
Eosinophils Absolute: 0.1 10*3/uL (ref 0.0–0.5)
Eosinophils Relative: 2 %
HCT: 39.2 % (ref 39.0–52.0)
Hemoglobin: 11.9 g/dL — ABNORMAL LOW (ref 13.0–17.0)
Immature Granulocytes: 0 %
Lymphocytes Relative: 17 %
Lymphs Abs: 1 10*3/uL (ref 0.7–4.0)
MCH: 19.5 pg — ABNORMAL LOW (ref 26.0–34.0)
MCHC: 30.4 g/dL (ref 30.0–36.0)
MCV: 64.4 fL — ABNORMAL LOW (ref 80.0–100.0)
Monocytes Absolute: 0.5 10*3/uL (ref 0.1–1.0)
Monocytes Relative: 9 %
Neutro Abs: 4.3 10*3/uL (ref 1.7–7.7)
Neutrophils Relative %: 72 %
Platelets: 148 10*3/uL — ABNORMAL LOW (ref 150–400)
RBC: 6.09 MIL/uL — ABNORMAL HIGH (ref 4.22–5.81)
RDW: 17.5 % — ABNORMAL HIGH (ref 11.5–15.5)
WBC: 6 10*3/uL (ref 4.0–10.5)
nRBC: 0 % (ref 0.0–0.2)

## 2018-10-24 LAB — URINALYSIS, ROUTINE W REFLEX MICROSCOPIC
Bilirubin Urine: NEGATIVE
Glucose, UA: NEGATIVE mg/dL
Ketones, ur: NEGATIVE mg/dL
Leukocytes,Ua: NEGATIVE
Nitrite: NEGATIVE
Protein, ur: NEGATIVE mg/dL
RBC / HPF: >50 RBC/hpf — ABNORMAL HIGH (ref 0–5)
Specific Gravity, Urine: 1.013 (ref 1.005–1.030)
pH: 6 (ref 5.0–8.0)

## 2018-10-24 LAB — LIPASE, BLOOD: Lipase: 22 U/L (ref 11–51)

## 2018-10-24 LAB — COMPREHENSIVE METABOLIC PANEL
ALT: 21 U/L (ref 0–44)
AST: 29 U/L (ref 15–41)
Albumin: 3.7 g/dL (ref 3.5–5.0)
Alkaline Phosphatase: 101 U/L (ref 38–126)
Anion gap: 7 (ref 5–15)
BUN: 12 mg/dL (ref 8–23)
CO2: 25 mmol/L (ref 22–32)
Calcium: 9.3 mg/dL (ref 8.9–10.3)
Chloride: 110 mmol/L (ref 98–111)
Creatinine, Ser: 1.26 mg/dL — ABNORMAL HIGH (ref 0.61–1.24)
GFR calc Af Amer: >60 mL/min (ref >60)
GFR calc non Af Amer: 56 mL/min — ABNORMAL LOW (ref >60)
Glucose, Bld: 121 mg/dL — ABNORMAL HIGH (ref 70–99)
Potassium: 4.1 mmol/L (ref 3.5–5.1)
Sodium: 142 mmol/L (ref 135–145)
Total Bilirubin: 0.7 mg/dL (ref 0.3–1.2)
Total Protein: 7.1 g/dL (ref 6.5–8.1)

## 2018-10-24 MED ORDER — HYDROCODONE-ACETAMINOPHEN 5-325 MG PO TABS: 1.0000 | ORAL_TABLET | ORAL | 0 refills | Status: DC | PRN

## 2018-10-24 MED ORDER — FENTANYL CITRATE (PF) 100 MCG/2ML IJ SOLN
50.0000 ug | Freq: Once | INTRAMUSCULAR | Status: AC
  Administered 2018-10-24: 50 ug via INTRAVENOUS
  Filled 2018-10-24: qty 2

## 2018-10-24 MED ORDER — TAMSULOSIN HCL 0.4 MG PO CAPS: 0.4000 mg | ORAL_CAPSULE | Freq: Every day | ORAL | 0 refills | Status: AC

## 2018-10-24 MED ORDER — SODIUM CHLORIDE 0.9 % IV BOLUS
500.0000 mL | Freq: Once | INTRAVENOUS | Status: AC
  Administered 2018-10-24: 500 mL via INTRAVENOUS

## 2018-10-24 NOTE — Discharge Instructions (Addendum)
Continue to drink water at home. Take Norco as needed as prescribed for pain.  -Take Colace if you develop constipation. Take Flomax daily as prescribed. Follow up with Urology  Use urine strainer

## 2018-10-24 NOTE — ED Triage Notes (Signed)
Pt from home called EMS d/t right flank pain and hematuria onset 2 - 3 hours ago pt given 100 mcg fentanyl that was effective

## 2018-10-24 NOTE — ED Notes (Signed)
Bed: BB83 Expected date:  Expected time:  Means of arrival:  Comments: EMS: abd pain Hematuria

## 2018-10-24 NOTE — ED Notes (Signed)
Patient given a strain and instruction to strain his urine.

## 2018-10-24 NOTE — ED Provider Notes (Signed)
[facility] DEPT Provider Note   CSN: [id number] Arrival date & time: 10/24/18  0630    History   Chief Complaint Chief Complaint  Patient presents with  . Flank Pain  . Hematuria    HPI Shane [name] is a 73 y.o. male.     73 year old male brought in by EMS from home for right flank pain.  Patient states pain started last night, woke up from his sleep, described as a dull ache, constant, does not radiate, nothing makes pain better or worse.  No relief with 3 antacids at home, did have relief with fentanyl from EMS but feels like this is wearing off at this time.  Patient also reports seeing blood in his urine since onset of symptoms this morning with dysuria, no known history of kidney stones, does report history of enlarged prostate.  Denies fevers, chills, nausea, vomiting, changes in bowel habits.  No other complaints or concerns.     Past Medical History:  Diagnosis Date  . Acute asthmatic bronchitis   . Anemia   . Anxiety disorder   . Borderline diabetes mellitus   . Borderline hypertension   . Degenerative joint disease   . Depression   . GERD (gastroesophageal reflux disease)   . History of adenomatous polyp of colon   . History of sinusitis   . Posttraumatic stress disorder   . Sleep apnea    Uses CPAP    Patient Active Problem List   Diagnosis Date Noted  . Bradycardia 07/30/2018  . Sleep apnea 07/30/2018  . Murmur 04/30/2018  . Dizziness 04/30/2018  . Palpitations 04/06/2016  . Elevated blood pressure reading without diagnosis of hypertension 01/12/2016  . Sinusitis, chronic 12/19/2015  . Pain in joint, upper arm 11/23/2015  . Numbness and tingling in left arm 11/23/2015  . Asthmatic bronchitis 02/21/2015  . PTSD (post-traumatic stress disorder) 02/21/2015  . Hemorrhoids 06/15/2014  . Anemia 03/31/2008  . GERD 03/31/2008  . Impaired fasting glucose 03/31/2008    Past Surgical History:  Procedure Laterality  Date  . COLONOSCOPY    . FINGER SURGERY Right    index  . NASAL SINUS SURGERY     x 2  . POLYPECTOMY          Home Medications    Prior to Admission medications   Medication Sig Start Date End Date Taking? Authorizing Provider  albuterol (PROAIR HFA) 108 (90 Base) MCG/ACT inhaler Inhale 2 puffs into the lungs every 6 (six) hours as needed for wheezing or shortness of breath. 08/18/18   Kozlow, Donnamarie Poag, MD  CVS VITAMIN B12 1000 MCG tablet TAKE 1 TABLET (1,000 MCG TOTAL) BY MOUTH DAILY. 04/15/17   [name], MD  esomeprazole (NEXIUM) 40 MG capsule Take 1 capsule (40 mg total) by mouth 2 (two) times daily before a meal. 08/18/18 11/16/18  Kozlow, Donnamarie Poag, MD  fluticasone furoate-vilanterol (BREO ELLIPTA) 200-25 MCG/INH AEPB Inhale 1 puff into the lungs daily. 08/18/18   Kozlow, Donnamarie Poag, MD  HYDROcodone-acetaminophen (NORCO/VICODIN) 5-325 MG tablet Take 1 tablet by mouth every 4 (four) hours as needed. 10/24/18   Tacy Learn, PA-C  ketoconazole (NIZORAL) 2 % shampoo Apply 1 application topically 2 (two) times a week.     [provider]  montelukast (SINGULAIR) 10 MG tablet Take 1 tablet (10 mg total) by mouth at bedtime. 08/18/18   Kozlow, Donnamarie Poag, MD  Propylene Glycol (SYSTANE BALANCE OP) Apply 1 drop to eye daily as  needed (dry eyes). Reported on 12/19/2015    [provider]  tamsulosin (FLOMAX) 0.4 MG CAPS capsule Take 1 capsule (0.4 mg total) by mouth daily for 7 days. 10/24/18 10/31/18  Tacy Learn, PA-C  triamcinolone (NASACORT ALLERGY 24HR) 55 MCG/ACT AERO nasal inhaler Use one spray in each nostril once daily as directed. 01/28/17   Kozlow, Donnamarie Poag, MD    Family History Family History  Problem Relation Age of Onset  . Diabetes Father   . Arthritis Mother        Unknown cause of death  . Colon cancer Neg Hx   . Colon polyps Neg Hx   . Rectal cancer Neg Hx   . Stomach cancer Neg Hx     Social History Social History   Tobacco Use  . Smoking status:  Former Smoker    Packs/day: 0.50    Years: 25.00    Pack years: 12.50    Types: Cigarettes    Last attempt to quit: 07/02/1983    Years since quitting: 35.3  . Smokeless tobacco: Never Used  Substance Use Topics  . Alcohol use: No  . Drug use: No     Allergies   Patient has no known allergies.   Review of Systems Review of Systems  Constitutional: Negative for chills and fever.  Respiratory: Negative for shortness of breath.   Cardiovascular: Negative for chest pain.  Gastrointestinal: Negative for constipation, diarrhea, nausea and vomiting.  Genitourinary: Positive for dysuria, flank pain and hematuria. Negative for difficulty urinating, frequency and urgency.  Musculoskeletal: Negative for arthralgias and myalgias.  Skin: Negative for rash and wound.  Allergic/Immunologic: Negative for immunocompromised state.  Neurological: Negative for weakness.  Hematological: Does not bruise/bleed easily.  Psychiatric/Behavioral: Negative for confusion.  All other systems reviewed and are negative.    Physical Exam Updated Vital Signs BP (!) 167/87 (BP Location: Left Arm)   Pulse 70   Temp 99.3 F (37.4 C) (Oral)   Resp 18   SpO2 100%   Physical Exam Vitals signs and nursing note reviewed.  Constitutional:      General: He is not in acute distress.    Appearance: He is well-developed. He is not diaphoretic.  HENT:     Head: Normocephalic and atraumatic.  Cardiovascular:     Rate and Rhythm: Normal rate and regular rhythm.     Pulses: Normal pulses.     Heart sounds: Normal heart sounds.  Pulmonary:     Effort: Pulmonary effort is normal.     Breath sounds: Normal breath sounds.  Abdominal:     General: There is no distension.     Palpations: Abdomen is soft.     Tenderness: There is no abdominal tenderness. There is right CVA tenderness. There is no left CVA tenderness.  Skin:    General: Skin is warm and dry.     Findings: No erythema or rash.  Neurological:      Mental Status: He is alert and oriented to person, place, and time.  Psychiatric:        Behavior: Behavior normal.      ED Treatments / Results  Labs (all labs ordered are listed, but only abnormal results are displayed) Labs Reviewed  COMPREHENSIVE METABOLIC PANEL - Abnormal; Notable for the following components:      Result Value   Glucose, Bld 121 (*)    Creatinine, Ser 1.26 (*)    GFR calc non Af Amer 56 (*)    All  other components within normal limits  CBC WITH DIFFERENTIAL/PLATELET - Abnormal; Notable for the following components:   RBC 6.09 (*)    Hemoglobin 11.9 (*)    MCV 64.4 (*)    MCH 19.5 (*)    RDW 17.5 (*)    Platelets 148 (*)    All other components within normal limits  URINALYSIS, ROUTINE W REFLEX MICROSCOPIC - Abnormal; Notable for the following components:   APPearance HAZY (*)    Hgb urine dipstick LARGE (*)    RBC / HPF >50 (*)    Bacteria, UA RARE (*)    All other components within normal limits  LIPASE, BLOOD    EKG None  Radiology Ct Renal Stone Study  Result Date: 10/24/2018 CLINICAL DATA:  RIGHT flank pain and hematuria. EXAM: CT ABDOMEN AND PELVIS WITHOUT CONTRAST TECHNIQUE: Multidetector CT imaging of the abdomen and pelvis was performed following the standard protocol without IV contrast. COMPARISON:  03/07/2017 FINDINGS: Lower chest: No acute abnormality. Hepatobiliary: No focal liver abnormality is seen. No gallstones, gallbladder wall thickening, or biliary dilatation. Pancreas: Unremarkable. No pancreatic ductal dilatation or surrounding inflammatory changes. Spleen: Normal in size without focal abnormality. Adrenals/Urinary Tract: Normal adrenals. RIGHT hydronephrosis and hydroureter. RIGHT perinephric stranding. LEFT kidney demonstrates a tiny calculus, mid collecting system, nonobstructing. 4 mm calculus at the UVJ, with mild bladder wall thickening. Stomach/Bowel: Stomach is within normal limits. Appendix appears normal. No evidence of  bowel wall thickening, distention, or inflammatory changes. Vascular/Lymphatic: Aortic atherosclerosis. No enlarged abdominal or pelvic lymph nodes. Reproductive: Prostate is enlarged. Other: Small fat containing abdominal wall hernia. Fat containing LEFT inguinal hernia. Musculoskeletal: No acute or significant osseous findings. IMPRESSION: 1. RIGHT hydronephrosis and hydroureter secondary to a 4 mm UVJ calculus. 2. LEFT nephrolithiasis. 3. Prostatomegaly. Aortic Atherosclerosis (ICD10-I70.0). Electronically Signed   By: Staci Righter M.D.   On: 10/24/2018 08:38    Procedures Procedures (including critical care time)  Medications Ordered in ED Medications  fentaNYL (SUBLIMAZE) injection 50 mcg (50 mcg Intravenous Given 10/24/18 0807)  sodium chloride 0.9 % bolus 500 mL (0 mLs Intravenous Stopped 10/24/18 0855)     Initial Impression / Assessment and Plan / ED Course  I have reviewed the triage vital signs and the nursing notes.  Pertinent labs & imaging results that were available during my care of the patient were reviewed by me and considered in my medical decision making (see chart for details).  Clinical Course as of Oct 24 1019  Sat Oct 24, 5255  136 73 year old male presents with complaint of right flank pain onset this morning with hematuria.  No history of prior kidney stones, reports history of BPH. On exam, patient has mild right CVA tenderness, otherwise well appearing. CT shows 1mm left UVJ stone; CBC with normal WBC, CMP with mild increase in Cr to 1.26, lipase normal, UA with large hgb no evidence of infection. Pain improved with Fentanyl, no vomitng, no fever, BP has improved. Discussed case with Dr. Venora Maples, ER attending, agrees with plan of care. Recommend Norco for pain, Flomax, follow up with Urology, referral given, advised to return to new or worsening symptoms. Patient verbalizes understanding of plan of care.    [LM]    Clinical Course User Index [LM] Tacy Learn,  PA-C      Final Clinical Impressions(s) / ED Diagnoses   Final diagnoses:  Ureterolithiasis    ED Discharge Orders         Ordered    HYDROcodone-acetaminophen (NORCO/VICODIN)  5-325 MG tablet  Every 4 hours PRN     10/24/18 0925    tamsulosin (FLOMAX) 0.4 MG CAPS capsule  Daily     10/24/18 0925           Tacy Learn, PA-C 10/24/18 1021    Jola Schmidt, MD 10/24/18 1034

## 2018-10-24 NOTE — ED Notes (Signed)
Patient transported to CT 

## 2018-12-14 ENCOUNTER — Other Ambulatory Visit: Payer: Self-pay | Admitting: Allergy and Immunology

## 2018-12-14 DIAGNOSIS — N202 Calculus of kidney with calculus of ureter: Secondary | ICD-10-CM | POA: Diagnosis not present

## 2018-12-22 ENCOUNTER — Encounter (HOSPITAL_BASED_OUTPATIENT_CLINIC_OR_DEPARTMENT_OTHER): Payer: Self-pay | Admitting: *Deleted

## 2018-12-22 ENCOUNTER — Emergency Department (HOSPITAL_BASED_OUTPATIENT_CLINIC_OR_DEPARTMENT_OTHER)
Admission: EM | Admit: 2018-12-22 | Discharge: 2018-12-22 | Disposition: A | Discharge status: Home / Self Care | Payer: PPO | Attending: Emergency Medicine | Admitting: Emergency Medicine

## 2018-12-22 ENCOUNTER — Emergency Department (HOSPITAL_BASED_OUTPATIENT_CLINIC_OR_DEPARTMENT_OTHER): Payer: PPO

## 2018-12-22 ENCOUNTER — Other Ambulatory Visit: Payer: Self-pay

## 2018-12-22 DIAGNOSIS — M79604 Pain in right leg: Secondary | ICD-10-CM | POA: Diagnosis not present

## 2018-12-22 DIAGNOSIS — Z79899 Other long term (current) drug therapy: Secondary | ICD-10-CM | POA: Diagnosis not present

## 2018-12-22 DIAGNOSIS — I821 Thrombophlebitis migrans: Secondary | ICD-10-CM | POA: Insufficient documentation

## 2018-12-22 DIAGNOSIS — Z87891 Personal history of nicotine dependence: Secondary | ICD-10-CM | POA: Diagnosis not present

## 2018-12-22 DIAGNOSIS — I82411 Acute embolism and thrombosis of right femoral vein: Secondary | ICD-10-CM | POA: Diagnosis not present

## 2018-12-22 DIAGNOSIS — I82401 Acute embolism and thrombosis of unspecified deep veins of right lower extremity: Secondary | ICD-10-CM | POA: Diagnosis not present

## 2018-12-22 MED ORDER — RIVAROXABAN (XARELTO) VTE STARTER PACK (15 & 20 MG): ORAL_TABLET | ORAL | 0 refills | Status: DC

## 2018-12-22 MED ORDER — ACETAMINOPHEN 325 MG PO TABS
650.0000 mg | ORAL_TABLET | Freq: Once | ORAL | Status: AC
  Administered 2018-12-22: 19:00:00 650 mg via ORAL
  Filled 2018-12-22: qty 2

## 2018-12-22 NOTE — ED Provider Notes (Signed)
[facility] Provider Note   CSN: [id number] Arrival date & time: 12/22/18  1809    History   Chief Complaint Chief Complaint  Patient presents with   Leg Pain    HPI Shane [name] is a 73 y.o. male.     HPI Patient presents with migratory redness, tenderness and "knots" that have been progressing up the inner surface of his right lower extremity starting in the calf and now at the mid thigh.  Denies any known injury.  No fever or chills.  No shortness of breath or chest pain.  Was seen in urgent care center and referred to the emergency department to rule out DVT. Past Medical History:  Diagnosis Date   Acute asthmatic bronchitis    Anemia    Anxiety disorder    Borderline diabetes mellitus    Borderline hypertension    Degenerative joint disease    Depression    GERD (gastroesophageal reflux disease)    History of adenomatous polyp of colon    History of sinusitis    Posttraumatic stress disorder    Sleep apnea    Uses CPAP    Patient Active Problem List   Diagnosis Date Noted   Bradycardia 07/30/2018   Sleep apnea 07/30/2018   Murmur 04/30/2018   Dizziness 04/30/2018   Palpitations 04/06/2016   Elevated blood pressure reading without diagnosis of hypertension 01/12/2016   Sinusitis, chronic 12/19/2015   Pain in joint, upper arm 11/23/2015   Numbness and tingling in left arm 11/23/2015   Asthmatic bronchitis 02/21/2015   PTSD (post-traumatic stress disorder) 02/21/2015   Hemorrhoids 06/15/2014   Anemia 03/31/2008   GERD 03/31/2008   Impaired fasting glucose 03/31/2008    Past Surgical History:  Procedure Laterality Date   COLONOSCOPY     FINGER SURGERY Right    index   NASAL SINUS SURGERY     x 2   POLYPECTOMY          Home Medications    Prior to Admission medications   Medication Sig Start Date End Date Taking? Authorizing Provider  albuterol (PROAIR HFA) 108 (90 Base)  MCG/ACT inhaler Inhale 2 puffs into the lungs every 6 (six) hours as needed for wheezing or shortness of breath. 08/18/18   Kozlow, Donnamarie Poag, MD  BREO ELLIPTA 200-25 MCG/INH AEPB TAKE 1 PUFF BY MOUTH EVERY DAY 12/14/18   Kozlow, Donnamarie Poag, MD  [facility] VITAMIN B12 1000 MCG tablet TAKE 1 TABLET (1,000 MCG TOTAL) BY MOUTH DAILY. 04/15/17   [name], MD  esomeprazole (NEXIUM) 40 MG capsule Take 1 capsule (40 mg total) by mouth 2 (two) times daily before a meal. 08/18/18 11/16/18  Kozlow, Donnamarie Poag, MD  HYDROcodone-acetaminophen (NORCO/VICODIN) 5-325 MG tablet Take 1 tablet by mouth every 4 (four) hours as needed. 10/24/18   Tacy Learn, PA-C  ketoconazole (NIZORAL) 2 % shampoo Apply 1 application topically 2 (two) times a week.     [provider]  montelukast (SINGULAIR) 10 MG tablet Take 1 tablet (10 mg total) by mouth at bedtime. 08/18/18   Kozlow, Donnamarie Poag, MD  Propylene Glycol (SYSTANE BALANCE OP) Apply 1 drop to eye daily as needed (dry eyes). Reported on 12/19/2015    [provider]  Rivaroxaban 15 & 20 MG TBPK Take as directed on package: Start with one 15mg  tablet by mouth twice a day with food. On Day 22, switch to one 20mg  tablet once a day with food. 12/22/18  Julianne Rice, MD  triamcinolone (NASACORT ALLERGY 24HR) 55 MCG/ACT AERO nasal inhaler Use one spray in each nostril once daily as directed. 01/28/17   Kozlow, Donnamarie Poag, MD    Family History Family History  Problem Relation Age of Onset   Diabetes Father    Arthritis Mother        Unknown cause of death   Colon cancer Neg Hx    Colon polyps Neg Hx    Rectal cancer Neg Hx    Stomach cancer Neg Hx     Social History Social History   Tobacco Use   Smoking status: Former Smoker    Packs/day: 0.50    Years: 25.00    Pack years: 12.50    Types: Cigarettes    Quit date: 07/02/1983    Years since quitting: 35.5   Smokeless tobacco: Never Used  Substance Use Topics   Alcohol use: No   Drug use: No      Allergies   Patient has no known allergies.   Review of Systems Review of Systems  Constitutional: Negative for chills and fever.  Respiratory: Negative for shortness of breath.   Cardiovascular: Positive for leg swelling. Negative for chest pain.  Musculoskeletal: Negative for back pain, myalgias and neck pain.  Skin: Positive for color change. Negative for rash and wound.  Neurological: Negative for weakness and numbness.  All other systems reviewed and are negative.    Physical Exam Updated Vital Signs BP (!) 170/86 (BP Location: Right Arm)    Pulse (!) 53    Temp 98.9 F (37.2 C) (Oral)    Resp 16    Ht 6\' 2"  (1.88 m)    Wt 114.8 kg    SpO2 99%    BMI 32.48 kg/m   Physical Exam Vitals signs and nursing note reviewed.  Constitutional:      Appearance: Normal appearance. He is well-developed.  HENT:     Head: Normocephalic and atraumatic.  Eyes:     Pupils: Pupils are equal, round, and reactive to light.  Neck:     Musculoskeletal: Normal range of motion and neck supple.  Cardiovascular:     Rate and Rhythm: Normal rate and regular rhythm.  Pulmonary:     Effort: Pulmonary effort is normal.     Breath sounds: Normal breath sounds.  Abdominal:     General: Bowel sounds are normal.     Palpations: Abdomen is soft.     Tenderness: There is no abdominal tenderness. There is no guarding or rebound.  Musculoskeletal: Normal range of motion.        General: Tenderness present.     Comments: Patient has streaking erythema extending from the medial surface of the right calf to the medial thigh.  Mild tenderness to palpation.  No calf swelling or asymmetry.  Distal pulses are 2+.  No knee swelling or joint effusion.  Skin:    General: Skin is warm and dry.     Findings: No erythema or rash.  Neurological:     General: No focal deficit present.     Mental Status: He is alert and oriented to person, place, and time.  Psychiatric:        Behavior: Behavior normal.       ED Treatments / Results  Labs (all labs ordered are listed, but only abnormal results are displayed) Labs Reviewed - No data to display  EKG None  Radiology US Venous Img Lower Unilateral Right  Result Date: 12/22/2018  CLINICAL DATA:  73 year old with medial right leg pain. EXAM: RIGHT LOWER EXTREMITY VENOUS DOPPLER ULTRASOUND TECHNIQUE: Gray-scale sonography with graded compression, as well as color Doppler and duplex ultrasound were performed to evaluate the lower extremity deep venous systems from the level of the common femoral vein and including the common femoral, femoral, profunda femoral, popliteal and calf veins including the posterior tibial, peroneal and gastrocnemius veins when visible. The superficial great saphenous vein was also interrogated. Spectral Doppler was utilized to evaluate flow at rest and with distal augmentation maneuvers in the common femoral, femoral and popliteal veins. COMPARISON:  None. FINDINGS: Contralateral Common Femoral Vein: Respiratory phasicity is normal and symmetric with the symptomatic side. No evidence of thrombus. Normal compressibility. Common Femoral Vein: No evidence of thrombus. Normal compressibility, respiratory phasicity and response to augmentation. Saphenofemoral Junction: No evidence of thrombus. Normal compressibility and flow on color Doppler imaging. Profunda Femoral Vein: No evidence of thrombus. Normal compressibility and flow on color Doppler imaging. Femoral Vein: Positive for thrombus. Nonocclusive thrombus in the mid right femoral vein. No thrombus in the proximal or distal femoral vein. Popliteal Vein: No evidence of thrombus. Normal compressibility, respiratory phasicity and response to augmentation. Calf Veins: No evidence of thrombus. Normal compressibility and flow on color Doppler imaging. Superficial Great Saphenous Vein: Positive for thrombus. Thrombus in the GSV at the right knee and right thigh. Great saphenous vein is  patent at the ankle. Venous Reflux:  None. Other Findings: There appears to be a perforator branch connecting the GSV with the mid femoral vein DVT. IMPRESSION: 1. Positive for deep venous thrombosis in the right lower extremity. Short segment of nonocclusive thrombus in the mid right femoral vein. This thrombus may be associated with a perforator branch that connects to the great saphenous vein. 2. Positive for superficial venous thrombosis in the right great saphenous vein. Thrombus in the right great saphenous vein involves the thigh and knee region. Electronically Signed   By: Markus Daft M.D.   On: 12/22/2018 20:23    Procedures Procedures (including critical care time)  Medications Ordered in ED Medications  acetaminophen (TYLENOL) tablet 650 mg (650 mg Oral Given 12/22/18 1852)     Initial Impression / Assessment and Plan / ED Course  I have reviewed the triage vital signs and the nursing notes.  Pertinent labs & imaging results that were available during my care of the patient were reviewed by me and considered in my medical decision making (see chart for details).        Physical exam consistent with migratory thrombophlebitis.  Will get ultrasound to rule out concurrent DVT. Ultrasound with evidence of right femoral DVT.  Started on Xarelto.  Will need follow-up with his primary physician.  Strict return precautions given. Final Clinical Impressions(s) / ED Diagnoses   Final diagnoses:  Acute deep vein thrombosis (DVT) of femoral vein of right lower extremity ([place])  Superficial migratory thrombophlebitis    ED Discharge Orders         Ordered    Rivaroxaban 15 & 20 MG TBPK     12/22/18 2035           Julianne Rice, MD 12/23/18 1520

## 2018-12-22 NOTE — ED Triage Notes (Signed)
Pt sent here form PMD for eval right thigh redness and pain x 3 days

## 2019-02-16 ENCOUNTER — Encounter (HOSPITAL_COMMUNITY): Payer: Self-pay | Admitting: Emergency Medicine

## 2019-02-16 ENCOUNTER — Telehealth: Payer: Self-pay | Admitting: *Deleted

## 2019-02-16 ENCOUNTER — Ambulatory Visit: Payer: PPO | Admitting: Allergy and Immunology

## 2019-02-16 ENCOUNTER — Other Ambulatory Visit: Payer: Self-pay

## 2019-02-16 ENCOUNTER — Emergency Department (HOSPITAL_BASED_OUTPATIENT_CLINIC_OR_DEPARTMENT_OTHER): Payer: PPO

## 2019-02-16 ENCOUNTER — Emergency Department (HOSPITAL_COMMUNITY)
Admission: EM | Admit: 2019-02-16 | Discharge: 2019-02-16 | Disposition: A | Discharge status: Home / Self Care | Payer: PPO | Attending: Emergency Medicine | Admitting: Emergency Medicine

## 2019-02-16 ENCOUNTER — Emergency Department (HOSPITAL_COMMUNITY): Payer: PPO

## 2019-02-16 DIAGNOSIS — I10 Essential (primary) hypertension: Secondary | ICD-10-CM | POA: Diagnosis not present

## 2019-02-16 DIAGNOSIS — Z79899 Other long term (current) drug therapy: Secondary | ICD-10-CM | POA: Diagnosis not present

## 2019-02-16 DIAGNOSIS — R0602 Shortness of breath: Secondary | ICD-10-CM | POA: Insufficient documentation

## 2019-02-16 DIAGNOSIS — Z87891 Personal history of nicotine dependence: Secondary | ICD-10-CM | POA: Insufficient documentation

## 2019-02-16 DIAGNOSIS — S91001A Unspecified open wound, right ankle, initial encounter: Secondary | ICD-10-CM | POA: Diagnosis not present

## 2019-02-16 DIAGNOSIS — R2243 Localized swelling, mass and lump, lower limb, bilateral: Secondary | ICD-10-CM | POA: Diagnosis present

## 2019-02-16 DIAGNOSIS — J45909 Unspecified asthma, uncomplicated: Secondary | ICD-10-CM | POA: Insufficient documentation

## 2019-02-16 DIAGNOSIS — I824Y3 Acute embolism and thrombosis of unspecified deep veins of proximal lower extremity, bilateral: Secondary | ICD-10-CM

## 2019-02-16 DIAGNOSIS — M7989 Other specified soft tissue disorders: Secondary | ICD-10-CM

## 2019-02-16 LAB — CBC WITH DIFFERENTIAL/PLATELET
Abs Immature Granulocytes: 0.02 10*3/uL (ref 0.00–0.07)
Basophils Absolute: 0 10*3/uL (ref 0.0–0.1)
Basophils Relative: 0 %
Eosinophils Absolute: 0.3 10*3/uL (ref 0.0–0.5)
Eosinophils Relative: 6 %
HCT: 33 % — ABNORMAL LOW (ref 39.0–52.0)
Hemoglobin: 9.8 g/dL — ABNORMAL LOW (ref 13.0–17.0)
Immature Granulocytes: 0 %
Lymphocytes Relative: 28 %
Lymphs Abs: 1.5 10*3/uL (ref 0.7–4.0)
MCH: 18.4 pg — ABNORMAL LOW (ref 26.0–34.0)
MCHC: 29.7 g/dL — ABNORMAL LOW (ref 30.0–36.0)
MCV: 61.9 fL — ABNORMAL LOW (ref 80.0–100.0)
Monocytes Absolute: 0.6 10*3/uL (ref 0.1–1.0)
Monocytes Relative: 11 %
Neutro Abs: 3 10*3/uL (ref 1.7–7.7)
Neutrophils Relative %: 55 %
Platelets: 177 10*3/uL (ref 150–400)
RBC: 5.33 MIL/uL (ref 4.22–5.81)
RDW: 17.4 % — ABNORMAL HIGH (ref 11.5–15.5)
WBC: 5.4 10*3/uL (ref 4.0–10.5)
nRBC: 0 % (ref 0.0–0.2)

## 2019-02-16 LAB — COMPREHENSIVE METABOLIC PANEL
ALT: 13 U/L (ref 0–44)
AST: 24 U/L (ref 15–41)
Albumin: 3.3 g/dL — ABNORMAL LOW (ref 3.5–5.0)
Alkaline Phosphatase: 96 U/L (ref 38–126)
Anion gap: 13 (ref 5–15)
BUN: 9 mg/dL (ref 8–23)
CO2: 22 mmol/L (ref 22–32)
Calcium: 8.7 mg/dL — ABNORMAL LOW (ref 8.9–10.3)
Chloride: 104 mmol/L (ref 98–111)
Creatinine, Ser: 1.09 mg/dL (ref 0.61–1.24)
GFR calc Af Amer: >60 mL/min (ref >60)
GFR calc non Af Amer: >60 mL/min (ref >60)
Glucose, Bld: 86 mg/dL (ref 70–99)
Potassium: 3.4 mmol/L — ABNORMAL LOW (ref 3.5–5.1)
Sodium: 139 mmol/L (ref 135–145)
Total Bilirubin: 0.9 mg/dL (ref 0.3–1.2)
Total Protein: 6.8 g/dL (ref 6.5–8.1)

## 2019-02-16 LAB — BRAIN NATRIURETIC PEPTIDE: B Natriuretic Peptide: 44.9 pg/mL (ref 0.0–100.0)

## 2019-02-16 NOTE — Progress Notes (Signed)
Bilateral lower extremity venous duplex has been completed. Preliminary results can be found in CV Proc through chart review.  Results were given to Dr. Maryan Rued.  02/16/19 8:57 AM Carlos Levering RVT

## 2019-02-16 NOTE — ED Notes (Signed)
Vascular at bedside

## 2019-02-16 NOTE — ED Notes (Signed)
Pt ambulatory to bathroom

## 2019-02-16 NOTE — Discharge Instructions (Signed)
As often as you can you need to elevate your legs up on pillows to help with the swelling.  They are making you an appointment with wound care so you can go and they can monitor the wounds.  Also vascular surgery is going to call for you to make an appointment in the next few weeks and you have an appointment with Dr. Sharlet Salina on Monday, August 24 at 220 in the afternoon.  Continue to take your blood thinner like you are doing

## 2019-02-16 NOTE — Telephone Encounter (Signed)
-----   Message from Angelia Mould, MD sent at 02/16/2019  3:17 PM EDT ----- Regarding: office visit This patient was in the [facility] long emergency department with a DVT.  This clot has been present since June so he was not a candidate for thrombolysis.  I did not see him in consult.  I did explain that I would arrange for a follow-up visit in 4 to 6 weeks.  He does not need a venous duplex.  Thank you. CD

## 2019-02-16 NOTE — ED Provider Notes (Signed)
[facility] DEPT Provider Note   CSN: [id number] Arrival date & time: 02/16/19  7425     History   Chief Complaint Chief Complaint  Patient presents with   Wound Infection    HPI Shane [name] is a 73 y.o. male.     Patient is a 73 year old male with a history of asthma, anemia, bradycardia, hypertension and recent diagnosis of femoral DVT in June who presents today with complaint of leg swelling and wound.  Patient states that today he noticed his legs were very swollen and he noticed a smell coming from his right leg.  He then noticed some bleeding and oozing from the area.  He is not sure how long this is been going on but today was much worse than anything he recalls in the past.  Patient states on 12/22/2018 he was seen in the emergency department and diagnosed with a blood clot.  At that time he was prescribed Xarelto but because of the side effect profile he was scared to take the medication and only recently started it within the last 2 weeks.  He states the right leg has been swollen but today he also noticed that his left leg was swollen and is not sure why.  He occasionally has shortness of breath and occasionally has chest pain but states it is not all the time and is not every time he walks.  He does admit to having a history of asthma and figured that was the cause but also was in the process of getting a work-up with cardiology when [place] started and has not completed his work-up there.  Patient has not made plans to follow-up with his regular doctor yet because he said he just recently started the blood thinner and thought he needed to wait.  He denies any fever, productive cough, abdominal pain, nausea or vomiting.  His diet has been the same and he has no anorexia or weight loss.  The history is provided by the patient.    Past Medical History:  Diagnosis Date   Acute asthmatic bronchitis    Anemia    Anxiety disorder     Borderline diabetes mellitus    Borderline hypertension    Degenerative joint disease    Depression    GERD (gastroesophageal reflux disease)    History of adenomatous polyp of colon    History of sinusitis    Posttraumatic stress disorder    Sleep apnea    Uses CPAP    Patient Active Problem List   Diagnosis Date Noted   Bradycardia 07/30/2018   Sleep apnea 07/30/2018   Murmur 04/30/2018   Dizziness 04/30/2018   Palpitations 04/06/2016   Elevated blood pressure reading without diagnosis of hypertension 01/12/2016   Sinusitis, chronic 12/19/2015   Pain in joint, upper arm 11/23/2015   Numbness and tingling in left arm 11/23/2015   Asthmatic bronchitis 02/21/2015   PTSD (post-traumatic stress disorder) 02/21/2015   Hemorrhoids 06/15/2014   Anemia 03/31/2008   GERD 03/31/2008   Impaired fasting glucose 03/31/2008    Past Surgical History:  Procedure Laterality Date   COLONOSCOPY     FINGER SURGERY Right    index   NASAL SINUS SURGERY     x 2   POLYPECTOMY          Home Medications    Prior to Admission medications   Medication Sig Start Date End Date Taking? Authorizing Provider  albuterol (PROAIR HFA) 108 (90 Base) MCG/ACT inhaler  Inhale 2 puffs into the lungs every 6 (six) hours as needed for wheezing or shortness of breath. 08/18/18   Kozlow, Donnamarie Poag, MD  BREO ELLIPTA 200-25 MCG/INH AEPB TAKE 1 PUFF BY MOUTH EVERY DAY 12/14/18   Kozlow, Donnamarie Poag, MD  [facility] VITAMIN B12 1000 MCG tablet TAKE 1 TABLET (1,000 MCG TOTAL) BY MOUTH DAILY. 04/15/17   [name], MD  esomeprazole (NEXIUM) 40 MG capsule Take 1 capsule (40 mg total) by mouth 2 (two) times daily before a meal. 08/18/18 11/16/18  Kozlow, Donnamarie Poag, MD  HYDROcodone-acetaminophen (NORCO/VICODIN) 5-325 MG tablet Take 1 tablet by mouth every 4 (four) hours as needed. 10/24/18   Tacy Learn, PA-C  ketoconazole (NIZORAL) 2 % shampoo Apply 1 application topically 2 (two) times a week.      [provider]  montelukast (SINGULAIR) 10 MG tablet Take 1 tablet (10 mg total) by mouth at bedtime. 08/18/18   Kozlow, Donnamarie Poag, MD  Propylene Glycol (SYSTANE BALANCE OP) Apply 1 drop to eye daily as needed (dry eyes). Reported on 12/19/2015    [provider]  Rivaroxaban 15 & 20 MG TBPK Take as directed on package: Start with one 15mg  tablet by mouth twice a day with food. On Day 22, switch to one 20mg  tablet once a day with food. 12/22/18   Julianne Rice, MD  triamcinolone (NASACORT ALLERGY 24HR) 55 MCG/ACT AERO nasal inhaler Use one spray in each nostril once daily as directed. 01/28/17   Kozlow, Donnamarie Poag, MD    Family History Family History  Problem Relation Age of Onset   Diabetes Father    Arthritis Mother        Unknown cause of death   Colon cancer Neg Hx    Colon polyps Neg Hx    Rectal cancer Neg Hx    Stomach cancer Neg Hx     Social History Social History   Tobacco Use   Smoking status: Former Smoker    Packs/day: 0.50    Years: 25.00    Pack years: 12.50    Types: Cigarettes    Quit date: 07/02/1983    Years since quitting: 35.6   Smokeless tobacco: Never Used  Substance Use Topics   Alcohol use: No   Drug use: No     Allergies   Patient has no known allergies.   Review of Systems Review of Systems  All other systems reviewed and are negative.    Physical Exam Updated Vital Signs BP (!) 173/96 (BP Location: Left Arm)    Pulse (!) 59    Temp 98 F (36.7 C) (Oral)    Resp 18    SpO2 100%   Physical Exam Vitals signs and nursing note reviewed.  Constitutional:      General: He is not in acute distress.    Appearance: He is well-developed. He is obese.  HENT:     Head: Normocephalic and atraumatic.     Nose: Nose normal.     Mouth/Throat:     Mouth: Mucous membranes are moist.  Eyes:     Conjunctiva/sclera: Conjunctivae normal.     Pupils: Pupils are equal, round, and reactive to light.  Neck:     Musculoskeletal:  Normal range of motion and neck supple.  Cardiovascular:     Rate and Rhythm: Normal rate and regular rhythm.     Pulses: Normal pulses.     Heart sounds: No murmur.  Pulmonary:     Effort: Pulmonary  effort is normal. No respiratory distress.     Breath sounds: Normal breath sounds. No wheezing, rhonchi or rales.  Chest:     Chest wall: No tenderness.  Abdominal:     General: There is no distension.     Palpations: Abdomen is soft.     Tenderness: There is no abdominal tenderness. There is no guarding or rebound.  Musculoskeletal: Normal range of motion.        General: No tenderness.     Right lower leg: Edema present.     Left lower leg: Edema present.       Legs:       Feet:     Comments: Bilateral lower extremity edema right greater than left.  Right leg from the groin down is double the size of the left leg  Skin:    General: Skin is warm and dry.     Findings: No erythema or rash.  Neurological:     General: No focal deficit present.     Mental Status: He is alert and oriented to person, place, and time. Mental status is at baseline.  Psychiatric:        Mood and Affect: Mood normal.        Behavior: Behavior normal.        Thought Content: Thought content normal.      ED Treatments / Results  Labs (all labs ordered are listed, but only abnormal results are displayed) Labs Reviewed  CBC WITH DIFFERENTIAL/PLATELET - Abnormal; Notable for the following components:      Result Value   Hemoglobin 9.8 (*)    HCT 33.0 (*)    MCV 61.9 (*)    MCH 18.4 (*)    MCHC 29.7 (*)    RDW 17.4 (*)    All other components within normal limits  COMPREHENSIVE METABOLIC PANEL - Abnormal; Notable for the following components:   Potassium 3.4 (*)    Calcium 8.7 (*)    Albumin 3.3 (*)    All other components within normal limits  BRAIN NATRIURETIC PEPTIDE    EKG EKG Interpretation  Date/Time:  Tuesday February 16 2019 08:13:07 EDT Ventricular Rate:  59 PR Interval:    QRS  Duration: 91 QT Interval:  470 QTC Calculation: 466 R Axis:   27 Text Interpretation:  Sinus rhythm Baseline wander in lead(s) V6 No significant change since last tracing Confirmed by Blanchie Dessert ([id number]) on 02/16/2019 8:22:21 AM   Radiology Dg Chest Port 1 View  Result Date: 02/16/2019 CLINICAL DATA:  Patient with right ankle wound. EXAM: PORTABLE CHEST 1 VIEW COMPARISON:  Chest radiograph 11/23/2015 FINDINGS: Monitoring leads overlie the patient. Stable cardiac and mediastinal contours. No large area pulmonary consolidation. No pleural effusion or pneumothorax. Thoracic spine degenerative changes. IMPRESSION: No acute cardiopulmonary process. Electronically Signed   By: Lovey Newcomer M.D.   On: 02/16/2019 08:30   Vas [place] Lower Extremity Venous (dvt) (mc And Wl 7a-7p)  Result Date: 02/16/2019  Lower Venous Study Indications: Swelling.  Risk Factors: None identified. Limitations: Poor ultrasound/tissue interface and Bowel gas. Comparison Study: 12/22/18 - Right FV DVT Performing Technologist: Oliver Hum RVT  Examination Guidelines: A complete evaluation includes B-mode imaging, spectral Doppler, color Doppler, and power Doppler as needed of all accessible portions of each vessel. Bilateral testing is considered an integral part of a complete examination. Limited examinations for reoccurring indications may be performed as noted.  +---------+---------------+---------+-----------+----------+-------+  RIGHT     Compressibility Phasicity Spontaneity Properties Summary  +---------+---------------+---------+-----------+----------+-------+  CFV       Partial         Yes       Yes                    Acute    +---------+---------------+---------+-----------+----------+-------+  SFJ       Full                                                      +---------+---------------+---------+-----------+----------+-------+  FV Prox   None                                             Acute     +---------+---------------+---------+-----------+----------+-------+  FV Mid    None                                             Acute    +---------+---------------+---------+-----------+----------+-------+  FV Distal None            No        No                     Acute    +---------+---------------+---------+-----------+----------+-------+  PFV       Full                                                      +---------+---------------+---------+-----------+----------+-------+  POP       None            No        No                     Acute    +---------+---------------+---------+-----------+----------+-------+  PTV       None                                             Acute    +---------+---------------+---------+-----------+----------+-------+  PERO      None                                             Acute    +---------+---------------+---------+-----------+----------+-------+  Gastroc   Partial                                          Acute    +---------+---------------+---------+-----------+----------+-------+  EIV       Full                                                      +---------+---------------+---------+-----------+----------+-------+  External iliac vein appears patent and compressible. Unable to visualize the common iliac vein due to bowel gas.  +---------+---------------+---------+-----------+----------+--------------+  LEFT      Compressibility Phasicity Spontaneity Properties Summary         +---------+---------------+---------+-----------+----------+--------------+  CFV       Full            Yes       Yes                                    +---------+---------------+---------+-----------+----------+--------------+  SFJ       Full                                                             +---------+---------------+---------+-----------+----------+--------------+  FV Prox   Full                                                              +---------+---------------+---------+-----------+----------+--------------+  FV Mid    Full                                                             +---------+---------------+---------+-----------+----------+--------------+  FV Distal Full                                                             +---------+---------------+---------+-----------+----------+--------------+  PFV       Full                                                             +---------+---------------+---------+-----------+----------+--------------+  POP       Partial         No        No                     Acute           +---------+---------------+---------+-----------+----------+--------------+  PTV       Partial                                          Acute           +---------+---------------+---------+-----------+----------+--------------+  PERO  Not visualized  +---------+---------------+---------+-----------+----------+--------------+     Summary: Right: Findings consistent with acute deep vein thrombosis involving the right common femoral vein, right femoral vein, right popliteal vein, right posterior tibial veins, right peroneal veins, and right gastrocnemius veins. No cystic structure found in the popliteal fossa. Left: Findings consistent with acute deep vein thrombosis involving the left popliteal vein, and left posterior tibial veins. No cystic structure found in the popliteal fossa.  *See table(s) above for measurements and observations.    Preliminary     Procedures Procedures (including critical care time)  Medications Ordered in ED Medications - No data to display   Initial Impression / Assessment and Plan / ED Course  I have reviewed the triage vital signs and the nursing notes.  Pertinent labs & imaging results that were available during my care of the patient were reviewed by me and considered in my medical decision making (see chart for details).         Elderly male presenting today with persistent and worsening leg swelling.  Patient has significant swelling of the right leg but he also complains of left leg swelling to which is unclear how long this is been going on.  Patient's right leg swelling is most likely related to a DVT that was discovered in June but he only recently started Xarelto 2 weeks ago.  The wounds on his legs look like a result of skin trauma from a poorly fitted shoe due to the swelling and there is some mild leg weeping.  No evidence of cellulitis at this time and patient's pulses are palpable bilaterally.  However he is also complaining of swelling in the left leg unclear the length of time this is been going on as well as occasional shortness of breath and chest pain which he cannot give specific details about.  Patient was undergoing work-up with cardiology and was scheduled for a stress test but states since COVID has not been able to complete his evaluation.  No prior history of CHF.  Patient is in no acute distress today and feel that he will need a compression sock or wrap of the right lower extremity and wound care.  However given complaints of bilateral lower leg edema will repeat Dopplers to ensure patient does not have clot on the left leg as well but also will ensure no evidence of renal insufficiency or heart failure.  9:48 AM EKG within normal limits, CBC with anemia which appears to be chronic for this patient and CMP without acute findings with normal renal function.  Patient's Doppler shows complete occlusion of the right venous system from femoral all the way down.  Also left popliteal and posterior tibial DVT which is most likely the cause of the swelling in his left leg. Discussed case with Vascular Dr. Scot Dock who recommended that pt needs to elevated the legs significantly to help with the swelling and continue xarelto.  Pt is not a candidate for thrombolysis but will need vascular f/u in the office.  Spoke  with Dr. Nathanial Millman office and left a message with her nurse but she is out of the office until Monday.  Final Clinical Impressions(s) / ED Diagnoses   Final diagnoses:  None    ED Discharge Orders    None       Blanchie Dessert, MD 02/18/19 1513

## 2019-02-16 NOTE — ED Notes (Signed)
ED Provider at bedside. 

## 2019-02-16 NOTE — ED Notes (Signed)
Case management at bedside.

## 2019-02-16 NOTE — TOC Initial Note (Signed)
Transition of Care [facility]) - Initial/Assessment Note    Patient Details  Name: Shane [name] MRN: [id number] Date of Birth: October 25, 1945  [facility] CM/SW Contact:    Erenest Rasher, RN Phone Number: [phone] 02/16/2019, 1:08 PM  Clinical Narrative:                 Spoke to pt and declines HH. Pt states he drives to his appts. He is tied into the [place]. Provided pt with information with Medical Supply stores in [place] to pick up compression hose, cane and wound care supplies.   Expected Discharge Plan: [place] Barriers to Discharge: No Barriers Identified   Patient Goals and CMS Choice Patient states their goals for this hospitalization and ongoing recovery are:: wants to go to outpatient wound care clinic CMS Medicare.gov Compare Post Acute Care list provided to:: Patient Choice offered to / list presented to : Patient  Expected Discharge Plan and Services Expected Discharge Plan: [place]   Discharge Planning Services: CM Consult Post Acute Care Choice: [place] arrangements for the past 2 months: Single Family Home Expected Discharge Date: (unknown)                         HH Arranged: Refused HH          Prior Living Arrangements/Services Living arrangements for the past 2 months: Single Family Home Lives with:: Self Patient language and need for interpreter reviewed:: Yes Do you feel safe going back to the place where you live?: Yes      Need for Family Participation in Patient Care: No (Comment) Care giver support system in place?: No (comment)   Criminal Activity/Legal Involvement Pertinent to Current Situation/Hospitalization: No - Comment as needed  Activities of Daily Living Home Assistive Devices/Equipment: Eyeglasses, CPAP ADL Screening (condition at time of admission) Patient's cognitive ability adequate to safely complete daily activities?: Yes Is the patient deaf or have  difficulty hearing?: No Does the patient have difficulty seeing, even when wearing glasses/contacts?: No Does the patient have difficulty concentrating, remembering, or making decisions?: No Patient able to express need for assistance with ADLs?: Yes Does the patient have difficulty dressing or bathing?: No Independently performs ADLs?: Yes (appropriate for developmental age) Does the patient have difficulty walking or climbing stairs?: Yes(secondary to right ankle wound) Weakness of Legs: Right Weakness of Arms/Hands: None  Permission Sought/Granted Permission sought to share information with : Case Manager, PCP, Family Supports Permission granted to share information with : Yes, Verbal Permission Granted     Permission granted to share info w AGENCY: Wound Care Clinic        Emotional Assessment Appearance:: Appears stated age Attitude/Demeanor/Rapport: Gracious Affect (typically observed): Accepting Orientation: : Oriented to Self, Oriented to Place, Oriented to  Time, Oriented to Situation   Psych Involvement: No (comment)  Admission diagnosis:  wound check/ ankle swelling  Patient Active Problem List   Diagnosis Date Noted  . Bradycardia 07/30/2018  . Sleep apnea 07/30/2018  . Murmur 04/30/2018  . Dizziness 04/30/2018  . Palpitations 04/06/2016  . Elevated blood pressure reading without diagnosis of hypertension 01/12/2016  . Sinusitis, chronic 12/19/2015  . Pain in joint, upper arm 11/23/2015  . Numbness and tingling in left arm 11/23/2015  . Asthmatic bronchitis 02/21/2015  . PTSD (post-traumatic stress disorder) 02/21/2015  . Hemorrhoids 06/15/2014  . Anemia 03/31/2008  . GERD 03/31/2008  .  Impaired fasting glucose 03/31/2008   PCP:  [name], MD Pharmacy:   [id number] [place], [place] [address] [place] [address] Phone: [phone] Fax:  [phone]     Social Determinants of Health (SDOH) Interventions    Readmission Risk Interventions No flowsheet data found.

## 2019-02-16 NOTE — ED Triage Notes (Signed)
Pt states that for several weeks he has had a malodorous wound on his R ankle at the site of a previous wound. Alert and oriented.

## 2019-02-22 ENCOUNTER — Ambulatory Visit: Payer: PPO | Admitting: Internal Medicine

## 2019-02-22 DIAGNOSIS — Z0289 Encounter for other administrative examinations: Secondary | ICD-10-CM

## 2019-02-26 ENCOUNTER — Encounter (HOSPITAL_BASED_OUTPATIENT_CLINIC_OR_DEPARTMENT_OTHER): Payer: PPO | Attending: Internal Medicine

## 2019-03-01 ENCOUNTER — Other Ambulatory Visit (INDEPENDENT_AMBULATORY_CARE_PROVIDER_SITE_OTHER): Payer: PPO

## 2019-03-01 ENCOUNTER — Ambulatory Visit (INDEPENDENT_AMBULATORY_CARE_PROVIDER_SITE_OTHER): Payer: PPO | Admitting: Internal Medicine

## 2019-03-01 ENCOUNTER — Other Ambulatory Visit: Payer: Self-pay

## 2019-03-01 ENCOUNTER — Encounter: Payer: Self-pay | Admitting: Internal Medicine

## 2019-03-01 VITALS — BP 130/82 | HR 64 | Temp 98.6°F | Ht 74.0 in | Wt 258.0 lb

## 2019-03-01 DIAGNOSIS — I824Y3 Acute embolism and thrombosis of unspecified deep veins of proximal lower extremity, bilateral: Secondary | ICD-10-CM | POA: Diagnosis not present

## 2019-03-01 DIAGNOSIS — Z23 Encounter for immunization: Secondary | ICD-10-CM

## 2019-03-01 DIAGNOSIS — I82411 Acute embolism and thrombosis of right femoral vein: Secondary | ICD-10-CM | POA: Insufficient documentation

## 2019-03-01 LAB — VITAMIN B12: Vitamin B-12: 409 pg/mL (ref 211–911)

## 2019-03-01 LAB — LIPID PANEL
Cholesterol: 144 mg/dL (ref 0–200)
HDL: 51.5 mg/dL (ref >39.00)
LDL Cholesterol: 80 mg/dL (ref 0–99)
NonHDL: 92.99
Total CHOL/HDL Ratio: 3
Triglycerides: 65 mg/dL (ref 0.0–149.0)
VLDL: 13 mg/dL (ref 0.0–40.0)

## 2019-03-01 LAB — COMPREHENSIVE METABOLIC PANEL
ALT: 16 U/L (ref 0–53)
AST: 31 U/L (ref 0–37)
Albumin: 3.7 g/dL (ref 3.5–5.2)
Alkaline Phosphatase: 109 U/L (ref 39–117)
BUN: 13 mg/dL (ref 6–23)
CO2: 26 mEq/L (ref 19–32)
Calcium: 8.6 mg/dL (ref 8.4–10.5)
Chloride: 105 mEq/L (ref 96–112)
Creatinine, Ser: 1.04 mg/dL (ref 0.40–1.50)
GFR: 84.35 mL/min (ref >60.00)
Glucose, Bld: 96 mg/dL (ref 70–99)
Potassium: 3.4 mEq/L — ABNORMAL LOW (ref 3.5–5.1)
Sodium: 140 mEq/L (ref 135–145)
Total Bilirubin: 0.7 mg/dL (ref 0.2–1.2)
Total Protein: 6.5 g/dL (ref 6.0–8.3)

## 2019-03-01 LAB — CBC WITH DIFFERENTIAL/PLATELET
Basophils Relative: 0.6 % (ref 0.0–3.0)
Eosinophils Relative: 4.9 % (ref 0.0–5.0)
HCT: 31 % — ABNORMAL LOW (ref 39.0–52.0)
Hemoglobin: 9.7 g/dL — ABNORMAL LOW (ref 13.0–17.0)
Lymphocytes Relative: 41.5 % (ref 12.0–46.0)
MCHC: 31.4 g/dL (ref 30.0–36.0)
MCV: 60.3 fl — ABNORMAL LOW (ref 78.0–100.0)
Monocytes Relative: 8.9 % (ref 3.0–12.0)
Neutrophils Relative %: 44.1 % (ref 43.0–77.0)
Platelets: 155 10*3/uL (ref 150.0–400.0)
RBC: 5.14 Mil/uL (ref 4.22–5.81)
RDW: 18.3 % — ABNORMAL HIGH (ref 11.5–15.5)
WBC: 5.2 10*3/uL (ref 4.0–10.5)

## 2019-03-01 LAB — HEMOGLOBIN A1C: Hgb A1c MFr Bld: 6.2 % (ref 4.6–6.5)

## 2019-03-01 LAB — PSA: PSA: 1.3 ng/mL (ref 0.10–4.00)

## 2019-03-01 LAB — FERRITIN: Ferritin: 168.4 ng/mL (ref 22.0–322.0)

## 2019-03-01 MED ORDER — RIVAROXABAN 20 MG PO TABS: 20.0000 mg | ORAL_TABLET | Freq: Every day | ORAL | 1 refills | Status: DC

## 2019-03-01 NOTE — Assessment & Plan Note (Signed)
Concerning given lack of cause with weight loss 10 pounds. Given agent orange exposure checking PSA. Checking hypercoagulable panel. Needs minimum xarelto 6 months and maybe longer depending on if cause established. Also concerning is his LAD on prior CT 2018 abdomen. May need imaging especially if he has continued weight loss. Colonoscopy 2018 with polyps and anemia thought to be related to GI loss which was worse on recent labs. Checking iron, B12, CBC with diff to rule out leukemia (agent orange exposure). Will need close follow up as he is high risk to be lost to follow up.

## 2019-03-01 NOTE — Patient Instructions (Addendum)
We have sent in a refill of the xarelto to continue taking 1 pill daily for at least 6 months. We are running some blood work today to check for a cause of the clot.   Come back in 3 months to check in.   Deep Vein Thrombosis  Deep vein thrombosis (DVT) is a condition in which a blood clot forms in a deep vein, such as a lower leg, thigh, or arm vein. A clot is blood that has thickened into a gel or solid. This condition is dangerous. It can lead to serious and even life-threatening complications if the clot travels to the lungs and causes a blockage (pulmonary embolism). It can also damage veins in the leg. This can result in leg pain, swelling, discoloration, and sores (post-thrombotic syndrome). What are the causes? This condition may be caused by:  A slowdown of blood flow.  Damage to a vein.  A condition that causes blood to clot more easily, such as an inherited clotting disorder. What increases the risk? The following factors may make you more likely to develop this condition:  Being overweight.  Being older, especially over age 73.  Sitting or lying down for more than four hours.  Being in the hospital.  Lack of physical activity (sedentary lifestyle).  Pregnancy, being in childbirth, or having recently given birth.  Taking medicines that contain estrogen, such as medicines to prevent pregnancy.  Smoking.  A history of any of the following: ? Blood clots or a blood clotting disease. ? Peripheral vascular disease. ? Inflammatory bowel disease. ? Cancer. ? Heart disease. ? Genetic conditions that affect how your blood clots, such as Factor V Leiden mutation. ? Neurological diseases that affect your legs (leg paresis). ? A recent injury, such as a car accident. ? Major or lengthy surgery. ? A central line placed inside a large vein. What are the signs or symptoms? Symptoms of this condition include:  Swelling, pain, or tenderness in an arm or leg.  Warmth,  redness, or discoloration in an arm or leg. If the clot is in your leg, symptoms may be more noticeable or worse when you stand or walk. Some people may not develop any symptoms. How is this diagnosed? This condition is diagnosed with:  A medical history and physical exam.  Tests, such as: ? Blood tests. These are done to check how well your blood clots. ? Ultrasound. This is done to check for clots. ? Venogram. For this test, contrast dye is injected into a vein and X-rays are taken to check for any clots. How is this treated? Treatment for this condition depends on:  The cause of your DVT.  Your risk for bleeding or developing more clots.  Any other medical conditions that you have. Treatment may include:  Taking a blood thinner (anticoagulant). This type of medicine prevents clots from forming. It may be taken by mouth, injected under the skin, or injected through an IV (catheter).  Injecting clot-dissolving medicines into the affected vein (catheter-directed thrombolysis).  Having surgery. Surgery may be done to: ? Remove the clot. ? Place a filter in a large vein to catch blood clots before they reach the lungs. Some treatments may be continued for up to six months. Follow these instructions at home: If you are taking blood thinners:  Take the medicine exactly as told by your health care provider. Some blood thinners need to be taken at the same time every day. Do not skip a dose.  Talk with  your health care provider before you take any medicines that contain aspirin or NSAIDs. These medicines increase your risk for dangerous bleeding.  Ask your health care provider about foods and drugs that could change the way the medicine works (may interact). Avoid those things if your health care provider tells you to do so.  Blood thinners can cause easy bruising and may make it difficult to stop bleeding. Because of this: ? Be very careful when using knives, scissors, or other  sharp objects. ? Use an electric razor instead of a blade. ? Avoid activities that could cause injury or bruising, and follow instructions about how to prevent falls.  Wear a medical alert bracelet or carry a card that lists what medicines you take. General instructions  Take over-the-counter and prescription medicines only as told by your health care provider.  Return to your normal activities as told by your health care provider. Ask your health care provider what activities are safe for you.  Wear compression stockings if recommended by your health care provider.  Keep all follow-up visits as told by your health care provider. This is important. How is this prevented? To lower your risk of developing this condition again:  For 30 or more minutes every day, do an activity that: ? Involves moving your arms and legs. ? Increases your heart rate.  When traveling for longer than four hours: ? Exercise your arms and legs every hour. ? Drink plenty of water. ? Avoid drinking alcohol.  Avoid sitting or lying for a long time without moving your legs.  If you have surgery or you are hospitalized, ask about ways to prevent blood clots. These may include taking frequent walks or using anticoagulants.  Stay at a healthy weight.  If you are a woman who is older than age 53, avoid unnecessary use of medicines that contain estrogen, such as some birth control pills.  Do not use any products that contain nicotine or tobacco, such as cigarettes and e-cigarettes. This is especially important if you take estrogen medicines. If you need help quitting, ask your health care provider. Contact a health care provider if:  You miss a dose of your blood thinner.  Your menstrual period is heavier than usual.  You have unusual bruising. Get help right away if:  You have: ? New or increased pain, swelling, or redness in an arm or leg. ? Numbness or tingling in an arm or leg. ? Shortness of  breath. ? Chest pain. ? A rapid or irregular heartbeat. ? A severe headache or confusion. ? A cut that will not stop bleeding.  There is blood in your vomit, stool, or urine.  You have a serious fall or accident, or you hit your head.  You feel light-headed or dizzy.  You cough up blood. These symptoms may represent a serious problem that is an emergency. Do not wait to see if the symptoms will go away. Get medical help right away. Call your local emergency services (911 in the [place] Do not drive yourself to the hospital. Summary  Deep vein thrombosis (DVT) is a condition in which a blood clot forms in a deep vein, such as a lower leg, thigh, or arm vein.  Symptoms can include swelling, warmth, pain, and redness in your leg or arm.  This condition may be treated with a blood thinner (anticoagulant medicine), medicine that is injected to dissolve blood clots,compression stockings, or surgery.  If you are prescribed blood thinners, take them exactly as  told. This information is not intended to replace advice given to you by your health care provider. Make sure you discuss any questions you have with your health care provider. Document Released: 06/17/2005 Document Revised: 05/30/2017 Document Reviewed: 11/15/2016 Elsevier Patient Education  2020 [employer].

## 2019-03-01 NOTE — Progress Notes (Signed)
   Subjective:   Patient ID: Shane [name], male    DOB: 1945/07/20, 73 y.o.   MRN: [id number]  HPI The patient is a 73 YO man coming in for ER follow up (in ER for acute right DVT in June, given rx for xarelto which he did not get until beginning of August around then he is not sure exactly when, went back to ER for leg swelling left and had extension of right DVT and new left DVT). Has continued taking xarelto since beginning of August. Has not noticed much difference. Still having swelling in both legs. Trying to elevate them. He does not know of anything which could have caused DVT. He is slightly less active due to covid-19. Denies any travel. No known cancer. He does have exposure to agent orange in the past and examined after service showed no effects from it around 10 years ago. Has lost about 15 pounds since last year and claims he is not trying to lose weight. No evaluation for secondary cause of DVT with ER workup. Did not follow up about this yet. Still having some pain in the legs. Denies abdominal pain, diarrhea, constipation, blood in stool. Last colonoscopy 2018 and due 2021. Denies having any covid-19 symptoms since this started.   PMH, [facility], social history reviewed and updated  Review of Systems  Constitutional: Positive for activity change and unexpected weight change.  HENT: Negative.   Eyes: Negative.   Respiratory: Negative for cough, chest tightness and shortness of breath.   Cardiovascular: Positive for leg swelling. Negative for chest pain and palpitations.  Gastrointestinal: Negative for abdominal distention, abdominal pain, constipation, diarrhea, nausea and vomiting.  Musculoskeletal: Positive for myalgias.  Skin: Negative.   Neurological: Negative.   Psychiatric/Behavioral: Negative.     Objective:  Physical Exam Constitutional:      Appearance: He is well-developed.  HENT:     Head: Normocephalic and atraumatic.  Neck:     Musculoskeletal: Normal range of  motion.  Cardiovascular:     Rate and Rhythm: Normal rate and regular rhythm.  Pulmonary:     Effort: Pulmonary effort is normal. No respiratory distress.     Breath sounds: Normal breath sounds. No wheezing or rales.  Abdominal:     General: Bowel sounds are normal. There is no distension.     Palpations: Abdomen is soft.     Tenderness: There is no abdominal tenderness. There is no rebound.  Musculoskeletal:        General: Tenderness present.     Right lower leg: Edema present.     Left lower leg: Edema present.     Comments: Right and left leg swollen to the knees bilateral with some pitting 2+ edema  Skin:    General: Skin is warm and dry.  Neurological:     Mental Status: He is alert and oriented to person, place, and time.     Coordination: Coordination normal.     Vitals:   03/01/19 1419  BP: 130/82  Pulse: 64  Temp: 98.6 F (37 C)  TempSrc: Oral  SpO2: 98%  Weight: 258 lb (117 kg)  Height: 6\' 2"  (1.88 m)   Visit time 40 minutes: greater than 50% of that time was spent in face to face counseling and coordination of care with the patient: counseled about causes of DVT, treatment, workup which needs to be done still, swelling and pain course  Assessment & Plan:  Flu shot given at visit

## 2019-03-02 ENCOUNTER — Ambulatory Visit: Payer: PPO | Admitting: Vascular Surgery

## 2019-03-02 ENCOUNTER — Encounter: Payer: Self-pay | Admitting: Vascular Surgery

## 2019-03-02 VITALS — BP 132/82 | HR 67 | Temp 97.8°F | Resp 20 | Ht 74.0 in | Wt 255.0 lb

## 2019-03-02 DIAGNOSIS — I82413 Acute embolism and thrombosis of femoral vein, bilateral: Secondary | ICD-10-CM

## 2019-03-02 NOTE — Progress Notes (Signed)
[facility] [place]  Patient name: Shane [name] MRN: [id number] DOB: 08/31/1945 Sex: male  REASON FOR CONSULT: Discuss DVT and chronic venous hypertension  HPI: Shane [name] is a 73 y.o. male, who is here today for discussion of DVT.  He is a very pleasant gentleman who presented with right leg swelling to the emergency department on 12/22/2018.  Duplex at that time revealed right femoral vein DVT which was nonocclusive and also DVT in the right greater saphenous vein in the thigh and knee level.  He was given a prescription for Xarelto but did not begin using the Xarelto until approximately 2 to 3 weeks ago.  He presented back with worsening swelling on 02/16/2019 to the emergency department.  Repeat duplex then showed extension with right common femoral vein, femoral vein, popliteal and tibial vein and gastrocnemius vein DVT.  He also had DVT in the left popliteal vein and posterior tibial vein.  His left leg had not been imaged previously.  He was initiated again on Xarelto and is seen for further discussion.  The patient has extensive changes of chronic venous hypertension worse on his right leg than left.  He has not been in compression and does not have any prior treatment for ulceration.  He does have an open ulcer on the medial aspect of his right leg at the medial malleolus.  He felt that this was due to an injury and felt that recurrence was due to the fact that he had used some old topical medication which may have been out of date.  Past Medical History:  Diagnosis Date  . Acute asthmatic bronchitis   . Anemia   . Anxiety disorder   . Borderline diabetes mellitus   . Borderline hypertension   . Degenerative joint disease   . Depression   . GERD (gastroesophageal reflux disease)   . History of adenomatous polyp of colon   . History of sinusitis   . Posttraumatic stress disorder   . Sleep apnea    Uses CPAP     Family History  Problem Relation Age of Onset  . Diabetes Father   . Arthritis Mother        Unknown cause of death  . Colon cancer Neg Hx   . Colon polyps Neg Hx   . Rectal cancer Neg Hx   . Stomach cancer Neg Hx     SOCIAL HISTORY: Social History   Socioeconomic History  . Marital status: Single    Spouse name: Not on file  . Number of children: 1  . Years of education: Not on file  . Highest education level: Not on file  Occupational History  . 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 Financial resource strain: Not on file  . Food insecurity    Worry: Not on file    Inability: Not on file  . Transportation needs    Medical: Not on file    Non-medical: Not on file  Tobacco Use  . Smoking status: Former Smoker    Packs/day: 0.50    Years: 25.00    Pack years: 12.50    Types: Cigarettes    Quit date: 07/02/1983    Years since quitting: 35.6  . Smokeless tobacco: Never Used  Substance and Sexual Activity  . Alcohol use: No  . Drug use: No  . Sexual activity: Not on file  Lifestyle  . Physical activity  Days per week: Not on file    Minutes per session: Not on file  . Stress: Not on file  Relationships  . Social Herbalist on phone: Not on file    Gets together: Not on file    Attends religious service: Not on file    Active member of club or organization: Not on file    Attends meetings of clubs or organizations: Not on file    Relationship status: Not on file  . Intimate partner violence    Fear of current or ex partner: Not on file    Emotionally abused: Not on file    Physically abused: Not on file    Forced sexual activity: Not on file  Other Topics Concern  . Not on file  Social History Narrative   Lives alone.  One child.     No Known Allergies  Current Outpatient Medications  Medication Sig Dispense Refill  . albuterol (PROAIR HFA) 108 (90 Base) MCG/ACT inhaler Inhale 2 puffs into the lungs  every 6 (six) hours as needed for wheezing or shortness of breath. 8.5 Inhaler 1  . BREO ELLIPTA 200-25 MCG/INH AEPB TAKE 1 PUFF BY MOUTH EVERY DAY (Patient taking differently: Inhale 1 puff into the lungs daily. ) 60 each 3  . ketoconazole (NIZORAL) 2 % shampoo Apply 1 application topically 2 (two) times a week.     . montelukast (SINGULAIR) 10 MG tablet Take 1 tablet (10 mg total) by mouth at bedtime. 90 tablet 1  . Propylene Glycol (SYSTANE BALANCE OP) Apply 1 drop to eye daily as needed (dry eyes). Reported on 12/19/2015    . rivaroxaban (XARELTO) 20 MG TABS tablet Take 1 tablet (20 mg total) by mouth daily with supper. 90 tablet 1  . triamcinolone (NASACORT ALLERGY 24HR) 55 MCG/ACT AERO nasal inhaler Use one spray in each nostril once daily as directed. (Patient taking differently: Place 1 spray into the nose daily. ) 1 Inhaler 5  . esomeprazole (NEXIUM) 40 MG capsule Take 1 capsule (40 mg total) by mouth 2 (two) times daily before a meal. 180 capsule 1   No current facility-administered medications for this visit.     REVIEW OF SYSTEMS:  [X]  denotes positive finding, [ ]  denotes negative finding Cardiac  Comments:  Chest pain or chest pressure:    Shortness of breath upon exertion:    Short of breath when lying flat:    Irregular heart rhythm: x       Vascular    Pain in calf, thigh, or hip brought on by ambulation: x   Pain in feet at night that wakes you up from your sleep:     Blood clot in your veins:    Leg swelling:         Pulmonary    Oxygen at home:    Productive cough:     Wheezing:         Neurologic    Sudden weakness in arms or legs:     Sudden numbness in arms or legs:     Sudden onset of difficulty speaking or slurred speech:    Temporary loss of vision in one eye:     Problems with dizziness:         Gastrointestinal    Blood in stool:     Vomited blood:         Genitourinary    Burning when urinating:     Blood in urine:  Psychiatric    Major  depression:         Hematologic    Bleeding problems:    Problems with blood clotting too easily: x       Skin    Rashes or ulcers:        Constitutional    Fever or chills:      PHYSICAL EXAM: Vitals:   03/02/19 1003  BP: 132/82  Pulse: 67  Resp: 20  Temp: 97.8 F (36.6 C)  SpO2: 96%  Weight: 115.7 kg  Height: 6\' 2"  (1.88 m)    GENERAL: The patient is a well-nourished male, in no acute distress. The vital signs are documented above. CARDIOVASCULAR: 2+ radial and 2+ dorsalis pedis pulses bilaterally.  Marked bilateral lower extremity swelling right greater than left. PULMONARY: There is good air exchange  ABDOMEN: Soft and non-tender  MUSCULOSKELETAL: There are no major deformities or cyanosis. NEUROLOGIC: No focal weakness or paresthesias are detected. SKIN: Extensive changes of chronic venous hypertension bilaterally right greater than left.  He has marked hemosiderin deposits circumferentially on the right calf and ankle and active venous ulcer at his right medial malleolus PSYCHIATRIC: The patient has a normal affect.  DATA:  Duplex from June and August of this year revealed DVT as discussed above  MEDICAL ISSUES: Long discussion with the patient.  I explained the critical need for compliance with his anticoagulation to reduce the risk for propagation of his clot.  I did explain the importance of elevation and compression.  He is a very large man with shoe size of 15.  We have directed him to [employer] for fitting of compression garments due to his size.  Explained the critical importance of keeping his feet elevated above his heart whenever possible and wearing a compression bilaterally as much as possible.  He will see [place] again on an as-needed basis   Rosetta Posner, MD [facility] Tel [phone] Pager [phone]

## 2019-03-09 ENCOUNTER — Telehealth: Payer: Self-pay | Admitting: Allergy and Immunology

## 2019-03-09 NOTE — Telephone Encounter (Signed)
Pt called to see if you could send in something for his sinus infection and coughing up green mucus. No fever. [facility]. [phone]

## 2019-03-10 ENCOUNTER — Other Ambulatory Visit: Payer: Self-pay | Admitting: *Deleted

## 2019-03-10 MED ORDER — AMOXICILLIN-POT CLAVULANATE 875-125 MG PO TABS: 1.0000 | ORAL_TABLET | Freq: Two times a day (BID) | ORAL | 0 refills | Status: AC

## 2019-03-10 MED ORDER — PREDNISONE 10 MG PO TABS: 10.0000 mg | ORAL_TABLET | Freq: Every day | ORAL | 0 refills | Status: DC

## 2019-03-10 NOTE — Telephone Encounter (Signed)
Please send in a prescription for Augmentin 875 1 tablet twice a day for 14 days and prednisone 10 mg 1 tablet once a day for 7 days

## 2019-03-10 NOTE — Telephone Encounter (Signed)
Prescription has been sent in. Called patient and advised. Patient verbalized understanding.

## 2019-03-10 NOTE — Telephone Encounter (Signed)
Dr Kozlow please advise 

## 2019-03-13 LAB — HYPERCOAGULABLE PANEL, COMPREHENSIVE
APTT 1:1 NP: 30.7 s — ABNORMAL HIGH
APTT 1:1 Saline: 45.3 s
APTT: 34.5 s — ABNORMAL HIGH
AT III Act/Nor PPP Chro: 124 %
Act. Prt C Resist w/FV Defic.: 3.4 ratio
Anticardiolipin Ab, IgG: <10 [GPL'U]
Anticardiolipin Ab, IgM: <10 [MPL'U]
Beta-2 Glycoprotein I, IgA: <10 SAU
Beta-2 Glycoprotein I, IgG: <10 SGU
Beta-2 Glycoprotein I, IgM: <10 SMU
DRVVT Confirm Seconds: 57 s
DRVVT Ratio: 1.5 ratio — ABNORMAL HIGH
DRVVT Screen Seconds: 92.7 s — ABNORMAL HIGH
Factor VII Antigen**: 69 %
Factor VIII Activity: 115 %
Hexagonal Phospholipid Neutral: 1 s
Homocysteine: 13.4 umol/L
Prot C Ag Act/Nor PPP Imm: 76 %
Prot S Ag Act/Nor PPP Imm: 134 %
Protein C Ag/FVII Ag Ratio**: 1.1 ratio
Protein S Ag/FVII Ag Ratio**: 1.9 ratio

## 2019-03-13 LAB — SARS-COV-2 ANTIBODY, IGM: SARS-CoV-2 Antibody, IgM: NEGATIVE

## 2019-03-16 ENCOUNTER — Other Ambulatory Visit: Payer: Self-pay

## 2019-03-16 ENCOUNTER — Encounter: Payer: Self-pay | Admitting: Allergy and Immunology

## 2019-03-16 ENCOUNTER — Ambulatory Visit (INDEPENDENT_AMBULATORY_CARE_PROVIDER_SITE_OTHER): Payer: PPO | Admitting: Allergy and Immunology

## 2019-03-16 VITALS — BP 168/90 | HR 66 | Temp 97.9°F | Resp 16 | Ht 74.0 in

## 2019-03-16 DIAGNOSIS — J324 Chronic pansinusitis: Secondary | ICD-10-CM

## 2019-03-16 DIAGNOSIS — J3089 Other allergic rhinitis: Secondary | ICD-10-CM

## 2019-03-16 DIAGNOSIS — J4540 Moderate persistent asthma, uncomplicated: Secondary | ICD-10-CM | POA: Diagnosis not present

## 2019-03-16 MED ORDER — MONTELUKAST SODIUM 10 MG PO TABS: 10.0000 mg | ORAL_TABLET | Freq: Every day | ORAL | 1 refills | Status: DC

## 2019-03-16 NOTE — Progress Notes (Signed)
[place]   Follow-up Note  Referring Provider: [name], * Primary Provider: [name], MD Date of Office Visit: 03/16/2019  Subjective:   Shane [name] (DOB: 1946-04-11) is a 73 y.o. male who returns to the [facility] [place] on 03/16/2019 in re-evaluation of the following:  HPI: Shane [name] returns to this clinic in reevaluation of respiratory tract problems associated with asthma and a history of chronic sinusitis and a history of LPR and sleep apnea treated with CPAP.  I last saw him in this clinic on 29 September 2018.  He states that his asthma was really doing very well while consistently using Breo and he had very little problems with his nose while using montelukast.  Rarely did use a short acting bronchodilator.  He did not require a systemic steroid or an antibiotic for any type of airway issue.  However, he contacted me on 09 March 2019 at which point in time he was having cough and lots of postnasal drip and throat clearing and stuff getting hung up in his chest and nasal congestion and we started him on Augmentin and a very short course of systemic steroids.  He has not really improved that much during this timeframe.  He has not had any fever or ugly sputum production or ugly nasal discharge or anosmia or headaches or chest pain.  Allergies as of 03/16/2019   No Known Allergies     Medication List      albuterol 108 (90 Base) MCG/ACT inhaler Commonly known as: ProAir HFA Inhale 2 puffs into the lungs every 6 (six) hours as needed for wheezing or shortness of breath.   amoxicillin-clavulanate 875-125 MG tablet Commonly known as: Augmentin Take 1 tablet by mouth 2 (two) times daily for 14 days.   Breo Ellipta 200-25 MCG/INH Aepb Generic drug: fluticasone furoate-vilanterol TAKE 1 PUFF BY MOUTH EVERY DAY What changed: See the new instructions.   esomeprazole 40 MG capsule Commonly  known as: NexIUM Take 1 capsule (40 mg total) by mouth 2 (two) times daily before a meal.   ketoconazole 2 % shampoo Commonly known as: NIZORAL Apply 1 application topically 2 (two) times a week.   montelukast 10 MG tablet Commonly known as: SINGULAIR Take 1 tablet (10 mg total) by mouth at bedtime.   predniSONE 10 MG tablet Commonly known as: DELTASONE Take 1 tablet (10 mg total) by mouth daily with breakfast for 7 days.   rivaroxaban 20 MG Tabs tablet Commonly known as: Xarelto Take 1 tablet (20 mg total) by mouth daily with supper.   SYSTANE BALANCE OP Apply 1 drop to eye daily as needed (dry eyes). Reported on 12/19/2015   triamcinolone 55 MCG/ACT Aero nasal inhaler Commonly known as: Nasacort Allergy 24HR Use one spray in each nostril once daily as directed.       Past Medical History:  Diagnosis Date  . Acute asthmatic bronchitis   . Anemia   . Anxiety disorder   . Borderline diabetes mellitus   . Borderline hypertension   . Degenerative joint disease   . Depression   . GERD (gastroesophageal reflux disease)   . History of adenomatous polyp of colon   . History of sinusitis   . Posttraumatic stress disorder   . Sleep apnea    Uses CPAP    Past Surgical History:  Procedure Laterality Date  . COLONOSCOPY    . FINGER SURGERY Right  index  . NASAL SINUS SURGERY     x 2  . POLYPECTOMY      Review of systems negative except as noted in HPI / PMHx or noted below:  Review of Systems  Constitutional: Negative.   HENT: Negative.   Eyes: Negative.   Respiratory: Negative.   Cardiovascular: Negative.   Gastrointestinal: Negative.   Genitourinary: Negative.   Musculoskeletal: Negative.   Skin: Negative.   Neurological: Negative.   Endo/Heme/Allergies: Negative.   Psychiatric/Behavioral: Negative.      Objective:   Vitals:   03/16/19 1431 03/16/19 1517  BP: (!) 148/86 (!) 168/90  Pulse: 66   Resp: 16   Temp: 97.9 F (36.6 C)   SpO2: 96%     Height: 6\' 2"  (188 cm)      Physical Exam Constitutional:      Appearance: He is not diaphoretic.  HENT:     Head: Normocephalic.     Right Ear: Tympanic membrane, ear canal and external ear normal.     Left Ear: Tympanic membrane, ear canal and external ear normal.     Nose: Nose normal. No mucosal edema or rhinorrhea.     Mouth/Throat:     Pharynx: Uvula midline. No oropharyngeal exudate.  Eyes:     Conjunctiva/sclera: Conjunctivae normal.  Neck:     Thyroid: No thyromegaly.     Trachea: Trachea normal. No tracheal tenderness or tracheal deviation.  Cardiovascular:     Rate and Rhythm: Normal rate and regular rhythm.     Heart sounds: Normal heart sounds, S1 normal and S2 normal. No murmur.  Pulmonary:     Effort: No respiratory distress.     Breath sounds: Normal breath sounds. No stridor. No wheezing or rales.  Lymphadenopathy:     Head:     Right side of head: No tonsillar adenopathy.     Left side of head: No tonsillar adenopathy.     Cervical: No cervical adenopathy.  Skin:    Findings: No erythema or rash.     Nails: There is no clubbing.   Neurological:     Mental Status: He is alert.     Diagnostics:    Spirometry was performed and demonstrated an FEV1 of 2.88 at 87 % of predicted.  The patient had an Asthma Control Test with the following results: ACT Total Score: 15.    Assessment and Plan:   1. Not well controlled moderate persistent asthma   2. Other allergic rhinitis   3. Chronic pansinusitis     1. Treat and prevent inflammation:   A. OTC Nasacort one spray each nostril 1 time per day  B. montelukast 10 mg one tablet once a day  C. Breo 200 one inhalation 1 time per day.    2. Treat and prevent reflux:   A. esomeprazole 40mg  2 times per day  B. Continue to Eliminate caffeine consumption  3. If needed:   A. nasal saline  B. OTC antihistamine  - Allegra    C. Proventil HFA 2 puffs every 4-6 hours  D. OTC Mucinex DM 2 tablets twice a  day  4.  For this most recent episode utilize the following:   A.  Continue Augmentin for a full 14 days  B.  Prednisone 20 mg daily for 4 days  5.  Address blood pressure issue with primary care doctor  6.  Obtain fall flu vaccine (and COVID vaccine)  7. Return to clinic in 6 months or earlier if  problem   I will treat [name] with a systemic steroid and he will remain on broad-spectrum antibiotics and hopefully he will resolve his respiratory tract inflammation and possible infection with this plan.  He was doing relatively well up until about 10 days ago or so.  He certainly has high blood pressure and he needs to get this under control with his primary care doctor and we did inform him about his blood pressure today.  I informed him that he was at risk of stroke because of this blood pressure issue.  Assuming he does well I will see him back in his clinic in 6 months or earlier if there is a problem.  Allena Katz, MD [facility]

## 2019-03-16 NOTE — Patient Instructions (Addendum)
  1. Treat and prevent inflammation:   A. OTC Nasacort one spray each nostril 1 time per day  B. montelukast 10 mg one tablet once a day  C. Breo 200 one inhalation 1 time per day.    2. Treat and prevent reflux:   A. esomeprazole 40mg  2 times per day  B. Continue to Eliminate caffeine consumption  3. If needed:   A. nasal saline  B. OTC antihistamine  - Allegra    C. Proventil HFA 2 puffs every 4-6 hours  D. OTC Mucinex DM 2 tablets twice a day  4.  For this most recent episode utilize the following:   A.  Continue Augmentin for a full 14 days  B.  Prednisone 20 mg daily for 4 days  5.  Address blood pressure issue with primary care doctor  6.  Obtain fall flu vaccine (and COVID vaccine)  7. Return to clinic in 6 months or earlier if problem

## 2019-03-17 ENCOUNTER — Encounter: Payer: Self-pay | Admitting: Family

## 2019-03-17 ENCOUNTER — Encounter: Payer: Self-pay | Admitting: Allergy and Immunology

## 2019-03-17 ENCOUNTER — Ambulatory Visit (INDEPENDENT_AMBULATORY_CARE_PROVIDER_SITE_OTHER): Payer: PPO | Admitting: Family

## 2019-03-17 ENCOUNTER — Other Ambulatory Visit: Payer: Self-pay

## 2019-03-17 VITALS — BP 146/92 | HR 79 | Temp 98.4°F | Ht 74.0 in | Wt 250.4 lb

## 2019-03-17 DIAGNOSIS — R030 Elevated blood-pressure reading, without diagnosis of hypertension: Secondary | ICD-10-CM

## 2019-03-17 DIAGNOSIS — E538 Deficiency of other specified B group vitamins: Secondary | ICD-10-CM | POA: Diagnosis not present

## 2019-03-17 DIAGNOSIS — R899 Unspecified abnormal finding in specimens from other organs, systems and tissues: Secondary | ICD-10-CM | POA: Diagnosis not present

## 2019-03-17 DIAGNOSIS — D649 Anemia, unspecified: Secondary | ICD-10-CM

## 2019-03-17 NOTE — Progress Notes (Signed)
Shane [name] is a 73 y.o. male with the following history as recorded in Epic[employer]:  Patient Active Problem List   Diagnosis Date Noted  . Acute deep vein thrombosis (DVT) of proximal vein of both lower extremities ([place]) 03/01/2019  . Bradycardia 07/30/2018  . Sleep apnea 07/30/2018  . Murmur 04/30/2018  . Dizziness 04/30/2018  . Palpitations 04/06/2016  . Elevated blood pressure reading without diagnosis of hypertension 01/12/2016  . Sinusitis, chronic 12/19/2015  . Pain in joint, upper arm 11/23/2015  . Numbness and tingling in left arm 11/23/2015  . Asthmatic bronchitis 02/21/2015  . PTSD (post-traumatic stress disorder) 02/21/2015  . Hemorrhoids 06/15/2014  . Anemia 03/31/2008  . GERD 03/31/2008  . Impaired fasting glucose 03/31/2008    Current Outpatient Medications  Medication Sig Dispense Refill  . albuterol (PROAIR HFA) 108 (90 Base) MCG/ACT inhaler Inhale 2 puffs into the lungs every 6 (six) hours as needed for wheezing or shortness of breath. 8.5 Inhaler 1  . amoxicillin-clavulanate (AUGMENTIN) 875-125 MG tablet Take 1 tablet by mouth 2 (two) times daily for 14 days. 28 tablet 0  . BREO ELLIPTA 200-25 MCG/INH AEPB TAKE 1 PUFF BY MOUTH EVERY DAY (Patient taking differently: Inhale 1 puff into the lungs daily. ) 60 each 3  . ketoconazole (NIZORAL) 2 % shampoo Apply 1 application topically 2 (two) times a week.     . montelukast (SINGULAIR) 10 MG tablet Take 1 tablet (10 mg total) by mouth at bedtime. 90 tablet 1  . Propylene Glycol (SYSTANE BALANCE OP) Apply 1 drop to eye daily as needed (dry eyes). Reported on 12/19/2015    . rivaroxaban (XARELTO) 20 MG TABS tablet Take 1 tablet (20 mg total) by mouth daily with supper. 90 tablet 1  . triamcinolone (NASACORT ALLERGY 24HR) 55 MCG/ACT AERO nasal inhaler Use one spray in each nostril once daily as directed. (Patient taking differently: Place 1 spray into the nose daily. ) 1 Inhaler 5  . esomeprazole (NEXIUM) 40 MG capsule  Take 1 capsule (40 mg total) by mouth 2 (two) times daily before a meal. 180 capsule 1   No current facility-administered medications for this visit.     Allergies: Patient has no known allergies.  Past Medical History:  Diagnosis Date  . Acute asthmatic bronchitis   . Anemia   . Anxiety disorder   . Borderline diabetes mellitus   . Borderline hypertension   . Degenerative joint disease   . Depression   . GERD (gastroesophageal reflux disease)   . History of adenomatous polyp of colon   . History of sinusitis   . Posttraumatic stress disorder   . Sleep apnea    Uses CPAP    Past Surgical History:  Procedure Laterality Date  . COLONOSCOPY    . FINGER SURGERY Right    index  . NASAL SINUS SURGERY     x 2  . POLYPECTOMY      Family History  Problem Relation Age of Onset  . Diabetes Father   . Arthritis Mother        Unknown cause of death  . Colon cancer Neg Hx   . Colon polyps Neg Hx   . Rectal cancer Neg Hx   . Stomach cancer Neg Hx     Social History   Tobacco Use  . Smoking status: Former Smoker    Packs/day: 0.50    Years: 25.00    Pack years: 12.50    Types: Cigarettes    Quit  date: 07/02/1983    Years since quitting: 35.7  . Smokeless tobacco: Never Used  Substance Use Topics  . Alcohol use: No    Subjective:  Patient was seen by his asthma specialist yesterday and blood pressure was noted to be elevated; blood pressure reading at the office yesterday was 160/90; today's readings are averaging: 140/90 and 146/92; no prior history of documented hypertension but readings have mildly elevated in the past; does not check his blood pressure regularly; Denies any chest pain, shortness of breath, blurred vision or headache. Admits he has been eating more salt recently; blood pressure at last OV 2 weeks ago was normal;      Objective:  Vitals:   03/17/19 1155 03/17/19 1156  BP: 140/90 (!) 146/92  Pulse: 79   Temp: 98.4 F (36.9 C)   TempSrc: Oral   SpO2:  97%   Weight: 250 lb 6.4 oz (113.6 kg)   Height: _0  (1.88 m)     General: Well developed, well nourished, in no acute distress  Skin : Warm and dry.  Head: Normocephalic and atraumatic  Eyes: Sclera and conjunctiva clear; pupils round and reactive to light; extraocular movements intact  Ears: External normal; canals clear; tympanic membranes normal  Oropharynx: Pink, supple. No suspicious lesions  Neck: Supple without thyromegaly, adenopathy  Lungs: Respirations unlabored; clear to auscultation bilaterally without wheeze, rales, rhonchi  CVS exam: normal rate and regular rhythm.  Abdomen: Soft; nontender; nondistended; normoactive bowel sounds; no masses or hepatosplenomegaly  Musculoskeletal: No deformities; no active joint inflammation  Extremities: No edema, cyanosis, clubbing  Vessels: Symmetric bilaterally  Neurologic: Alert and oriented; speech intact; face symmetrical; moves all extremities well; CNII-XII intact without focal deficit   Assessment:  1. Abnormal laboratory test result   2. Low hemoglobin   3. B12 deficiency   4. Elevated blood-pressure reading without diagnosis of hypertension     Plan:  EKG shows sinus bradycardia; DASH diet discussed; will repeat CBC and CMP today- hemoglobin was down when checked 2 weeks ago- ? If related;  Plan to follow-up with his PCP in 2 weeks for re-check. Stressed multiple times the need to take his Xarelto as prescribed to appropriately treat his DVT.  No follow-ups on file.  Orders Placed This Encounter  Procedures  . CBC w/Diff    Standing Status:   Future    Standing Expiration Date:   03/16/2020  . Comp Met (CMET)    Standing Status:   Future    Standing Expiration Date:   03/16/2020  . Iron, TIBC and Ferritin Panel    Standing Status:   Future    Standing Expiration Date:   03/16/2020  . B12    Standing Status:   Future    Standing Expiration Date:   03/16/2020  . EKG 12-Lead    Requested Prescriptions    No  prescriptions requested or ordered in this encounter

## 2019-03-17 NOTE — Patient Instructions (Signed)

## 2019-03-19 ENCOUNTER — Ambulatory Visit: Payer: PPO | Admitting: Internal Medicine

## 2019-04-01 ENCOUNTER — Ambulatory Visit: Payer: PPO | Admitting: Internal Medicine

## 2019-04-01 DIAGNOSIS — Z0289 Encounter for other administrative examinations: Secondary | ICD-10-CM

## 2019-04-19 ENCOUNTER — Telehealth: Payer: Self-pay | Admitting: Family

## 2019-04-19 NOTE — Telephone Encounter (Signed)
Patient came in today stating he was supposed to have a follow up appt with Valere Dross on labs that she had ordered.   I was not able to find anything in the chart.  Please advise.

## 2019-04-20 NOTE — Telephone Encounter (Signed)
Spoke with patient and he has been informed that his labs were never collected and he should have them done and follow up with [facility] in 2 weeks after labs are done per Laura's last note.

## 2019-05-30 ENCOUNTER — Other Ambulatory Visit: Payer: Self-pay

## 2019-05-30 ENCOUNTER — Emergency Department (HOSPITAL_COMMUNITY): Payer: PPO

## 2019-05-30 ENCOUNTER — Encounter (HOSPITAL_COMMUNITY): Payer: Self-pay

## 2019-05-30 ENCOUNTER — Emergency Department (HOSPITAL_COMMUNITY)
Admission: EM | Admit: 2019-05-30 | Discharge: 2019-05-30 | Disposition: A | Discharge status: Home / Self Care | Payer: PPO | Attending: Emergency Medicine | Admitting: Emergency Medicine

## 2019-05-30 DIAGNOSIS — Z7901 Long term (current) use of anticoagulants: Secondary | ICD-10-CM | POA: Diagnosis not present

## 2019-05-30 DIAGNOSIS — I10 Essential (primary) hypertension: Secondary | ICD-10-CM | POA: Diagnosis not present

## 2019-05-30 DIAGNOSIS — M7989 Other specified soft tissue disorders: Secondary | ICD-10-CM | POA: Diagnosis not present

## 2019-05-30 DIAGNOSIS — L089 Local infection of the skin and subcutaneous tissue, unspecified: Secondary | ICD-10-CM | POA: Diagnosis not present

## 2019-05-30 DIAGNOSIS — Z87891 Personal history of nicotine dependence: Secondary | ICD-10-CM | POA: Diagnosis not present

## 2019-05-30 DIAGNOSIS — Z79899 Other long term (current) drug therapy: Secondary | ICD-10-CM | POA: Insufficient documentation

## 2019-05-30 DIAGNOSIS — M79675 Pain in left toe(s): Secondary | ICD-10-CM | POA: Diagnosis not present

## 2019-05-30 LAB — CBC WITH DIFFERENTIAL/PLATELET
Abs Immature Granulocytes: 0.01 10*3/uL (ref 0.00–0.07)
Basophils Absolute: 0 10*3/uL (ref 0.0–0.1)
Basophils Relative: 1 %
Eosinophils Absolute: 0.3 10*3/uL (ref 0.0–0.5)
Eosinophils Relative: 5 %
HCT: 35.9 % — ABNORMAL LOW (ref 39.0–52.0)
Hemoglobin: 10.5 g/dL — ABNORMAL LOW (ref 13.0–17.0)
Immature Granulocytes: 0 %
Lymphocytes Relative: 33 %
Lymphs Abs: 1.9 10*3/uL (ref 0.7–4.0)
MCH: 19.1 pg — ABNORMAL LOW (ref 26.0–34.0)
MCHC: 29.2 g/dL — ABNORMAL LOW (ref 30.0–36.0)
MCV: 65.3 fL — ABNORMAL LOW (ref 80.0–100.0)
Monocytes Absolute: 0.7 10*3/uL (ref 0.1–1.0)
Monocytes Relative: 13 %
Neutro Abs: 2.8 10*3/uL (ref 1.7–7.7)
Neutrophils Relative %: 48 %
Platelets: 164 10*3/uL (ref 150–400)
RBC: 5.5 MIL/uL (ref 4.22–5.81)
RDW: 17.6 % — ABNORMAL HIGH (ref 11.5–15.5)
WBC: 5.7 10*3/uL (ref 4.0–10.5)
nRBC: 0 % (ref 0.0–0.2)

## 2019-05-30 LAB — BASIC METABOLIC PANEL
Anion gap: 9 (ref 5–15)
BUN: 18 mg/dL (ref 8–23)
CO2: 23 mmol/L (ref 22–32)
Calcium: 8.8 mg/dL — ABNORMAL LOW (ref 8.9–10.3)
Chloride: 108 mmol/L (ref 98–111)
Creatinine, Ser: 1.11 mg/dL (ref 0.61–1.24)
GFR calc Af Amer: >60 mL/min (ref >60)
GFR calc non Af Amer: >60 mL/min (ref >60)
Glucose, Bld: 90 mg/dL (ref 70–99)
Potassium: 4 mmol/L (ref 3.5–5.1)
Sodium: 140 mmol/L (ref 135–145)

## 2019-05-30 MED ORDER — DOXYCYCLINE HYCLATE 100 MG PO CAPS: 100.0000 mg | ORAL_CAPSULE | Freq: Two times a day (BID) | ORAL | 0 refills | Status: AC

## 2019-05-30 MED ORDER — DOXYCYCLINE HYCLATE 100 MG PO TABS
100.0000 mg | ORAL_TABLET | Freq: Once | ORAL | Status: AC
  Administered 2019-05-30: 100 mg via ORAL
  Filled 2019-05-30: qty 1

## 2019-05-30 NOTE — ED Provider Notes (Signed)
[facility] DEPT Provider Note   CSN: [id number] Arrival date & time: 05/30/19  1849     History   Chief Complaint Chief Complaint  Patient presents with   Toe Pain    HPI Shane [name] is a 73 y.o. 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  Patient has not treated his symptoms.  He states he has not looked at his toe to determine if there is any swelling or wounds though states he did not injure his toe.  He denies history of gout.  Denies fever or chills.  Denies history of diabetes.  He states he had a duration of time lasting about 3 weeks where he did not take his Xarelto as he lost his medication, however once he found that he began taking it again.     The history is provided by the patient and medical records.    Past Medical History:  Diagnosis Date   Acute asthmatic bronchitis    Anemia    Anxiety disorder    Borderline diabetes mellitus    Borderline hypertension    Degenerative joint disease    Depression    GERD (gastroesophageal reflux disease)    History of adenomatous polyp of colon    History of sinusitis    Posttraumatic stress disorder    Sleep apnea    Uses CPAP    Patient Active Problem List   Diagnosis Date Noted   Acute deep vein thrombosis (DVT) of proximal vein of both lower extremities ([place]) 03/01/2019   Bradycardia 07/30/2018   Sleep apnea 07/30/2018   Murmur 04/30/2018   Dizziness 04/30/2018   Palpitations 04/06/2016   Elevated blood pressure reading without diagnosis of hypertension 01/12/2016   Sinusitis, chronic 12/19/2015   Pain in joint, upper arm 11/23/2015   Numbness and tingling in left arm 11/23/2015   Asthmatic bronchitis 02/21/2015   PTSD (post-traumatic stress disorder) 02/21/2015   Hemorrhoids  06/15/2014   Anemia 03/31/2008   GERD 03/31/2008   Impaired fasting glucose 03/31/2008    Past Surgical History:  Procedure Laterality Date   COLONOSCOPY     FINGER SURGERY Right    index   NASAL SINUS SURGERY     x 2   POLYPECTOMY          Home Medications    Prior to Admission medications   Medication Sig Start Date End Date Taking? Authorizing Provider  albuterol (PROAIR HFA) 108 (90 Base) MCG/ACT inhaler Inhale 2 puffs into the lungs every 6 (six) hours as needed for wheezing or shortness of breath. 08/18/18   Kozlow, Donnamarie Poag, MD  BREO ELLIPTA 200-25 MCG/INH AEPB TAKE 1 PUFF BY MOUTH EVERY DAY Patient taking differently: Inhale 1 puff into the lungs daily.  12/14/18   Kozlow, Donnamarie Poag, MD  doxycycline (VIBRAMYCIN) 100 MG capsule Take 1 capsule (100 mg total) by mouth 2 (two) times daily for 7 days. 05/30/19 06/06/19  Mong Neal, [place] N, PA-C  esomeprazole (NEXIUM) 40 MG capsule Take 1 capsule (40 mg total) by mouth 2 (two) times daily before a meal. 08/18/18 03/16/19  Kozlow, Donnamarie Poag, MD  ketoconazole (NIZORAL) 2 % shampoo Apply 1 application topically 2 (two) times a week.     [provider]  montelukast (SINGULAIR) 10 MG tablet Take 1 tablet (10 mg total) by mouth at bedtime. 03/16/19  Kozlow, Donnamarie Poag, MD  Propylene Glycol (SYSTANE BALANCE OP) Apply 1 drop to eye daily as needed (dry eyes). Reported on 12/19/2015    [provider]  rivaroxaban (XARELTO) 20 MG TABS tablet Take 1 tablet (20 mg total) by mouth daily with supper. 03/01/19   [name], MD  triamcinolone (NASACORT ALLERGY 24HR) 55 MCG/ACT AERO nasal inhaler Use one spray in each nostril once daily as directed. Patient taking differently: Place 1 spray into the nose daily.  01/28/17   Kozlow, Donnamarie Poag, MD    Family History Family History  Problem Relation Age of Onset   Diabetes Father    Arthritis Mother        Unknown cause of death   Colon cancer Neg Hx    Colon polyps Neg Hx     Rectal cancer Neg Hx    Stomach cancer Neg Hx     Social History Social History   Tobacco Use   Smoking status: Former Smoker    Packs/day: 0.50    Years: 25.00    Pack years: 12.50    Types: Cigarettes    Quit date: 07/02/1983    Years since quitting: 35.9   Smokeless tobacco: Never Used  Substance Use Topics   Alcohol use: No   Drug use: No     Allergies   Patient has no known allergies.   Review of Systems Review of Systems  Constitutional: Negative for chills and fever.  Respiratory: Negative for shortness of breath.   Cardiovascular: Positive for leg swelling. Negative for chest pain.  Musculoskeletal: Positive for arthralgias.  Neurological: Negative for numbness.  All other systems reviewed and are negative.    Physical Exam Updated Vital Signs BP (!) 194/94 (BP Location: Left Arm)    Pulse (!) 52    Temp 98 F (36.7 C) (Oral)    Resp 18    Ht 6\' 2"  (1.88 m)    Wt 118.8 kg    SpO2 100%    BMI 33.64 kg/m   Physical Exam Vitals signs and nursing note reviewed.  Constitutional:      General: He is not in acute distress.    Appearance: He is well-developed. He is not ill-appearing.  HENT:     Head: Normocephalic and atraumatic.  Eyes:     Conjunctiva/sclera: Conjunctivae normal.  Cardiovascular:     Rate and Rhythm: Normal rate and regular rhythm.  Pulmonary:     Effort: Pulmonary effort is normal. No respiratory distress.     Breath sounds: Normal breath sounds.  Abdominal:     Palpations: Abdomen is soft.  Musculoskeletal:     Comments: Bilateral lower legs with compression stockings (with open toe) in place, however the compression stocking on the left foot appears to be embedded in the base of the first toe.  Bilateral compression stockings were removed. The base of the left great toe has a wound that is nearly circumferential that is malodorous with a serous/bloody discharge.  There is significant tenderness to this area and patient has  normal sensation to the distal toes.  The distal toe is warm and appears to have normal perfusion.  There is some mild surrounding erythema however this does not extend any distance of the foot.  Every toenail is overgrown and hyperkeratotic. Bilateral lower legs with significant dry skin.  Intact distal pulses.  No further signs of cellulitis.  See images below.  Skin:    General: Skin is warm.  Neurological:  Mental Status: He is alert.  Psychiatric:        Behavior: Behavior normal.            ED Treatments / Results  Labs (all labs ordered are listed, but only abnormal results are displayed) Labs Reviewed  CBC WITH DIFFERENTIAL/PLATELET - Abnormal; Notable for the following components:      Result Value   Hemoglobin 10.5 (*)    HCT 35.9 (*)    MCV 65.3 (*)    MCH 19.1 (*)    MCHC 29.2 (*)    RDW 17.6 (*)    All other components within normal limits  BASIC METABOLIC PANEL - Abnormal; Notable for the following components:   Calcium 8.8 (*)    All other components within normal limits    EKG None  Radiology Dg Toe Great Left  Result Date: 05/30/2019 CLINICAL DATA:  Left toe pain EXAM: LEFT GREAT TOE COMPARISON:  None. FINDINGS: There is no evidence of fracture or dislocation. No definite cortical destruction is seen. There is overlying skin irregularity and soft tissue swelling. IMPRESSION: No definite acute osseous abnormality. Overlying soft tissue swelling and skin irregularity/ulceration. Electronically Signed   By: Prudencio Pair M.D.   On: 05/30/2019 20:15    Procedures Procedures (including critical care time)  Medications Ordered in ED Medications  doxycycline (VIBRA-TABS) tablet 100 mg (100 mg Oral Given 05/30/19 2238)     Initial Impression / Assessment and Plan / ED Course  I have reviewed the triage vital signs and the nursing notes.  Pertinent labs & imaging results that were available during my care of the patient were reviewed by me and  considered in my medical decision making (see chart for details).        Patient presenting with progressively worsening left great toe pain.  After examination and discussion, patient has not removed the compression stockings that were placed on him back in August 2020 when he was re-evaluated for bilateral DVT.  He states he has not bathed since that time either as his hot water heater is not working.  He also states he did not look at his feet to evaluate why he was having pain.  He followed up with vascular surgery outpatient and.  September and had PCP follow-up as well during that month, however is not followed up since.  On exam, I removed his compression stockings and he has expected skin changes to bilateral lower legs for lack of hygiene and pt not removing stockings for months.  The left compression stocking (which does not have the toe covering) seem to be embedded in the base of the left great toe is in wound and signs of infection.  The distal toes appear well perfused, however he will require podiatry for toenail management.  He is afebrile without systemic symptoms and labs today are reassuring without leukocytosis or other acute change from baseline.  Had discussion with patient about importance of personal hygiene as well as instructed him against wearing compression stockings consistently for multiple months at a time without removing them.  Discussed importance of proper skin care and wound care.  He is given a dose of doxycycline in the ED and will be discharged with p.o. course.  Provided podiatry referral for close follow-up.  Instructed wound recheck in 2 to 3 days and strict return precautions should he develop fever or chills or worsening symptoms.  Patient provided assistance with hygiene of his lower legs while in the ED.  Patient  discussed with Dr. Tamera Punt.  Discussed results, findings, treatment and follow up. Patient advised of return precautions. Patient verbalized  understanding and agreed with plan.   Final Clinical Impressions(s) / ED Diagnoses   Final diagnoses:  Toe infection    ED Discharge Orders         Ordered    doxycycline (VIBRAMYCIN) 100 MG capsule  2 times daily     05/30/19 2222           Lyana Asbill, [place] N, [place] 05/31/19 0032    Malvin Johns, MD 06/01/19 1020

## 2019-05-30 NOTE — Discharge Instructions (Addendum)
It is important that you maintain good personal hygiene to help treat your infection.  Wash your wound daily with soap and water. Take the antibiotic, doxycycline, every 12 hours until gone. Schedule an appointment with the foot doctor, Dr. Amalia Hailey, for wound check and toenail care. It is also important that you not miss any doses of your blood thinner. You can get compression stockings over the counter at any pharmacy or stores such as [employer]. Elevate your legs as much as possible to help with swelling. Follow-up closely with your primary care provider. Return to the emergency department if you develop fever or worsening symptoms.

## 2019-05-30 NOTE — ED Notes (Signed)
Pt was verbalized discharge instructions. Pt had no further questions at this time. NAD. 

## 2019-05-30 NOTE — ED Triage Notes (Signed)
Pt reports L great toe pain. Denies injury.

## 2019-05-30 NOTE — ED Notes (Signed)
I called patient name in the lobby and no one answer

## 2019-05-31 ENCOUNTER — Ambulatory Visit: Payer: PPO | Admitting: Internal Medicine

## 2019-06-04 ENCOUNTER — Ambulatory Visit: Payer: PPO | Admitting: Podiatry

## 2019-06-07 ENCOUNTER — Ambulatory Visit: Payer: PPO | Admitting: Podiatry

## 2019-06-07 ENCOUNTER — Ambulatory Visit: Payer: PPO | Admitting: Internal Medicine

## 2019-06-09 ENCOUNTER — Emergency Department (HOSPITAL_COMMUNITY)
Admission: EM | Admit: 2019-06-09 | Discharge: 2019-06-10 | Disposition: A | Discharge status: Home / Self Care | Payer: PPO | Attending: Emergency Medicine | Admitting: Emergency Medicine

## 2019-06-09 ENCOUNTER — Other Ambulatory Visit: Payer: Self-pay

## 2019-06-09 ENCOUNTER — Encounter (HOSPITAL_COMMUNITY): Payer: Self-pay | Admitting: Emergency Medicine

## 2019-06-09 DIAGNOSIS — R1032 Left lower quadrant pain: Secondary | ICD-10-CM | POA: Diagnosis not present

## 2019-06-09 DIAGNOSIS — Z79899 Other long term (current) drug therapy: Secondary | ICD-10-CM | POA: Insufficient documentation

## 2019-06-09 DIAGNOSIS — Z87891 Personal history of nicotine dependence: Secondary | ICD-10-CM | POA: Insufficient documentation

## 2019-06-09 DIAGNOSIS — R109 Unspecified abdominal pain: Secondary | ICD-10-CM | POA: Diagnosis not present

## 2019-06-09 DIAGNOSIS — Z7901 Long term (current) use of anticoagulants: Secondary | ICD-10-CM | POA: Diagnosis not present

## 2019-06-09 DIAGNOSIS — K4090 Unilateral inguinal hernia, without obstruction or gangrene, not specified as recurrent: Secondary | ICD-10-CM | POA: Diagnosis not present

## 2019-06-09 LAB — CBC
HCT: 38.4 % — ABNORMAL LOW (ref 39.0–52.0)
Hemoglobin: 11.7 g/dL — ABNORMAL LOW (ref 13.0–17.0)
MCH: 19.7 pg — ABNORMAL LOW (ref 26.0–34.0)
MCHC: 30.5 g/dL (ref 30.0–36.0)
MCV: 64.5 fL — ABNORMAL LOW (ref 80.0–100.0)
Platelets: 159 10*3/uL (ref 150–400)
RBC: 5.95 MIL/uL — ABNORMAL HIGH (ref 4.22–5.81)
RDW: 18.4 % — ABNORMAL HIGH (ref 11.5–15.5)
WBC: 5.2 10*3/uL (ref 4.0–10.5)
nRBC: 0 % (ref 0.0–0.2)

## 2019-06-09 LAB — URINALYSIS, ROUTINE W REFLEX MICROSCOPIC
Bilirubin Urine: NEGATIVE
Glucose, UA: NEGATIVE mg/dL
Hgb urine dipstick: NEGATIVE
Ketones, ur: NEGATIVE mg/dL
Leukocytes,Ua: NEGATIVE
Nitrite: NEGATIVE
Protein, ur: NEGATIVE mg/dL
Specific Gravity, Urine: 1.014 (ref 1.005–1.030)
pH: 5 (ref 5.0–8.0)

## 2019-06-09 LAB — BASIC METABOLIC PANEL
Anion gap: 6 (ref 5–15)
BUN: 14 mg/dL (ref 8–23)
CO2: 29 mmol/L (ref 22–32)
Calcium: 9.2 mg/dL (ref 8.9–10.3)
Chloride: 105 mmol/L (ref 98–111)
Creatinine, Ser: 1.2 mg/dL (ref 0.61–1.24)
GFR calc Af Amer: >60 mL/min (ref >60)
GFR calc non Af Amer: 59 mL/min — ABNORMAL LOW (ref >60)
Glucose, Bld: 92 mg/dL (ref 70–99)
Potassium: 4.1 mmol/L (ref 3.5–5.1)
Sodium: 140 mmol/L (ref 135–145)

## 2019-06-09 NOTE — ED Provider Notes (Signed)
[facility] DEPT Provider Note   CSN: [id number] Arrival date & time: 06/09/19  1929     History   Chief Complaint Chief Complaint  Patient presents with   Flank Pain    HPI Shane [name] is a 73 y.o. male.     HPI Patient presents concern of left flank pain. Onset was today, relatively sudden about 4 hours ago. Since onset symptoms have improved, they are still some discomfort in the area just superior to the left hip, laterally.  Pain is moderate, nonradiating, no associated dysuria, hematuria, nausea, vomiting.  With some consideration of food related pain, the patient has not eaten anything since that time. He has multiple medical problems, states that he is been doing generally well, however. He does have history of kidney stone in the distant past.  Past Medical History:  Diagnosis Date   Acute asthmatic bronchitis    Anemia    Anxiety disorder    Borderline diabetes mellitus    Borderline hypertension    Degenerative joint disease    Depression    GERD (gastroesophageal reflux disease)    History of adenomatous polyp of colon    History of sinusitis    Posttraumatic stress disorder    Sleep apnea    Uses CPAP    Patient Active Problem List   Diagnosis Date Noted   Acute deep vein thrombosis (DVT) of proximal vein of both lower extremities ([place]) 03/01/2019   Bradycardia 07/30/2018   Sleep apnea 07/30/2018   Murmur 04/30/2018   Dizziness 04/30/2018   Palpitations 04/06/2016   Elevated blood pressure reading without diagnosis of hypertension 01/12/2016   Sinusitis, chronic 12/19/2015   Pain in joint, upper arm 11/23/2015   Numbness and tingling in left arm 11/23/2015   Asthmatic bronchitis 02/21/2015   PTSD (post-traumatic stress disorder) 02/21/2015   Hemorrhoids 06/15/2014   Anemia 03/31/2008   GERD 03/31/2008   Impaired fasting glucose 03/31/2008    Past Surgical History:   Procedure Laterality Date   COLONOSCOPY     FINGER SURGERY Right    index   NASAL SINUS SURGERY     x 2   POLYPECTOMY          Home Medications    Prior to Admission medications   Medication Sig Start Date End Date Taking? Authorizing Provider  albuterol (PROAIR HFA) 108 (90 Base) MCG/ACT inhaler Inhale 2 puffs into the lungs every 6 (six) hours as needed for wheezing or shortness of breath. 08/18/18   Kozlow, Donnamarie Poag, MD  BREO ELLIPTA 200-25 MCG/INH AEPB TAKE 1 PUFF BY MOUTH EVERY DAY Patient taking differently: Inhale 1 puff into the lungs daily.  12/14/18   Kozlow, Donnamarie Poag, MD  esomeprazole (NEXIUM) 40 MG capsule Take 1 capsule (40 mg total) by mouth 2 (two) times daily before a meal. 08/18/18 03/16/19  Kozlow, Donnamarie Poag, MD  ketoconazole (NIZORAL) 2 % shampoo Apply 1 application topically 2 (two) times a week.     [provider]  montelukast (SINGULAIR) 10 MG tablet Take 1 tablet (10 mg total) by mouth at bedtime. 03/16/19   Kozlow, Donnamarie Poag, MD  Propylene Glycol (SYSTANE BALANCE OP) Apply 1 drop to eye daily as needed (dry eyes). Reported on 12/19/2015    [provider]  rivaroxaban (XARELTO) 20 MG TABS tablet Take 1 tablet (20 mg total) by mouth daily with supper. 03/01/19   [name], MD  triamcinolone (NASACORT ALLERGY 24HR) 55 MCG/ACT AERO nasal  inhaler Use one spray in each nostril once daily as directed. Patient taking differently: Place 1 spray into the nose daily.  01/28/17   Kozlow, Donnamarie Poag, MD    Family History Family History  Problem Relation Age of Onset   Diabetes Father    Arthritis Mother        Unknown cause of death   Colon cancer Neg Hx    Colon polyps Neg Hx    Rectal cancer Neg Hx    Stomach cancer Neg Hx     Social History Social History   Tobacco Use   Smoking status: Former Smoker    Packs/day: 0.50    Years: 25.00    Pack years: 12.50    Types: Cigarettes    Quit date: 07/02/1983    Years since quitting: 35.9    Smokeless tobacco: Never Used  Substance Use Topics   Alcohol use: No   Drug use: No     Allergies   Patient has no known allergies.   Review of Systems Review of Systems  Constitutional:       Per HPI, otherwise negative  HENT:       Per HPI, otherwise negative  Respiratory:       Per HPI, otherwise negative  Cardiovascular:       Per HPI, otherwise negative  Gastrointestinal: Negative for nausea and vomiting.  Endocrine:       Negative aside from HPI  Genitourinary:       Neg aside from HPI   Musculoskeletal:       Per HPI, otherwise negative  Skin: Negative.   Neurological: Negative for syncope and weakness.     Physical Exam Updated Vital Signs BP (!) 184/103 (BP Location: Left Arm)    Pulse 75    Temp 98.5 F (36.9 C) (Oral)    Resp 18    SpO2 100%   Physical Exam Vitals signs and nursing note reviewed.  Constitutional:      General: He is not in acute distress.    Appearance: He is well-developed.  HENT:     Head: Normocephalic and atraumatic.  Eyes:     Conjunctiva/sclera: Conjunctivae normal.  Cardiovascular:     Rate and Rhythm: Normal rate and regular rhythm.  Pulmonary:     Effort: Pulmonary effort is normal. No respiratory distress.     Breath sounds: No stridor.  Abdominal:     General: There is no distension.     Tenderness: There is no abdominal tenderness. There is no guarding.    Skin:    General: Skin is warm and dry.  Neurological:     Mental Status: He is alert and oriented to person, place, and time.      ED Treatments / Results  Labs (all labs ordered are listed, but only abnormal results are displayed) Labs Reviewed  BASIC METABOLIC PANEL - Abnormal; Notable for the following components:      Result Value   GFR calc non Af Amer 59 (*)    All other components within normal limits  CBC - Abnormal; Notable for the following components:   RBC 5.95 (*)    Hemoglobin 11.7 (*)    HCT 38.4 (*)    MCV 64.5 (*)    MCH  19.7 (*)    RDW 18.4 (*)    All other components within normal limits  URINE CULTURE  URINALYSIS, ROUTINE W REFLEX MICROSCOPIC    EKG None  Radiology Ct Renal Stone  Study  Result Date: 06/10/2019 CLINICAL DATA:  Flank pain with stone disease suspected. EXAM: CT ABDOMEN AND PELVIS WITHOUT CONTRAST TECHNIQUE: Multidetector CT imaging of the abdomen and pelvis was performed following the standard protocol without IV contrast. COMPARISON:  10/24/2018 FINDINGS: Lower chest: The lung bases are clear. The heart size is normal. Hepatobiliary: The liver is normal. Normal gallbladder.There is no biliary ductal dilation. Pancreas: Normal contours without ductal dilatation. No peripancreatic fluid collection. Spleen: No splenic laceration or hematoma. Adrenals/Urinary Tract: --Adrenal glands: No adrenal hemorrhage. --Right kidney/ureter: No hydronephrosis or perinephric hematoma. --Left kidney/ureter: There is a possible punctate stone in the interpolar region of the left kidney. --Urinary bladder: Unremarkable. Stomach/Bowel: --Stomach/Duodenum: No hiatal hernia or other gastric abnormality. Normal duodenal course and caliber. --Small bowel: No dilatation or inflammation. --Colon: No focal abnormality. --Appendix: Normal. Vascular/Lymphatic: Atherosclerotic calcification is present within the non-aneurysmal abdominal aorta, without hemodynamically significant stenosis. --No retroperitoneal lymphadenopathy. --No mesenteric lymphadenopathy. --No pelvic or inguinal lymphadenopathy. Reproductive: The prostate gland is enlarged. Other: No ascites or free air. There is a fat containing left inguinal hernia. Musculoskeletal. No acute displaced fractures. IMPRESSION: 1. No acute abdominopelvic abnormality. 2. Fat containing left inguinal hernia. 3. Prostatomegaly. 4. Possible punctate nonobstructing stone in the left kidney. Aortic Atherosclerosis (ICD10-I70.0). Electronically Signed   By: Constance Holster M.D.   On:  06/10/2019 00:31    Procedures Procedures (including critical care time)  Medications Ordered in ED Medications - No data to display   Initial Impression / Assessment and Plan / ED Course  I have reviewed the triage vital signs and the nursing notes.  Pertinent labs & imaging results that were available during my care of the patient were reviewed by me and considered in my medical decision making (see chart for details).        12:46 AM On repeat exam patient is awake, alert, in no distress. He had 1 brief episode of pain while here, transferring to the gurney for CT scan. Currently has no ongoing complaints.  I we discussed all findings, including reassuring CT scan, labs, no evidence for acute abdominal processes requiring admission, surgery. Some suspicion for gas versus other GI etiology given the episodic nature, reassuring CT scan, though passed kidney stone and arthritis or other considerations. Patient amenable to discharge has a primary care physician with whom he follow-up, and already has a visit scheduled in the near future.   Final Clinical Impressions(s) / ED Diagnoses   Final diagnoses:  Left flank pain    ED Discharge Orders         Ordered    alum & mag hydroxide-simeth [facility] MAXIMUM STRENGTH) [id number] MG/5ML suspension  Every 6 hours PRN     06/10/19 0049           Carmin Muskrat, MD 06/10/19 [phone]

## 2019-06-09 NOTE — ED Triage Notes (Signed)
Patient here from home with complaints of left side flank pain radiating around to abd. Denies n/v.

## 2019-06-10 ENCOUNTER — Emergency Department (HOSPITAL_COMMUNITY): Payer: PPO

## 2019-06-10 DIAGNOSIS — K4090 Unilateral inguinal hernia, without obstruction or gangrene, not specified as recurrent: Secondary | ICD-10-CM | POA: Diagnosis not present

## 2019-06-10 LAB — URINE CULTURE: Culture: NO GROWTH

## 2019-06-10 MED ORDER — ALUM & MAG HYDROXIDE-SIMETH 400-400-40 MG/5ML PO SUSP: 5.0000 mL | Freq: Four times a day (QID) | ORAL | 0 refills | Status: AC | PRN

## 2019-06-10 NOTE — ED Notes (Signed)
Patient came out into the hall and asked this RN if he could change into his clothes. This RN told him he could change and that she would go into the room to give him his discharge paperwork in a few minutes. Patient left before this RN was able to return to the patient's room.

## 2019-06-10 NOTE — Discharge Instructions (Addendum)
As discussed, your evaluation today has been largely reassuring.  But, it is important that you monitor your condition carefully, and do not hesitate to return to the ED if you develop new, or concerning changes in your condition.  Otherwise, please follow-up with your physician for appropriate ongoing care.  In addition to your previously prescribed medication, please use Mylanta, daily for the next week.

## 2019-06-15 ENCOUNTER — Ambulatory Visit: Payer: PPO | Admitting: Allergy and Immunology

## 2019-08-23 ENCOUNTER — Other Ambulatory Visit: Payer: Self-pay | Admitting: Allergy and Immunology

## 2019-08-29 ENCOUNTER — Ambulatory Visit: Payer: PPO | Attending: Internal Medicine

## 2019-08-29 DIAGNOSIS — Z23 Encounter for immunization: Secondary | ICD-10-CM | POA: Insufficient documentation

## 2019-08-29 NOTE — Progress Notes (Signed)
   Covid-19 Vaccination Clinic  Name:  Shane [name]    MRN: [id number] DOB: October 13, 1945  08/29/2019  Mr. [name] was observed post Covid-19 immunization for 30 minutes based on pre-vaccination screening without incidence. He was provided with Vaccine Information Sheet and instruction to access the V-Safe system.   Mr. [name] was instructed to call 911 with any severe reactions post vaccine: Marland Kitchen Difficulty breathing  . Swelling of your face and throat  . A fast heartbeat  . A bad rash all over your body  . Dizziness and weakness    Immunizations Administered    Name Date Dose VIS Date Route   Pfizer COVID-19 Vaccine 08/29/2019  4:30 PM 0.3 mL 06/11/2019 Intramuscular   Manufacturer: [place]   Lot: [id number]   [place]: [id number]

## 2019-09-08 ENCOUNTER — Other Ambulatory Visit: Payer: Self-pay | Admitting: Allergy and Immunology

## 2019-09-28 ENCOUNTER — Ambulatory Visit: Payer: PPO | Attending: Internal Medicine

## 2019-09-28 DIAGNOSIS — Z23 Encounter for immunization: Secondary | ICD-10-CM

## 2019-09-28 NOTE — Progress Notes (Signed)
   Covid-19 Vaccination Clinic  Name:  MARQUESS BORRIELLO    MRN: [id number] DOB: 12-14-1945  09/28/2019  Mr. Calcagni was observed post Covid-19 immunization for 15 minutes without incident. He was provided with Vaccine Information Sheet and instruction to access the V-Safe system.   Mr. Herz was instructed to call 911 with any severe reactions post vaccine: Marland Kitchen Difficulty breathing  . Swelling of face and throat  . A fast heartbeat  . A bad rash all over body  . Dizziness and weakness   Immunizations Administered    Name Date Dose VIS Date Route   Pfizer COVID-19 Vaccine 09/28/2019  2:59 PM 0.3 mL 06/11/2019 Intramuscular   Manufacturer: [place]   Lot: [id number]   [place]: [id number]

## 2019-10-07 ENCOUNTER — Other Ambulatory Visit: Payer: Self-pay | Admitting: Allergy and Immunology

## 2019-12-08 ENCOUNTER — Telehealth: Payer: Self-pay | Admitting: Allergy and Immunology

## 2019-12-08 NOTE — Telephone Encounter (Signed)
Patient is doing a televisit with Shane [name]  on 12/09/2019

## 2019-12-08 NOTE — Telephone Encounter (Signed)
Patient states he is having chest congestion and wants to know if he can have medicine called in or if he needs to have an office visit scheduled

## 2019-12-08 NOTE — Telephone Encounter (Signed)
Chest congestion started last week, and has taken his albuterol for his chest congestion, Breo daily, otc sinus medication. Patient has not tried Mucinex or allergy medications for his congestion. Patient is not experiencing any type of flu like symptoms nor is having any SOB. He is coughing up phlegm that is light green. Patient needs to know if he needs to be seen or if he can get any type medication.

## 2019-12-09 ENCOUNTER — Ambulatory Visit (INDEPENDENT_AMBULATORY_CARE_PROVIDER_SITE_OTHER): Payer: PPO | Admitting: Family

## 2019-12-09 ENCOUNTER — Other Ambulatory Visit: Payer: Self-pay

## 2019-12-09 ENCOUNTER — Encounter: Payer: Self-pay | Admitting: Family

## 2019-12-09 DIAGNOSIS — K219 Gastro-esophageal reflux disease without esophagitis: Secondary | ICD-10-CM | POA: Diagnosis not present

## 2019-12-09 DIAGNOSIS — J3089 Other allergic rhinitis: Secondary | ICD-10-CM | POA: Diagnosis not present

## 2019-12-09 DIAGNOSIS — J019 Acute sinusitis, unspecified: Secondary | ICD-10-CM

## 2019-12-09 DIAGNOSIS — J4540 Moderate persistent asthma, uncomplicated: Secondary | ICD-10-CM

## 2019-12-09 MED ORDER — PREDNISONE 10 MG PO TABS: ORAL_TABLET | ORAL | 0 refills | Status: DC

## 2019-12-09 MED ORDER — ESOMEPRAZOLE MAGNESIUM 40 MG PO CPDR: DELAYED_RELEASE_CAPSULE | ORAL | 1 refills | Status: DC

## 2019-12-09 MED ORDER — AMOXICILLIN-POT CLAVULANATE 875-125 MG PO TABS: 1.0000 | ORAL_TABLET | Freq: Two times a day (BID) | ORAL | 0 refills | Status: AC

## 2019-12-09 NOTE — Telephone Encounter (Signed)
Called pt for telemed appt 3x and phone with straight to voicemail. Called emergency contact (brother) who will try to get ahold of pt.

## 2019-12-09 NOTE — Telephone Encounter (Signed)
Noted. Patient scheduled for telephone visit today with me at 1:30 pm. Thank you!

## 2019-12-09 NOTE — Progress Notes (Addendum)
RE: Shane [name] MRN: [id number] DOB: Apr 25, 1946 Date of Telemedicine Visit: 12/09/2019  Referring provider: [name], * Primary care provider: [name], MD  Chief Complaint: Asthma (pt states breathing is okay occasional sob)   Telemedicine Follow Up Visit via Telephone: I connected with Shane [name] for a follow up on 12/09/19 by telephone and verified that I am speaking with the correct person using two identifiers.   I discussed the limitations, risks, security and privacy concerns of performing an evaluation and management service by telephone and the availability of in person appointments. I also discussed with the patient that there may be a patient responsible charge related to this service. The patient expressed understanding and agreed to proceed.  Patient is at home.  Provider is at the office.  Visit start time: 2:38 pm Visit end time: 3:07 pm Insurance consent/check in by: Mellody Dance Medical consent and medical assistant/nurse: Carlis Abbott.  History of Present Illness: He is a 74 y.o. male, who is being followed for moderate persistent asthma, chronic sinusitis and reflux. His previous allergy office visit was on March 16, 2019 with Dr. Neldon Mc.   Today he calls for an acute visit.  He reports he has a sinus infection.  His symptoms started a couple of weeks ago.  He reports nasal congestion and green postnasal drip that drains to his mouth that he has to spit out.  He denies any sinus tenderness, rhinorrhea, fever or chills.  He is currently using Nasacort 1 spray each nostril once a day and will use sinus rinse once a day when he has congestion.  He has not had any other antibiotics since his last office visit on March 16, 2019.  Asthma is reported as moderately controlled with the use of Breo 200 mcg 1 puff once a day, Singulair 10 mg once a day, and albuterol as needed.  He reports cough and shortness of breath that occurs every once  in a while. He denies any wheezing, tightness in his chest, nocturnal awakenings, trips to the emergency room or urgent care for asthma, or systemic steroids since his last office visit.  He is currently not taking esomeprazole because he ran out and did not get it refilled.  He reports reflux symptoms every once in a while and when he flares up he will take Mylanta.  Medications are as listed in the chart.  Assessment and Plan: [name] is a 74 y.o. male with: Patient Instructions  Acute sinus infection Start Augmentin 875 mg take 1 tablet twice a day for 10 days Start prednisone 20 mg 1 tablet once a day for 4 days  Asthma Continue Breo 200 - 1 puff once a day to help prevent cough and wheeze. Continue montelukast 10 mg once a day to help prevent cough and wheeze May use albuterol 2 puffs every 4 hours as needed for cough, wheeze, tightness in chest, or shortness of breath.  Chronic sinusitis Continue OTC Nasacort 1 spray each nostril once a day to help with nasal congestion. Continue saline nasal sprays as needed.  Please use this prior to any medicated nasal sprays.  Reflux Restart esomeprazole 40 mg- 1 capsule once a day.  Please let Shane [name] know if this treatment plan is not working well for you. Schedule follow-up appointment in 2 months.   Return in about 2 months (around 02/08/2020), or if symptoms worsen or fail to improve.  Meds ordered this encounter  Medications  . amoxicillin-clavulanate (AUGMENTIN) 875-125 MG  tablet    Sig: Take 1 tablet by mouth 2 (two) times daily for 10 days.    Dispense:  20 tablet    Refill:  0  . predniSONE (DELTASONE) 10 MG tablet    Sig: 2 tablets once a day for 4 days    Dispense:  8 tablet    Refill:  0  . esomeprazole (NEXIUM) 40 MG capsule    Sig: Take one capsule once a day    Dispense:  30 capsule    Refill:  1   Lab Orders  No laboratory test(s) ordered today    Diagnostics: None.  Medication List:  Current Outpatient  Medications  Medication Sig Dispense Refill  . albuterol (VENTOLIN HFA) 108 (90 Base) MCG/ACT inhaler TAKE 2 PUFFS BY MOUTH EVERY 6 HOURS AS NEEDED FOR WHEEZE OR SHORTNESS OF BREATH 18 g 0  . alum & mag hydroxide-simeth (MYLANTA MAXIMUM STRENGTH) 400-400-40 MG/5ML suspension Take 5 mLs by mouth every 6 (six) hours as needed (abdominal pain or indigestion). 355 mL 0  . BREO ELLIPTA 200-25 MCG/INH AEPB TAKE 1 PUFF BY MOUTH EVERY DAY (Patient taking differently: Inhale 1 puff into the lungs daily. ) 60 each 3  . ketoconazole (NIZORAL) 2 % shampoo Apply 1 application topically 2 (two) times a week.     . montelukast (SINGULAIR) 10 MG tablet TAKE 1 TABLET BY MOUTH EVERYDAY AT BEDTIME 30 tablet 0  . Propylene Glycol (SYSTANE BALANCE OP) Apply 1 drop to eye daily as needed (dry eyes). Reported on 12/19/2015    . triamcinolone (NASACORT ALLERGY 24HR) 55 MCG/ACT AERO nasal inhaler Use one spray in each nostril once daily as directed. (Patient taking differently: Place 1 spray into the nose daily. ) 1 Inhaler 5  . amoxicillin-clavulanate (AUGMENTIN) 875-125 MG tablet Take 1 tablet by mouth 2 (two) times daily for 10 days. 20 tablet 0  . esomeprazole (NEXIUM) 40 MG capsule Take 1 capsule (40 mg total) by mouth 2 (two) times daily before a meal. 180 capsule 1  . esomeprazole (NEXIUM) 40 MG capsule Take one capsule once a day 30 capsule 1  . predniSONE (DELTASONE) 10 MG tablet 2 tablets once a day for 4 days 8 tablet 0   No current facility-administered medications for this visit.   Allergies: No Known Allergies I reviewed his past medical history, social history, family history, and environmental history and no significant changes have been reported from previous visit on 03/16/2019.  Review of Systems  Constitutional: Negative for chills and fever.  HENT: Positive for congestion and postnasal drip. Negative for rhinorrhea, sinus pressure and sinus pain.   Respiratory: Negative for chest tightness and  wheezing.        Reports occasional cough and occasional shortness of breath.   Objective: Physical Exam Not obtained as encounter was done via telephone.   Previous notes and tests were reviewed.  I discussed the assessment and treatment plan with the patient. The patient was provided an opportunity to ask questions and all were answered. The patient agreed with the plan and demonstrated an understanding of the instructions.   The patient was advised to call back or seek an in-person evaluation if the symptoms worsen or if the condition fails to improve as anticipated.  I provided 29 minutes of non-face-to-face time during this encounter.  It was my pleasure to participate in [place] care today. Please feel free to contact me with any questions or concerns.   Sincerely,  Althea Charon, FNP  ________________________________________________  I have provided oversight concerning [facility]'s evaluation and treatment of this patient's health issues addressed during today's encounter.  I agree with the assessment and therapeutic plan as outlined in the note.   Signed,   R Edgar Frisk, MD

## 2019-12-09 NOTE — Patient Instructions (Addendum)
Acute sinus infection Start Augmentin 875 mg take 1 tablet twice a day for 10 days Start prednisone 20 mg 1 tablet once a day for 4 days  Asthma Continue Breo 200 - 1 puff once a day to help prevent cough and wheeze. Continue montelukast 10 mg once a day to help prevent cough and wheeze May use albuterol 2 puffs every 4 hours as needed for cough, wheeze, tightness in chest, or shortness of breath.  Chronic sinusitis Continue OTC Nasacort 1 spray each nostril once a day to help with nasal congestion. Continue saline nasal sprays as needed.  Please use this prior to any medicated nasal sprays.  Reflux Restart esomeprazole 40 mg- 1 capsule once a day.  Please let us know if this treatment plan is not working well for you. Schedule follow-up appointment in 2 months.

## 2019-12-21 ENCOUNTER — Other Ambulatory Visit: Payer: Self-pay | Admitting: Allergy and Immunology

## 2019-12-24 ENCOUNTER — Other Ambulatory Visit: Payer: Self-pay

## 2019-12-24 DIAGNOSIS — Z86010 Personal history of colonic polyps: Secondary | ICD-10-CM

## 2019-12-24 DIAGNOSIS — Z87891 Personal history of nicotine dependence: Secondary | ICD-10-CM | POA: Diagnosis not present

## 2019-12-24 DIAGNOSIS — J4540 Moderate persistent asthma, uncomplicated: Secondary | ICD-10-CM | POA: Diagnosis present

## 2019-12-24 DIAGNOSIS — I10 Essential (primary) hypertension: Secondary | ICD-10-CM | POA: Diagnosis not present

## 2019-12-24 DIAGNOSIS — I249 Acute ischemic heart disease, unspecified: Secondary | ICD-10-CM | POA: Diagnosis not present

## 2019-12-24 DIAGNOSIS — I160 Hypertensive urgency: Secondary | ICD-10-CM | POA: Diagnosis not present

## 2019-12-24 DIAGNOSIS — I82411 Acute embolism and thrombosis of right femoral vein: Secondary | ICD-10-CM | POA: Diagnosis not present

## 2019-12-24 DIAGNOSIS — D569 Thalassemia, unspecified: Secondary | ICD-10-CM | POA: Diagnosis not present

## 2019-12-24 DIAGNOSIS — G4733 Obstructive sleep apnea (adult) (pediatric): Secondary | ICD-10-CM | POA: Diagnosis present

## 2019-12-24 DIAGNOSIS — J329 Chronic sinusitis, unspecified: Secondary | ICD-10-CM | POA: Diagnosis not present

## 2019-12-24 DIAGNOSIS — M549 Dorsalgia, unspecified: Secondary | ICD-10-CM | POA: Diagnosis not present

## 2019-12-24 DIAGNOSIS — Z8261 Family history of arthritis: Secondary | ICD-10-CM | POA: Diagnosis not present

## 2019-12-24 DIAGNOSIS — F4310 Post-traumatic stress disorder, unspecified: Secondary | ICD-10-CM | POA: Diagnosis present

## 2019-12-24 DIAGNOSIS — N179 Acute kidney failure, unspecified: Secondary | ICD-10-CM | POA: Diagnosis present

## 2019-12-24 DIAGNOSIS — D509 Iron deficiency anemia, unspecified: Secondary | ICD-10-CM | POA: Diagnosis not present

## 2019-12-24 DIAGNOSIS — Z79899 Other long term (current) drug therapy: Secondary | ICD-10-CM | POA: Diagnosis not present

## 2019-12-24 DIAGNOSIS — Z833 Family history of diabetes mellitus: Secondary | ICD-10-CM

## 2019-12-24 DIAGNOSIS — I2510 Atherosclerotic heart disease of native coronary artery without angina pectoris: Secondary | ICD-10-CM | POA: Diagnosis present

## 2019-12-24 DIAGNOSIS — I77810 Thoracic aortic ectasia: Secondary | ICD-10-CM | POA: Diagnosis not present

## 2019-12-24 DIAGNOSIS — I2584 Coronary atherosclerosis due to calcified coronary lesion: Secondary | ICD-10-CM | POA: Diagnosis not present

## 2019-12-24 DIAGNOSIS — I700 Atherosclerosis of aorta: Secondary | ICD-10-CM | POA: Diagnosis not present

## 2019-12-24 DIAGNOSIS — R072 Precordial pain: Secondary | ICD-10-CM | POA: Diagnosis not present

## 2019-12-24 DIAGNOSIS — K5641 Fecal impaction: Secondary | ICD-10-CM | POA: Diagnosis not present

## 2019-12-24 DIAGNOSIS — R0789 Other chest pain: Secondary | ICD-10-CM | POA: Diagnosis not present

## 2019-12-24 DIAGNOSIS — I371 Nonrheumatic pulmonary valve insufficiency: Secondary | ICD-10-CM | POA: Diagnosis not present

## 2019-12-24 DIAGNOSIS — Z20822 Contact with and (suspected) exposure to covid-19: Secondary | ICD-10-CM | POA: Diagnosis not present

## 2019-12-24 DIAGNOSIS — I361 Nonrheumatic tricuspid (valve) insufficiency: Secondary | ICD-10-CM | POA: Diagnosis not present

## 2019-12-24 DIAGNOSIS — I708 Atherosclerosis of other arteries: Secondary | ICD-10-CM | POA: Diagnosis not present

## 2019-12-24 DIAGNOSIS — K219 Gastro-esophageal reflux disease without esophagitis: Secondary | ICD-10-CM | POA: Diagnosis present

## 2019-12-24 DIAGNOSIS — R079 Chest pain, unspecified: Secondary | ICD-10-CM | POA: Diagnosis not present

## 2019-12-24 DIAGNOSIS — I161 Hypertensive emergency: Secondary | ICD-10-CM | POA: Diagnosis not present

## 2019-12-24 DIAGNOSIS — J324 Chronic pansinusitis: Secondary | ICD-10-CM | POA: Diagnosis not present

## 2019-12-24 DIAGNOSIS — M2578 Osteophyte, vertebrae: Secondary | ICD-10-CM | POA: Diagnosis not present

## 2019-12-25 ENCOUNTER — Inpatient Hospital Stay (HOSPITAL_COMMUNITY)
Admission: AD | Admit: 2019-12-25 | Discharge: 2019-12-27 | DRG: 305 | Disposition: A | Discharge status: Home / Self Care | Payer: PPO | Attending: Family Medicine | Admitting: Family Medicine

## 2019-12-25 ENCOUNTER — Inpatient Hospital Stay (HOSPITAL_COMMUNITY): Payer: PPO

## 2019-12-25 ENCOUNTER — Other Ambulatory Visit: Payer: Self-pay

## 2019-12-25 ENCOUNTER — Emergency Department (HOSPITAL_COMMUNITY): Payer: PPO

## 2019-12-25 ENCOUNTER — Encounter (HOSPITAL_COMMUNITY): Payer: Self-pay

## 2019-12-25 DIAGNOSIS — R072 Precordial pain: Secondary | ICD-10-CM | POA: Diagnosis not present

## 2019-12-25 DIAGNOSIS — G4733 Obstructive sleep apnea (adult) (pediatric): Secondary | ICD-10-CM | POA: Diagnosis present

## 2019-12-25 DIAGNOSIS — R0789 Other chest pain: Secondary | ICD-10-CM | POA: Diagnosis not present

## 2019-12-25 DIAGNOSIS — Z86010 Personal history of colonic polyps: Secondary | ICD-10-CM | POA: Diagnosis not present

## 2019-12-25 DIAGNOSIS — Z87891 Personal history of nicotine dependence: Secondary | ICD-10-CM | POA: Diagnosis not present

## 2019-12-25 DIAGNOSIS — D509 Iron deficiency anemia, unspecified: Secondary | ICD-10-CM

## 2019-12-25 DIAGNOSIS — I700 Atherosclerosis of aorta: Secondary | ICD-10-CM | POA: Diagnosis present

## 2019-12-25 DIAGNOSIS — J4540 Moderate persistent asthma, uncomplicated: Secondary | ICD-10-CM | POA: Diagnosis present

## 2019-12-25 DIAGNOSIS — K219 Gastro-esophageal reflux disease without esophagitis: Secondary | ICD-10-CM | POA: Diagnosis present

## 2019-12-25 DIAGNOSIS — I249 Acute ischemic heart disease, unspecified: Secondary | ICD-10-CM

## 2019-12-25 DIAGNOSIS — I371 Nonrheumatic pulmonary valve insufficiency: Secondary | ICD-10-CM | POA: Diagnosis not present

## 2019-12-25 DIAGNOSIS — I82411 Acute embolism and thrombosis of right femoral vein: Secondary | ICD-10-CM | POA: Diagnosis present

## 2019-12-25 DIAGNOSIS — I2510 Atherosclerotic heart disease of native coronary artery without angina pectoris: Secondary | ICD-10-CM | POA: Diagnosis present

## 2019-12-25 DIAGNOSIS — Z8261 Family history of arthritis: Secondary | ICD-10-CM | POA: Diagnosis not present

## 2019-12-25 DIAGNOSIS — I160 Hypertensive urgency: Secondary | ICD-10-CM | POA: Diagnosis present

## 2019-12-25 DIAGNOSIS — R079 Chest pain, unspecified: Secondary | ICD-10-CM

## 2019-12-25 DIAGNOSIS — Z833 Family history of diabetes mellitus: Secondary | ICD-10-CM | POA: Diagnosis not present

## 2019-12-25 DIAGNOSIS — I161 Hypertensive emergency: Secondary | ICD-10-CM | POA: Diagnosis present

## 2019-12-25 DIAGNOSIS — R001 Bradycardia, unspecified: Secondary | ICD-10-CM | POA: Diagnosis present

## 2019-12-25 DIAGNOSIS — N179 Acute kidney failure, unspecified: Secondary | ICD-10-CM

## 2019-12-25 DIAGNOSIS — F4310 Post-traumatic stress disorder, unspecified: Secondary | ICD-10-CM | POA: Diagnosis present

## 2019-12-25 DIAGNOSIS — J324 Chronic pansinusitis: Secondary | ICD-10-CM

## 2019-12-25 DIAGNOSIS — I2584 Coronary atherosclerosis due to calcified coronary lesion: Secondary | ICD-10-CM | POA: Diagnosis not present

## 2019-12-25 DIAGNOSIS — Z79899 Other long term (current) drug therapy: Secondary | ICD-10-CM | POA: Diagnosis not present

## 2019-12-25 DIAGNOSIS — J329 Chronic sinusitis, unspecified: Secondary | ICD-10-CM | POA: Diagnosis present

## 2019-12-25 DIAGNOSIS — I361 Nonrheumatic tricuspid (valve) insufficiency: Secondary | ICD-10-CM

## 2019-12-25 DIAGNOSIS — Z20822 Contact with and (suspected) exposure to covid-19: Secondary | ICD-10-CM | POA: Diagnosis present

## 2019-12-25 DIAGNOSIS — I10 Essential (primary) hypertension: Secondary | ICD-10-CM | POA: Diagnosis present

## 2019-12-25 DIAGNOSIS — D569 Thalassemia, unspecified: Secondary | ICD-10-CM | POA: Diagnosis present

## 2019-12-25 LAB — COMPREHENSIVE METABOLIC PANEL
ALT: 14 U/L (ref 0–44)
AST: 28 U/L (ref 15–41)
Albumin: 4.9 g/dL (ref 3.5–5.0)
Alkaline Phosphatase: 96 U/L (ref 38–126)
Anion gap: 12 (ref 5–15)
BUN: 17 mg/dL (ref 8–23)
CO2: 26 mmol/L (ref 22–32)
Calcium: 9.1 mg/dL (ref 8.9–10.3)
Chloride: 96 mmol/L — ABNORMAL LOW (ref 98–111)
Creatinine, Ser: 1.19 mg/dL (ref 0.61–1.24)
GFR calc Af Amer: >60 mL/min (ref >60)
GFR calc non Af Amer: 59 mL/min — ABNORMAL LOW (ref >60)
Glucose, Bld: 149 mg/dL — ABNORMAL HIGH (ref 70–99)
Potassium: 3.8 mmol/L (ref 3.5–5.1)
Sodium: 134 mmol/L — ABNORMAL LOW (ref 135–145)
Total Bilirubin: 0.8 mg/dL (ref 0.3–1.2)
Total Protein: 8.3 g/dL — ABNORMAL HIGH (ref 6.5–8.1)

## 2019-12-25 LAB — IRON AND TIBC
Iron: 68 ug/dL (ref 45–182)
Saturation Ratios: 15 % — ABNORMAL LOW (ref 17.9–39.5)
TIBC: 449 ug/dL (ref 250–450)
UIBC: 381 ug/dL

## 2019-12-25 LAB — ECHOCARDIOGRAM COMPLETE
Height: 74 in
Weight: 4320 oz

## 2019-12-25 LAB — URINALYSIS, ROUTINE W REFLEX MICROSCOPIC
Bilirubin Urine: NEGATIVE
Glucose, UA: NEGATIVE mg/dL
Hgb urine dipstick: NEGATIVE
Ketones, ur: 5 mg/dL — AB
Leukocytes,Ua: NEGATIVE
Nitrite: NEGATIVE
Protein, ur: NEGATIVE mg/dL
Specific Gravity, Urine: 1.009 (ref 1.005–1.030)
pH: 7 (ref 5.0–8.0)

## 2019-12-25 LAB — LIPASE, BLOOD: Lipase: 22 U/L (ref 11–51)

## 2019-12-25 LAB — CBC
HCT: 40.2 % (ref 39.0–52.0)
Hemoglobin: 12.5 g/dL — ABNORMAL LOW (ref 13.0–17.0)
MCH: 19.9 pg — ABNORMAL LOW (ref 26.0–34.0)
MCHC: 31.1 g/dL (ref 30.0–36.0)
MCV: 64.1 fL — ABNORMAL LOW (ref 80.0–100.0)
Platelets: 163 10*3/uL (ref 150–400)
RBC: 6.27 MIL/uL — ABNORMAL HIGH (ref 4.22–5.81)
RDW: 18.1 % — ABNORMAL HIGH (ref 11.5–15.5)
WBC: 6.3 10*3/uL (ref 4.0–10.5)
nRBC: 0 % (ref 0.0–0.2)

## 2019-12-25 LAB — BRAIN NATRIURETIC PEPTIDE: B Natriuretic Peptide: 86 pg/mL (ref 0.0–100.0)

## 2019-12-25 LAB — RAPID URINE DRUG SCREEN, HOSP PERFORMED
Amphetamines: NOT DETECTED
Barbiturates: NOT DETECTED
Benzodiazepines: NOT DETECTED
Cocaine: NOT DETECTED
Opiates: NOT DETECTED
Tetrahydrocannabinol: NOT DETECTED

## 2019-12-25 LAB — SARS CORONAVIRUS 2 BY RT PCR (HOSPITAL ORDER, PERFORMED IN ~~LOC~~ HOSPITAL LAB): SARS Coronavirus 2: NEGATIVE

## 2019-12-25 LAB — FERRITIN: Ferritin: 105 ng/mL (ref 24–336)

## 2019-12-25 LAB — TROPONIN I (HIGH SENSITIVITY): Troponin I (High Sensitivity): 16 ng/L (ref <18)

## 2019-12-25 LAB — MRSA PCR SCREENING: MRSA by PCR: NEGATIVE

## 2019-12-25 MED ORDER — MONTELUKAST SODIUM 10 MG PO TABS
10.0000 mg | ORAL_TABLET | Freq: Every day | ORAL | Status: DC
  Administered 2019-12-25 – 2019-12-26 (×2): 10 mg via ORAL
  Filled 2019-12-25 (×2): qty 1

## 2019-12-25 MED ORDER — FUROSEMIDE 10 MG/ML IJ SOLN
40.0000 mg | Freq: Once | INTRAMUSCULAR | Status: AC
  Administered 2019-12-25: 40 mg via INTRAVENOUS
  Filled 2019-12-25: qty 4

## 2019-12-25 MED ORDER — PANTOPRAZOLE SODIUM 40 MG PO TBEC
40.0000 mg | DELAYED_RELEASE_TABLET | Freq: Every day | ORAL | Status: DC
  Administered 2019-12-25 – 2019-12-27 (×3): 40 mg via ORAL
  Filled 2019-12-25 (×3): qty 1

## 2019-12-25 MED ORDER — FLUTICASONE FUROATE-VILANTEROL 200-25 MCG/INH IN AEPB
1.0000 | INHALATION_SPRAY | Freq: Every day | RESPIRATORY_TRACT | Status: DC
  Administered 2019-12-25 – 2019-12-27 (×3): 1 via RESPIRATORY_TRACT
  Filled 2019-12-25: qty 28

## 2019-12-25 MED ORDER — ACETAMINOPHEN 650 MG RE SUPP: 650.0000 mg | Freq: Four times a day (QID) | RECTAL | Status: DC | PRN

## 2019-12-25 MED ORDER — AMLODIPINE BESYLATE 10 MG PO TABS
10.0000 mg | ORAL_TABLET | Freq: Every day | ORAL | Status: DC
  Administered 2019-12-25 – 2019-12-27 (×3): 10 mg via ORAL
  Filled 2019-12-25 (×4): qty 1

## 2019-12-25 MED ORDER — ALBUTEROL SULFATE (2.5 MG/3ML) 0.083% IN NEBU: 2.5000 mg | INHALATION_SOLUTION | Freq: Four times a day (QID) | RESPIRATORY_TRACT | Status: DC | PRN

## 2019-12-25 MED ORDER — NITROGLYCERIN 2 % TD OINT
1.0000 [in_us] | TOPICAL_OINTMENT | Freq: Once | TRANSDERMAL | Status: AC
  Administered 2019-12-25: 1 [in_us] via TOPICAL
  Filled 2019-12-25: qty 30

## 2019-12-25 MED ORDER — ONDANSETRON HCL 4 MG/2ML IJ SOLN: 4.0000 mg | Freq: Four times a day (QID) | INTRAMUSCULAR | Status: DC | PRN

## 2019-12-25 MED ORDER — ONDANSETRON HCL 4 MG PO TABS: 4.0000 mg | ORAL_TABLET | Freq: Four times a day (QID) | ORAL | Status: DC | PRN

## 2019-12-25 MED ORDER — SODIUM CHLORIDE (PF) 0.9 % IJ SOLN
INTRAMUSCULAR | Status: AC
  Filled 2019-12-25: qty 50

## 2019-12-25 MED ORDER — ACETAMINOPHEN 325 MG PO TABS
650.0000 mg | ORAL_TABLET | Freq: Four times a day (QID) | ORAL | Status: DC | PRN
  Administered 2019-12-26 (×2): 650 mg via ORAL
  Filled 2019-12-25 (×2): qty 2

## 2019-12-25 MED ORDER — SALINE SPRAY 0.65 % NA SOLN: 1.0000 | NASAL | Status: DC | PRN

## 2019-12-25 MED ORDER — AMLODIPINE BESYLATE 10 MG PO TABS: 10.0000 mg | ORAL_TABLET | Freq: Every day | ORAL | Status: DC

## 2019-12-25 MED ORDER — SENNOSIDES-DOCUSATE SODIUM 8.6-50 MG PO TABS: 1.0000 | ORAL_TABLET | Freq: Every evening | ORAL | Status: DC | PRN

## 2019-12-25 MED ORDER — PROPYLENE GLYCOL 0.6 % OP SOLN: Freq: Every day | OPHTHALMIC | Status: DC | PRN

## 2019-12-25 MED ORDER — OXYCODONE HCL 5 MG PO TABS
5.0000 mg | ORAL_TABLET | ORAL | Status: DC | PRN
  Administered 2019-12-25 – 2019-12-26 (×6): 5 mg via ORAL
  Filled 2019-12-25 (×6): qty 1

## 2019-12-25 MED ORDER — ASPIRIN 81 MG PO CHEW
324.0000 mg | CHEWABLE_TABLET | Freq: Once | ORAL | Status: AC
  Administered 2019-12-25: 324 mg via ORAL
  Filled 2019-12-25: qty 4

## 2019-12-25 MED ORDER — TRIAMCINOLONE ACETONIDE 55 MCG/ACT NA AERO
1.0000 | INHALATION_SPRAY | Freq: Every day | NASAL | Status: DC
  Administered 2019-12-25 – 2019-12-27 (×3): 1 via NASAL
  Filled 2019-12-25: qty 10.8

## 2019-12-25 MED ORDER — RIVAROXABAN 20 MG PO TABS
20.0000 mg | ORAL_TABLET | Freq: Every day | ORAL | Status: DC
  Administered 2019-12-25: 20 mg via ORAL
  Filled 2019-12-25: qty 1

## 2019-12-25 MED ORDER — IOHEXOL 350 MG/ML SOLN
100.0000 mL | Freq: Once | INTRAVENOUS | Status: AC | PRN
  Administered 2019-12-25: 100 mL via INTRAVENOUS

## 2019-12-25 MED ORDER — NITROGLYCERIN IN D5W 200-5 MCG/ML-% IV SOLN
0.0000 ug/min | INTRAVENOUS | Status: DC
  Administered 2019-12-25: 5 ug/min via INTRAVENOUS
  Filled 2019-12-25 (×2): qty 250

## 2019-12-25 MED ORDER — GUAIFENESIN ER 600 MG PO TB12: 600.0000 mg | ORAL_TABLET | Freq: Two times a day (BID) | ORAL | Status: DC | PRN

## 2019-12-25 MED ORDER — POLYVINYL ALCOHOL 1.4 % OP SOLN
1.0000 [drp] | Freq: Every day | OPHTHALMIC | Status: DC | PRN
  Filled 2019-12-25: qty 15

## 2019-12-25 MED ORDER — CHLORHEXIDINE GLUCONATE CLOTH 2 % EX PADS
6.0000 | MEDICATED_PAD | Freq: Every day | CUTANEOUS | Status: DC
  Administered 2019-12-25 – 2019-12-27 (×2): 6 via TOPICAL

## 2019-12-25 NOTE — ED Notes (Signed)
PT to CT.

## 2019-12-25 NOTE — Progress Notes (Signed)
  Echocardiogram 2D Echocardiogram has been performed.  Shane [name] 12/25/2019, 10:46 AM

## 2019-12-25 NOTE — ED Provider Notes (Signed)
[facility] DEPT Provider Note  CSN: [id number] Arrival date & time: 12/24/19 2238  Chief Complaint(s) Back Pain and Flank Pain  HPI Shane [name] is a 74 y.o. male   HPI CC: chest pain  Onset/Duration: 8 hrs today, but has been having pain intermittently for 2-3 weeks Timing: constant for today Location: left chest and back Quality: "heartburn" Severity: moderate Modifying Factors:  Improved by: nothing  Worsened by: nothing Associated Signs/Symptoms:  Pertinent (+): chronic peripheral edema  Pertinent (-): cough, n/v, sob, recent fever or infection, abd pain Context:   Past Medical History Past Medical History:  Diagnosis Date  . Acute asthmatic bronchitis   . Anemia   . Anxiety disorder   . Borderline diabetes mellitus   . Borderline hypertension   . Degenerative joint disease   . Depression   . GERD (gastroesophageal reflux disease)   . History of adenomatous polyp of colon   . History of sinusitis   . Posttraumatic stress disorder   . Sleep apnea    Uses CPAP   Patient Active Problem List   Diagnosis Date Noted  . Acute deep vein thrombosis (DVT) of proximal vein of both lower extremities ([place]) 03/01/2019  . Bradycardia 07/30/2018  . Sleep apnea 07/30/2018  . Murmur 04/30/2018  . Dizziness 04/30/2018  . Palpitations 04/06/2016  . Elevated blood pressure reading without diagnosis of hypertension 01/12/2016  . Sinusitis, chronic 12/19/2015  . Pain in joint, upper arm 11/23/2015  . Numbness and tingling in left arm 11/23/2015  . Asthmatic bronchitis 02/21/2015  . PTSD (post-traumatic stress disorder) 02/21/2015  . Hemorrhoids 06/15/2014  . Anemia 03/31/2008  . GERD 03/31/2008  . Impaired fasting glucose 03/31/2008   Home Medication(s) Prior to Admission medications   Medication Sig Start Date End Date Taking? Authorizing Provider  albuterol (VENTOLIN HFA) 108 (90 Base) MCG/ACT inhaler TAKE 2 PUFFS BY MOUTH EVERY 6  HOURS AS NEEDED FOR WHEEZE OR SHORTNESS OF BREATH Patient taking differently: Inhale 2 puffs into the lungs every 6 (six) hours as needed for wheezing or shortness of breath.  08/23/19  Yes Kozlow, Donnamarie Poag, MD  alum & mag hydroxide-simeth (MYLANTA MAXIMUM STRENGTH) [phone] MG/5ML suspension Take 5 mLs by mouth every 6 (six) hours as needed (abdominal pain or indigestion). 06/10/19  Yes Carmin Muskrat, MD  amoxicillin-clavulanate (AUGMENTIN) 875-125 MG tablet Take 1 tablet by mouth 2 (two) times daily. 12/09/19  Yes [provider]  BREO ELLIPTA 200-25 MCG/INH AEPB TAKE 1 [place] DAY Patient taking differently: Inhale 1 puff into the lungs daily.  12/14/18  Yes Kozlow, Donnamarie Poag, MD  esomeprazole (NEXIUM) 40 MG capsule Take one capsule once a day Patient taking differently: Take 40 mg by mouth daily.  12/09/19  Yes Althea Charon, FNP  triamcinolone (NASACORT ALLERGY 24HR) 55 MCG/ACT AERO nasal inhaler Use one spray in each nostril once daily as directed. Patient taking differently: Place 1 spray into the nose daily.  01/28/17  Yes Kozlow, Donnamarie Poag, MD  esomeprazole (NEXIUM) 40 MG capsule Take 1 capsule (40 mg total) by mouth 2 (two) times daily before a meal. 08/18/18 03/16/19  Kozlow, Donnamarie Poag, MD  montelukast (SINGULAIR) 10 MG tablet TAKE 1 TABLET BY MOUTH EVERYDAY AT BEDTIME Patient taking differently: Take 10 mg by mouth at bedtime.  09/09/19   Kozlow, Donnamarie Poag, MD  predniSONE (DELTASONE) 10 MG tablet 2 tablets once a day for 4 days Patient taking differently: Take 20 mg by mouth See  admin instructions. 2 tablets once a day for 4 days 12/09/19   Althea Charon, FNP  Propylene Glycol (SYSTANE BALANCE OP) Apply 1 drop to eye daily as needed (dry eyes). Reported on 12/19/2015    [provider]                                                                                                                                    Past Surgical History Past Surgical History:  Procedure  Laterality Date  . COLONOSCOPY    . FINGER SURGERY Right    index  . NASAL SINUS SURGERY     x 2  . POLYPECTOMY     Family History Family History  Problem Relation Age of Onset  . Diabetes Father   . Arthritis Mother        Unknown cause of death  . Colon cancer Neg Hx   . Colon polyps Neg Hx   . Rectal cancer Neg Hx   . Stomach cancer Neg Hx   . Allergic rhinitis Neg Hx   . Angioedema Neg Hx   . Asthma Neg Hx   . Eczema Neg Hx   . Immunodeficiency Neg Hx   . Urticaria Neg Hx     Social History Social History   Tobacco Use  . Smoking status: Former Smoker    Packs/day: 0.50    Years: 25.00    Pack years: 12.50    Types: Cigarettes    Quit date: 07/02/1983    Years since quitting: 36.5  . Smokeless tobacco: Never Used  Vaping Use  . Vaping Use: Never used  Substance Use Topics  . Alcohol use: No  . Drug use: No   Allergies Patient has no known allergies.  Review of Systems Review of Systems All other systems are reviewed and are negative for acute change except as noted in the HPI  Physical Exam Vital Signs  I have reviewed the triage vital signs BP (!) 228/103 (BP Location: Left Arm)   Pulse (!) 57   Temp 98.2 F (36.8 C) (Oral)   Resp 16   Ht 6\' 2"  (1.88 m)   Wt 122.5 kg   SpO2 100%   BMI 34.67 kg/m   Physical Exam Vitals reviewed.  Constitutional:      General: He is not in acute distress.    Appearance: He is well-developed. He is not diaphoretic.  HENT:     Head: Normocephalic and atraumatic.     Nose: Nose normal.  Eyes:     General: No scleral icterus.       Right eye: No discharge.        Left eye: No discharge.     Conjunctiva/sclera: Conjunctivae normal.     Pupils: Pupils are equal, round, and reactive to light.  Cardiovascular:     Rate and Rhythm: Normal rate and regular rhythm.     Heart sounds: No  murmur heard.  No friction rub. No gallop.   Pulmonary:     Effort: Pulmonary effort is normal. No respiratory distress.      Breath sounds: Normal breath sounds. No stridor. No rales.  Chest:     Chest wall: No tenderness.  Abdominal:     General: There is no distension.     Palpations: Abdomen is soft.     Tenderness: There is no abdominal tenderness.  Musculoskeletal:        General: No tenderness.     Cervical back: Normal range of motion and neck supple.     Right lower leg: 2+ Pitting Edema present.     Left lower leg: 2+ Pitting Edema present.  Skin:    General: Skin is warm and dry.     Findings: No erythema or rash.  Neurological:     Mental Status: He is alert and oriented to person, place, and time.     ED Results and Treatments Labs (all labs ordered are listed, but only abnormal results are displayed) Labs Reviewed  URINALYSIS, ROUTINE W REFLEX MICROSCOPIC - Abnormal; Notable for the following components:      Result Value   Color, Urine STRAW (*)    Ketones, ur 5 (*)    All other components within normal limits  CBC - Abnormal; Notable for the following components:   RBC 6.27 (*)    Hemoglobin 12.5 (*)    MCV 64.1 (*)    MCH 19.9 (*)    RDW 18.1 (*)    All other components within normal limits  COMPREHENSIVE METABOLIC PANEL - Abnormal; Notable for the following components:   Sodium 134 (*)    Chloride 96 (*)    Glucose, Bld 149 (*)    Total Protein 8.3 (*)    GFR calc non Af Amer 59 (*)    All other components within normal limits  LIPASE, BLOOD  BRAIN NATRIURETIC PEPTIDE  TROPONIN I (HIGH SENSITIVITY)                                                                                                                         EKG  EKG Interpretation  Date/Time:  Saturday December 25 2019 04:21:43 EDT Ventricular Rate:  62 PR Interval:    QRS Duration: 102 QT Interval:  470 QTC Calculation: 478 R Axis:   28 Text Interpretation: Sinus rhythm Ventricular premature complex Probable left atrial enlargement Borderline prolonged QT interval No STEMI. Confirmed by Addison Lank [phone])  on 12/25/2019 4:35:31 AM      Radiology DG Chest 2 View  Result Date: 12/25/2019 CLINICAL DATA:  Upper back pain radiating to the left flank EXAM: CHEST - 2 VIEW COMPARISON:  02/16/2019 FINDINGS: The heart size and mediastinal contours are within normal limits. Both lungs are clear. The visualized skeletal structures are unremarkable. IMPRESSION: No active cardiopulmonary disease. Electronically Signed   By: Ulyses Jarred M.D.   On: 12/25/2019 05:15    Pertinent  labs & imaging results that were available during my care of the patient were reviewed by me and considered in my medical decision making (see chart for details).  Medications Ordered in ED Medications  nitroGLYCERIN 50 mg in dextrose 5 % 250 mL (0.2 mg/mL) infusion (5 mcg/min Intravenous New Bag/Given 12/25/19 0636)  sodium chloride (PF) 0.9 % injection (has no administration in time range)  nitroGLYCERIN (NITROGLYN) 2 % ointment 1 inch (1 inch Topical Given 12/25/19 0501)  aspirin chewable tablet 324 mg (324 mg Oral Given 12/25/19 0501)  iohexol (OMNIPAQUE) 350 MG/ML injection 100 mL (100 mLs Intravenous Contrast Given 12/25/19 0711)                                                                                                                                    Procedures .Critical Care Performed by: Fatima Blank, MD Authorized by: Fatima Blank, MD    CRITICAL CARE Performed by: Grayce Sessions Melanie Pellot Total critical care time: 45 minutes Critical care time was exclusive of separately billable procedures and treating other patients. Critical care was necessary to treat or prevent imminent or life-threatening deterioration. Critical care was time spent personally by me on the following activities: development of treatment plan with patient and/or surrogate as well as nursing, discussions with consultants, evaluation of patient's response to treatment, examination of patient, obtaining history from patient or  surrogate, ordering and performing treatments and interventions, ordering and review of laboratory studies, ordering and review of radiographic studies, pulse oximetry and re-evaluation of patient's condition.   (including critical care time)  Medical Decision Making / ED Course I have reviewed the nursing notes for this encounter and the patient's prior records (if available in EHR or on provided paperwork).   ANDREUS CURE was evaluated in Emergency Department on 12/25/2019 for the symptoms described in the history of present illness. He was evaluated in the context of the global COVID-19 pandemic, which necessitated consideration that the patient might be at risk for infection with the SARS-CoV-2 virus that causes COVID-19. Institutional protocols and algorithms that pertain to the evaluation of patients at risk for COVID-19 are in a state of rapid change based on information released by regulatory bodies including the CDC and federal and state organizations. These policies and algorithms were followed during the patient's care in the ED.  Left chest and back pain. Noted to be hypertensive with SBP >220s EKG w/o acute ischemic changes or evidence of pericarditis. Initial trop negative. HEART 4  Given ASA and NTG paste Pain improved  Chest x-ray without evidence suggestive of pneumonia, pneumothorax, pneumomediastinum.  No abnormal contour of the mediastinum to suggest dissection. No evidence of acute injuries.  Given the HTN and chest/back pain, will get CTA to rule out dissection. If negative, patient will need admission for ACS rule out and management of severe HTN.  No renal insufficiency  Placed on NTG gtt.  CTA  w/o dissection.  Admitted to hospitalist service.       Final Clinical Impression(s) / ED Diagnoses Final diagnoses:  Chest pain  Hypertensive urgency      This chart was dictated using voice recognition software.  Despite best efforts to proofread,   errors can occur which can change the documentation meaning.   Fatima Blank, MD 12/25/19 [phone]

## 2019-12-25 NOTE — ED Triage Notes (Signed)
Pt sts left sided upper back pain that radiates to the left flank area. Pt also states heartburn is acting up.

## 2019-12-25 NOTE — ED Notes (Signed)
There is no nitro stocked in pyxis call to pharmacy

## 2019-12-25 NOTE — ED Notes (Signed)
Tried to call report again no answer

## 2019-12-25 NOTE — Progress Notes (Signed)
ANTICOAGULATION CONSULT NOTE - Initial Consult  Pharmacy Consult for Xarelto Indication: DVT  No Known Allergies  Patient Measurements: Height: 6\' 2"  (188 cm) Weight: 122.5 kg (270 lb) IBW/kg (Calculated) : 82.2  Vital Signs: Temp: 98.2 F (36.8 C) (06/26 0007) Temp Source: Oral (06/26 0007) BP: 164/100 (06/26 1000) Pulse Rate: 63 (06/26 1000)  Labs: Recent Labs    12/25/19 0440  HGB 12.5*  HCT 40.2  PLT 163  CREATININE 1.19  TROPONINIHS 16    Estimated Creatinine Clearance: 74.6 mL/min (by C-G formula based on SCr of 1.19 mg/dL).   Medical History: Past Medical History:  Diagnosis Date  . Acute asthmatic bronchitis   . Anemia   . Anxiety disorder   . Borderline diabetes mellitus   . Borderline hypertension   . Degenerative joint disease   . Depression   . GERD (gastroesophageal reflux disease)   . History of adenomatous polyp of colon   . History of sinusitis   . Posttraumatic stress disorder   . Sleep apnea    Uses CPAP    Medications:  PTA Xarelto  Assessment:  89yr male presents to ED with c/o chest pain and back pain and found to be in hypertensive urgency  PMH significant for asthma, DVT (diagnosed 11/2018)  CTAngio negative for PE  Pharmacy consulted to dose Xarelto  Original med rec did not include Xarelto.  Current med rec tech visited patient who shared he was taking Xarelto 15mg  po BID with last dose taken on 12/23/19 @ 19:00.  As this is a starting dose for treatment, pharmacist called [facility] where patient fills prescription.  Last prescription on file for Xarelto is for 20mg  po once daily which was last filled on 03/01/2019 for a quantitiy of 90.  Outpatient MD note with Dr Lesly Rubenstein on 03/01/2019. Note states did not start Xarelto treatment until beginning of Aug with plan to continue Xarelto x 6 months or maybe longer.  Given likely non-compliance with medication and no active new VTE event, will dose Xarelto according to last  prescribed regimen by outpatient physician  Goal of Therapy:  Full anticoagulation Monitor platelets by anticoagulation protocol: Yes   Plan:  Xarelto 20mg  daily   Carlee Tesfaye, Toribio Harbour, PharmD 12/25/2019,10:21 AM

## 2019-12-25 NOTE — ED Notes (Signed)
Attempted report 2x in past 10 min, no answer

## 2019-12-25 NOTE — ED Notes (Signed)
[place] at bedside doing ECHO

## 2019-12-25 NOTE — H&P (Addendum)
History and Physical  Shane [name] HQR:[id number] DOB: May 26, 1946 DOA: 12/25/2019  Referring physician: Addison [name]  PCP: Shane Koch, MD  Outpatient Specialists: Althea Charon, FNP (Allergy) Patient coming from: home  Chief Complaint: chest pain and back pain   HPI: Shane [name] is a 74 y.o. 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  Patient states he was in his usual state of health, until he noticed what he describes as heartburn on the left side of his chest.  This is typical for him as he takes Nexium for acid reflux.  He did not have any Nexium refilled so he tried Mylanta with no improvement.  He stated the pain in his left chest was different from his typical type of heartburn as it was very severe with 10 out of 10 pain and associated with nausea and dry heaves as well as upper neck pain, and lower back pain.  He denied any radiation to the shoulder, no shortness of breath, no diaphoresis.  Denies any recent changes in medications, no fevers or chills.  Given the severity of the pain he decided to come to the ED and drove himself here.  He does note some chronic swelling in both legs but he is not sure if that is changed recently.  He does take Xarelto for DVT diagnosed in the right femoral vein on 11/2018.  He hass not noted any blood in his urine, or blood in his stool.    In the ED patient was afebrile + to have blood pressure elevated of 232/178. Lab work notable for SARS PCR pending, hemoglobin 12.5, MCV 64.1.  Sodium 134, glucose 149.  Lipase 22, troponin unremarkable, BNP 86, EKG with nonischemic changes.  UA unremarkable.  Chest x-ray nonacute CTA negative for aortic dissection/PE.  But did note cardiomegaly as well as left-sided perinephric  stranding.  In the ED patient was given aspirin, started on nitro drip.  [facility] service was called for further management and evaluation. Review of Systems:As mentioned in the history of present illness.Review of systems are otherwise negative Patient seen in the ED.   Past Medical History:  Diagnosis Date  . Acute asthmatic bronchitis   . Anemia   . Anxiety disorder   . Borderline diabetes mellitus   . Borderline hypertension   . Degenerative joint disease   . Depression   . GERD (gastroesophageal reflux disease)   . History of adenomatous polyp of colon   . History of sinusitis   . Posttraumatic stress disorder   . Sleep apnea    Uses CPAP   Past Surgical History:  Procedure Laterality Date  . COLONOSCOPY    . FINGER SURGERY Right    index  . NASAL SINUS SURGERY     x 2  . POLYPECTOMY     No Known Allergies Social History:  reports that he quit smoking about 36 years ago. His smoking use included cigarettes. He has a 12.50 pack-year smoking history. He has never used smokeless tobacco. He reports that he does not drink alcohol and does not use drugs. Family History  Problem Relation Age of Onset  . Diabetes Father   . Arthritis Mother        Unknown cause of death  . Colon cancer Neg Hx   . Colon polyps Neg Hx   .  Rectal cancer Neg Hx   . Stomach cancer Neg Hx   . Allergic rhinitis Neg Hx   . Angioedema Neg Hx   . Asthma Neg Hx   . Eczema Neg Hx   . Immunodeficiency Neg Hx   . Urticaria Neg Hx       Prior to Admission medications   Medication Sig Start Date End Date Taking? Authorizing Provider  albuterol (VENTOLIN HFA) 108 (90 Base) MCG/ACT inhaler TAKE 2 PUFFS BY MOUTH EVERY 6 HOURS AS NEEDED FOR WHEEZE OR SHORTNESS OF BREATH Patient taking differently: Inhale 2 puffs into the lungs every 6 (six) hours as needed for wheezing or shortness of breath.  08/23/19  Yes Kozlow, Donnamarie Poag, MD  alum & mag hydroxide-simeth (MYLANTA MAXIMUM STRENGTH)  676-195-09 MG/5ML suspension Take 5 mLs by mouth every 6 (six) hours as needed (abdominal pain or indigestion). 06/10/19  Yes Carmin Muskrat, MD  amoxicillin-clavulanate (AUGMENTIN) 875-125 MG tablet Take 1 tablet by mouth 2 (two) times daily. 12/09/19  Yes [provider]  BREO ELLIPTA 200-25 MCG/INH AEPB TAKE 1 [place] DAY Patient taking differently: Inhale 1 puff into the lungs daily.  12/14/18  Yes Kozlow, Donnamarie Poag, MD  esomeprazole (NEXIUM) 40 MG capsule Take one capsule once a day Patient taking differently: Take 40 mg by mouth daily.  12/09/19  Yes Althea Charon, FNP  triamcinolone (NASACORT ALLERGY 24HR) 55 MCG/ACT AERO nasal inhaler Use one spray in each nostril once daily as directed. Patient taking differently: Place 1 spray into the nose daily.  01/28/17  Yes Kozlow, Donnamarie Poag, MD  esomeprazole (NEXIUM) 40 MG capsule Take 1 capsule (40 mg total) by mouth 2 (two) times daily before a meal. 08/18/18 03/16/19  Kozlow, Donnamarie Poag, MD  montelukast (SINGULAIR) 10 MG tablet TAKE 1 TABLET BY MOUTH EVERYDAY AT BEDTIME Patient taking differently: Take 10 mg by mouth at bedtime.  09/09/19   Kozlow, Donnamarie Poag, MD  predniSONE (DELTASONE) 10 MG tablet 2 tablets once a day for 4 days Patient taking differently: Take 20 mg by mouth See admin instructions. 2 tablets once a day for 4 days 12/09/19   Althea Charon, FNP  Propylene Glycol (SYSTANE BALANCE OP) Apply 1 drop to eye daily as needed (dry eyes). Reported on 12/19/2015    [provider]    Physical Exam: BP (!) 173/87 (BP Location: Left Arm)   Pulse 60   Temp 98.2 F (36.8 C) (Oral)   Resp 16   Ht 6\' 2"  (1.88 m)   Wt 122.5 kg   SpO2 99%   BMI 34.67 kg/m   Constitutional does not elderly gentleman, in no distress resting comfortably Eyes: EOMI, anicteric, normal conjunctivae ENMT: Oropharynx with moist mucous membranes, normal dentition Neck: FROM Cardiovascular: RRR no MRGs, with 1+ pitting edema of bilateral  legs Respiratory: Normal respiratory effort on room air, no wheezing  Abdomen: Soft,non-tender, normal bowel sounds Skin: Dry skin with hyperpigmentation of bilateral lower legs Neurologic: Grossly no focal neuro deficit. Psychiatric:Appropriate affect, and mood. Mental status AAOx3          Labs on Admission:  Basic Metabolic Panel: Recent Labs  Lab 12/25/19 0440  NA 134*  K 3.8  CL 96*  CO2 26  GLUCOSE 149*  BUN 17  CREATININE 1.19  CALCIUM 9.1   Liver Function Tests: Recent Labs  Lab 12/25/19 0440  AST 28  ALT 14  ALKPHOS 96  BILITOT 0.8  PROT 8.3*  ALBUMIN 4.9  Recent Labs  Lab 12/25/19 0440  LIPASE 22   No results for input(s): AMMONIA in the last 168 hours. CBC: Recent Labs  Lab 12/25/19 0440  WBC 6.3  HGB 12.5*  HCT 40.2  MCV 64.1*  PLT 163   Cardiac Enzymes: No results for input(s): CKTOTAL, CKMB, CKMBINDEX, TROPONINI in the last 168 hours.  BNP (last 3 results) Recent Labs    02/16/19 0755 12/25/19 0440  BNP 44.9 86.0    ProBNP (last 3 results) No results for input(s): PROBNP in the last 8760 hours.  CBG: No results for input(s): GLUCAP in the last 168 hours.  Radiological Exams on Admission: DG Chest 2 View  Result Date: 12/25/2019 CLINICAL DATA:  Upper back pain radiating to the left flank EXAM: CHEST - 2 VIEW COMPARISON:  02/16/2019 FINDINGS: The heart size and mediastinal contours are within normal limits. Both lungs are clear. The visualized skeletal structures are unremarkable. IMPRESSION: No active cardiopulmonary disease. Electronically Signed   By: Ulyses Jarred M.D.   On: 12/25/2019 05:15    EKG: Independently reviewed.  No ST elevations, no T wave inversions.  PVC.,  Concern for left atrial enlargement.  Assessment/Plan Present on Admission: . Hypertensive urgency . Moderate persistent asthma . Sinusitis, chronic . GERD . Anemia  Active Problems:   Anemia   GERD   Sinusitis, chronic   Hypertensive urgency    Moderate persistent asthma  Hypertensive urgency Blood pressure elevated to 200s over 120s on admission with complaints of chest pain/back pain.  EKG nonischemic.  Troponin unremarkable CTA negative for dissection/PE.  UA unremarkable and kidney function within normal limits.  Suspect patient has had hypertension for quite some time but not formally diagnosed  based off review of office visits from 2020 with blood pressures ranging from 161W to 960A systolic.  Need to continue nitro drip given SBP still in the 190s to 200s.  Given cardiomegaly, peripheral edema and elevated BP some concern for CHF. Suspect chest pain related to elevated HTN as it has improved on nitro gtt and no evidence of ischemia currently -Continue nitro drip, goal SBP around 160s (avoid acute drop greater than 20% in first 24 hours) -Will likely need to BP meds oral regimen (considering amlodipine, lisinopril), defer based on TTE results -TTE given cardiomegaly noted on CTA as well as peripheral edema on exam -UDS, patient declines history of substance abuse, just for complete work-up -Continue to monitor urine output, BMP, chest discomfort -Trial of IV Lasix 40 mg x 1 and monitor output  Moderate persistent asthma, stable. No wheezing on exam, normal respiratory effort, no O2 requirements. -Continue home Breo, albuterol as needed, Singulair -Incentive spirometry, flutter valve  GERD He describes his chest pain as heartburn in nature I suspect his chest pain is more related to high blood pressure. 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 -Check iron panel -Monitor CBC  History of right femoral vein DVT (diagnosed 11/2018), stable Suspect bilateral 1+ pitting edema not related to new DVT but probably concern for CHF in setting of hypertensive urgency. -Resume home Xarelto  Bradycardia, chronic and stable --Monitor on  telemetry. Avoid beta blockers for HTN    DVT prophylaxis: Home Xarelto  Code Status: Full code discussed on day of admission  Family Communication: Family communication at bedside  Disposition Plan: Expect patient to require greater than 2 midnight stay given currently requiring nitro infusion due  to hypertensive urgency with SBP still in the 190s to 200s, as well as concern for CHF given cardiomegaly peripheral edema in setting of hypertensive urgency, currently requiring IV Lasix  Consults called: None  Admission status: Admitted as inpatient to stepdown unit      Desiree Hane MD [facility]  Pager [phone]  If 7PM-7AM, please contact night-coverage [email or web address] Password [facility]  12/25/2019, 7:51 AM

## 2019-12-26 DIAGNOSIS — N179 Acute kidney failure, unspecified: Secondary | ICD-10-CM

## 2019-12-26 DIAGNOSIS — I161 Hypertensive emergency: Secondary | ICD-10-CM

## 2019-12-26 DIAGNOSIS — I249 Acute ischemic heart disease, unspecified: Secondary | ICD-10-CM

## 2019-12-26 DIAGNOSIS — R0789 Other chest pain: Secondary | ICD-10-CM

## 2019-12-26 LAB — CBC
HCT: 38.2 % — ABNORMAL LOW (ref 39.0–52.0)
Hemoglobin: 12 g/dL — ABNORMAL LOW (ref 13.0–17.0)
MCH: 20.2 pg — ABNORMAL LOW (ref 26.0–34.0)
MCHC: 31.4 g/dL (ref 30.0–36.0)
MCV: 64.3 fL — ABNORMAL LOW (ref 80.0–100.0)
Platelets: 173 10*3/uL (ref 150–400)
RBC: 5.94 MIL/uL — ABNORMAL HIGH (ref 4.22–5.81)
RDW: 18.2 % — ABNORMAL HIGH (ref 11.5–15.5)
WBC: 6.2 10*3/uL (ref 4.0–10.5)
nRBC: 0 % (ref 0.0–0.2)

## 2019-12-26 LAB — BASIC METABOLIC PANEL
Anion gap: 12 (ref 5–15)
BUN: 23 mg/dL (ref 8–23)
CO2: 25 mmol/L (ref 22–32)
Calcium: 8.8 mg/dL — ABNORMAL LOW (ref 8.9–10.3)
Chloride: 99 mmol/L (ref 98–111)
Creatinine, Ser: 1.4 mg/dL — ABNORMAL HIGH (ref 0.61–1.24)
GFR calc Af Amer: 57 mL/min — ABNORMAL LOW (ref >60)
GFR calc non Af Amer: 49 mL/min — ABNORMAL LOW (ref >60)
Glucose, Bld: 108 mg/dL — ABNORMAL HIGH (ref 70–99)
Potassium: 4.3 mmol/L (ref 3.5–5.1)
Sodium: 136 mmol/L (ref 135–145)

## 2019-12-26 LAB — TROPONIN I (HIGH SENSITIVITY): Troponin I (High Sensitivity): 15 ng/L (ref <18)

## 2019-12-26 MED ORDER — MORPHINE SULFATE (PF) 2 MG/ML IV SOLN: 2.0000 mg | INTRAVENOUS | Status: DC | PRN

## 2019-12-26 MED ORDER — ENOXAPARIN SODIUM 120 MG/0.8ML ~~LOC~~ SOLN
120.0000 mg | Freq: Two times a day (BID) | SUBCUTANEOUS | Status: DC
  Administered 2019-12-26 – 2019-12-27 (×3): 120 mg via SUBCUTANEOUS
  Filled 2019-12-26 (×3): qty 0.8

## 2019-12-26 MED ORDER — ASPIRIN 81 MG PO CHEW
81.0000 mg | CHEWABLE_TABLET | Freq: Every day | ORAL | Status: DC
  Administered 2019-12-27: 81 mg via ORAL
  Filled 2019-12-26: qty 1

## 2019-12-26 MED ORDER — ASPIRIN 81 MG PO CHEW
324.0000 mg | CHEWABLE_TABLET | Freq: Once | ORAL | Status: AC
  Administered 2019-12-26: 324 mg via ORAL
  Filled 2019-12-26: qty 4

## 2019-12-26 MED ORDER — SODIUM CHLORIDE 0.9 % IV SOLN: INTRAVENOUS | Status: DC

## 2019-12-26 NOTE — Progress Notes (Signed)
PROGRESS NOTE  Shane [name] TIR:[id number] DOB: October 03, 1945 DOA: 12/25/2019 PCP: [name], MD  Brief History   74 year old man PMH right femoral vein DVT 2020 on Xarelto, asthma, followed by Dr. Percival Spanish in past for palpitations of unclear significance, murmur, presented with left-sided chest pain  A & P  Hypertensive emergency with systolic blood pressure over 676, diastolic over 195 on admission with associated acute kidney injury, chest pain and back pain.  CTA chest negative for dissection and pulmonary embolism.  Initial troponin negative.  EKG nonischemic.  Treated with nitroglycerin infusion with improvement to goal. --BP currently at goal but continues to have chest pain.  Wean nitroglycerin as tolerated for SBP 160.  Suspect chronic hypertension poorly controlled. --Started on amlodipine yesterday.  Will probably need second agent.  Bradycardia may preclude beta-blocker. --Echocardiogram reassuring  Chest pain in the context of hypertensive emergency, ongoing, concern for ACS. --Initial troponin was negative.  Will repeat x1 now.  EKG today sinus rhythm, inferior infarct, new compared to old studies.  No STEMI. --ASA x1.  Start enoxaparin.  Continue blood pressure control.  Analgesia as needed. --Cardiology consultation for further recommendations.  Followed by Dr. Percival Spanish.  Acute kidney injury secondary to hypertensive emergency. --IV fluids, repeat BMP in a.m.  Follow intake, outtake.  GERD --PPI  Asthma persistent --Appears stable.  Continue bronchodilators, montelukast  Mild anemia with marked microcytosis out of proportion to anemia, consistent with thalassemia.  Mother with history of thalassemia. --Follow-up as an outpatient.  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 --Hold Xarelto while on Lovenox.  OSA --CPAP QHS  Disposition Plan:  Discussion:   Status is: Inpatient  Remains inpatient appropriate because:IV treatments appropriate due to intensity  of illness or inability to take PO   Dispo: The patient is from: Home              Anticipated d/c is to: Home              Anticipated d/c date is: 2 days              Patient currently is not medically stable to d/c.  DVT prophylaxis: Enoxaparin Code Status: Full Family Communication: none  Murray Hodgkins, MD  [facility] Direct contact: see [email or web address] (further directions at bottom of note if needed) 7PM-7AM contact night coverage as at bottom of note 12/26/2019, 10:42 AM  LOS: 1 day   Significant Hospital Events   . 6/26 admitted for hypertensive emergency   Consults:  . Cardiology    Procedures:  .   Significant Diagnostic Tests:  . Echocardiogram LVEF 65-70%.  Indeterminate diastolic parameters.  Normal RV systolic function.  No significant valvular abnormalities noted.   Micro Data:  .    Antimicrobials:  .   Interval History/Subjective  Breathing okay, however chest pain has recurred, 9/10.  Difficult to characterize.  Located over left breast, radiates to shoulder, and down back.  Reports he had a heart attack years ago, followed by Dr. Percival Spanish in the outpatient setting.  No recent chest pain or shortness of breath.  Not very active, lives alone.  Objective   Vitals:  Vitals:   12/26/19 1000 12/26/19 1029  BP: (!) 150/76   Pulse: 69   Resp: 17   Temp:    SpO2: 93% 94%    Exam:  Constitutional.  Peers calm and comfortable actually. Respiratory.  Clear to auscultation bilaterally.  No wheezes, rales or rhonchi.  Normal respiratory effort. Cardiovascular.  Regular  rate and rhythm.  No murmur, rub or gallop.  2+ bilateral lower extremity edema which appears to be chronic with chronic skin changes.  Telemetry sinus rhythm. Abdomen soft. Skin.  Chronic skin changes bilateral lower extremities, dry, brawny edema, pedal edema. Psychiatric.  Grossly normal mood and affect.  Speech fluent and appropriate.  I have personally reviewed the following:    Today's Data  . Creatinine up to 1.40, above baseline. . Hemoglobin stable at 12.0.  Scheduled Meds: . amLODipine  10 mg Oral Daily  . aspirin  324 mg Oral Once  . [START ON 12/27/2019] aspirin  81 mg Oral Daily  . Chlorhexidine Gluconate Cloth  6 each Topical Daily  . enoxaparin (LOVENOX) injection  120 mg Subcutaneous BID  . fluticasone furoate-vilanterol  1 puff Inhalation Daily  . montelukast  10 mg Oral QHS  . pantoprazole  40 mg Oral Daily  . triamcinolone  1 spray Nasal Daily   Continuous Infusions: . nitroGLYCERIN 75 mcg/min (12/26/19 0800)    Principal Problem:   Hypertensive emergency Active Problems:   Microcytic anemia   GERD   Sinusitis, chronic   Bradycardia   Right femoral vein DVT (HCC)   Moderate persistent asthma   ACS (acute coronary syndrome) ([place])   AKI (acute kidney injury) ([place])   LOS: 1 day   How to contact the [facility] Attending or Consulting provider [place] or covering provider during after hours [place], for this patient?  1. Check the care team in [facility] and look for a) attending/consulting [facility] provider listed and b) the [facility] team listed 2. Log into [email or web address] and use [place]'s universal password to access. If you do not have the password, please contact the hospital operator. 3. Locate the [facility] provider you are looking for under [facility] and page to a number that you can be directly reached. 4. If you still have difficulty reaching the provider, please page the [facility] (Director on Call) for the Hospitalists listed on amion for assistance.

## 2019-12-26 NOTE — Progress Notes (Signed)
[place] for Lovenox  Indication: ACS, previously prescribed Xarelto 20mg  qd for DVT; non-compliant  No Known Allergies  Patient Measurements: Height: 6\' 2"  (188 cm) Weight: 119.5 kg (263 lb 7.2 oz) IBW/kg (Calculated) : 82.2  Vital Signs: Temp: 98.3 F (36.8 C) (06/27 0910) Temp Source: Oral (06/27 0910) BP: 150/76 (06/27 1000) Pulse Rate: 69 (06/27 1000)  Labs: Recent Labs    12/25/19 0440 12/26/19 0326  HGB 12.5* 12.0*  HCT 40.2 38.2*  PLT 163 173  CREATININE 1.19 1.40*  TROPONINIHS 16  --    Estimated Creatinine Clearance: 62.6 mL/min (A) (by C-G formula based on SCr of 1.4 mg/dL (H)).  Medical History: Past Medical History:  Diagnosis Date  . Acute asthmatic bronchitis   . Anemia   . Anxiety disorder   . Borderline diabetes mellitus   . Borderline hypertension   . Degenerative joint disease   . Depression   . GERD (gastroesophageal reflux disease)   . History of adenomatous polyp of colon   . History of sinusitis   . Posttraumatic stress disorder   . Sleep apnea    Uses CPAP   Medications:  PTA Xarelto  Assessment:  55yr male presents to ED with c/o chest pain and back pain and found to be in hypertensive urgency PMH significant for asthma, DVT (diagnosed 11/2018) CTAngio negative for PE  Pharmacy consulted to dose Xarelto 6/26  Original med rec did not include Xarelto.  Current med rec tech visited patient who shared he was taking Xarelto 15mg  po BID with last dose taken on 12/23/19 @ 19:00.  As this is a starting dose for treatment, pharmacist called [facility] where patient fills prescription.  Last prescription on file for Xarelto is for 20mg  po once daily which was last filled on 03/01/2019 for a quantitiy of 90.  Outpatient MD note with Dr Lesly Rubenstein on 03/01/2019. Note states did not start Xarelto treatment until beginning of Aug with plan to continue Xarelto x 6 months or maybe longer. 6/26: Given likely  non-compliance with medication and no active new VTE event, dosed Xarelto according to last prescribed regimen by outpatient physician  6/27: discontinue Xarelto, begin Lovenox for ACS/NSTEMI. Last Xarelto dose 10mg  was 6/26 at 12N  Goal of Therapy:  Full anticoagulation AntiXa level 0.6-1 (if level necessary) Monitor platelets by anticoagulation protocol: Yes   Plan:  Begin Lovenox 120mg  SQ q12 (1mg /kg q12)  Daily CBC Consider peak AntiXa level at steady state, pt BMI 34  Minda Ditto, PharmD 12/26/2019,10:06 AM

## 2019-12-26 NOTE — Consult Note (Signed)
CARDIOLOGY CONSULT NOTE     Primary Care Physician: [name], MD Referring Physician:  Dr Kinnie Feil Date: 12/25/2019  Reason for consultation:  Hypertensive urgency/ ACS  Shane [name] is a 74 y.o. male with a h/o OSA, HTN, prior DVT (6/20) who is admitted with L shounder/side pain and hypertensive urgency. The patient reports pain over his L trapezius distribution including his left neck and shoulder.  In addition, he has pain into his left side and radiating into his L lower back.  He denies SSCP or SOB.  He has had some nausea.  + edema. On arrival, his BP was 232/178.  He has been placed in IV nitro with improvement in his BP.  He is admitted for further evaluation. Today, he denies symptoms of palpitations,  shortness of breath, orthopnea, PND, dizziness, presyncope, syncope, HA, blurred vision, or neurologic sequela. The patient is tolerating medications without difficulties and is otherwise without complaint today.   Past Medical History:  Diagnosis Date  . Acute asthmatic bronchitis   . Anemia   . Anxiety disorder   . Borderline diabetes mellitus   . Borderline hypertension   . Degenerative joint disease   . Depression   . GERD (gastroesophageal reflux disease)   . History of adenomatous polyp of colon   . History of sinusitis   . Posttraumatic stress disorder   . Sleep apnea    Uses CPAP   Past Surgical History:  Procedure Laterality Date  . COLONOSCOPY    . FINGER SURGERY Right    index  . NASAL SINUS SURGERY     x 2  . POLYPECTOMY      . amLODipine  10 mg Oral Daily  . aspirin  324 mg Oral Once  . [START ON 12/27/2019] aspirin  81 mg Oral Daily  . Chlorhexidine Gluconate Cloth  6 each Topical Daily  . fluticasone furoate-vilanterol  1 puff Inhalation Daily  . montelukast  10 mg Oral QHS  . pantoprazole  40 mg Oral Daily  . triamcinolone  1 spray Nasal Daily   . nitroGLYCERIN 75 mcg/min (12/26/19 0800)    No Known  Allergies  Social History   Socioeconomic History  . Marital status: Single    Spouse name: Not on file  . Number of children: 1  . Years of education: Not on file  . Highest education level: Not on file  Occupational History  . Occupation: former Building control surveyor for [employer]  Tobacco Use  . Smoking status: Former Smoker    Packs/day: 0.50    Years: 25.00    Pack years: 12.50    Types: Cigarettes    Quit date: 07/02/1983    Years since quitting: 36.5  . Smokeless tobacco: Never Used  Vaping Use  . Vaping Use: Never used  Substance and Sexual Activity  . Alcohol use: No  . Drug use: No  . Sexual activity: Not on file  Other Topics Concern  . Not on file  Social History Narrative   Lives alone.  One child.    Social Determinants of Health   Financial Resource Strain:   . Difficulty of Paying Living Expenses:   Food Insecurity:   . Worried About Charity fundraiser in the Last Year:   . Arboriculturist in the Last Year:   Transportation Needs:   . Film/video editor (Medical):   Marland Kitchen Lack of Transportation (Non-Medical):   Physical Activity:   .  Days of Exercise per Week:   . Minutes of Exercise per Session:   Stress:   . Feeling of Stress :   Social Connections:   . Frequency of Communication with Friends and Family:   . Frequency of Social Gatherings with Friends and Family:   . Attends Religious Services:   . Active Member of Clubs or Organizations:   . Attends Archivist Meetings:   Marland Kitchen Marital Status:   Intimate Partner Violence:   . Fear of Current or Ex-Partner:   . Emotionally Abused:   Marland Kitchen Physically Abused:   . Sexually Abused:     Family History  Problem Relation Age of Onset  . Diabetes Father   . Arthritis Mother        Unknown cause of death  . Colon cancer Neg Hx   . Colon polyps Neg Hx   . Rectal cancer Neg Hx   . Stomach cancer Neg Hx   . Allergic rhinitis Neg Hx   . Angioedema Neg Hx   . Asthma Neg Hx   .  Eczema Neg Hx   . Immunodeficiency Neg Hx   . Urticaria Neg Hx     ROS- All systems are reviewed and negative except as per the HPI above  Physical Exam: Telemetry:  sinus Vitals:   12/26/19 0915 12/26/19 0930 12/26/19 0945 12/26/19 1000  BP: (!) 143/70 (!) 142/98 (!) 156/76 (!) 150/76  Pulse: 70 74 68 69  Resp: 14 14 16 17   Temp:      TempSrc:      SpO2: 92% 94% 92% 93%  Weight:      Height:        GEN- The patient is well appearing, alert and oriented x 3 today.   Head- normocephalic, atraumatic Eyes-  Sclera clear, conjunctiva pink Ears- hearing intact Oropharynx- clear Neck- supple, tender over his L trapezius Lungs-   normal work of breathing Heart- Regular rate and rhythm  GI- soft  Extremities- no clubbing, cyanosis, + dependant edema MS- no significant deformity or atrophy Skin- no rash or lesion Psych- euthymic mood, full affect Neuro- strength and sensation are intact  EKG:  Reveals sinus rhythm with qt prolongation, LVH with inferior infarct, unchanged from prior, no ischemic changes  Labs:   Lab Results  Component Value Date   WBC 6.2 12/26/2019   HGB 12.0 (L) 12/26/2019   HCT 38.2 (L) 12/26/2019   MCV 64.3 (L) 12/26/2019   PLT 173 12/26/2019    Recent Labs  Lab 12/25/19 0440 12/25/19 0440 12/26/19 0326  NA 134*   < > 136  K 3.8   < > 4.3  CL 96*   < > 99  CO2 26   < > 25  BUN 17   < > 23  CREATININE 1.19   < > 1.40*  CALCIUM 9.1   < > 8.8*  PROT 8.3*  --   --   BILITOT 0.8  --   --   ALKPHOS 96  --   --   ALT 14  --   --   AST 28  --   --   GLUCOSE 149*   < > 108*   < > = values in this interval not displayed.   No results found for: LDLDIRECT    Radiology: chest/ abd CT from 12/25/19- no thoracic dissection, no central PTE, no acute cardiopulmonary dz, _ L sided perinephric stranding  Echo: EF 65%, moderate LVH  ASSESSMENT AND  PLAN:   1. Hypertensive urgency He presents with hypertensive urgency He has been appropriately  started on amlodipine Wean IV nitro to off Avoid NSAIDs  2. Chest pain/ L shoulder/ neck pain, L flank pain Atypical for MI/ [place] and troponin is negative thus far. If next troponin is negative, could consider myoview prior to discharge. He has CT findings of stranding around his L kidney which is an area of pain for him.  Further evaluation by primary team is advised  3. Prior DVT On xarelto  4. OSA CPAP advised  Cardiology to follow  Maser Grayer, MD 12/26/2019  10:11 AM

## 2019-12-26 NOTE — Progress Notes (Signed)
Patient blood pressures have been stable throughout the night on 15 mcg nitro IV. This morning shortly after 0400 BP read 232/105 previous blood pressure at 0330 was 137/85 . Rechecked for accuracy and BP was 177/101. PT is now complaining of sudden onset headache and chest pain. Pain treated with 650 mg tylenol and prn oxycodone 5 mg. Currently titrating nitro upwards to 69mcg with a current BP of 172/99 with a map of 122. HR was in the 60's for most of the night but with upwards titration is sitting between 54-59 bpm. Contacting on call physician for abnormal vital signs. Will continue to assess for change in status.

## 2019-12-26 NOTE — Progress Notes (Signed)
Pt now c/o worsening left chest pain rated as 8-9/10. Describes it as aching and also c/o of sharp pain in back. SBP increased to the 160-170s. Transferred pt from chair to bed. Daily dose of amlodipine given. EKG done and Nitro gtt restarted. Dr. Sarajane Jews notified. New orders received.  Pt now appearing comfortable with eyes closed and in no distress. When awoken, pt states pain is still "terrible but a little better." SBP eventually came down to the 150s. Per MD, titrate nitro gtt to keep SBP <160.

## 2019-12-27 ENCOUNTER — Telehealth: Payer: Self-pay | Admitting: Cardiology

## 2019-12-27 DIAGNOSIS — I2584 Coronary atherosclerosis due to calcified coronary lesion: Secondary | ICD-10-CM

## 2019-12-27 DIAGNOSIS — I700 Atherosclerosis of aorta: Secondary | ICD-10-CM

## 2019-12-27 DIAGNOSIS — R072 Precordial pain: Secondary | ICD-10-CM

## 2019-12-27 DIAGNOSIS — I2510 Atherosclerotic heart disease of native coronary artery without angina pectoris: Secondary | ICD-10-CM

## 2019-12-27 LAB — BASIC METABOLIC PANEL
Anion gap: 4 — ABNORMAL LOW (ref 5–15)
BUN: 18 mg/dL (ref 8–23)
CO2: 30 mmol/L (ref 22–32)
Calcium: 8.3 mg/dL — ABNORMAL LOW (ref 8.9–10.3)
Chloride: 103 mmol/L (ref 98–111)
Creatinine, Ser: 1.21 mg/dL (ref 0.61–1.24)
GFR calc Af Amer: >60 mL/min (ref >60)
GFR calc non Af Amer: 58 mL/min — ABNORMAL LOW (ref >60)
Glucose, Bld: 103 mg/dL — ABNORMAL HIGH (ref 70–99)
Potassium: 3.6 mmol/L (ref 3.5–5.1)
Sodium: 137 mmol/L (ref 135–145)

## 2019-12-27 LAB — URINALYSIS, ROUTINE W REFLEX MICROSCOPIC
Bilirubin Urine: NEGATIVE
Glucose, UA: NEGATIVE mg/dL
Hgb urine dipstick: NEGATIVE
Ketones, ur: NEGATIVE mg/dL
Leukocytes,Ua: NEGATIVE
Nitrite: NEGATIVE
Protein, ur: NEGATIVE mg/dL
Specific Gravity, Urine: 1.017 (ref 1.005–1.030)
pH: 5 (ref 5.0–8.0)

## 2019-12-27 LAB — CBC
HCT: 36.9 % — ABNORMAL LOW (ref 39.0–52.0)
Hemoglobin: 11.6 g/dL — ABNORMAL LOW (ref 13.0–17.0)
MCH: 20.3 pg — ABNORMAL LOW (ref 26.0–34.0)
MCHC: 31.4 g/dL (ref 30.0–36.0)
MCV: 64.6 fL — ABNORMAL LOW (ref 80.0–100.0)
Platelets: 158 10*3/uL (ref 150–400)
RBC: 5.71 MIL/uL (ref 4.22–5.81)
RDW: 17.5 % — ABNORMAL HIGH (ref 11.5–15.5)
WBC: 5.3 10*3/uL (ref 4.0–10.5)
nRBC: 0 % (ref 0.0–0.2)

## 2019-12-27 LAB — MAGNESIUM: Magnesium: 2.1 mg/dL (ref 1.7–2.4)

## 2019-12-27 MED ORDER — RIVAROXABAN 20 MG PO TABS
20.0000 mg | ORAL_TABLET | Freq: Every day | ORAL | 0 refills | Status: DC
Start: 2019-12-27 — End: 2020-06-06

## 2019-12-27 MED ORDER — AMLODIPINE BESYLATE 10 MG PO TABS: 10.0000 mg | ORAL_TABLET | Freq: Every day | ORAL | 0 refills | Status: DC

## 2019-12-27 NOTE — Progress Notes (Signed)
PT EKG alarm sounding. Initially looked to be ectopy but then I saw a five beat run of v tach. I went in the patients room, asked him if he felt ok and he had a hard time answering me and replied "I don't know" At this time his heart rate jumped from the 60's into the 110's and he began having what appeared to be v tach. I got the ekg for verification, hit the button for e link and waited for my charge nurse to come to the room. Additional lab orders placed to check for electrolyte imbalance. EKG reading appeared normal, and read the same as the one I did yesterday. No other orders placed now. I think it is also important to note that the patient urinated on himself without realizing, when he is otherwise continent of bladder. No new orders at this time. Will continue to assess for change in status or condition.

## 2019-12-27 NOTE — Progress Notes (Addendum)
PROGRESS NOTE  Shane [name]:[id number] DOB: [birth year] DOA: 12/25/2019 PCP: [name], MD  Brief History   74 year old man PMH right femoral vein DVT 2020 on Xarelto, asthma, followed by Dr. Percival Spanish in past for palpitations of unclear significance, murmur, presented with left-sided chest pain  A & P  Hypertensive emergency with systolic blood pressure over 465, diastolic over 681 on admission with associated acute kidney injury, chest pain and back pain.  CTA chest negative for dissection and pulmonary embolism.  Initial troponin negative.  EKG nonischemic.  Treated with nitroglycerin infusion with improvement to goal. --Inciting factor unclear.  Echocardiogram reassuring.  Blood pressure now stable off nitroglycerin infusion.  Continue amlodipine.  Bradycardia may preclude beta-blocker  Chest pain in the context of hypertensive emergency, ongoing, concern for ACS. --Troponins negative.  EKGs nonacute.  ACS seems unlikely. --Etiology unclear, appreciate cardiology evaluation, consideration being given to nuclear stress test.   --Continue aspirin and enoxaparin for now, resume Xarelto after testing complete.  Mid and low left back pain of unclear etiology.  History most suggestive of muscular strain as it developed after the lifting of a heavy basket.  Nonspecific findings on CT of unclear etiology.  He has no urinary symptoms, no fever, no leukocytosis and no CVA tenderness. --Supportive care.  Acute kidney injury secondary to hypertensive emergency. --Resolved  GERD --PPI  Asthma persistent --Appears stable.  Continue bronchodilators, montelukast  Mild anemia with marked microcytosis out of proportion to anemia, consistent with thalassemia.  Mother with history of thalassemia. --Follow-up as an outpatient.  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 --Hold Xarelto while on Lovenox.  OSA --CPAP QHS  Disposition Plan:  Discussion: Blood pressure appears now stable  off nitroglycerin.  Repeat EKG nonacute.  Await further cardiology recommendations.  Anticipate discharge next 24 hours pending further cardiology recommendations and improvement.  Status is: Inpatient  Remains inpatient appropriate because:IV treatments appropriate due to intensity of illness or inability to take PO and Inpatient level of care appropriate due to severity of illness   Dispo: The patient is from: Home              Anticipated d/c is to: Home              Anticipated d/c date is: 1 day              Patient currently is not medically stable to d/c.  DVT prophylaxis: Enoxaparin Code Status: Full Family Communication: none  Murray Hodgkins, MD  [facility] Direct contact: see [email or web address] (further directions at bottom of note if needed) 7PM-7AM contact night coverage as at bottom of note 12/27/2019, 10:47 AM  LOS: 2 days   Significant Hospital Events   . 6/26 admitted for hypertensive emergency   Consults:  . Cardiology    Procedures:  .   Significant Diagnostic Tests:  . Echocardiogram LVEF 65-70%.  Indeterminate diastolic parameters.  Normal RV systolic function.  No significant valvular abnormalities noted.   Micro Data:  .    Antimicrobials:  .   Interval History/Subjective  Nurse overnight documented episode of V. tach, urinary incontinence Patient feels better today although still has left-sided chest pain as well as pain radiating around the left side of the chest and down to the low back. No dysuria.  Reports he moved a heavy basket on the day of admission, he did not have immediate pain, but later that same day he developed the pain in chest and back.  Objective  Vitals:  Vitals:   12/27/19 0900 12/27/19 1000  BP: (!) 155/81 136/77  Pulse: 61 65  Resp: 14 17  Temp:    SpO2: 91% 93%    Exam:  Constitutional.  Appears calm and comfortable. Respiratory.  Clear to auscultation bilaterally.  No wheezes, rales or rhonchi.  Normal  respiratory effort. Cardiovascular.  Regular rate and rhythm.  No murmur, rub or gallop.  No change in 2+ brawny edema bilateral lower extremities telemetry sinus rhythm.  And I do not see any VT recorded. Skin.  Chest wall, back and low back appear unremarkable. No tenderness with palpation of the upper mid or low back.  No CVA tenderness on the left. Psychiatric.  Grossly normal mood and affect.  Speech fluent and appropriate.  I have personally reviewed the following:   Today's Data  . Creatinine down to 1.21, remainder BMP unremarkable. . CBC unremarkable, stable  Scheduled Meds: . amLODipine  10 mg Oral Daily  . aspirin  81 mg Oral Daily  . Chlorhexidine Gluconate Cloth  6 each Topical Daily  . enoxaparin (LOVENOX) injection  120 mg Subcutaneous BID  . fluticasone furoate-vilanterol  1 puff Inhalation Daily  . montelukast  10 mg Oral QHS  . pantoprazole  40 mg Oral Daily  . triamcinolone  1 spray Nasal Daily   Continuous Infusions: . sodium chloride 75 mL/hr at 12/27/19 0800    Principal Problem:   Hypertensive emergency Active Problems:   Microcytic anemia   GERD   Sinusitis, chronic   Bradycardia   Right femoral vein DVT (HCC)   Moderate persistent asthma   ACS (acute coronary syndrome) ([place])   AKI (acute kidney injury) ([place])   LOS: 2 days   How to contact the [facility] Attending or Consulting provider [place] or covering provider during after hours [place], for this patient?  1. Check the care team in [facility] and look for a) attending/consulting [facility] provider listed and b) the [facility] team listed 2. Log into [email or web address] and use [facility] universal password to access. If you do not have the password, please contact the hospital operator. 3. Locate the [facility] provider you are looking for under [facility] and page to a number that you can be directly reached. 4. If you still have difficulty reaching the provider, please page the [facility] (Director on Call) for the Hospitalists  listed on amion for assistance.

## 2019-12-27 NOTE — Telephone Encounter (Signed)
Patient is scheduled for a TOC appointment on 01/10/20 at 9:00 AM per Roby Lofts.

## 2019-12-27 NOTE — Telephone Encounter (Signed)
Currently admitted.

## 2019-12-27 NOTE — Discharge Summary (Signed)
Physician Discharge Summary  Shane [name] ZOX:[id number] DOB: 1946/03/19 DOA: 12/25/2019  PCP: [name], MD  Admit date: 12/25/2019 Discharge date: 12/27/2019  Recommendations for Outpatient Follow-up:  1. Essential hypertension.  Amlodipine started. 2. History of DVT June 2020, see discussion below 3. Atypical chest pain, consider outpatient study   Follow-up Information    Minus Breeding, MD Follow up on 01/10/2020.   Specialty: Cardiology Why: Please arrive 15 minutes early for your 9am post-hospital follow-up appointment.  Contact information: [address] [place] [address] [phone]        [name], MD. Schedule an appointment as soon as possible for a visit in 1 week(s).   Specialty: Internal Medicine Contact information: [place] [address] [phone]                Discharge Diagnoses: Principal diagnosis is #1 1. Hypertensive emergency 2. Atypical chest pain, persistent, likely musculoskeletal.  ACS ruled out. 3. Acute kidney injury secondary to hypertensive emergency. 4. GERD 5. Asthma persistent 6. Mild anemia with marked microcytosis out of proportion to anemia, consistent with thalassemia 7. 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  Discharge Condition: improved Disposition: home  Diet recommendation: heart healthy  Filed Weights   12/25/19 0008 12/26/19 0526  Weight: 122.5 kg 119.5 kg    History of present illness:  74 year old man PMH right femoral vein DVT 2020 on Xarelto, asthma, followed by Dr. Percival Spanish in past for palpitations of unclear significance, murmur, presented with left-sided chest pain  Hospital Course:  Patient was admitted, treated with nitroglycerin infusion with gradual improvement in blood pressure, then was transitioned to amlodipine.  Because of chest pain he was seen by cardiology, repeat troponins were negative and EKG nonacute.  History more suggestive  of muscular pain after patient reported pain began sometime after lifting a heavy basket same day.  Pain aggravated by movement.  Exam benign, skin over the chest and back appears unremarkable.  Head no back pain or CVA tenderness with palpation.  Urinalysis was negative x2 and he had no urinary symptoms.  Mild nonspecific findings of the left perinephric region of unclear significance but no evidence of infection.  Following day the patient was much improved and discharged home in stable condition.  Hypertensive emergency with systolic blood pressure over 846, diastolic over 962 on admission with associated acute kidney injury, chest pain and back pain.  CTA chest negative for dissection and pulmonary embolism.  Initial troponin negative.  EKG nonischemic.  Treated with nitroglycerin infusion with improvement to goal. --Responded well to nitroglycerin infusion.  Echocardiogram reassuring --Home on amlodipine  Atypical chest pain, persistent, likely musculoskeletal.  ACS ruled out. --Troponins negative.  EKG is nonacute. --Cardiology evaluation appreciated.  Plan for outpatient follow-up.  Mid and low left back pain of unclear etiology.  History most suggestive of muscular strain as it developed after the lifting of a heavy basket.  Nonspecific findings on CT of unclear etiology.  He has no urinary symptoms, no fever, no leukocytosis and no CVA tenderness. --Supportive care.  Acute kidney injury secondary to hypertensive emergency. --Resolved with IV fluids  GERD --PPI  Asthma persistent --Stable. Continue bronchodilators, montelukast  Mild anemia with marked microcytosis out of proportion to anemia, consistent with thalassemia.  Mother with history of thalassemia. --Follow-up as an outpatient.  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 --Patient has not been taking Xarelto properly at home.  He was taking 15 mg twice daily and  had not yet transitioned to 20 mg nightly despite being prescribed  this almost a year ago.  I discussed in detail with him proper dosing, he will follow-up with his primary care physician to discuss.  I recommended 20 mg daily.   Significant Hospital Events    6/26 admitted for hypertensive emergency  Consults:   Cardiology   Procedures:   None  Significant Diagnostic Tests:   Echocardiogram LVEF 65-70%.  Indeterminate diastolic parameters.  Normal RV systolic function.  No significant valvular abnormalities noted.  Today's assessment: S: Feels a lot better today.  Still has some chest pain which is worse with movement.  Also some back pain which is worse with movement.  Breathing okay.  No dysuria.  Question of NSVT overnight. O: Vitals:  Vitals:   12/27/19 1300 12/27/19 1400  BP: 111/80 115/73  Pulse: 71 66  Resp: 11 16  Temp:    SpO2: 95% 96%    Constitutional:  . Appears calm and comfortable Respiratory:  . CTA bilaterally, no w/r/r.  . Respiratory effort normal.  Cardiovascular:  . RRR, no m/r/g . No change in bilateral lower extremity brawny edema  .  Musculoskeletal:  . Observed walking with physical therapy.  Did quite well. Psychiatric:  . Mental status o Mood, affect appropriate  Discharge Instructions  Discharge Instructions    Diet - low sodium heart healthy   Complete by: As directed    Discharge instructions   Complete by: As directed    Call your physician or seek immediate medical attention for increasing pain, shortness of breath, swelling or worsening of condition.   Increase activity slowly   Complete by: As directed      Allergies as of 12/27/2019   No Known Allergies     Medication List    TAKE these medications   albuterol 108 (90 Base) MCG/ACT inhaler Commonly known as: VENTOLIN HFA TAKE 2 PUFFS BY MOUTH EVERY 6 HOURS AS NEEDED FOR WHEEZE OR SHORTNESS OF BREATH What changed: See the new instructions.   alum & mag hydroxide-simeth 169-678-93 MG/5ML suspension Commonly known as: Mylanta  Maximum Strength Take 5 mLs by mouth every 6 (six) hours as needed (abdominal pain or indigestion).   amLODipine 10 MG tablet Commonly known as: NORVASC Take 1 tablet (10 mg total) by mouth daily. Start taking on: December 28, 2019   amoxicillin-clavulanate 875-125 MG tablet Commonly known as: AUGMENTIN Take 1 tablet by mouth 2 (two) times daily.   Breo Ellipta 200-25 MCG/INH Aepb Generic drug: fluticasone furoate-vilanterol TAKE 1 PUFF BY MOUTH EVERY DAY What changed: See the new instructions.   esomeprazole 40 MG capsule Commonly known as: NexIUM Take 1 capsule (40 mg total) by mouth 2 (two) times daily before a meal. What changed: Another medication with the same name was removed. Continue taking this medication, and follow the directions you see here.   montelukast 10 MG tablet Commonly known as: SINGULAIR TAKE 1 TABLET BY MOUTH EVERYDAY AT BEDTIME What changed: See the new instructions.   predniSONE 10 MG tablet Commonly known as: DELTASONE 2 tablets once a day for 4 days What changed:   how much to take  how to take this  when to take this   rivaroxaban 20 MG Tabs tablet Commonly known as: XARELTO Take 1 tablet (20 mg total) by mouth daily with supper. What changed:   medication strength  how much to take  when to take this   SYSTANE BALANCE OP Apply 1 drop  to eye daily as needed (dry eyes). Reported on 12/19/2015   triamcinolone 55 MCG/ACT Aero nasal inhaler Commonly known as: Nasacort Allergy 24HR Use one spray in each nostril once daily as directed. What changed:   how much to take  how to take this  when to take this  additional instructions      No Known Allergies  The results of significant diagnostics from this hospitalization (including imaging, microbiology, ancillary and laboratory) are listed below for reference.    Significant Diagnostic Studies: DG Chest 2 View  Result Date: 12/25/2019 CLINICAL DATA:  Upper back pain radiating to  the left flank EXAM: CHEST - 2 VIEW COMPARISON:  02/16/2019 FINDINGS: The heart size and mediastinal contours are within normal limits. Both lungs are clear. The visualized skeletal structures are unremarkable. IMPRESSION: No active cardiopulmonary disease. Electronically Signed   By: Ulyses Jarred M.D.   On: 12/25/2019 05:15   ECHOCARDIOGRAM COMPLETE  Result Date: 12/25/2019    ECHOCARDIOGRAM REPORT   Patient Name:   Shane [name] Date of Exam: 12/25/2019 Medical Rec #:  [id number]          Height:       74.0 in Accession #:    [id number]         Weight:       270.0 lb Date of Birth:  [birth year]          BSA:          2.469 m Patient Age:    74 years           BP:           179/89 mmHg Patient Gender: M                  HR:           63 bpm. Exam Location:  Inpatient Procedure: 2D Echo, Cardiac Doppler and Color Doppler Indications:    R07.9* Chest pain, unspecified; I10 Hypertension  History:        Patient has prior history of Echocardiogram examinations, most                 recent 09/02/2018. Risk Factors:Sleep Apnea and GERD. Back pain.  Sonographer:    Tiffany Dance Referring Phys: [id number] [place]  1. Left ventricular ejection fraction, by estimation, is 65 to 70%. The left ventricle has normal function. The left ventricle has no regional wall motion abnormalities. There is moderate asymmetric left ventricular hypertrophy of the septal segment. Left ventricular diastolic parameters are indeterminate.  2. Right ventricular systolic function is normal. The right ventricular size is normal. Tricuspid regurgitation signal is inadequate for assessing PA pressure.  3. The mitral valve is grossly normal. Trivial mitral valve regurgitation.  4. The aortic valve is tricuspid, mildly calcified annulus. Aortic valve regurgitation is not visualized.  5. The inferior vena cava is normal in size with greater than 50% respiratory variability, suggesting right atrial pressure of 3 mmHg. FINDINGS   Left Ventricle: Left ventricular ejection fraction, by estimation, is 65 to 70%. The left ventricle has normal function. The left ventricle has no regional wall motion abnormalities. The left ventricular internal cavity size was normal in size. There is  moderate asymmetric left ventricular hypertrophy of the septal segment. Left ventricular diastolic parameters are indeterminate. Right Ventricle: The right ventricular size is normal. No increase in right ventricular wall thickness. Right ventricular systolic function is normal. Tricuspid regurgitation signal is inadequate for assessing  PA pressure. Left Atrium: Left atrial size was normal in size. Right Atrium: Right atrial size was normal in size. Pericardium: There is no evidence of pericardial effusion. Mitral Valve: The mitral valve is grossly normal. Mild mitral annular calcification. Trivial mitral valve regurgitation. Tricuspid Valve: The tricuspid valve is grossly normal. Tricuspid valve regurgitation is mild. Aortic Valve: The aortic valve is tricuspid. Aortic valve regurgitation is not visualized. Aortic regurgitation PHT measures 389 msec. Mild aortic valve annular calcification. Pulmonic Valve: The pulmonic valve was grossly normal. Pulmonic valve regurgitation is mild. Aorta: The aortic root is normal in size and structure. Venous: The inferior vena cava is normal in size with greater than 50% respiratory variability, suggesting right atrial pressure of 3 mmHg. IAS/Shunts: No atrial level shunt detected by color flow Doppler.  LEFT VENTRICLE PLAX 2D LVIDd:         4.60 cm  Diastology LVIDs:         3.60 cm  LV e' lateral:   8.27 cm/s LV PW:         1.10 cm  LV E/e' lateral: 7.5 LV IVS:        1.40 cm  LV e' medial:    5.55 cm/s LVOT diam:     2.00 cm  LV E/e' medial:  11.2 LV SV:         101 LV SV Index:   41 LVOT Area:     3.14 cm  RIGHT VENTRICLE             IVC RV Basal diam:  3.00 cm     IVC diam: 1.80 cm RV S prime:     16.90 cm/s TAPSE (M-mode):  1.9 cm LEFT ATRIUM             Index       RIGHT ATRIUM           Index LA diam:        4.60 cm 1.86 cm/m  RA Area:     15.50 cm LA Vol (A2C):   69.2 ml 28.03 ml/m RA Volume:   37.50 ml  15.19 ml/m LA Vol (A4C):   43.7 ml 17.70 ml/m LA Biplane Vol: 55.0 ml 22.28 ml/m  AORTIC VALVE LVOT Vmax:   154.00 cm/s LVOT Vmean:  92.300 cm/s LVOT VTI:    0.320 m AI PHT:      389 msec  AORTA Ao Root diam: 3.40 cm Ao Asc diam:  3.40 cm MITRAL VALVE MV Area (PHT): 2.01 cm    SHUNTS MV Decel Time: 377 msec    Systemic VTI:  0.32 m MV E velocity: 62.00 cm/s  Systemic Diam: 2.00 cm MV A velocity: 82.40 cm/s MV E/A ratio:  0.75 Rozann Lesches MD Electronically signed by Rozann Lesches MD Signature Date/Time: 12/25/2019/2:12:56 PM    Final    CT Angio Chest/Abd/Pel for Dissection W and/or Wo Contrast  Result Date: 12/25/2019 CLINICAL DATA:  Left upper back pain radiating to the flank. Evaluate for dissection. EXAM: CT ANGIOGRAPHY CHEST, ABDOMEN AND PELVIS TECHNIQUE: Non-contrast CT of the chest was initially obtained. Multidetector CT imaging through the chest, abdomen and pelvis was performed using the standard protocol during bolus administration of intravenous contrast. Multiplanar reconstructed images and MIPs were obtained and reviewed to evaluate the vascular anatomy. CONTRAST:  173mL OMNIPAQUE IOHEXOL 350 MG/ML SOLN COMPARISON:  CT abdomen pelvis-06/10/2019; 10/24/2018; 03/07/2017 FINDINGS: Vascular Findings: Mild fusiform ectasia of the ascending thoracic aorta with measurements as follows. No evidence  of thoracic aortic dissection or periaortic stranding on this nongated examination. Review of the precontrast images is negative for the presence of an intramural hematoma. Conventional configuration of the aortic arch. The branch vessels of the aortic arch appear widely patent throughout their imaged courses. Cardiomegaly.  No pericardial effusion. Although this examination was not tailored for the evaluation the  pulmonary arteries, there are no discrete filling defects within the central pulmonary arterial tree to suggest central pulmonary embolism. Normal caliber of the main pulmonary artery. ------------------------------------------------------------- Thoracic aortic measurements: Sinotubular junction 32 mm as measured in greatest oblique short axis coronal dimension. Proximal ascending aorta 39 mm as measured in greatest oblique short axis axial dimension at the level of the main pulmonary artery and approximately 37 mm in greatest oblique short axis coronal diameter (coronal image 69, series 9) Aortic arch aorta 32 mm as measured in greatest oblique short axis sagittal dimension. Proximal descending thoracic aorta 32 mm as measured in greatest oblique short axis axial dimension at the level of the main pulmonary artery. Distal descending thoracic aorta 30 mm as measured in greatest oblique short axis axial dimension at the level of the diaphragmatic hiatus. Review of the MIP images confirms the above findings. ------------------------------------------------------------- Non-Vascular Findings: Mediastinum/Lymph Nodes: No bulky mediastinal, hilar or axillary lymphadenopathy. Lungs/Pleura: Minimal dependent subpleural ground-glass atelectasis, worse within the left lower lobe. No discrete focal airspace opacities. No pleural effusion or pneumothorax. The central pulmonary airways appear widely patent. No discrete pulmonary nodules. Musculoskeletal: No acute or aggressive osseous abnormalities. Stigmata of dish within the thoracic spine. Normal appearance of the thyroid gland. Note is made of moderate symmetric bilateral gynecomastia. _________________________________________________________ _________________________________________________________ CTA ABDOMEN AND PELVIS FINDINGS VASCULAR Aorta: Scattered mixed calcified and noncalcified atherosclerotic plaque within normal caliber abdominal aorta, not resulting in  hemodynamically significant stenosis. No abdominal aortic dissection or periaortic stranding. Celiac: Widely patent without a hemodynamically significant narrowing. Conventional branching pattern. SMA: Widely patent without hemodynamically significant narrowing. Conventional branching pattern. Renals: Solitary bilaterally; there is a minimal amount of mixed calcified and noncalcified atherosclerotic plaque involving the origin of the bilateral renal arteries, not resulting in a hemodynamically significant stenosis. No vessel irregularity to suggest FMD. IMA: Widely patent without hemodynamically significant narrowing. Inflow: There is a minimal to moderate amount of mixed calcified and noncalcified atherosclerotic plaque involving the bilateral common iliac arteries, left greater than right, not resulting in a hemodynamically significant stenosis. The bilateral internal and external iliac arteries are of normal caliber and widely patent without hemodynamically significant narrowing. Minimal amount of atherosclerotic plaque involving the bilateral common femoral arteries, not resulting in hemodynamically significant stenosis. The imaged portions of the bilateral deep and superficial arteries are widely patent without a hemodynamically significant narrowing. Veins: The IVC and pelvic venous systems appear widely patent on this arterial phase examination. Review of the MIP images confirms the above findings. _________________________________________________________ NON-VASCULAR Evaluation of abdominal organs is limited to the arterial phase of enhancement. Hepatobiliary: Normal hepatic contour. No discrete hyperenhancing hepatic lesions. Normal appearance of the gallbladder given degree distention. No radiopaque gallstones. No intra or extrahepatic biliary ductal dilatation. No ascites. Pancreas: Normal appearance of the pancreas. Spleen: Normal appearance of the spleen. Adrenals/Urinary Tract: There is symmetric  enhancement of the bilateral kidneys. No definite renal stones on this postcontrast examination. No discrete renal lesions. There is a minimal amount of slightly asymmetric left-sided perinephric stranding without evidence of urinary obstruction. Mild thickening of the left adrenal gland without discrete nodule. Normal appearance of the right  adrenal gland. Mild thickening the urinary bladder wall, potentially accentuated due to underdistention. Stomach/Bowel: Large colonic stool burden without evidence of enteric obstruction. Normal appearance of the terminal ileum and the appendix. No pneumoperitoneum, pneumatosis or portal venous gas. Lymphatic: Scattered retroperitoneal lymph nodes are not enlarged by size criteria with index left-sided periaortic lymph node measuring 0.9 cm in greatest short axis diameter (image 114, series 6, presumably reactive in etiology. No bulky retroperitoneal, mesenteric, pelvic or inguinal lymph adenopathy. Reproductive: Dystrophic calcifications within normal sized prostate gland. No free fluid the pelvic cul-de-sac. Other: Small mesenteric fat containing left-sided indirect inguinal hernia. Tiny mesenteric fat containing periumbilical hernia. There is a minimal amount of subcutaneous edema about the midline of the low back. Musculoskeletal: No acute or aggressive osseous abnormalities. Mild to moderate multilevel lumbar spine DDD, worse at L3-L4 with disc space height loss, endplate irregularity and small posteriorly directed disc osteophyte complex at this location. Mild scoliotic curvature of the thoracolumbar spine. Review of the MIP images confirms the above findings. IMPRESSION: 1. Minimal amount of asymmetric left-sided perinephric stranding without evidence of urinary obstruction, nonspecific though could be seen in the setting of an early infection/pyelonephritis. Correlation with urinalysis is advised. 2. Otherwise, no explanation for patient's left upper back/flank pain. 3.  No acute cardiopulmonary disease. Specifically, no evidence of thoracic aortic dissection, intramural hematoma or central pulmonary embolism. 4. Mild fusiform ectasia of the ascending thoracic aorta measuring 39 mm in diameter. 5. Cardiomegaly. 6. Scattered atherosclerotic plaque within normal caliber abdominal aorta. Aortic Atherosclerosis (ICD10-I70.0). 7. 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 Electronically Signed   By: Sandi Mariscal M.D.   On: 12/25/2019 08:32    Microbiology: Recent Results (from the past 240 hour(s))  SARS Coronavirus 2 by RT PCR (hospital order, performed in [facility] lab) Nasopharyngeal Nasopharyngeal Swab     Status: None   Collection Time: 12/25/19  7:50 AM   Specimen: Nasopharyngeal Swab  Result Value Ref Range Status   SARS Coronavirus 2 NEGATIVE NEGATIVE Final    Comment: (NOTE) SARS-CoV-2 target nucleic acids are NOT DETECTED.  The SARS-CoV-2 RNA is generally detectable in upper and lower respiratory specimens during the acute phase of infection. The lowest concentration of SARS-CoV-2 viral copies this assay can detect is 250 copies / mL. A negative result does not preclude SARS-CoV-2 infection and should not be used as the sole basis for treatment or other patient management decisions.  A negative result may occur with improper specimen collection / handling, submission of specimen other than nasopharyngeal swab, presence of viral mutation(s) within the areas targeted by this assay, and inadequate number of viral copies (<250 copies / mL). A negative result must be combined with clinical observations, patient history, and epidemiological information.  Fact Sheet for Patients:   StrictlyIdeas.no  Fact Sheet for Healthcare Providers: BankingDealers.co.za  This test is not yet approved or  cleared by the [place] FDA and has been authorized for detection and/or diagnosis of  SARS-CoV-2 by FDA under an Emergency Use Authorization (EUA).  This EUA will remain in effect (meaning this test can be used) for the duration of the COVID-19 declaration under Section 564(b)(1) of the Act, 21 U.S.C. section 360bbb-3(b)(1), unless the authorization is terminated or revoked sooner.  Performed at [facility], [place] [address] [place], [place] [address]   MRSA PCR Screening     Status: None   Collection Time: 12/25/19  9:47 AM   Specimen: Nasal Mucosa; Nasopharyngeal  Result Value Ref Range Status   MRSA by PCR NEGATIVE NEGATIVE Final    Comment:        The GeneXpert MRSA Assay (FDA approved for NASAL specimens only), is one component of a comprehensive MRSA colonization surveillance program. It is not intended to diagnose MRSA infection nor to guide or monitor treatment for MRSA infections. Performed at [facility], [place] [address], [place], [place] [address]      Labs: Basic Metabolic Panel: Recent Labs  Lab 12/25/19 0440 12/26/19 0326 12/27/19 0352  NA 134* 136 137  K 3.8 4.3 3.6  CL 96* 99 103  CO2 26 25 30   GLUCOSE 149* 108* 103*  BUN 17 23 18   CREATININE 1.19 1.40* 1.21  CALCIUM 9.1 8.8* 8.3*  MG  --   --  2.1   Liver Function Tests: Recent Labs  Lab 12/25/19 0440  AST 28  ALT 14  ALKPHOS 96  BILITOT 0.8  PROT 8.3*  ALBUMIN 4.9   Recent Labs  Lab 12/25/19 0440  LIPASE 22   CBC: Recent Labs  Lab 12/25/19 0440 12/26/19 0326 12/27/19 0352  WBC 6.3 6.2 5.3  HGB 12.5* 12.0* 11.6*  HCT 40.2 38.2* 36.9*  MCV 64.1* 64.3* 64.6*  PLT 163 173 158    Recent Labs    02/16/19 0755 12/25/19 0440  BNP 44.9 86.0    Principal Problem:   Hypertensive emergency Active Problems:   Microcytic anemia   GERD   Sinusitis, chronic   Bradycardia   Right femoral vein DVT (HCC)   Moderate persistent asthma   ACS (acute coronary syndrome) ([place])   AKI (acute kidney injury) ([place])   Time  coordinating discharge: 50 minutes  Signed:  Murray Hodgkins, MD  [facility]  12/27/2019, 4:59 PM

## 2019-12-27 NOTE — Progress Notes (Signed)
PIV removed and patient wheeled down to personal car. No other questions from patient.

## 2019-12-27 NOTE — Progress Notes (Signed)
Discharge education provided to patient, all questions answered. Patient requested to get dressed and remove cardiac monitoring, but not to be discharged until after his dinner arrived (315pm). IV remains in place at this time.

## 2019-12-27 NOTE — Progress Notes (Signed)
Progress Note  Patient Name: Shane [name] Date of Encounter: 12/27/2019  [facility] Cardiologist: Minus Breeding, MD   Subjective   Continues to have mild chest and flank discomfort. Spent extensive time reviewing test results, causes of pain, etc. Breathing well.  Inpatient Medications    Scheduled Meds: . amLODipine  10 mg Oral Daily  . aspirin  81 mg Oral Daily  . Chlorhexidine Gluconate Cloth  6 each Topical Daily  . enoxaparin (LOVENOX) injection  120 mg Subcutaneous BID  . fluticasone furoate-vilanterol  1 puff Inhalation Daily  . montelukast  10 mg Oral QHS  . pantoprazole  40 mg Oral Daily  . triamcinolone  1 spray Nasal Daily   Continuous Infusions: . sodium chloride 75 mL/hr at 12/27/19 0800  . nitroGLYCERIN Stopped (12/26/19 1724)   PRN Meds: acetaminophen **OR** acetaminophen, albuterol, guaiFENesin, morphine injection, ondansetron **OR** ondansetron (ZOFRAN) IV, oxyCODONE, polyvinyl alcohol, senna-docusate, sodium chloride   Vital Signs    Vitals:   12/27/19 0400 12/27/19 0800 12/27/19 0900 12/27/19 1000  BP: (!) 148/78 133/60 (!) 155/81 136/77  Pulse: 66 66 61 65  Resp: 14 13 14 17   Temp: 98.5 F (36.9 C) 98 F (36.7 C)    TempSrc: Oral Oral    SpO2: 94% 92% 91% 93%  Weight:      Height:        Intake/Output Summary (Last 24 hours) at 12/27/2019 1041 Last data filed at 12/27/2019 0858 Gross per 24 hour  Intake 1982.33 ml  Output 375 ml  Net 1607.33 ml   Last 3 Weights 12/26/2019 12/25/2019 05/30/2019  Weight (lbs) 263 lb 7.2 oz 270 lb 262 lb  Weight (kg) 119.5 kg 122.471 kg 118.842 kg      Telemetry    Sinus rhythm. Reviewed concerning telemetry per nursing note. From 4-4:30 AM, there is significant artifact in several leads. However, lead V is well captured throughout and does not show any significant arrhythmia. 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   Neck: No JVD Cardiac: RRR, no murmurs, rubs, or gallops. Tender over left pectoral area. Respiratory: Clear to auscultation bilaterally. GI: Soft, nontender, non-distended  MS: chronic brawny edema; No deformity. Neuro:  Nonfocal  Psych: Normal affect   Labs    High Sensitivity Troponin:   Recent Labs  Lab 12/25/19 0440 12/26/19 1012  TROPONINIHS 16 15      Chemistry Recent Labs  Lab 12/25/19 0440 12/26/19 0326 12/27/19 0352  NA 134* 136 137  K 3.8 4.3 3.6  CL 96* 99 103  CO2 26 25 30   GLUCOSE 149* 108* 103*  BUN 17 23 18   CREATININE 1.19 1.40* 1.21  CALCIUM 9.1 8.8* 8.3*  PROT 8.3*  --   --   ALBUMIN 4.9  --   --   AST 28  --   --   ALT 14  --   --   ALKPHOS 96  --   --   BILITOT 0.8  --   --   GFRNONAA 59* 49* 58*  GFRAA >60 57* >60  ANIONGAP 12 12 4*     Hematology Recent Labs  Lab 12/25/19 0440 12/26/19 0326 12/27/19 0352  WBC 6.3 6.2 5.3  RBC 6.27* 5.94* 5.71  HGB 12.5* 12.0* 11.6*  HCT 40.2 38.2* 36.9*  MCV 64.1* 64.3* 64.6*  MCH 19.9* 20.2* 20.3*  MCHC 31.1 31.4 31.4  RDW 18.1* 18.2* 17.5*  PLT 163 173 158    BNP Recent Labs  Lab 12/25/19 0440  BNP 86.0     DDimer No results for input(s): DDIMER in the last 168 hours.   Radiology    ECHOCARDIOGRAM COMPLETE  Result Date: 12/25/2019    ECHOCARDIOGRAM REPORT   Patient Name:   Shane [name] Date of Exam: 12/25/2019 Medical Rec #:  [id number]          Height:       74.0 in Accession #:    [id number]         Weight:       270.0 lb Date of Birth:  04/07/1946          BSA:          2.469 m Patient Age:    74 years           BP:           179/89 mmHg Patient Gender: M                  HR:           63 bpm. Exam Location:  Inpatient Procedure: 2D Echo, Cardiac Doppler and Color Doppler Indications:    R07.9* Chest pain, unspecified; I10 Hypertension  History:        Patient has prior history of Echocardiogram examinations, most                  recent 09/02/2018. Risk Factors:Sleep Apnea and GERD. Back pain.  Sonographer:    Tiffany Dance Referring Phys: [id number] [place]  1. Left ventricular ejection fraction, by estimation, is 65 to 70%. The left ventricle has normal function. The left ventricle has no regional wall motion abnormalities. There is moderate asymmetric left ventricular hypertrophy of the septal segment. Left ventricular diastolic parameters are indeterminate.  2. Right ventricular systolic function is normal. The right ventricular size is normal. Tricuspid regurgitation signal is inadequate for assessing PA pressure.  3. The mitral valve is grossly normal. Trivial mitral valve regurgitation.  4. The aortic valve is tricuspid, mildly calcified annulus. Aortic valve regurgitation is not visualized.  5. The inferior vena cava is normal in size with greater than 50% respiratory variability, suggesting right atrial pressure of 3 mmHg. FINDINGS  Left Ventricle: Left ventricular ejection fraction, by estimation, is 65 to 70%. The left ventricle has normal function. The left ventricle has no regional wall motion abnormalities. The left ventricular internal cavity size was normal in size. There is  moderate asymmetric left ventricular hypertrophy of the septal segment. Left ventricular diastolic parameters are indeterminate. Right Ventricle: The right ventricular size is normal. No increase in right ventricular wall thickness. Right ventricular systolic function is normal. Tricuspid regurgitation signal is inadequate for assessing PA pressure. Left Atrium: Left atrial size was normal in size. Right Atrium: Right atrial size was normal in size. Pericardium: There is no evidence of pericardial effusion. Mitral Valve: The mitral valve is grossly normal. Mild mitral annular calcification. Trivial mitral valve regurgitation. Tricuspid Valve: The tricuspid valve is grossly normal. Tricuspid valve regurgitation is mild.  Aortic Valve: The aortic valve is tricuspid. Aortic valve regurgitation is not visualized. Aortic regurgitation PHT measures 389 msec. Mild aortic valve annular calcification. Pulmonic Valve: The pulmonic valve was grossly normal. Pulmonic valve regurgitation is mild. Aorta: The aortic root is normal in size and structure. Venous:  The inferior vena cava is normal in size with greater than 50% respiratory variability, suggesting right atrial pressure of 3 mmHg. IAS/Shunts: No atrial level shunt detected by color flow Doppler.  LEFT VENTRICLE PLAX 2D LVIDd:         4.60 cm  Diastology LVIDs:         3.60 cm  LV e' lateral:   8.27 cm/s LV PW:         1.10 cm  LV E/e' lateral: 7.5 LV IVS:        1.40 cm  LV e' medial:    5.55 cm/s LVOT diam:     2.00 cm  LV E/e' medial:  11.2 LV SV:         101 LV SV Index:   41 LVOT Area:     3.14 cm  RIGHT VENTRICLE             IVC RV Basal diam:  3.00 cm     IVC diam: 1.80 cm RV S prime:     16.90 cm/s TAPSE (M-mode): 1.9 cm LEFT ATRIUM             Index       RIGHT ATRIUM           Index LA diam:        4.60 cm 1.86 cm/m  RA Area:     15.50 cm LA Vol (A2C):   69.2 ml 28.03 ml/m RA Volume:   37.50 ml  15.19 ml/m LA Vol (A4C):   43.7 ml 17.70 ml/m LA Biplane Vol: 55.0 ml 22.28 ml/m  AORTIC VALVE LVOT Vmax:   154.00 cm/s LVOT Vmean:  92.300 cm/s LVOT VTI:    0.320 m AI PHT:      389 msec  AORTA Ao Root diam: 3.40 cm Ao Asc diam:  3.40 cm MITRAL VALVE MV Area (PHT): 2.01 cm    SHUNTS MV Decel Time: 377 msec    Systemic VTI:  0.32 m MV E velocity: 62.00 cm/s  Systemic Diam: 2.00 cm MV A velocity: 82.40 cm/s MV E/A ratio:  0.75 Rozann Lesches MD Electronically signed by Rozann Lesches MD Signature Date/Time: 12/25/2019/2:12:56 PM    Final     Cardiac Studies   Echo 12/25/19 1. Left ventricular ejection fraction, by estimation, is 65 to 70%. The  left ventricle has normal function. The left ventricle has no regional  wall motion abnormalities. There is moderate asymmetric  left ventricular  hypertrophy of the septal segment.  Left ventricular diastolic parameters are indeterminate.  2. Right ventricular systolic function is normal. The right ventricular  size is normal. Tricuspid regurgitation signal is inadequate for assessing  PA pressure.  3. The mitral valve is grossly normal. Trivial mitral valve  regurgitation.  4. The aortic valve is tricuspid, mildly calcified annulus. Aortic valve  regurgitation is not visualized.  5. The inferior vena cava is normal in size with greater than 50%  respiratory variability, suggesting right atrial pressure of 3 mmHg.  Patient Profile     74 y.o. male with PMH OSA, hypertension, prior DVT (2020) admitted with shoulder pain and hypertensive urgency. Cardiology consulted.   Assessment & Plan    Hypertensive urgency: -presenting blood pressure 232/178 in the setting of severe pain -has had labile BP here, range in last 24 hours 90/69-176/77. Currently 133/60 -agree with amlodipine 10 mg daily -would aim for gradual reduction, long term <130/80 but may be reasonable to aim for 784O systolic  this admission -next medication would be chlorthalidone, but would need to monitor K closely (3.6 today) and likely supplement. This can be followed as an outpatient  Chest pain/shoulder pain/flank pain: -hsTnI 16 -> 15, unremarkable -echo with normal EF and no regional wall motion abnormalities -CTA without acute cardiopulmonary etiology. Does show aortic atherosclerosis.  -given his severely elevated BP on admission, somewhat surprising that he does not have demand ischemia -atypical pain, tender to palpation.  -We spent significant time today reviewing his pain, the results of his testing, and alternative diagnoses. Reviewed echo and troponins at length. We also discussed that if testing is unrevealing for a cardiac cause of the symptoms, there are many noncardiac causes as well that can contribute to symptoms. Given the  tenderness, suspect MSK, though on CT there was note of some minimal left sided perinephric stranding. Defer to primary team if additional evaluation needed. -recommend outpatient stress test for further evaluation (better quality study than inpatient), though suspicion is low given hsTn and echo as above. Could also consider CT coronary. 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 Study is not able to quantify calcium, but given this would consider lexiscan nuclear stress to evaluate for flow limited ischemia if additional workup considered. -no acute indications for cath  Aortic atherosclerosis -ok to stop aspirin as he is on rivaroxaban chronically -consider statin initiation as an outpatient  History of prior DVT: -on rivaroxaban  OSA: -recommend CPAP  Total time of encounter: 42 minutes total time of encounter, including 27 minutes spent in face-to-face patient care. This time includes coordination of care and counseling regarding test results, causes of pain, management. Remainder of non-face-to-face time involved reviewing chart documents/testing relevant to the patient encounter and documentation in the medical record. Also includes personal image review of his CT and echo.  Buford Dresser, MD, PhD [place]  [facility]   [facility] will sign off.   Medication Recommendations:  Amlodipine as above. Consider chlorthalidone (with K monitoring) if additional agent needed. Other recommendations (labs, testing, etc):  none Follow up as an outpatient:  We will arrange for follow up with Dr. Percival Spanish. Would discuss statin initiation given overall risk/aortic atherosclerosis/coronary calcium, with possible outpatient stress test as well.  For questions or updates, please contact [place] Please consult [email or web address] for contact info under     Signed, Buford Dresser, MD  12/27/2019, 10:41 AM

## 2019-12-27 NOTE — Evaluation (Signed)
Physical Therapy Evaluation Patient Details Name: Shane [name] MRN: [id number] DOB: 13-Nov-1945 Today's Date: 12/27/2019   History of Present Illness  74 year old man PMH right femoral vein DVT 2020 on Xarelto, asthma, presented with left-sided chest pain, hypertensive .  Clinical Impression  The patient ambulated x 200', pushed IV pole, did ambulate also without IV in room. Patient lives alone and should progress to return home independently.  Pt admitted with above diagnosis.  Pt currently with functional limitations due to the deficits listed below (see PT Problem List). Pt will benefit from skilled PT to increase their independence and safety with mobility to allow discharge to the venue listed below.       Follow Up Recommendations No PT follow up    Equipment Recommendations  None recommended by PT    Recommendations for Other Services       Precautions / Restrictions Precautions Precautions: None      Mobility  Bed Mobility               General bed mobility comments: in recliner  Transfers Overall transfer level: Independent                  Ambulation/Gait Ambulation/Gait assistance: Min guard Gait Distance (Feet): 200 Feet Assistive device: 1 person hand held assist;IV Pole Gait Pattern/deviations: Step-through pattern   Gait velocity interpretation: <1.31 ft/sec, indicative of household ambulator General Gait Details: generally steady gait, pushed IV pole , did ambulate without  it also.  Stairs            Wheelchair Mobility    Modified Rankin (Stroke Patients Only)       Balance Overall balance assessment: No apparent balance deficits (not formally assessed)                                           Pertinent Vitals/Pain      Home Living Family/patient expects to be discharged to:: Private residence Living Arrangements: Alone   Type of Home: House Home Access: Stairs to enter   Engineer, site of Steps: 3 Home Layout: One level Home Equipment: None      Prior Function Level of Independence: Independent         Comments: does get help with mowing     Hand Dominance        Extremity/Trunk Assessment   Upper Extremity Assessment Upper Extremity Assessment: Overall WFL for tasks assessed    Lower Extremity Assessment Lower Extremity Assessment: Overall WFL for tasks assessed    Cervical / Trunk Assessment Cervical / Trunk Assessment: Normal  Communication   Communication: No difficulties  Cognition Arousal/Alertness: Awake/alert Behavior During Therapy: WFL for tasks assessed/performed Overall Cognitive Status: Within Functional Limits for tasks assessed                                        General Comments      Exercises     Assessment/Plan    PT Assessment Patient needs continued PT services  PT Problem List Decreased mobility;Decreased activity tolerance       PT Treatment Interventions Gait training;Functional mobility training    PT Goals (Current goals can be found in the Care Plan section)  Acute Rehab PT Goals Patient Stated Goal: get back to  feeling  normal PT Goal Formulation: With patient Time For Goal Achievement: 01/10/20 Potential to Achieve Goals: Good    Frequency Min 3X/week   Barriers to discharge        Co-evaluation               AM-PAC PT "6 Clicks" Mobility  Outcome Measure Help needed turning from your back to your side while in a flat bed without using bedrails?: None Help needed moving from lying on your back to sitting on the side of a flat bed without using bedrails?: None Help needed moving to and from a bed to a chair (including a wheelchair)?: None Help needed standing up from a chair using your arms (e.g., wheelchair or bedside chair)?: None Help needed to walk in hospital room?: A Little Help needed climbing 3-5 steps with a railing? : A Little 6 Click Score: 22     End of Session   Activity Tolerance: Patient tolerated treatment well Patient left: in chair;with call bell/phone within reach;with chair alarm set Nurse Communication: Mobility status PT Visit Diagnosis: Difficulty in walking, not elsewhere classified (R26.2)    Time: 0149-9692 PT Time Calculation (min) (ACUTE ONLY): 18 min   Charges:   PT Evaluation $PT Eval Low Complexity: [place] Pager [phone] Office [phone]   Claretha Cooper 12/27/2019, 1:35 PM

## 2019-12-28 ENCOUNTER — Telehealth: Payer: Self-pay | Admitting: *Deleted

## 2019-12-28 NOTE — Telephone Encounter (Signed)
Pt was on TCM report admitted 12/25/19 w/Left side chest pain. Pt was treated w/nitroglycerin infusion with gradual improvement in blood pressure. CTA chest negative for dissection and pulmonary embolism. Initial troponin negative. EKG nonischemic. Pt responded well to nitroglycerin infusion.  Echocardiogram reassuring. Pt D/C 12/27/19 home on amlodipine. Pt has hosp f/u w/cardiology on 01/10/20.Marland KitchenJohny Chess

## 2019-12-28 NOTE — Telephone Encounter (Signed)
Patient contacted regarding discharge from [facility] on 6/28.  Patient understands to follow up with provider Dr. Percival Spanish on 01/10/20 at [place] [facility]. Patient understands discharge instructions? yes Patient understands medications and regiment? yes Patient understands to bring all medications to this visit? yes  Pt state he would like to cancel appointment and reschedule for a later time. Appointment rescheduled for 01/06/20 at 12:20 pm.

## 2020-01-05 ENCOUNTER — Other Ambulatory Visit: Payer: Self-pay

## 2020-01-05 MED ORDER — ESOMEPRAZOLE MAGNESIUM 40 MG PO CPDR: 40.0000 mg | DELAYED_RELEASE_CAPSULE | Freq: Two times a day (BID) | ORAL | 5 refills | Status: DC

## 2020-01-06 ENCOUNTER — Ambulatory Visit: Payer: PPO | Admitting: Cardiology

## 2020-01-10 ENCOUNTER — Ambulatory Visit: Payer: PPO | Admitting: Cardiology

## 2020-01-14 ENCOUNTER — Other Ambulatory Visit: Payer: Self-pay | Admitting: Allergy and Immunology

## 2020-02-03 DIAGNOSIS — Z7189 Other specified counseling: Secondary | ICD-10-CM | POA: Insufficient documentation

## 2020-02-03 DIAGNOSIS — R072 Precordial pain: Secondary | ICD-10-CM | POA: Insufficient documentation

## 2020-02-03 NOTE — Progress Notes (Signed)
Cardiology Office Note   Date:  02/04/2020   ID:  Shane [name], DOB 04/14/1946, MRN [id number]  PCP:  [name], MD  Cardiologist:   Minus Breeding, MD   Chief Complaint  Patient presents with  . Edema      History of Present Illness: Shane [name] is a 74 y.o. male who presents for follow up of palpitations.  The patient reports that he was told he had a heart attack years ago.  He was seen at the [place] and he said he had some heart damage but he does not report having a catheterization.  He did have a POET (Plain Old Exercise Treadmill) in 2016.  There was no ischemia on this but it was submax as he had SOB and could not walk the treadmill.  At my first appt with him he was noticing skipped beats.    I ordered a Holter.  He had PVCs and PACs.  There was a question of short runs of CHB.   He had a follow up echo with mild septal hypertrophy.  He had a normal EF.   Since I last saw him he was in the hospital with hypertensive urgency.  He was treated with IV NTG.  He had atypical chest pain.   He had chest pain during that admission and was to have an out patient work up.  He had AKI which improved.     He did not know this appointment today but we called him and invited him to come late.  It does not seem like he is very aware of medical situations.  He says he forgets to take his medicines.  He has been more compliant it sounds like and he has been more compliant with salt restriction.  He does not have any new acute complaints.  He does get episodically short of breath which he ascribes to his asthma.  He takes his bronchodilator and his breathing will get better.Marland Kitchen  He does not have any cough fevers or chills.  He is not describing PND or orthopnea.  He is not having any chest pressure, neck or arm discomfort.  He is not having any weight gain.  He has chronic lower extremity edema.   Past Medical History:  Diagnosis Date  . Acute asthmatic bronchitis   . Anemia    . Anxiety disorder   . Borderline diabetes mellitus   . Borderline hypertension   . Degenerative joint disease   . Depression   . GERD (gastroesophageal reflux disease)   . History of adenomatous polyp of colon   . History of sinusitis   . Posttraumatic stress disorder   . Sleep apnea    Uses CPAP    Past Surgical History:  Procedure Laterality Date  . COLONOSCOPY    . FINGER SURGERY Right    index  . NASAL SINUS SURGERY     x 2  . POLYPECTOMY       Current Outpatient Medications  Medication Sig Dispense Refill  . albuterol (PROAIR HFA) 108 (90 Base) MCG/ACT inhaler Can inhale two puffs every four to six hours as needed for cough or wheeze. 18 g 1  . alum & mag hydroxide-simeth (MYLANTA MAXIMUM STRENGTH) 400-400-40 MG/5ML suspension Take 5 mLs by mouth every 6 (six) hours as needed (abdominal pain or indigestion). 355 mL 0  . amLODipine (NORVASC) 10 MG tablet Take 1 tablet (10 mg total) by mouth daily. 30 tablet 0  . BREO  ELLIPTA 200-25 MCG/INH AEPB TAKE 1 PUFF BY MOUTH EVERY DAY (Patient taking differently: Inhale 1 puff into the lungs daily. ) 60 each 3  . montelukast (SINGULAIR) 10 MG tablet TAKE 1 TABLET BY MOUTH EVERYDAY AT BEDTIME (Patient taking differently: Take 10 mg by mouth at bedtime. ) 30 tablet 0  . Propylene Glycol (SYSTANE BALANCE OP) Apply 1 drop to eye daily as needed (dry eyes). Reported on 12/19/2015    . rivaroxaban (XARELTO) 20 MG TABS tablet Take 1 tablet (20 mg total) by mouth daily with supper. 30 tablet 0  . triamcinolone (NASACORT ALLERGY 24HR) 55 MCG/ACT AERO nasal inhaler Use one spray in each nostril once daily as directed. (Patient taking differently: Place 1 spray into the nose daily. ) 1 Inhaler 5   No current facility-administered medications for this visit.    Allergies:   Patient has no known allergies.    ROS:  Please see the history of present illness.   Otherwise, review of systems are positive for none.   All other systems are reviewed  and negative.    PHYSICAL EXAM: VS:  BP 124/70 (BP Location: Left Arm, Patient Position: Sitting, Cuff Size: Large)   Pulse 61   Ht 6\' 2"  (1.88 m)   Wt 292 lb 9.6 oz (132.7 kg)   BMI 37.57 kg/m  , BMI Body mass index is 37.57 kg/m. GENERAL:  Well appearing NECK:  No jugular venous distention, waveform within normal limits, carotid upstroke brisk and symmetric, no bruits, no thyromegaly LUNGS:  Clear to auscultation bilaterally CHEST:  Unremarkable HEART:  PMI not displaced or sustained,S1 and S2 within normal limits, no S3, no S4, no clicks, no rubs, no murmurs ABD:  Flat, positive bowel sounds normal in frequency in pitch, no bruits, no rebound, no guarding, no midline pulsatile mass, no hepatomegaly, no splenomegaly EXT:  2 plus pulses throughout, moderate edema, no cyanosis no clubbing    EKG:  EKG is not ordered today. The ekg ordered today demonstrates NA   Recent Labs: 12/25/2019: ALT 14; B Natriuretic Peptide 86.0 12/27/2019: BUN 18; Creatinine, Ser 1.21; Hemoglobin 11.6; Magnesium 2.1; Platelets 158; Potassium 3.6; Sodium 137    Lipid Panel    Component Value Date/Time   CHOL 144 03/01/2019 1540   TRIG 65.0 03/01/2019 1540   HDL 51.50 03/01/2019 1540   CHOLHDL 3 03/01/2019 1540   VLDL 13.0 03/01/2019 1540   LDLCALC 80 03/01/2019 1540      Wt Readings from Last 3 Encounters:  02/04/20 292 lb 9.6 oz (132.7 kg)  12/26/19 263 lb 7.2 oz (119.5 kg)  05/30/19 262 lb (118.8 kg)      Other studies Reviewed: Additional studies/ records that were reviewed today include: Hospital records. Review of the above records demonstrates:  Please see elsewhere in the note.     ASSESSMENT AND PLAN:   CHEST PAIN:    The patient's had no chest discomfort.  I did review the hospital records I think the pretest probability of obstructive coronary disease for his vague symptoms is low.  He has had a previous negative treadmill test and he had negative high-sensitivity troponins  x2 in the hospital.  I do not think further ischemia work-up is indicated.  He needs continued risk modification.    HYPERTENSION: We gave him a blood pressure cuff and instructions on how to use this and he will keep a blood pressure diary.  He will continue the meds as listed.  DVT: He is now  taking his anticoagulation.  We gave him a pillbox and instructions on how to use this so that he hopefully is more compliant with his medications.  I would probably continue this through December.  We will see him back then to consider stopping the anticoagulant.  LEG SWELLING: He has some chronic leg swelling.  He asked about diuretic but I prefer to avoid this as he has confusion with his medications and has had some renal insufficiency.  We talked instead about conservative management of his lower extremity swelling.  Current medicines are reviewed at length with the patient today.  The patient does not have concerns regarding medicines.  The following changes have been made:  no change  Labs/ tests ordered today include: None No orders of the defined types were placed in this encounter.    Disposition:   FU with me in December.     Signed, Minus Breeding, MD  02/04/2020 5:08 PM    [place]

## 2020-02-04 ENCOUNTER — Other Ambulatory Visit: Payer: Self-pay

## 2020-02-04 ENCOUNTER — Ambulatory Visit: Payer: PPO | Admitting: Cardiology

## 2020-02-04 ENCOUNTER — Encounter: Payer: Self-pay | Admitting: Cardiology

## 2020-02-04 VITALS — BP 124/70 | HR 61 | Ht 74.0 in | Wt 292.6 lb

## 2020-02-04 DIAGNOSIS — R072 Precordial pain: Secondary | ICD-10-CM

## 2020-02-04 DIAGNOSIS — Z7189 Other specified counseling: Secondary | ICD-10-CM

## 2020-02-04 DIAGNOSIS — I10 Essential (primary) hypertension: Secondary | ICD-10-CM | POA: Diagnosis not present

## 2020-02-04 NOTE — Patient Instructions (Signed)
Medication Instructions:  Your physician recommends that you continue on your current medications as directed. Please refer to the Current Medication list given to you today.  *If you need a refill on your cardiac medications before your next appointment, please call your pharmacy*    Follow-Up: At [facility], you and your health needs are our priority.  As part of our continuing mission to provide you with exceptional heart care, we have created designated Provider Care Teams.  These Care Teams include your primary Cardiologist (physician) and Advanced Practice Providers (APPs -  Physician Assistants and Nurse Practitioners) who all work together to provide you with the care you need, when you need it.  We recommend signing up for the patient portal called "MyChart".  Sign up information is provided on this After Visit Summary.  MyChart is used to connect with patients for Virtual Visits (Telemedicine).  Patients are able to view lab/test results, encounter notes, upcoming appointments, etc.  Non-urgent messages can be sent to your provider as well.   To learn more about what you can do with MyChart, go to [email or web address].    Your next appointment:    Tuesday, 06/06/20 at 1:40 PM   The format for your next appointment:   In Person  Provider:   Minus Breeding, MD

## 2020-02-08 ENCOUNTER — Ambulatory Visit: Payer: PPO | Admitting: Allergy and Immunology

## 2020-03-10 ENCOUNTER — Other Ambulatory Visit: Payer: Self-pay | Admitting: Allergy and Immunology

## 2020-03-16 ENCOUNTER — Other Ambulatory Visit: Payer: Self-pay | Admitting: Allergy and Immunology

## 2020-03-31 DIAGNOSIS — Z1159 Encounter for screening for other viral diseases: Secondary | ICD-10-CM | POA: Diagnosis not present

## 2020-03-31 DIAGNOSIS — Z6839 Body mass index (BMI) 39.0-39.9, adult: Secondary | ICD-10-CM | POA: Diagnosis not present

## 2020-03-31 DIAGNOSIS — Z79899 Other long term (current) drug therapy: Secondary | ICD-10-CM | POA: Diagnosis not present

## 2020-03-31 DIAGNOSIS — I10 Essential (primary) hypertension: Secondary | ICD-10-CM | POA: Diagnosis not present

## 2020-04-03 ENCOUNTER — Other Ambulatory Visit: Payer: Self-pay | Admitting: Allergy and Immunology

## 2020-04-10 ENCOUNTER — Ambulatory Visit: Payer: PPO | Attending: Internal Medicine

## 2020-04-10 DIAGNOSIS — Z23 Encounter for immunization: Secondary | ICD-10-CM

## 2020-04-10 NOTE — Progress Notes (Signed)
° °  Covid-19 Vaccination Clinic  Name:  Shane [name]    MRN: [id number] DOB: [birth year]  04/10/2020  [name] was observed post Covid-19 immunization for 15 minutes without incident. He was provided with Vaccine Information Sheet and instruction to access the V-Safe system.   [name] was instructed to call 911 with any severe reactions post vaccine:  Difficulty breathing   Swelling of face and throat   A fast heartbeat   A bad rash all over body   Dizziness and weakness

## 2020-04-27 ENCOUNTER — Other Ambulatory Visit: Payer: Self-pay | Admitting: Allergy and Immunology

## 2020-06-04 DIAGNOSIS — M7989 Other specified soft tissue disorders: Secondary | ICD-10-CM | POA: Insufficient documentation

## 2020-06-04 NOTE — Progress Notes (Signed)
Cardiology Office Note   Date:  06/06/2020   ID:  Shane [name], DOB December 27, 1945, MRN [id number]  PCP:  [name], MD  Cardiologist:   Minus Breeding, MD   Chief Complaint  Patient presents with  . Leg Swelling      History of Present Illness: Shane [name] is a 74 y.o. male who presents for follow up of palpitations.  The patient reports that he was told he had a heart attack years ago.  He was seen at the [place] and he said he had some heart damage but he does not report having a catheterization.  He did have a POET (Plain Old Exercise Treadmill) in 2016.  There was no ischemia on this but it was submax as he had SOB and could not walk the treadmill.  At my first appt with him he was noticing skipped beats.    I ordered a Holter.  He had PVCs and PACs.  There was a question of short runs of CHB.   He had a follow up echo with mild septal hypertrophy.  He had a normal EF.   Since I last saw him he was in the hospital with hypertensive urgency.  He was treated with IV NTG.  He had atypical chest pain.   He had chest pain during that admission and was to have an out patient work up.  He had AKI which improved.     Since I last saw him he has had no new acute complaints.  He does look like he has gained about 30 pounds.  As per usual he showed up late to his appointment but he did pounds.  He talks very slowly but it is clear that he remembers a lot of our previous conversations.  He does eat out almost all the time.  He tries to tell the people at the restaurants that he needs less salt.  However, he has been eating a lot more.  He lives alone.  I am not sure of his level of social support.  He is not describing any chest pressure, neck or arm discomfort.  He does occasionally have some palpitations but he is not particularly bad.  He said he had a reaction to his medication a while ago when his scalp was burning and he went to an urgent care but I cannot find these  records.  Past Medical History:  Diagnosis Date  . Acute asthmatic bronchitis   . Anemia   . Anxiety disorder   . Borderline diabetes mellitus   . Borderline hypertension   . Degenerative joint disease   . Depression   . GERD (gastroesophageal reflux disease)   . History of adenomatous polyp of colon   . History of sinusitis   . Posttraumatic stress disorder   . Sleep apnea    Uses CPAP    Past Surgical History:  Procedure Laterality Date  . COLONOSCOPY    . FINGER SURGERY Right    index  . NASAL SINUS SURGERY     x 2  . POLYPECTOMY       Current Outpatient Medications  Medication Sig Dispense Refill  . albuterol (VENTOLIN HFA) 108 (90 Base) MCG/ACT inhaler INHALE TWO PUFFS EVERY FOUR TO SIX HOURS AS NEEDED FOR COUGH OR WHEEZE. 8.5 each 0  . alum & mag hydroxide-simeth (MYLANTA MAXIMUM STRENGTH) 400-400-40 MG/5ML suspension Take 5 mLs by mouth every 6 (six) hours as needed (abdominal pain or indigestion). 355 mL  0  . amLODipine (NORVASC) 10 MG tablet Take 1 tablet (10 mg total) by mouth daily. 30 tablet 0  . BREO ELLIPTA 200-25 MCG/INH AEPB INHALE 1 PUFF BY MOUTH EVERY DAY 60 each 2  . montelukast (SINGULAIR) 10 MG tablet TAKE 1 TABLET BY MOUTH EVERYDAY AT BEDTIME (Patient taking differently: Take 10 mg by mouth at bedtime. ) 30 tablet 0  . Propylene Glycol (SYSTANE BALANCE OP) Apply 1 drop to eye daily as needed (dry eyes). Reported on 12/19/2015    . triamcinolone (NASACORT ALLERGY 24HR) 55 MCG/ACT AERO nasal inhaler Use one spray in each nostril once daily as directed. (Patient taking differently: Place 1 spray into the nose daily. ) 1 Inhaler 5  . furosemide (LASIX) 40 MG tablet Take 1 tablet (40 mg total) by mouth daily. TAKE 1 TABLET DAILY FOR 4 DAYS. 30 tablet 0   No current facility-administered medications for this visit.    Allergies:   Patient has no known allergies.    ROS:  Please see the history of present illness.   Otherwise, review of systems are  positive for left.   All other systems are reviewed and negative.    PHYSICAL EXAM: VS:  BP (!) 148/90   Pulse 63   Ht 6\' 2"  (1.88 m)   Wt (!) 302 lb 12.8 oz (137.3 kg)   SpO2 94%   BMI 38.88 kg/m  , BMI Body mass index is 38.88 kg/m. GENERAL:  Well appearing NECK: +10 cm jugular venous distention, waveform within normal limits, carotid upstroke brisk and symmetric, no bruits, no thyromegaly LUNGS:  Clear to auscultation bilaterally CHEST:  Unremarkable HEART:  PMI not displaced or sustained,S1 and S2 within normal limits, no S3, no S4, no clicks, no rubs, no murmurs ABD:  Flat, positive bowel sounds normal in frequency in pitch, no bruits, no rebound, no guarding, no midline pulsatile mass, no hepatomegaly, no splenomegaly EXT:  2 plus pulses throughout, severe bilateral leg to the knees edema, no cyanosis no clubbing    EKG:  EKG is not ordered today. NA   Recent Labs: 12/25/2019: ALT 14; B Natriuretic Peptide 86.0 12/27/2019: BUN 18; Creatinine, Ser 1.21; Hemoglobin 11.6; Magnesium 2.1; Platelets 158; Potassium 3.6; Sodium 137    Lipid Panel    Component Value Date/Time   CHOL 144 03/01/2019 1540   TRIG 65.0 03/01/2019 1540   HDL 51.50 03/01/2019 1540   CHOLHDL 3 03/01/2019 1540   VLDL 13.0 03/01/2019 1540   LDLCALC 80 03/01/2019 1540      Wt Readings from Last 3 Encounters:  06/06/20 (!) 302 lb 12.8 oz (137.3 kg)  02/04/20 292 lb 9.6 oz (132.7 kg)  12/26/19 263 lb 7.2 oz (119.5 kg)      Other studies Reviewed: Additional studies/ records that were reviewed today include: Labs Review of the above records demonstrates:  Please see elsewhere in the note.     ASSESSMENT AND PLAN:   CHEST PAIN:     He is no longer describing chest discomfort.  No further ischemia work-up is planned.  HYPERTENSION:   His blood pressure is mildly elevated.  However, he has had previous confusion with his medications.  Of asked him to keep a blood pressure diary.   DVT:   At  the last appointment we had a very long discussion with the patient we gave him a pillbox.  He was supposed to be taking Xarelto and it seemed like he was at that time.  However, today  he is not taking his Xarelto.  I think the most prudent thing is to check lower extremity venous Doppler.   If there is no evidence of DVT then no further management is necessary.  LEG SWELLING:   Needs all of his meals out.  He is getting a lot of salt.  He does not seem to have left-sided heart failure but he clearly has increased lower extremity swelling.  I am going to give him 4 days of Lasix.  We ordered for him a Lounge Doctor pillow to keep his legs elevated.  We will bring him back in 1 week for a basic metabolic profile.   Current medicines are reviewed at length with the patient today.  The patient does not have concerns regarding medicines.  The following changes have been made:  None  Labs/ tests ordered today include:   Orders Placed This Encounter  Procedures  . Basic metabolic panel     Disposition:   FU with APP in one month.     Signed, Minus Breeding, MD  06/06/2020 5:11 PM    [facility]

## 2020-06-06 ENCOUNTER — Telehealth: Payer: Self-pay | Admitting: Licensed Clinical Social Worker

## 2020-06-06 ENCOUNTER — Encounter: Payer: Self-pay | Admitting: Cardiology

## 2020-06-06 ENCOUNTER — Other Ambulatory Visit: Payer: Self-pay

## 2020-06-06 ENCOUNTER — Ambulatory Visit: Payer: PPO | Admitting: Cardiology

## 2020-06-06 VITALS — BP 148/90 | HR 63 | Ht 74.0 in | Wt 302.8 lb

## 2020-06-06 DIAGNOSIS — I82411 Acute embolism and thrombosis of right femoral vein: Secondary | ICD-10-CM | POA: Diagnosis not present

## 2020-06-06 DIAGNOSIS — I10 Essential (primary) hypertension: Secondary | ICD-10-CM

## 2020-06-06 DIAGNOSIS — Z79899 Other long term (current) drug therapy: Secondary | ICD-10-CM

## 2020-06-06 DIAGNOSIS — R079 Chest pain, unspecified: Secondary | ICD-10-CM

## 2020-06-06 DIAGNOSIS — M7989 Other specified soft tissue disorders: Secondary | ICD-10-CM

## 2020-06-06 MED ORDER — FUROSEMIDE 40 MG PO TABS
40.0000 mg | ORAL_TABLET | Freq: Every day | ORAL | 0 refills | Status: DC
Start: 2020-06-06 — End: 2020-07-05

## 2020-06-06 NOTE — Telephone Encounter (Signed)
CSW received request from MD to assist patient with a lounge doctor pillow. CSW contacted patient to confirm address and left message for return call. Raquel Sarna, [place], [place]

## 2020-06-06 NOTE — Patient Instructions (Addendum)
Medication Instructions:  START LASIX (FUROSEMIDE) 40MG  DAILY FOR 4 DAYS *If you need a refill on your cardiac medications before your next appointment, please call your pharmacy*  Lab Work: COME IN NEXT WEEK FOR BLOOD WORK AND A NURSE VISIT TO CHECK YOUR WEIGHT If you have labs (blood work) drawn today and your tests are completely normal, you will receive your results only by: Marland Kitchen MyChart Message (if you have MyChart) OR . A paper copy in the mail If you have any lab test that is abnormal or we need to change your treatment, we will call you to review the results.  Testing/Procedures: Your physician has requested that you have a lower extremity venous duplex. This test is an ultrasound of the veins in the legs or arms. It looks at venous blood flow that carries blood from the heart to the legs or arms. Allow one hour for a Lower Venous exam. Allow thirty minutes for an Upper Venous exam. There are no restrictions or special instructions.   Follow-Up: At [facility], you and your health needs are our priority.  As part of our continuing mission to provide you with exceptional heart care, we have created designated Provider Care Teams.  These Care Teams include your primary Cardiologist (physician) and Advanced Practice Providers (APPs -  Physician Assistants and Nurse Practitioners) who all work together to provide you with the care you need, when you need it.   Your next appointment:   1 month(s)  The format for your next appointment:   In Person  Provider:   ANY AVAILABLE APP

## 2020-06-07 ENCOUNTER — Telehealth: Payer: Self-pay | Admitting: Licensed Clinical Social Worker

## 2020-06-07 NOTE — Telephone Encounter (Signed)
CSW contacted patient and made arrangement to order leg pillow requested by MD. CSW ordered and will contact patient when arrives. Patient unfortunately stated that unable to have pillow delivered to his home due to safety concerns. CSW will contact patient when it arrives and set up delivery to patient. Patient grateful for the support and assistance. Raquel Sarna, [place], [place]

## 2020-06-12 ENCOUNTER — Telehealth: Payer: Self-pay | Admitting: Licensed Clinical Social Worker

## 2020-06-12 NOTE — Telephone Encounter (Signed)
CSW received patient's lounge doctor via [place] and contacted patient to inform arrival. Patient unable to pick up due to lack of transportation therefor CSW will drop off at home. Patient grateful for the support and assistance. Raquel Sarna, [place], [place]

## 2020-06-12 NOTE — Telephone Encounter (Signed)
Entered in error

## 2020-06-18 ENCOUNTER — Observation Stay (HOSPITAL_COMMUNITY): Payer: PPO

## 2020-06-18 ENCOUNTER — Inpatient Hospital Stay (HOSPITAL_COMMUNITY)
Admission: EM | Admit: 2020-06-18 | Discharge: 2020-06-21 | DRG: 305 | Disposition: A | Discharge status: Home / Self Care | Payer: PPO | Attending: Family Medicine | Admitting: Family Medicine

## 2020-06-18 ENCOUNTER — Emergency Department (HOSPITAL_COMMUNITY): Payer: PPO

## 2020-06-18 ENCOUNTER — Encounter (HOSPITAL_COMMUNITY): Payer: Self-pay | Admitting: Internal Medicine

## 2020-06-18 DIAGNOSIS — R079 Chest pain, unspecified: Secondary | ICD-10-CM | POA: Diagnosis not present

## 2020-06-18 DIAGNOSIS — Z833 Family history of diabetes mellitus: Secondary | ICD-10-CM | POA: Diagnosis not present

## 2020-06-18 DIAGNOSIS — I959 Hypotension, unspecified: Secondary | ICD-10-CM | POA: Diagnosis present

## 2020-06-18 DIAGNOSIS — M7989 Other specified soft tissue disorders: Secondary | ICD-10-CM | POA: Diagnosis present

## 2020-06-18 DIAGNOSIS — K449 Diaphragmatic hernia without obstruction or gangrene: Secondary | ICD-10-CM | POA: Diagnosis present

## 2020-06-18 DIAGNOSIS — I129 Hypertensive chronic kidney disease with stage 1 through stage 4 chronic kidney disease, or unspecified chronic kidney disease: Secondary | ICD-10-CM | POA: Diagnosis not present

## 2020-06-18 DIAGNOSIS — M47814 Spondylosis without myelopathy or radiculopathy, thoracic region: Secondary | ICD-10-CM | POA: Diagnosis not present

## 2020-06-18 DIAGNOSIS — Z87891 Personal history of nicotine dependence: Secondary | ICD-10-CM

## 2020-06-18 DIAGNOSIS — Z8261 Family history of arthritis: Secondary | ICD-10-CM | POA: Diagnosis not present

## 2020-06-18 DIAGNOSIS — M25512 Pain in left shoulder: Secondary | ICD-10-CM | POA: Diagnosis not present

## 2020-06-18 DIAGNOSIS — G4733 Obstructive sleep apnea (adult) (pediatric): Secondary | ICD-10-CM | POA: Diagnosis not present

## 2020-06-18 DIAGNOSIS — I2699 Other pulmonary embolism without acute cor pulmonale: Secondary | ICD-10-CM | POA: Diagnosis not present

## 2020-06-18 DIAGNOSIS — Z86010 Personal history of colonic polyps: Secondary | ICD-10-CM | POA: Diagnosis not present

## 2020-06-18 DIAGNOSIS — N1831 Chronic kidney disease, stage 3a: Secondary | ICD-10-CM | POA: Diagnosis present

## 2020-06-18 DIAGNOSIS — Z79899 Other long term (current) drug therapy: Secondary | ICD-10-CM

## 2020-06-18 DIAGNOSIS — I440 Atrioventricular block, first degree: Secondary | ICD-10-CM | POA: Diagnosis not present

## 2020-06-18 DIAGNOSIS — J9811 Atelectasis: Secondary | ICD-10-CM | POA: Diagnosis present

## 2020-06-18 DIAGNOSIS — K219 Gastro-esophageal reflux disease without esophagitis: Secondary | ICD-10-CM | POA: Diagnosis present

## 2020-06-18 DIAGNOSIS — R609 Edema, unspecified: Secondary | ICD-10-CM | POA: Diagnosis not present

## 2020-06-18 DIAGNOSIS — I82441 Acute embolism and thrombosis of right tibial vein: Secondary | ICD-10-CM | POA: Diagnosis not present

## 2020-06-18 DIAGNOSIS — Z20822 Contact with and (suspected) exposure to covid-19: Secondary | ICD-10-CM | POA: Diagnosis not present

## 2020-06-18 DIAGNOSIS — Z66 Do not resuscitate: Secondary | ICD-10-CM | POA: Diagnosis present

## 2020-06-18 DIAGNOSIS — I160 Hypertensive urgency: Secondary | ICD-10-CM | POA: Diagnosis not present

## 2020-06-18 DIAGNOSIS — Z7951 Long term (current) use of inhaled steroids: Secondary | ICD-10-CM

## 2020-06-18 DIAGNOSIS — I169 Hypertensive crisis, unspecified: Secondary | ICD-10-CM | POA: Diagnosis present

## 2020-06-18 DIAGNOSIS — F419 Anxiety disorder, unspecified: Secondary | ICD-10-CM | POA: Diagnosis present

## 2020-06-18 DIAGNOSIS — R4189 Other symptoms and signs involving cognitive functions and awareness: Secondary | ICD-10-CM | POA: Diagnosis present

## 2020-06-18 DIAGNOSIS — F32A Depression, unspecified: Secondary | ICD-10-CM | POA: Diagnosis present

## 2020-06-18 DIAGNOSIS — E6609 Other obesity due to excess calories: Secondary | ICD-10-CM

## 2020-06-18 DIAGNOSIS — I491 Atrial premature depolarization: Secondary | ICD-10-CM | POA: Diagnosis not present

## 2020-06-18 DIAGNOSIS — Z6838 Body mass index (BMI) 38.0-38.9, adult: Secondary | ICD-10-CM | POA: Diagnosis not present

## 2020-06-18 DIAGNOSIS — Z86718 Personal history of other venous thrombosis and embolism: Secondary | ICD-10-CM

## 2020-06-18 DIAGNOSIS — D696 Thrombocytopenia, unspecified: Secondary | ICD-10-CM | POA: Diagnosis not present

## 2020-06-18 DIAGNOSIS — E119 Type 2 diabetes mellitus without complications: Secondary | ICD-10-CM

## 2020-06-18 DIAGNOSIS — R460 Very low level of personal hygiene: Secondary | ICD-10-CM | POA: Diagnosis present

## 2020-06-18 DIAGNOSIS — J4540 Moderate persistent asthma, uncomplicated: Secondary | ICD-10-CM | POA: Diagnosis not present

## 2020-06-18 DIAGNOSIS — I499 Cardiac arrhythmia, unspecified: Secondary | ICD-10-CM | POA: Diagnosis not present

## 2020-06-18 DIAGNOSIS — E1122 Type 2 diabetes mellitus with diabetic chronic kidney disease: Secondary | ICD-10-CM | POA: Diagnosis present

## 2020-06-18 DIAGNOSIS — F4310 Post-traumatic stress disorder, unspecified: Secondary | ICD-10-CM | POA: Diagnosis present

## 2020-06-18 DIAGNOSIS — M549 Dorsalgia, unspecified: Secondary | ICD-10-CM | POA: Diagnosis present

## 2020-06-18 DIAGNOSIS — I872 Venous insufficiency (chronic) (peripheral): Secondary | ICD-10-CM | POA: Diagnosis present

## 2020-06-18 DIAGNOSIS — L280 Lichen simplex chronicus: Secondary | ICD-10-CM | POA: Diagnosis present

## 2020-06-18 DIAGNOSIS — R52 Pain, unspecified: Secondary | ICD-10-CM | POA: Diagnosis not present

## 2020-06-18 LAB — CBC
HCT: 44.5 % (ref 39.0–52.0)
Hemoglobin: 13.5 g/dL (ref 13.0–17.0)
MCH: 19.6 pg — ABNORMAL LOW (ref 26.0–34.0)
MCHC: 30.3 g/dL (ref 30.0–36.0)
MCV: 64.7 fL — ABNORMAL LOW (ref 80.0–100.0)
Platelets: 78 10*3/uL — ABNORMAL LOW (ref 150–400)
RBC: 6.88 MIL/uL — ABNORMAL HIGH (ref 4.22–5.81)
RDW: 18.8 % — ABNORMAL HIGH (ref 11.5–15.5)
WBC: 6.2 10*3/uL (ref 4.0–10.5)
nRBC: 0 % (ref 0.0–0.2)

## 2020-06-18 LAB — BASIC METABOLIC PANEL
Anion gap: 15 (ref 5–15)
BUN: 13 mg/dL (ref 8–23)
CO2: 25 mmol/L (ref 22–32)
Calcium: 9.6 mg/dL (ref 8.9–10.3)
Chloride: 98 mmol/L (ref 98–111)
Creatinine, Ser: 1.31 mg/dL — ABNORMAL HIGH (ref 0.61–1.24)
GFR, Estimated: 57 mL/min — ABNORMAL LOW (ref >60)
Glucose, Bld: 116 mg/dL — ABNORMAL HIGH (ref 70–99)
Potassium: 4 mmol/L (ref 3.5–5.1)
Sodium: 138 mmol/L (ref 135–145)

## 2020-06-18 LAB — CBG MONITORING, ED: Glucose-Capillary: 124 mg/dL — ABNORMAL HIGH (ref 70–99)

## 2020-06-18 LAB — GLUCOSE, CAPILLARY
Glucose-Capillary: 122 mg/dL — ABNORMAL HIGH (ref 70–99)
Glucose-Capillary: 111 mg/dL — ABNORMAL HIGH (ref 70–99)

## 2020-06-18 LAB — TSH: TSH: 3.233 u[IU]/mL (ref 0.350–4.500)

## 2020-06-18 LAB — TROPONIN I (HIGH SENSITIVITY)
Troponin I (High Sensitivity): 17 ng/L (ref <18)
Troponin I (High Sensitivity): 20 ng/L — ABNORMAL HIGH (ref <18)

## 2020-06-18 LAB — RESP PANEL BY RT-PCR (FLU A&B, COVID) ARPGX2
Influenza A by PCR: NEGATIVE
Influenza B by PCR: NEGATIVE
SARS Coronavirus 2 by RT PCR: NEGATIVE

## 2020-06-18 LAB — HEMOGLOBIN A1C
Hgb A1c MFr Bld: 5.8 % — ABNORMAL HIGH (ref 4.8–5.6)
Mean Plasma Glucose: 119.76 mg/dL

## 2020-06-18 LAB — BRAIN NATRIURETIC PEPTIDE: B Natriuretic Peptide: 68.1 pg/mL (ref 0.0–100.0)

## 2020-06-18 MED ORDER — PROPYLENE GLYCOL 0.6 % OP SOLN: Freq: Every day | OPHTHALMIC | Status: DC | PRN

## 2020-06-18 MED ORDER — FLUTICASONE FUROATE-VILANTEROL 200-25 MCG/INH IN AEPB
1.0000 | INHALATION_SPRAY | Freq: Every day | RESPIRATORY_TRACT | Status: DC
  Administered 2020-06-18 – 2020-06-21 (×4): 1 via RESPIRATORY_TRACT
  Filled 2020-06-18: qty 28

## 2020-06-18 MED ORDER — DOCUSATE SODIUM 100 MG PO CAPS
100.0000 mg | ORAL_CAPSULE | Freq: Two times a day (BID) | ORAL | Status: DC
  Administered 2020-06-18 – 2020-06-20 (×2): 100 mg via ORAL
  Filled 2020-06-18 (×6): qty 1

## 2020-06-18 MED ORDER — MONTELUKAST SODIUM 10 MG PO TABS
10.0000 mg | ORAL_TABLET | Freq: Every day | ORAL | Status: DC
  Administered 2020-06-18 – 2020-06-21 (×3): 10 mg via ORAL
  Filled 2020-06-18 (×4): qty 1

## 2020-06-18 MED ORDER — POLYVINYL ALCOHOL 1.4 % OP SOLN
1.0000 [drp] | OPHTHALMIC | Status: DC | PRN
  Filled 2020-06-18: qty 15

## 2020-06-18 MED ORDER — HYDROMORPHONE HCL 1 MG/ML IJ SOLN
0.5000 mg | Freq: Once | INTRAMUSCULAR | Status: AC
  Administered 2020-06-18: 0.5 mg via INTRAVENOUS
  Filled 2020-06-18: qty 1

## 2020-06-18 MED ORDER — TRIAMCINOLONE ACETONIDE 55 MCG/ACT NA AERO
1.0000 | INHALATION_SPRAY | Freq: Every day | NASAL | Status: DC
  Administered 2020-06-18 – 2020-06-20 (×3): 1 via NASAL
  Filled 2020-06-18: qty 10.8

## 2020-06-18 MED ORDER — ACETAMINOPHEN 325 MG PO TABS: 650.0000 mg | ORAL_TABLET | Freq: Four times a day (QID) | ORAL | Status: DC | PRN

## 2020-06-18 MED ORDER — HYDROCODONE-ACETAMINOPHEN 5-325 MG PO TABS
1.0000 | ORAL_TABLET | ORAL | Status: DC | PRN
  Administered 2020-06-18 – 2020-06-19 (×2): 2 via ORAL
  Filled 2020-06-18 (×2): qty 2

## 2020-06-18 MED ORDER — INSULIN ASPART 100 UNIT/ML ~~LOC~~ SOLN
0.0000 [IU] | Freq: Three times a day (TID) | SUBCUTANEOUS | Status: DC
  Administered 2020-06-18 – 2020-06-21 (×3): 2 [IU] via SUBCUTANEOUS

## 2020-06-18 MED ORDER — HYDRALAZINE HCL 20 MG/ML IJ SOLN
10.0000 mg | Freq: Once | INTRAMUSCULAR | Status: AC
  Administered 2020-06-18: 10 mg via INTRAVENOUS
  Filled 2020-06-18: qty 1

## 2020-06-18 MED ORDER — AMLODIPINE BESYLATE 10 MG PO TABS
10.0000 mg | ORAL_TABLET | Freq: Every day | ORAL | Status: DC
  Administered 2020-06-18 – 2020-06-19 (×2): 10 mg via ORAL
  Filled 2020-06-18: qty 1
  Filled 2020-06-18: qty 2

## 2020-06-18 MED ORDER — ASPIRIN 81 MG PO CHEW: 324.0000 mg | CHEWABLE_TABLET | Freq: Once | ORAL | Status: DC

## 2020-06-18 MED ORDER — POLYETHYLENE GLYCOL 3350 17 G PO PACK: 17.0000 g | PACK | Freq: Every day | ORAL | Status: DC | PRN

## 2020-06-18 MED ORDER — SODIUM CHLORIDE 0.9% FLUSH
3.0000 mL | Freq: Two times a day (BID) | INTRAVENOUS | Status: DC
  Administered 2020-06-19 – 2020-06-21 (×4): 3 mL via INTRAVENOUS

## 2020-06-18 MED ORDER — BISACODYL 5 MG PO TBEC: 5.0000 mg | DELAYED_RELEASE_TABLET | Freq: Every day | ORAL | Status: DC | PRN

## 2020-06-18 MED ORDER — ONDANSETRON HCL 4 MG PO TABS: 4.0000 mg | ORAL_TABLET | Freq: Four times a day (QID) | ORAL | Status: DC | PRN

## 2020-06-18 MED ORDER — ENOXAPARIN SODIUM 40 MG/0.4ML ~~LOC~~ SOLN
40.0000 mg | SUBCUTANEOUS | Status: DC
  Administered 2020-06-18 – 2020-06-20 (×3): 40 mg via SUBCUTANEOUS
  Filled 2020-06-18 (×3): qty 0.4

## 2020-06-18 MED ORDER — ALBUTEROL SULFATE (2.5 MG/3ML) 0.083% IN NEBU
2.5000 mg | INHALATION_SOLUTION | RESPIRATORY_TRACT | Status: DC | PRN
  Administered 2020-06-19: 18:00:00 2.5 mg via RESPIRATORY_TRACT
  Filled 2020-06-18: qty 3

## 2020-06-18 MED ORDER — METHOCARBAMOL 1000 MG/10ML IJ SOLN
500.0000 mg | Freq: Four times a day (QID) | INTRAVENOUS | Status: DC | PRN
  Filled 2020-06-18 (×4): qty 5

## 2020-06-18 MED ORDER — IOHEXOL 350 MG/ML SOLN
75.0000 mL | Freq: Once | INTRAVENOUS | Status: AC | PRN
  Administered 2020-06-18: 75 mL via INTRAVENOUS

## 2020-06-18 MED ORDER — MORPHINE SULFATE (PF) 2 MG/ML IV SOLN: 2.0000 mg | INTRAVENOUS | Status: DC | PRN

## 2020-06-18 MED ORDER — ACETAMINOPHEN 650 MG RE SUPP: 650.0000 mg | Freq: Four times a day (QID) | RECTAL | Status: DC | PRN

## 2020-06-18 MED ORDER — HYDRALAZINE HCL 20 MG/ML IJ SOLN: 5.0000 mg | INTRAMUSCULAR | Status: DC | PRN

## 2020-06-18 MED ORDER — ONDANSETRON HCL 4 MG/2ML IJ SOLN: 4.0000 mg | Freq: Four times a day (QID) | INTRAMUSCULAR | Status: DC | PRN

## 2020-06-18 MED ORDER — HYDROCERIN EX CREA
1.0000 "application " | TOPICAL_CREAM | CUTANEOUS | Status: DC
  Administered 2020-06-20: 1 via TOPICAL
  Filled 2020-06-18: qty 113

## 2020-06-18 NOTE — Evaluation (Signed)
Physical Therapy Evaluation Patient Details Name: Shane [name] MRN: [id number] DOB: 1945/12/21 Today's Date: 06/18/2020   History of Present Illness  Shane [name] is a 74 y.o. male with medical history significant of OSA; asthma; PTSD/depression/anxiety; HTN; DVT; and DM presenting with chest pain. Pt also found to have hypertensive crisis, poor hygiene, and LE edema/scaling.  Clinical Impression  Prior to admission, pt lives alone and uses a cane intermittently. Pt not very forthcoming with PLOF, but from what was obtained, he has been having increased difficulty with lower body bathing and dressing, thus leading to poor hygiene practices. Upon PT evaluation, pt extremely limited by left sided mid-upper back pain which he describes as constant, and "deep, aching." Pain increases with both active and passive shoulder flexion. Pt demonstrates ability to ambulate 50 feet at a min guard assist level with max encouragement. DOE 3/4 towards end of walk, SpO2 95% on RA. Pt reports, "I cannot take care of myself like this." Pt has no other caregiver support. Recommending SNF based on deficits listed above in order to maximize functional independence.     Follow Up Recommendations SNF    Equipment Recommendations  None recommended by PT    Recommendations for Other Services       Precautions / Restrictions Precautions Precautions: Fall Restrictions Weight Bearing Restrictions: No      Mobility  Bed Mobility Overal bed mobility: Modified Independent             General bed mobility comments: HOB (stretcher) elevated    Transfers Overall transfer level: Needs assistance Equipment used: None Transfers: Sit to/from Stand Sit to Stand: Supervision         General transfer comment: Supervision for safety  Ambulation/Gait Ambulation/Gait assistance: Min guard Gait Distance (Feet): 50 Feet Assistive device: None Gait Pattern/deviations: Step-through pattern;Decreased  stride length;Wide base of support Gait velocity: decreased Gait velocity interpretation: <1.8 ft/sec, indicate of risk for recurrent falls General Gait Details: Min guard for safety, no overt LOB noted  Stairs            Wheelchair Mobility    Modified Rankin (Stroke Patients Only)       Balance Overall balance assessment: Needs assistance Sitting-balance support: Feet supported Sitting balance-Leahy Scale: Good     Standing balance support: No upper extremity supported;During functional activity Standing balance-Leahy Scale: Fair                               Pertinent Vitals/Pain Pain Assessment: Faces Faces Pain Scale: Hurts even more Pain Location: L mid to upper back Pain Descriptors / Indicators: Aching Pain Intervention(s): Limited activity within patient's tolerance;Monitored during session    [place] expects to be discharged to:: Private residence Living Arrangements: Alone Available Help at Discharge: Other (Comment) (none) Type of Home: House Home Access: Stairs to enter   [employer] of Steps: 2 Home Layout: One level Home Equipment: Cane - single point      Prior Function Level of Independence: Needs assistance   Gait / Transfers Assistance Needed: uses cane intermittently, denies falls  ADL's / Homemaking Assistance Needed: reports difficulty with LB bathing/dressing        Hand Dominance        Extremity/Trunk Assessment   Upper Extremity Assessment Upper Extremity Assessment: RUE deficits/detail;LUE deficits/detail RUE Deficits / Details: WFL LUE Deficits / Details: Passive shoulder flexion WFL with pain, active shoulder flexion limited to ~  100 degrees    Lower Extremity Assessment Lower Extremity Assessment: RLE deficits/detail;LLE deficits/detail RLE Deficits / Details: Edema, scaling and overgrown toenails noted LLE Deficits / Details: Edema, scaling and overgrown toenails noted     Cervical / Trunk Assessment Cervical / Trunk Assessment: Other exceptions Cervical / Trunk Exceptions: increased body habitus  Communication   Communication: No difficulties  Cognition Arousal/Alertness: Awake/alert Behavior During Therapy: Flat affect Overall Cognitive Status: Impaired/Different from baseline Area of Impairment: Safety/judgement                         Safety/Judgement: Decreased awareness of safety;Decreased awareness of deficits     General Comments: Pt with flat affect, internally distracted by pain, at times does not respond to questions and stays silent. Not very forthcoming with PLOF despite specific questioning. Asked patient to stand several times before he executed      General Comments      Exercises     Assessment/Plan    PT Assessment Patient needs continued PT services  PT Problem List Decreased strength;Decreased balance;Decreased activity tolerance;Decreased mobility;Obesity;Pain       PT Treatment Interventions DME instruction;Gait training;Functional mobility training;Therapeutic activities;Balance training;Stair training;Therapeutic exercise;Patient/family education    PT Goals (Current goals can be found in the Care Plan section)  Acute Rehab PT Goals Patient Stated Goal: less pain PT Goal Formulation: With patient Time For Goal Achievement: 07/02/20 Potential to Achieve Goals: Good    Frequency Min 3X/week   Barriers to discharge Decreased caregiver support      Co-evaluation               AM-PAC PT "6 Clicks" Mobility  Outcome Measure Help needed turning from your back to your side while in a flat bed without using bedrails?: None Help needed moving from lying on your back to sitting on the side of a flat bed without using bedrails?: None Help needed moving to and from a bed to a chair (including a wheelchair)?: A Little Help needed standing up from a chair using your arms (e.g., wheelchair or bedside  chair)?: A Little Help needed to walk in hospital room?: A Little Help needed climbing 3-5 steps with a railing? : A Lot 6 Click Score: 19    End of Session   Activity Tolerance: Patient limited by pain Patient left: in bed;with call bell/phone within reach Nurse Communication: Mobility status PT Visit Diagnosis: Unsteadiness on feet (R26.81);Pain Pain - part of body:  (back)    Time: 8546-2703 PT Time Calculation (min) (ACUTE ONLY): 23 min   Charges:   PT Evaluation $PT Eval Moderate Complexity: 1 Mod PT Treatments $Therapeutic Activity: 8-22 mins        Wyona Almas, PT, DPT Acute Rehabilitation Services Pager [phone] Office [phone]   Deno Etienne 06/18/2020, 1:29 PM

## 2020-06-18 NOTE — Consult Note (Signed)
[facility] Nurse Consult Note: Reason for Consult: Patient consult received for edematous LEs with overgrown toenails and lichenification. Photograph obtained by Dr. Lorin Mercy is appreciated. Wound type:venous insufficiency Pressure Injury POA: NA Measurement:N/A Wound bed: N/A Drainage (amount, consistency, odor) Scant on old dressings. Malodorous is additionally due to poor hygiene. Periwound: See above Dressing procedure/placement/frequency: Orders are provided for the care of this patient's LEs including soap and water cleansing with attention provided to areas between toes. Eucerin cream will be applied to bilateral LEs and feet but not between toes. Following application of Eucerin cream, Bedside RN is to call for Ortho Tech to apply bilateral Unnas' Boots.  Boots will then be changed twice weekly.  Patent will require HHRN for this follow up. Alternatively, referral can be made to the outpatient [facility] of the patient's choosing, but it is noted that typically outpatient WCCs work in concert with [place], and do not manage LE wounds without alternating changes with a home health agency.  Dr. Lorin Mercy is contacted using Secure Chat and the recommendation for a consult to podiatric medicine be considered for toenail debridement while this patient is an inpatient is made.  [facility] nursing team will not follow, but will remain available to this patient, the nursing and medical teams.  Please re-consult if needed. Thanks, Maudie Flakes, MSN, RN, [place], Arther Abbott  Pager# [phone]

## 2020-06-18 NOTE — ED Notes (Signed)
Lunch Tray Ordered @ 1032. 

## 2020-06-18 NOTE — H&P (Signed)
History and Physical    Shane [name]:[id number] DOB: February 04, 1946 DOA: 06/18/2020  PCP: [name], MD Consultants:  [facility] - cardiology; Neldon Mc - allergy; Early - vascular surgery Patient coming from:  Home - lives alone; NOK: [place], [place], [phone]  Chief Complaint: Chest pain  HPI: Shane [name] is a 74 y.o. male with medical history significant of OSA; asthma; PTSD/depression/anxiety; HTN; DVT; and DM presenting with chest pain.  He reports that he developed severe L back pain.  He was listening to the radio when it started.  He sometimes forgets to take his medications.  He has been pretty independent and he feels like it is not a good idea to send someone to his house, "it's not exactly organized."  He has a lot of stuff in his home and they might not be able to get around well.  Denies chest pain but he is unable to take a full breath because of the pain.  He had the pain a couple of days ago and it got better and then got worse.  He denies heavy exertion, reports the pain started spontaneously.    ED Course:  Admitted in June for the same, CP/shoulder pain, very hypertensive.  Confused about home meds on recent cardiology visit.  CTA read pending.  Given Hydralazine.  Troponin ok.  Review of Systems: As per HPI; otherwise review of systems reviewed and negative.   Ambulatory Status:  Ambulates with a cane occasionally  COVID Vaccine Status:   Complete plus booster  Past Medical History:  Diagnosis Date  . Acute asthmatic bronchitis   . Anemia   . Anxiety disorder   . Borderline diabetes mellitus   . Borderline hypertension   . Degenerative joint disease   . Depression   . GERD (gastroesophageal reflux disease)   . History of adenomatous polyp of colon   . History of sinusitis   . Posttraumatic stress disorder   . Sleep apnea    Uses CPAP    Past Surgical History:  Procedure Laterality Date  . COLONOSCOPY    . FINGER SURGERY  Right    index  . NASAL SINUS SURGERY     x 2  . POLYPECTOMY      Social History   Socioeconomic History  . Marital status: Single    Spouse name: Not on file  . Number of children: 1  . Years of education: Not on file  . Highest education level: Not on file  Occupational History  . 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 Smoking status: Former Smoker    Packs/day: 0.50    Years: 25.00    Pack years: 12.50    Types: Cigarettes    Quit date: 07/02/1983    Years since quitting: 36.9  . Smokeless tobacco: Never Used  Vaping Use  . Vaping Use: Never used  Substance and Sexual Activity  . Alcohol use: No  . Drug use: No  . Sexual activity: Not on file  Other Topics Concern  . Not on file  Social History Narrative   Lives alone.  One child.    Social Determinants of Health   Financial Resource Strain: Not on file  Food Insecurity: Not on file  Transportation Needs: Not on file  Physical Activity: Not on file  Stress: Not on file  Social Connections: Not on file  Intimate Partner Violence: Not on file    No Known Allergies  Family History  Problem Relation Age of Onset  . Diabetes Father   . Arthritis Mother        Unknown cause of death  . Colon cancer Neg Hx   . Colon polyps Neg Hx   . Rectal cancer Neg Hx   . Stomach cancer Neg Hx   . Allergic rhinitis Neg Hx   . Angioedema Neg Hx   . Asthma Neg Hx   . Eczema Neg Hx   . Immunodeficiency Neg Hx   . Urticaria Neg Hx     Prior to Admission medications   Medication Sig Start Date End Date Taking? Authorizing Provider  albuterol (VENTOLIN HFA) 108 (90 Base) MCG/ACT inhaler INHALE TWO PUFFS EVERY FOUR TO SIX HOURS AS NEEDED FOR COUGH OR WHEEZE. 04/03/20   Kozlow, Donnamarie Poag, MD  alum & mag hydroxide-simeth [facility] MAXIMUM STRENGTH) 400-400-40 MG/5ML suspension Take 5 mLs by mouth every 6 (six) hours as needed (abdominal pain or indigestion). 06/10/19   Carmin Muskrat, MD   amLODipine (NORVASC) 10 MG tablet Take 1 tablet (10 mg total) by mouth daily. 12/28/19   Samuella Cota, MD  BREO ELLIPTA [phone] MCG/INH AEPB INHALE 1 PUFF BY MOUTH EVERY DAY 03/16/20   Kozlow, Donnamarie Poag, MD  furosemide (LASIX) 40 MG tablet Take 1 tablet (40 mg total) by mouth daily. TAKE 1 TABLET DAILY FOR 4 DAYS. 06/06/20   Minus Breeding, MD  montelukast (SINGULAIR) 10 MG tablet TAKE 1 TABLET BY MOUTH EVERYDAY AT BEDTIME Patient taking differently: Take 10 mg by mouth at bedtime.  09/09/19   Kozlow, Donnamarie Poag, MD  Propylene Glycol (SYSTANE BALANCE OP) Apply 1 drop to eye daily as needed (dry eyes). Reported on 12/19/2015    [provider]  triamcinolone (NASACORT ALLERGY 24HR) 55 MCG/ACT AERO nasal inhaler Use one spray in each nostril once daily as directed. Patient taking differently: Place 1 spray into the nose daily.  01/28/17   Jiles Prows, MD    Physical Exam: Vitals:   06/18/20 0845 06/18/20 0938 06/18/20 1011 06/18/20 1015  BP:  (!) 181/97 (!) 188/90   Pulse: 62 63 62 61  Resp: 18 (!) 21 18 17   Temp:  98.2 F (36.8 C)    TempSrc:  Oral    SpO2: 97% 96% 95% 98%  Weight:      Height:         . General:  Appears calm and comfortable and is in NAD; disheveled and with poor hygiene, malodorous . Eyes:  PERRL, EOMI, normal lids, iris . ENT:  grossly normal hearing, lips & tongue, mmm . Neck:  no LAD, masses or thyromegaly . Cardiovascular:  RRR, no m/r/g.  . Respiratory:   CTA bilaterally with no wheezes/rales/rhonchi.  Normal respiratory effort. . Abdomen:  soft, NT, ND, NABS . Back:   normal alignment, no CVAT; complains of left-sided pain along the thoracic spine without apparent deformity or skin findings . Skin:  Marked LE lichenification and scaling with foul smell but no obvious ulcerations . Musculoskeletal:  grossly normal tone BUE/BLE, no bony abnormality . Psychiatric:  blunted mood and affect, speech slow but appropriate, AOx3 . Neurologic:  CN 2-12  grossly intact, moves all extremities in coordinated fashion    Radiological Exams on Admission: Independently reviewed - see discussion in A/P where applicable  DG Thoracic Spine 2 View  Result Date: 06/18/2020 CLINICAL DATA:  Left shoulder pain beginning last night. EXAM: THORACIC SPINE 2 VIEWS COMPARISON:  None. FINDINGS:  Multilevel degenerative changes. No acute fracture identified. No malalignment. IMPRESSION: Multilevel degenerative changes. No fracture or malalignment. Electronically Signed   By: Dorise Bullion III M.D   On: 06/18/2020 10:06   CT ANGIO CHEST PE W OR WO CONTRAST  Result Date: 06/18/2020 CLINICAL DATA:  Left chest and shoulder pain beginning yesterday. Personal history of DVT. High probability for pulmonary embolism. EXAM: CT ANGIOGRAPHY CHEST WITH CONTRAST TECHNIQUE: Multidetector CT imaging of the chest was performed using the standard protocol during bolus administration of intravenous contrast. Multiplanar CT image reconstructions and MIPs were obtained to evaluate the vascular anatomy. CONTRAST:  20mL OMNIPAQUE IOHEXOL 350 MG/ML SOLN COMPARISON:  12/25/2019 FINDINGS: Cardiovascular: Satisfactory opacification of pulmonary arteries noted, and no pulmonary emboli identified. No evidence of thoracic aortic dissection or aneurysm. Mediastinum/Nodes: No masses or pathologically enlarged lymph nodes identified. Lungs/Pleura: No pulmonary mass, consolidation, or effusion. Mild atelectasis seen in both lung bases which is new since previous study. Upper abdomen: Small hiatal hernia noted. Musculoskeletal: No suspicious bone lesions identified. Review of the MIP images confirms the above findings. IMPRESSION: No evidence of pulmonary embolism. Mild bibasilar atelectasis. Small hiatal hernia. Electronically Signed   By: Marlaine Hind M.D.   On: 06/18/2020 07:52   DG Chest Port 1 View  Result Date: 06/18/2020 CLINICAL DATA:  Chest pain EXAM: PORTABLE CHEST 1 VIEW COMPARISON:   12/25/2019 FINDINGS: The heart size and mediastinal contours are within normal limits. Both lungs are clear. The visualized skeletal structures are unremarkable. IMPRESSION: No active disease. Electronically Signed   By: Ulyses Jarred M.D.   On: 06/18/2020 03:34    EKG: Independently reviewed.  NSR with rate 57; multiple PVCs with no evidence of acute ischemia   Labs on Admission: I have personally reviewed the available labs and imaging studies at the time of the admission.  Pertinent labs:   Glucose 116 BUN 13/Creatinine 1.31/GFR 57 - stable HS troponin 17, 20 BNP 68.1 Platelets 78, new   Assessment/Plan Principal Problem:   Hypertensive crisis Active Problems:   Moderate persistent asthma   Leg swelling   Thrombocytopenia (HCC)   Poor personal hygiene   Stage 3a chronic kidney disease ([place])   Diet-controlled diabetes mellitus (HCC)   Class 2 obesity due to excess calories with body mass index (BMI) of 38.0 to 38.9 in adult   Back pain    Hypertensive crisis -Patient presenting with severe back pain and significantly elevated BP, possibly concerning for hypertensive crisis vs. Chronic uncontrolled HTN -He did not have evidence of end organ failure -He does acknowledge difficulty with medications and so chronic and uncontrolled HTN currently appears to be more likely (see below) -He was given hydralazine and current BP is 188/90 -Based on his BP control no longer in a dangerous range, will observe patient on telemetry rather than placing in progressive -Troponin negative x 2 -Continue Norvasc - although this may be contributing to LE edema (see below) -Will add prn hydralazine  Back pain -Patient reports several days of posterior thoracic back pain on the left side -Shingles is a consideration but he is vaccinated and also does not have skin lesions; would monitor -He also may have had a muscle pull; will give Robaxin as needed -CTA for dissection was negative in the  ER -T-spine xray also unremarkable  Poor hygiene -Patient reported that his shoes and socks got wet and yet he was continuing to wear them -Hygiene was very poor -He is also having difficulty in taking his daily  medications (also reported during recent cardiology visit) -He appears to be having difficulty living independently, and yet reports that no one can come to his home (?hoarding by description) -APS consult requested -TOC consult for [facility] services vs. SNF placement -PT/OT evaluations requested  LE edema -Significant lichenification and scaling of LE -In part, due to hygiene -Seen by cardiology on 12/7 and given Lasix x 4 days and a Lounge Doctor pillow -Also with LE edema (difficult to quantify due to skin changes) -No obvious ulcerations -Wound care consultation  Diet-controlled DM -A1c in 01/2019 was 6.2, will repeat -Cover with moderate-scale SSI  Asthma -Continue Breo and prn Albuterol  -Continue Singulair -Appears to be compensated at this time  Thrombocytopenia -Appears to be a new issue, uncertain etiology -Recheck CBC in AM -Consider outpatient hematology f/u  Stage 3a CKD -Appears to be stable at this time -Will follow while hospitalized  Obesity Body mass index is 38.65 kg/m. -Weight loss should be encouraged -Outpatient PCP/bariatric medicine f/u encouraged     Note: This patient has been tested and is negative for the novel coronavirus COVID-19. The patient has been fully vaccinated against COVID-19.    DVT prophylaxis:   Lovenox Code Status:  DNR - confirmed with patient Family Communication: None present Disposition Plan:  The patient is from: home  Anticipated d/c is to: home with [facility] services, possible need for placement Anticipated d/c date will depend on clinical response to treatment, but possibly as early as tomorrow if she has excellent response to treatment  Patient is currently: acutely ill Consults called: PT/OT/Wound care/TOC team;  APS referral Admission status:  It is my clinical opinion that referral for OBSERVATION is reasonable and necessary in this patient based on the above information provided. The aforementioned taken together are felt to place the patient at high risk for further clinical deterioration. However it is anticipated that the patient may be medically stable for discharge from the hospital within 24 to 48 hours.    Karmen Bongo MD [facility]   How to contact the [facility] Attending or Consulting provider [place] or covering provider during after hours [place], for this patient?  1. Check the care team in [facility] and look for a) attending/consulting TRH provider listed and b) the [facility] team listed 2. Log into [email or web address] and use [place]'s universal password to access. If you do not have the password, please contact the hospital operator. 3. Locate the [facility] provider you are looking for under [facility] and page to a number that you can be directly reached. 4. If you still have difficulty reaching the provider, please page the [facility] (Director on Call) for the Hospitalists listed on amion for assistance.   06/18/2020, 10:53 AM

## 2020-06-18 NOTE — ED Notes (Signed)
Pt returned from xray

## 2020-06-18 NOTE — ED Notes (Signed)
Patient transported to X-ray 

## 2020-06-18 NOTE — ED Notes (Signed)
Pt's CBG result was 124. Informed [place] - RN.

## 2020-06-18 NOTE — ED Provider Notes (Addendum)
Pt d/w Dr. Lorin Mercy ([facility]) who will admit.     CLINICAL DATA: Left chest and shoulder pain beginning yesterday.  Personal history of DVT. High probability for pulmonary embolism.   EXAM:  CT ANGIOGRAPHY CHEST WITH CONTRAST   TECHNIQUE:  Multidetector CT imaging of the chest was performed using the  standard protocol during bolus administration of intravenous  contrast. Multiplanar CT image reconstructions and MIPs were  obtained to evaluate the vascular anatomy.   CONTRAST: 11mL OMNIPAQUE IOHEXOL 350 MG/ML SOLN   COMPARISON: 12/25/2019   FINDINGS:  Cardiovascular: Satisfactory opacification of pulmonary arteries  noted, and no pulmonary emboli identified. No evidence of thoracic  aortic dissection or aneurysm.   Mediastinum/Nodes: No masses or pathologically enlarged lymph nodes  identified.   Lungs/Pleura: No pulmonary mass, consolidation, or effusion. Mild  atelectasis seen in both lung bases which is new since previous  study.   Upper abdomen: Small hiatal hernia noted.   Musculoskeletal: No suspicious bone lesions identified.   Review of the MIP images confirms the above findings.   IMPRESSION:  No evidence of pulmonary embolism.   Mild bibasilar atelectasis.   Small hiatal hernia.         Isla Pence, MD 06/18/20 2122    Isla Pence, MD 06/18/20 [phone]

## 2020-06-18 NOTE — ED Provider Notes (Signed)
[facility] Emergency Department Provider Note MRN:  [id number]  Arrival date & time: 06/18/20     Chief Complaint   Shoulder Pain   History of Present Illness   Shane [name] is a 74 y.o. year-old male with a history of DVT presenting to the ED with chief complaint of shoulder.  Pain in the left back and left shoulder, sudden onset, worse with breathing.  Started yesterday and has been becoming more and more constant.  Moderate to severe.  Denies chest pain or abdominal pain.  Mild shortness of breath.  No fever or cough.  Chronic leg swelling that is largely unchanged recently.  Has not been taking his home medications.  Review of Systems  A complete 10 system review of systems was obtained and all systems are negative except as noted in the HPI and PMH.   Patient's Health History    Past Medical History:  Diagnosis Date  . Acute asthmatic bronchitis   . Anemia   . Anxiety disorder   . Borderline diabetes mellitus   . Borderline hypertension   . Degenerative joint disease   . Depression   . GERD (gastroesophageal reflux disease)   . History of adenomatous polyp of colon   . History of sinusitis   . Posttraumatic stress disorder   . Sleep apnea    Uses CPAP    Past Surgical History:  Procedure Laterality Date  . COLONOSCOPY    . FINGER SURGERY Right    index  . NASAL SINUS SURGERY     x 2  . POLYPECTOMY      Family History  Problem Relation Age of Onset  . Diabetes Father   . Arthritis Mother        Unknown cause of death  . Colon cancer Neg Hx   . Colon polyps Neg Hx   . Rectal cancer Neg Hx   . Stomach cancer Neg Hx   . Allergic rhinitis Neg Hx   . Angioedema Neg Hx   . Asthma Neg Hx   . Eczema Neg Hx   . Immunodeficiency Neg Hx   . Urticaria Neg Hx     Social History   Socioeconomic History  . Marital status: Single    Spouse name: Not on file  . Number of children: 1  . Years of education: Not on file  . Highest  education level: Not on file  Occupational History  . 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 Smoking status: Former Smoker    Packs/day: 0.50    Years: 25.00    Pack years: 12.50    Types: Cigarettes    Quit date: 07/02/1983    Years since quitting: 36.9  . Smokeless tobacco: Never Used  Vaping Use  . Vaping Use: Never used  Substance and Sexual Activity  . Alcohol use: No  . Drug use: No  . Sexual activity: Not on file  Other Topics Concern  . Not on file  Social History Narrative   Lives alone.  One child.    Social Determinants of Health   Financial Resource Strain: Not on file  Food Insecurity: Not on file  Transportation Needs: Not on file  Physical Activity: Not on file  Stress: Not on file  Social Connections: Not on file  Intimate Partner Violence: Not on file     Physical Exam   Vitals:   06/18/20 0605 06/18/20 0700  BP: Marland Kitchen)  184/103 (!) 212/98  Pulse: (!) 55 62  Resp: 15 (!) 25  Temp:    SpO2: 98% 99%    CONSTITUTIONAL: Chronically ill-appearing, NAD NEURO:  Alert and oriented x 3, no focal deficits EYES:  eyes equal and reactive ENT/NECK:  no LAD, no JVD CARDIO: Regular rate, well-perfused, normal S1 and S2 PULM:  CTAB no wheezing or rhonchi GI/GU:  normal bowel sounds, non-distended, non-tender MSK/SPINE:  No gross deformities, no edema SKIN:  no rash, atraumatic PSYCH:  Appropriate speech and behavior  *Additional and/or pertinent findings included in MDM below  Diagnostic and Interventional Summary    EKG Interpretation  Date/Time:  Sunday June 18 2020 02:45:41 EST Ventricular Rate:  57 PR Interval:    QRS Duration: 102 QT Interval:  482 QTC Calculation: 470 R Axis:   15 Text Interpretation: Sinus rhythm Multiple ventricular premature complexes Confirmed by Gerlene Fee [phone]) on 06/18/2020 2:53:26 AM      Labs Reviewed  CBC - Abnormal; Notable for the following components:      Result  Value   RBC 6.88 (*)    MCV 64.7 (*)    MCH 19.6 (*)    RDW 18.8 (*)    Platelets 78 (*)    All other components within normal limits  BASIC METABOLIC PANEL - Abnormal; Notable for the following components:   Glucose, Bld 116 (*)    Creatinine, Ser 1.31 (*)    GFR, Estimated 57 (*)    All other components within normal limits  TROPONIN I (HIGH SENSITIVITY) - Abnormal; Notable for the following components:   Troponin I (High Sensitivity) 20 (*)    All other components within normal limits  BRAIN NATRIURETIC PEPTIDE  TROPONIN I (HIGH SENSITIVITY)    DG Chest Port 1 View  Final Result    CT ANGIO CHEST PE W OR WO CONTRAST    (Results Pending)    Medications  hydrALAZINE (APRESOLINE) injection 10 mg (has no administration in time range)  HYDROmorphone (DILAUDID) injection 0.5 mg (0.5 mg Intravenous Given 06/18/20 0320)  iohexol (OMNIPAQUE) 350 MG/ML injection 75 mL (75 mLs Intravenous Contrast Given 06/18/20 0642)     Procedures  /  Critical Care .Critical Care Performed by: Maudie Flakes, MD Authorized by: Maudie Flakes, MD   Critical care provider statement:    Critical care time (minutes):  35   Critical care was necessary to treat or prevent imminent or life-threatening deterioration of the following conditions: hypertensive urgency.   Critical care was time spent personally by me on the following activities:  Discussions with consultants, evaluation of patient's response to treatment, examination of patient, ordering and performing treatments and interventions, ordering and review of laboratory studies, ordering and review of radiographic studies, pulse oximetry, re-evaluation of patient's condition, obtaining history from patient or surrogate and review of old charts    ED Course and Medical Decision Making  I have reviewed the triage vital signs, the nursing notes, and pertinent available records from the EMR.  Listed above are laboratory and imaging tests that I  personally ordered, reviewed, and interpreted and then considered in my medical decision making (see below for details).  Shoulder pain that is not changed or worsened with range of motion.  Pain is worse with deep breathing.  Considering PE, ACS, less likely dissection.  Awaiting troponin, CTA chest.     CTA results pending but no obvious PE or dissection.  Patient is hypertensive and continues to have pain.  Troponin is minimally elevated.  Has had admission in the past for hypertensive urgency, suspect the same.  Will admit to medicine.  Barth Kirks. Sedonia Small, [place] [email or web address]  Final Clinical Impressions(s) / ED Diagnoses     ICD-10-CM   1. Hypertensive urgency  I16.0     ED Discharge Orders    None       Discharge Instructions Discussed with and Provided to Patient:   Discharge Instructions   None       Maudie Flakes, MD 06/18/20 [phone]

## 2020-06-18 NOTE — ED Notes (Signed)
PT at bedside.

## 2020-06-18 NOTE — ED Notes (Signed)
Patient transported to CT 

## 2020-06-18 NOTE — ED Triage Notes (Addendum)
Pt bib gems c/o non-radiating, left shoulder pain that started approx 10pm last night. Pt denies chest pain. HX of DVT + HTN. 324 mg of aspirin and 50 mcg fentanyl given PTA.

## 2020-06-19 ENCOUNTER — Encounter (HOSPITAL_COMMUNITY): Payer: Self-pay | Admitting: Internal Medicine

## 2020-06-19 ENCOUNTER — Telehealth: Payer: Self-pay | Admitting: Podiatry

## 2020-06-19 ENCOUNTER — Other Ambulatory Visit: Payer: Self-pay

## 2020-06-19 ENCOUNTER — Inpatient Hospital Stay (HOSPITAL_COMMUNITY): Payer: PPO

## 2020-06-19 DIAGNOSIS — F4310 Post-traumatic stress disorder, unspecified: Secondary | ICD-10-CM | POA: Diagnosis present

## 2020-06-19 DIAGNOSIS — Z20822 Contact with and (suspected) exposure to covid-19: Secondary | ICD-10-CM | POA: Diagnosis present

## 2020-06-19 DIAGNOSIS — Z86718 Personal history of other venous thrombosis and embolism: Secondary | ICD-10-CM | POA: Diagnosis not present

## 2020-06-19 DIAGNOSIS — E1122 Type 2 diabetes mellitus with diabetic chronic kidney disease: Secondary | ICD-10-CM | POA: Diagnosis present

## 2020-06-19 DIAGNOSIS — I160 Hypertensive urgency: Secondary | ICD-10-CM | POA: Diagnosis present

## 2020-06-19 DIAGNOSIS — K219 Gastro-esophageal reflux disease without esophagitis: Secondary | ICD-10-CM | POA: Diagnosis present

## 2020-06-19 DIAGNOSIS — J4540 Moderate persistent asthma, uncomplicated: Secondary | ICD-10-CM | POA: Diagnosis present

## 2020-06-19 DIAGNOSIS — Z66 Do not resuscitate: Secondary | ICD-10-CM | POA: Diagnosis present

## 2020-06-19 DIAGNOSIS — R079 Chest pain, unspecified: Secondary | ICD-10-CM

## 2020-06-19 DIAGNOSIS — Z833 Family history of diabetes mellitus: Secondary | ICD-10-CM | POA: Diagnosis not present

## 2020-06-19 DIAGNOSIS — Z7951 Long term (current) use of inhaled steroids: Secondary | ICD-10-CM | POA: Diagnosis not present

## 2020-06-19 DIAGNOSIS — I169 Hypertensive crisis, unspecified: Secondary | ICD-10-CM

## 2020-06-19 DIAGNOSIS — D696 Thrombocytopenia, unspecified: Secondary | ICD-10-CM | POA: Diagnosis present

## 2020-06-19 DIAGNOSIS — Z87891 Personal history of nicotine dependence: Secondary | ICD-10-CM | POA: Diagnosis not present

## 2020-06-19 DIAGNOSIS — I129 Hypertensive chronic kidney disease with stage 1 through stage 4 chronic kidney disease, or unspecified chronic kidney disease: Secondary | ICD-10-CM | POA: Diagnosis present

## 2020-06-19 DIAGNOSIS — F419 Anxiety disorder, unspecified: Secondary | ICD-10-CM | POA: Diagnosis present

## 2020-06-19 DIAGNOSIS — M25512 Pain in left shoulder: Secondary | ICD-10-CM | POA: Diagnosis present

## 2020-06-19 DIAGNOSIS — F32A Depression, unspecified: Secondary | ICD-10-CM | POA: Diagnosis present

## 2020-06-19 DIAGNOSIS — J9811 Atelectasis: Secondary | ICD-10-CM | POA: Diagnosis present

## 2020-06-19 DIAGNOSIS — R609 Edema, unspecified: Secondary | ICD-10-CM | POA: Diagnosis not present

## 2020-06-19 DIAGNOSIS — Z8261 Family history of arthritis: Secondary | ICD-10-CM | POA: Diagnosis not present

## 2020-06-19 DIAGNOSIS — Z86010 Personal history of colonic polyps: Secondary | ICD-10-CM | POA: Diagnosis not present

## 2020-06-19 DIAGNOSIS — N1831 Chronic kidney disease, stage 3a: Secondary | ICD-10-CM | POA: Diagnosis present

## 2020-06-19 DIAGNOSIS — G4733 Obstructive sleep apnea (adult) (pediatric): Secondary | ICD-10-CM | POA: Diagnosis present

## 2020-06-19 DIAGNOSIS — Z6838 Body mass index (BMI) 38.0-38.9, adult: Secondary | ICD-10-CM | POA: Diagnosis not present

## 2020-06-19 DIAGNOSIS — Z79899 Other long term (current) drug therapy: Secondary | ICD-10-CM | POA: Diagnosis not present

## 2020-06-19 DIAGNOSIS — R52 Pain, unspecified: Secondary | ICD-10-CM | POA: Diagnosis not present

## 2020-06-19 DIAGNOSIS — I82441 Acute embolism and thrombosis of right tibial vein: Secondary | ICD-10-CM | POA: Diagnosis present

## 2020-06-19 LAB — ECHOCARDIOGRAM COMPLETE
AR max vel: 2.76 cm2
AV Area VTI: 2.32 cm2
AV Area mean vel: 2.56 cm2
AV Mean grad: 7 mmHg
AV Peak grad: 14.6 mmHg
Ao pk vel: 1.91 m/s
Area-P 1/2: 2.16 cm2
Height: 74 in
S' Lateral: 2.3 cm
Weight: 4505.6 oz

## 2020-06-19 LAB — GLUCOSE, CAPILLARY
Glucose-Capillary: 103 mg/dL — ABNORMAL HIGH (ref 70–99)
Glucose-Capillary: 113 mg/dL — ABNORMAL HIGH (ref 70–99)
Glucose-Capillary: 101 mg/dL — ABNORMAL HIGH (ref 70–99)
Glucose-Capillary: 89 mg/dL (ref 70–99)

## 2020-06-19 LAB — BASIC METABOLIC PANEL
Anion gap: 11 (ref 5–15)
BUN: 12 mg/dL (ref 8–23)
CO2: 25 mmol/L (ref 22–32)
Calcium: 9.1 mg/dL (ref 8.9–10.3)
Chloride: 100 mmol/L (ref 98–111)
Creatinine, Ser: 1.22 mg/dL (ref 0.61–1.24)
GFR, Estimated: >60 mL/min (ref >60)
Glucose, Bld: 107 mg/dL — ABNORMAL HIGH (ref 70–99)
Potassium: 3.6 mmol/L (ref 3.5–5.1)
Sodium: 136 mmol/L (ref 135–145)

## 2020-06-19 LAB — CBC
HCT: 43.4 % (ref 39.0–52.0)
Hemoglobin: 13.4 g/dL (ref 13.0–17.0)
MCH: 19.6 pg — ABNORMAL LOW (ref 26.0–34.0)
MCHC: 30.9 g/dL (ref 30.0–36.0)
MCV: 63.4 fL — ABNORMAL LOW (ref 80.0–100.0)
Platelets: 175 10*3/uL (ref 150–400)
RBC: 6.85 MIL/uL — ABNORMAL HIGH (ref 4.22–5.81)
RDW: 17.9 % — ABNORMAL HIGH (ref 11.5–15.5)
WBC: 7.1 10*3/uL (ref 4.0–10.5)
nRBC: 0.3 % — ABNORMAL HIGH (ref 0.0–0.2)

## 2020-06-19 LAB — VITAMIN B12: Vitamin B-12: 215 pg/mL (ref 180–914)

## 2020-06-19 LAB — TSH: TSH: 9.93 u[IU]/mL — ABNORMAL HIGH (ref 0.350–4.500)

## 2020-06-19 MED ORDER — LOSARTAN POTASSIUM 50 MG PO TABS
50.0000 mg | ORAL_TABLET | Freq: Every day | ORAL | Status: DC
  Administered 2020-06-19: 11:00:00 50 mg via ORAL
  Filled 2020-06-19: qty 1

## 2020-06-19 MED ORDER — METHOCARBAMOL 1000 MG/10ML IJ SOLN
500.0000 mg | Freq: Four times a day (QID) | INTRAVENOUS | Status: DC | PRN
  Filled 2020-06-19: qty 5

## 2020-06-19 MED ORDER — ZOLPIDEM TARTRATE 5 MG PO TABS
2.5000 mg | ORAL_TABLET | Freq: Once | ORAL | Status: AC
  Administered 2020-06-19: 2.5 mg via ORAL
  Filled 2020-06-19: qty 1

## 2020-06-19 MED ORDER — HYDROCODONE-ACETAMINOPHEN 5-325 MG PO TABS: 1.0000 | ORAL_TABLET | Freq: Four times a day (QID) | ORAL | Status: DC | PRN

## 2020-06-19 NOTE — Progress Notes (Signed)
RN has washed patient's feet and bilateral lower extremities and Eucerin cream applied (did not apply Eucerin cream between toes).  Ortho tech notified of completion and order for application of UNNA boots.

## 2020-06-19 NOTE — Telephone Encounter (Signed)
Hospital Consult has been requested for patient regarding RFC, patient is experiencing minor memory lost. Patient is located [place].    Provider Contact  Dr.Joseph [phone]   Please Advise

## 2020-06-19 NOTE — Progress Notes (Signed)
Nutrition Education Note  RD consulted received for nutrition goals.  Spoke with patient who reports eating well at home and has eaten 100% of both meals today. Currently on a heart healthy, CHO modified diet. Nutrition focused physical exam completed.  No muscle or subcutaneous fat depletion noticed. Patient states he has lost a few pounds because he has been eating "less starches." Weight loss of 5% within 3 months is not significant for the time frame. He would like to lose even more weight.   Lab Results  Component Value Date   HGBA1C 5.8 (H) 06/18/2020     RD provided "Heart Healthy, Consistent Carbohydrate Nutrition Therapy" handout from the Academy of Nutrition and Dietetics. Provided examples on ways to decrease sodium intake in diet. Discouraged intake of processed foods and use of salt shaker. Encouraged fresh fruits and vegetables as well as whole grain sources of carbohydrates to maximize fiber intake.   Body mass index is 36.16 kg/m. Pt meets criteria for obesity based on current BMI.  Current diet order is heart healthy, CHO modified, patient is consuming approximately 100% of meals at this time. Labs and medications reviewed. No further nutrition interventions warranted at this time. RD contact information provided. If additional nutrition issues arise, please re-consult RD.   Shane [name], RD, LDN, CNSC Please refer to [facility] for contact information.

## 2020-06-19 NOTE — Progress Notes (Signed)
Pt stated that he felt like he had blurred vision about a week ago, but was unsure. Pt is very poor historian about times. Stated that he has noticed he has felt a little dizzy this evening. His SBP has been around 100 this evening. Carroll Kinds RN

## 2020-06-19 NOTE — Progress Notes (Signed)
PROGRESS NOTE    Shane [name]  EVO:[id number] DOB: 02/15/1946 DOA: 06/18/2020 PCP: [name], MD  Brief Narrative: 74 year old male with history of sleep apnea, asthma, depression, anxiety, PTSD, diabetes mellitus presented to the ED with left shoulder, mid/left scapular pain, he denied any shortness of breath -In the ED he was found to be significantly hypertensive with blood pressures in the 200s, EKG without acute findings and CTA chest was negative for PE or any acute findings   Assessment & Plan:      Hypertensive crisis -Blood pressures in the 200s yesterday -Continue amlodipine, add losartan -Stable today, monitor  Left shoulder/intrascapular pain -CTA chest negative for PE or dissection -No skin findings concerning for shingles -Suspect this is musculoskeletal -Also check 2D echocardiogram for completeness, rule out pericarditis -Continue Robaxin as needed and Vicodin  Lower extremity edema with significant lichenification/scaling Poor hygiene -No obvious ulcers -Also has extremely long and curved toenails, will need podiatry assistance  for this  Mild memory and cognitive deficits -Check B12 and TSH -PT OT eval  Moderate persistent asthma -Continue Breo and prn albuterol  Stage IIIa CKD -Stable  Obesity -Needs diet, lifestyle modification  DVT prophylaxis: Lovenox Code Status: DNR Family Communication: Discussed with patient in detail, no family at bedside Disposition Plan:  Status is: Observation  The patient will require care spanning > 2 midnights and should be moved to inpatient because: Unsafe d/c plan  Dispo: The patient is from: Home              Anticipated d/c is to: SNF              Anticipated d/c date is: 1 day              Patient currently is not medically stable to d/c.   Consultants:    Procedures:   Antimicrobials:    Subjective: -Had some left upper back, shoulder pain yesterday which resolved -Denies any  chest pain or shortness of breath denies nausea vomiting or diarrhea  Objective: Vitals:   06/19/20 0010 06/19/20 0400 06/19/20 0412 06/19/20 0841  BP: (!) 172/97 (!) 149/99    Pulse: 72 76    Resp: 18 16    Temp: 98.7 F (37.1 C) 98.7 F (37.1 C)    TempSrc: Oral Oral    SpO2: 97% 97%  98%  Weight:   127.7 kg   Height:        Intake/Output Summary (Last 24 hours) at 06/19/2020 1118 Last data filed at 06/19/2020 0400 Gross per 24 hour  Intake 413 ml  Output 350 ml  Net 63 ml   Filed Weights   06/18/20 0249 06/18/20 1646 06/19/20 0412  Weight: (!) 136.5 kg 128.7 kg 127.7 kg    Examination:  General exam: Elderly pleasant male sitting up in bed, awake alert oriented to self place, mild cognitive deficits CVS: S1-S2, regular rate rhythm Lungs: Clear bilaterally Abdomen: Soft, nontender, bowel sounds present Extremities, significantly lichenified skin with scaling, no obvious ulcers, large curved toenails  Skin: As above Psychiatry: Appropriate mood, flat affect    Data Reviewed:   CBC: Recent Labs  Lab 06/18/20 0311 06/19/20 0339  WBC 6.2 7.1  HGB 13.5 13.4  HCT 44.5 43.4  MCV 64.7* 63.4*  PLT 78* 299   Basic Metabolic Panel: Recent Labs  Lab 06/18/20 0510 06/19/20 0339  NA 138 136  K 4.0 3.6  CL 98 100  CO2 25 25  GLUCOSE 116* 107*  BUN 13 12  CREATININE 1.31* 1.22  CALCIUM 9.6 9.1   GFR: Estimated Creatinine Clearance: 74.3 mL/min (by C-G formula based on SCr of 1.22 mg/dL). Liver Function Tests: No results for input(s): AST, ALT, ALKPHOS, BILITOT, PROT, ALBUMIN in the last 168 hours. No results for input(s): LIPASE, AMYLASE in the last 168 hours. No results for input(s): AMMONIA in the last 168 hours. Coagulation Profile: No results for input(s): INR, PROTIME in the last 168 hours. Cardiac Enzymes: No results for input(s): CKTOTAL, CKMB, CKMBINDEX, TROPONINI in the last 168 hours. BNP (last 3 results) No results for input(s): PROBNP in  the last 8760 hours. HbA1C: Recent Labs    06/18/20 1108  HGBA1C 5.8*   CBG: Recent Labs  Lab 06/18/20 1308 06/18/20 1702 06/18/20 2215 06/19/20 0751  GLUCAP 124* 122* 111* 103*   Lipid Profile: No results for input(s): CHOL, HDL, LDLCALC, TRIG, CHOLHDL, LDLDIRECT in the last 72 hours. Thyroid Function Tests: Recent Labs    06/18/20 0940  TSH 3.233   Anemia Panel: No results for input(s): VITAMINB12, FOLATE, FERRITIN, TIBC, IRON, RETICCTPCT in the last 72 hours. Urine analysis:    Component Value Date/Time   COLORURINE YELLOW 12/27/2019 1136   APPEARANCEUR CLEAR 12/27/2019 1136   LABSPEC 1.017 12/27/2019 1136   PHURINE 5.0 12/27/2019 1136   GLUCOSEU NEGATIVE 12/27/2019 1136   HGBUR NEGATIVE 12/27/2019 1136   BILIRUBINUR NEGATIVE 12/27/2019 1136   KETONESUR NEGATIVE 12/27/2019 1136   PROTEINUR NEGATIVE 12/27/2019 1136   NITRITE NEGATIVE 12/27/2019 1136   LEUKOCYTESUR NEGATIVE 12/27/2019 1136   Sepsis Labs: @LABRCNTIP (procalcitonin:4,lacticidven:4)  ) Recent Results (from the past 240 hour(s))  Resp Panel by RT-PCR (Flu A&B, Covid) Nasopharyngeal Swab     Status: None   Collection Time: 06/18/20  8:17 AM   Specimen: Nasopharyngeal Swab; Nasopharyngeal(NP) swabs in vial transport medium  Result Value Ref Range Status   SARS Coronavirus 2 by RT PCR NEGATIVE NEGATIVE Final    Comment: (NOTE) SARS-CoV-2 target nucleic acids are NOT DETECTED.  The SARS-CoV-2 RNA is generally detectable in upper respiratory specimens during the acute phase of infection. The lowest concentration of SARS-CoV-2 viral copies this assay can detect is 138 copies/mL. A negative result does not preclude SARS-Cov-2 infection and should not be used as the sole basis for treatment or other patient management decisions. A negative result may occur with  improper specimen collection/handling, submission of specimen other than nasopharyngeal swab, presence of viral mutation(s) within the areas  targeted by this assay, and inadequate number of viral copies(<138 copies/mL). A negative result must be combined with clinical observations, patient history, and epidemiological information. The expected result is Negative.  Fact Sheet for Patients:  EntrepreneurPulse.com.au  Fact Sheet for Healthcare Providers:  IncredibleEmployment.be  This test is no t yet approved or cleared by the [place] FDA and  has been authorized for detection and/or diagnosis of SARS-CoV-2 by FDA under an Emergency Use Authorization (EUA). This EUA will remain  in effect (meaning this test can be used) for the duration of the COVID-19 declaration under Section 564(b)(1) of the Act, 21 U.S.C.section 360bbb-3(b)(1), unless the authorization is terminated  or revoked sooner.       Influenza A by PCR NEGATIVE NEGATIVE Final   Influenza B by PCR NEGATIVE NEGATIVE Final    Comment: (NOTE) The Xpert Xpress SARS-CoV-2/FLU/RSV plus assay is intended as an aid in the diagnosis of influenza from Nasopharyngeal swab specimens and should not be used as a sole basis for treatment.  Nasal washings and aspirates are unacceptable for Xpert Xpress SARS-CoV-2/FLU/RSV testing.  Fact Sheet for Patients: EntrepreneurPulse.com.au  Fact Sheet for Healthcare Providers: IncredibleEmployment.be  This test is not yet approved or cleared by the [place] FDA and has been authorized for detection and/or diagnosis of SARS-CoV-2 by FDA under an Emergency Use Authorization (EUA). This EUA will remain in effect (meaning this test can be used) for the duration of the COVID-19 declaration under Section 564(b)(1) of the Act, 21 U.S.C. section 360bbb-3(b)(1), unless the authorization is terminated or revoked.  Performed at [facility], [place] [address], [place], [place] [address]          Radiology Studies: DG Thoracic Spine 2  View  Result Date: 06/18/2020 CLINICAL DATA:  Left shoulder pain beginning last night. EXAM: THORACIC SPINE 2 VIEWS COMPARISON:  None. FINDINGS: Multilevel degenerative changes. No acute fracture identified. No malalignment. IMPRESSION: Multilevel degenerative changes. No fracture or malalignment. Electronically Signed   By: Dorise Bullion III M.D   On: 06/18/2020 10:06   CT ANGIO CHEST PE W OR WO CONTRAST  Result Date: 06/18/2020 CLINICAL DATA:  Left chest and shoulder pain beginning yesterday. Personal history of DVT. High probability for pulmonary embolism. EXAM: CT ANGIOGRAPHY CHEST WITH CONTRAST TECHNIQUE: Multidetector CT imaging of the chest was performed using the standard protocol during bolus administration of intravenous contrast. Multiplanar CT image reconstructions and MIPs were obtained to evaluate the vascular anatomy. CONTRAST:  42mL OMNIPAQUE IOHEXOL 350 MG/ML SOLN COMPARISON:  12/25/2019 FINDINGS: Cardiovascular: Satisfactory opacification of pulmonary arteries noted, and no pulmonary emboli identified. No evidence of thoracic aortic dissection or aneurysm. Mediastinum/Nodes: No masses or pathologically enlarged lymph nodes identified. Lungs/Pleura: No pulmonary mass, consolidation, or effusion. Mild atelectasis seen in both lung bases which is new since previous study. Upper abdomen: Small hiatal hernia noted. Musculoskeletal: No suspicious bone lesions identified. Review of the MIP images confirms the above findings. IMPRESSION: No evidence of pulmonary embolism. Mild bibasilar atelectasis. Small hiatal hernia. Electronically Signed   By: Marlaine Hind M.D.   On: 06/18/2020 07:52   DG Chest Port 1 View  Result Date: 06/18/2020 CLINICAL DATA:  Chest pain EXAM: PORTABLE CHEST 1 VIEW COMPARISON:  12/25/2019 FINDINGS: The heart size and mediastinal contours are within normal limits. Both lungs are clear. The visualized skeletal structures are unremarkable. IMPRESSION: No active disease.  Electronically Signed   By: Ulyses Jarred M.D.   On: 06/18/2020 03:34   Scheduled Meds: . amLODipine  10 mg Oral Daily  . docusate sodium  100 mg Oral BID  . enoxaparin (LOVENOX) injection  40 mg Subcutaneous Q24H  . fluticasone furoate-vilanterol  1 puff Inhalation Daily  . hydrocerin  1 application Topical Once per day on Sun Tue Fri  . insulin aspart  0-15 Units Subcutaneous TID WC  . losartan  50 mg Oral Daily  . montelukast  10 mg Oral QHS  . sodium chloride flush  3 mL Intravenous Q12H  . triamcinolone  1 spray Nasal Daily   Continuous Infusions: . methocarbamol (ROBAXIN) IV       LOS: 0 days    Time spent: 63min  Domenic Polite, MD [facility] 06/19/2020, 11:18 AM

## 2020-06-19 NOTE — Progress Notes (Signed)
  Echocardiogram 2D Echocardiogram has been performed.  Shane [name] 06/19/2020, 5:08 PM

## 2020-06-19 NOTE — Evaluation (Signed)
Occupational Therapy Evaluation Patient Details Name: Shane [name] MRN: [id number] DOB: 1946/04/26 Today's Date: 06/19/2020    History of Present Illness Shane [name] is a 74 y.o. male with medical history significant of OSA; asthma; PTSD/depression/anxiety; HTN; DVT; and DM presenting with chest pain. Pt also found to have hypertensive crisis, poor hygiene, and LE edema/scaling.   Clinical Impression   Pt presents with decline in function and safety with ADLs and ADL mobility with impaired balance, endurance and cognition (pt with decreased awareness of deficits). Pt with functional impairments listed below. Pt distracted by pain, constantly asking what different things athritis can result in and what therapy can do for him, pt asking same questions over and over, rambling at times, required redirection multiple times to initiate tasks and stay on task. Pt would benefit from acute OT services to maximize level of function and safety    Follow Up Recommendations  SNF    Equipment Recommendations  Other (comment) (TBD at next venue of care)    Recommendations for Other Services       Precautions / Restrictions Precautions Precautions: Fall Restrictions Weight Bearing Restrictions: No      Mobility Bed Mobility Overal bed mobility: Modified Independent                  Transfers Overall transfer level: Needs assistance Equipment used: None Transfers: Sit to/from Stand Sit to Stand: Supervision         General transfer comment: Supervision for safety    Balance Overall balance assessment: Needs assistance Sitting-balance support: Feet supported Sitting balance-Leahy Scale: Good     Standing balance support: No upper extremity supported;During functional activity Standing balance-Leahy Scale: Fair                             ADL either performed or assessed with clinical judgement   ADL Overall ADL's : Needs  assistance/impaired Eating/Feeding: Independent   Grooming: Wash/dry hands;Wash/dry face;Standing;Min guard   Upper Body Bathing: Modified independent   Lower Body Bathing: Min guard;Sit to/from stand   Upper Body Dressing : Modified independent   Lower Body Dressing: Min guard;Sit to/from stand   Toilet Transfer: Supervision/safety;Ambulation;Regular Toilet;Grab bars   Toileting- Clothing Manipulation and Hygiene: Supervision/safety;Sit to/from stand   Tub/ Banker: Min guard;Grab bars   Functional mobility during ADLs: Supervision/safety;Modified independent;Cueing for safety       Vision Patient Visual Report: No change from baseline       Perception     Praxis      Pertinent Vitals/Pain Pain Assessment: Faces Faces Pain Scale: Hurts little more Pain Location: L mid to upper back Pain Descriptors / Indicators: Aching Pain Intervention(s): Monitored during session;Repositioned     Hand Dominance Right   Extremity/Trunk Assessment Upper Extremity Assessment Upper Extremity Assessment: LUE deficits/detail LUE Deficits / Details: Passive shoulder flexion WFL with pain, active shoulder flexion limited to ~100 degrees   Lower Extremity Assessment Lower Extremity Assessment: Defer to PT evaluation       Communication Communication Communication: No difficulties   Cognition Arousal/Alertness: Awake/alert Behavior During Therapy: Flat affect Overall Cognitive Status: Impaired/Different from baseline Area of Impairment: Safety/judgement;Memory                         Safety/Judgement: Decreased awareness of safety;Decreased awareness of deficits     General Comments: pt distracted by pain, constantly asking what different things athritis  can result in and what therapy can do for him, pt asking same questions over and over, rambling at times, required redirection multiple times to initiate tasks and stay on task   General Comments        Exercises     Shoulder Instructions      Home Living Family/patient expects to be discharged to:: Private residence Living Arrangements: Alone Available Help at Discharge: Other (Comment) Type of Home: House Home Access: Stairs to enter [employer] of Steps: 2   Home Layout: One level     Bathroom Shower/Tub: Tub/shower unit         Home Equipment: Cane - single point          Prior Functioning/Environment Level of Independence: Needs assistance  Gait / Transfers Assistance Needed: uses cane intermittently, denies falls ADL's / Homemaking Assistance Needed: reports difficulty with LB bathing/dressing            OT Problem List: Decreased activity tolerance;Decreased knowledge of use of DME or AE;Decreased cognition;Impaired balance (sitting and/or standing);Decreased safety awareness;Pain      OT Treatment/Interventions:      OT Goals(Current goals can be found in the care plan section) Acute Rehab OT Goals Patient Stated Goal: less pain OT Goal Formulation: With patient Time For Goal Achievement: 07/03/20 Potential to Achieve Goals: Good ADL Goals Pt Will Perform Grooming: with supervision;with set-up;with modified independence;standing Pt Will Perform Lower Body Bathing: with supervision;with set-up;sit to/from stand Pt Will Perform Lower Body Dressing: with supervision;with set-up;sit to/from stand Pt Will Transfer to Toilet: with modified independence;ambulating Pt Will Perform Tub/Shower Transfer: with supervision;with modified independence;ambulating  OT Frequency:     Barriers to D/C:            Co-evaluation              AM-PAC OT "6 Clicks" Daily Activity     Outcome Measure Help from another person eating meals?: None Help from another person taking care of personal grooming?: A Little Help from another person toileting, which includes using toliet, bedpan, or urinal?: A Little Help from another person bathing (including  washing, rinsing, drying)?: A Little Help from another person to put on and taking off regular upper body clothing?: None Help from another person to put on and taking off regular lower body clothing?: A Little 6 Click Score: 20   End of Session Nurse Communication: Other (comment) (pt distracted by pain and arthritis issues)  Activity Tolerance: Patient tolerated treatment well Patient left: in bed;with call bell/phone within reach  OT Visit Diagnosis: Other abnormalities of gait and mobility (R26.89);Pain;Other symptoms and signs involving cognitive function Pain - Right/Left:  (bilaterally) Pain - part of body: Leg                Time: 2694-8546 OT Time Calculation (min): 21 min Charges:  OT General Charges $OT Visit: 1 Visit OT Evaluation $OT Eval Moderate Complexity: 1 Mod OT Treatments $Self Care/Home Management : 8-22 mins   Britt Bottom 06/19/2020, 1:47 PM

## 2020-06-19 NOTE — TOC Initial Note (Signed)
Transition of Care [facility]) - Initial/Assessment Note    Patient Details  Name: Shane [name] MRN: [id number] Date of Birth: 02/11/1946  Transition of [facility]) CM/SW Contact:    Trula Ore, [place] Phone Number: 06/19/2020, 3:16 PM  Clinical Narrative:                  CSW received consult for possible SNF placement at time of discharge. CSW spoke with patient regarding PT recommendation of SNF placement at time of discharge.  Patient expressed understanding of PT recommendation and declined SNF placement at time of discharge.No further questions reported at this time. CSW to continue to follow and assist with discharge planning needs.  Expected Discharge Plan: Home/Self Care Barriers to Discharge: Continued Medical Work up   Patient Goals and CMS Choice Patient states their goals for this hospitalization and ongoing recovery are:: to go home CMS Medicare.gov Compare Post Acute Care list provided to:: Patient Choice offered to / list presented to : Patient  Expected Discharge Plan and Services Expected Discharge Plan: Home/Self Care       Living arrangements for the past 2 months: Single Family Home                                      Prior Living Arrangements/Services Living arrangements for the past 2 months: Single Family Home Lives with:: Self Patient language and need for interpreter reviewed:: Yes Do you feel safe going back to the place where you live?: Yes      Need for Family Participation in Patient Care: Yes (Comment) Care giver support system in place?: Yes (comment)   Criminal Activity/Legal Involvement Pertinent to Current Situation/Hospitalization: No - Comment as needed  Activities of Daily Living Home Assistive Devices/Equipment: Cane (specify quad or straight) ADL Screening (condition at time of admission) Patient's cognitive ability adequate to safely complete daily activities?: Yes Is the patient deaf or have difficulty hearing?:  No Does the patient have difficulty seeing, even when wearing glasses/contacts?: No Does the patient have difficulty concentrating, remembering, or making decisions?: Yes Patient able to express need for assistance with ADLs?: Yes Does the patient have difficulty dressing or bathing?: No Independently performs ADLs?: Yes (appropriate for developmental age) Does the patient have difficulty walking or climbing stairs?: Yes Weakness of Legs: Both Weakness of Arms/Hands: None  Permission Sought/Granted Permission sought to share information with : Case Manager,Family Chief Financial Officer                Emotional Assessment Appearance:: Appears stated age Attitude/Demeanor/Rapport: Gracious Affect (typically observed): Calm Orientation: : Oriented to Self,Oriented to Place,Oriented to  Time,Oriented to Situation Alcohol / Substance Use: Not Applicable Psych Involvement: No (comment)  Admission diagnosis:  Hypertensive crisis [I16.9] Back pain [M54.9] Hypertensive urgency [I16.0] Patient Active Problem List   Diagnosis Date Noted  . Hypertensive urgency 06/19/2020  . Hypertensive crisis 06/18/2020  . Thrombocytopenia ([place]) 06/18/2020  . Poor personal hygiene 06/18/2020  . Stage 3a chronic kidney disease ([place]) 06/18/2020  . Diet-controlled diabetes mellitus ([place]) 06/18/2020  . Class 2 obesity due to excess calories with body mass index (BMI) of 38.0 to 38.9 in adult 06/18/2020  . Back pain 06/18/2020  . Leg swelling 06/04/2020  . Educated about COVID-19 virus infection 02/03/2020  . Precordial chest pain 02/03/2020  . Hypertensive emergency 12/26/2019  . ACS (acute coronary syndrome) ([place]) 12/26/2019  . AKI (  acute kidney injury) ([place]) 12/26/2019  . Moderate persistent asthma 12/25/2019  . Right femoral vein DVT ([place]) 03/01/2019  . Bradycardia 07/30/2018  . Sleep apnea 07/30/2018  . Murmur 04/30/2018  . Dizziness 04/30/2018  . Palpitations 04/06/2016  .  Elevated blood pressure reading without diagnosis of hypertension 01/12/2016  . Sinusitis, chronic 12/19/2015  . Pain in joint, upper arm 11/23/2015  . Numbness and tingling in left arm 11/23/2015  . Asthmatic bronchitis 02/21/2015  . PTSD (post-traumatic stress disorder) 02/21/2015  . Hemorrhoids 06/15/2014  . Microcytic anemia 03/31/2008  . GERD 03/31/2008  . Impaired fasting glucose 03/31/2008   PCP:  [name], MD Pharmacy:   [id number] - [place], [place] [address] [place] [address] Phone: [phone] Fax: [phone]     Social Determinants of Health (SDOH) Interventions    Readmission Risk Interventions No flowsheet data found.

## 2020-06-19 NOTE — Progress Notes (Signed)
Orthopedic Tech Progress Note Patient Details:  Shane [name] 04/01/1946 [id number]  Ortho Devices Type of Ortho Device: Louretta Parma boot Ortho Device/Splint Location: bi lateral Ortho Device/Splint Interventions: Ordered,Application,Adjustment   Post Interventions Patient Tolerated: Well Instructions Provided: Care of device,Adjustment of device   Karolee Stamps 06/19/2020, 5:34 AM

## 2020-06-20 ENCOUNTER — Inpatient Hospital Stay (HOSPITAL_COMMUNITY): Payer: PPO

## 2020-06-20 DIAGNOSIS — R52 Pain, unspecified: Secondary | ICD-10-CM

## 2020-06-20 DIAGNOSIS — R609 Edema, unspecified: Secondary | ICD-10-CM

## 2020-06-20 LAB — T4, FREE: Free T4: 0.79 ng/dL (ref 0.61–1.12)

## 2020-06-20 LAB — GLUCOSE, CAPILLARY
Glucose-Capillary: 97 mg/dL (ref 70–99)
Glucose-Capillary: 90 mg/dL (ref 70–99)
Glucose-Capillary: 114 mg/dL — ABNORMAL HIGH (ref 70–99)
Glucose-Capillary: 84 mg/dL (ref 70–99)

## 2020-06-20 MED ORDER — SODIUM CHLORIDE 0.9 % IV BOLUS
500.0000 mL | Freq: Once | INTRAVENOUS | Status: AC
  Administered 2020-06-20: 01:00:00 500 mL via INTRAVENOUS

## 2020-06-20 MED ORDER — RIVAROXABAN 20 MG PO TABS: 20.0000 mg | ORAL_TABLET | Freq: Every day | ORAL | Status: DC

## 2020-06-20 MED ORDER — RIVAROXABAN 15 MG PO TABS
15.0000 mg | ORAL_TABLET | Freq: Two times a day (BID) | ORAL | Status: DC
  Administered 2020-06-20 – 2020-06-21 (×2): 15 mg via ORAL
  Filled 2020-06-20 (×2): qty 1

## 2020-06-20 NOTE — Progress Notes (Signed)
ANTICOAGULATION CONSULT NOTE - Initial Consult  Pharmacy Consult for Xarelto Indication: DVT  No Known Allergies  Patient Measurements: Height: 6\' 2"  (188 cm) Weight: 130.2 kg (287 lb 0.6 oz) IBW/kg (Calculated) : 82.2  Vital Signs: Temp: 97.8 F (36.6 C) (12/21 1132) Temp Source: Oral (12/21 1132) BP: 113/73 (12/21 1132) Pulse Rate: 70 (12/21 1132)  Labs: Recent Labs    06/18/20 0311 06/18/20 0510 06/19/20 0339  HGB 13.5  --  13.4  HCT 44.5  --  43.4  PLT 78*  --  175  CREATININE  --  1.31* 1.22  TROPONINIHS 17 20*  --     Estimated Creatinine Clearance: 75 mL/min (by C-G formula based on SCr of 1.22 mg/dL).   Medical History: Past Medical History:  Diagnosis Date  . Acute asthmatic bronchitis   . Anemia   . Anxiety disorder   . Borderline diabetes mellitus   . Borderline hypertension   . Degenerative joint disease   . Depression   . GERD (gastroesophageal reflux disease)   . History of adenomatous polyp of colon   . History of sinusitis   . Posttraumatic stress disorder   . Sleep apnea    Uses CPAP    Assessment: 74 yr old man admitted on 06/18/20 with hypertensive crisis. Today, Dopplers were positive for age-indeterminate DVTs in both R and L legs. Pharmacy is consulted to dose Xarelto for DVT treatment; pt was no on anticoagulants PTA. He rec'd Lovenox 40 mg SQ earlier today ~10 AM.  H/H, platelets WNL; Scr 1.22, TBW CrCl ~96 ml/min (renal function relatively stable)  Goal of Therapy:  Treatment of DVTs Monitor platelets by anticoagulation protocol: Yes   Plan:  Xarelto 15 mg PO BID X 21 days, followed by Xarelto 20 mg PO daily thereafter Monitor CBC Monitor for signs/symptoms of bleeding Provide patient education on [place], PharmD, BCPS, [facility] Clinical Pharmacist 06/20/2020,3:53 PM

## 2020-06-20 NOTE — Progress Notes (Signed)
Physical Therapy Treatment Patient Details Name: Shane [name] MRN: [id number] DOB: Mar 13, 1946 Today's Date: 06/20/2020    History of Present Illness DEMARRIUS [name] is a 74 y.o. male with medical history significant of OSA; asthma; PTSD/depression/anxiety; HTN; DVT; and DM presenting with chest pain. Pt also found to have hypertensive crisis, poor hygiene, and LE edema/scaling.    PT Comments    Pt progressing towards mobility goals; reports improved left sided upper back pain and no pain with active shoulder flexion. Ambulating 160 feet with no assistive device at a supervision level.Continues with cognitive deficits and also decreased engagement in conversations surrounding barriers related to ADL's/IADL's. Pt stating, "I have help." When asking him who his help was, pt stating, "you." Will continue to follow acutely.    Follow Up Recommendations  Home health PT (pt declining SNF)     Equipment Recommendations  None recommended by PT    Recommendations for Other Services       Precautions / Restrictions Precautions Precautions: Fall Restrictions Weight Bearing Restrictions: No    Mobility  Bed Mobility Overal bed mobility: Modified Independent                Transfers Overall transfer level: Independent Equipment used: None                Ambulation/Gait Ambulation/Gait assistance: Supervision Gait Distance (Feet): 160 Feet Assistive device: None Gait Pattern/deviations: Step-through pattern;Decreased stride length;Wide base of support Gait velocity: decreased   General Gait Details: Supervision for safety, no gross instability noted   Stairs             Wheelchair Mobility    Modified Rankin (Stroke Patients Only)       Balance Overall balance assessment: Needs assistance Sitting-balance support: Feet supported Sitting balance-Leahy Scale: Good     Standing balance support: No upper extremity supported;During functional  activity Standing balance-Leahy Scale: Good                              Cognition Arousal/Alertness: Awake/alert Behavior During Therapy: Flat affect Overall Cognitive Status: Impaired/Different from baseline Area of Impairment: Safety/judgement;Problem solving                         Safety/Judgement: Decreased awareness of deficits   Problem Solving: Decreased initiation General Comments: Pt at times does not respond to questions and stays silent. Significantly increased time for initiation of tasks, requiring repetition.      Exercises      General Comments        Pertinent Vitals/Pain Pain Assessment: Faces Faces Pain Scale: No hurt    Home Living                      Prior Function            PT Goals (current goals can now be found in the care plan section) Acute Rehab PT Goals Patient Stated Goal: less pain PT Goal Formulation: With patient Time For Goal Achievement: 07/02/20 Potential to Achieve Goals: Good Progress towards PT goals: Progressing toward goals    Frequency    Min 3X/week      PT Plan Discharge plan needs to be updated    Co-evaluation              AM-PAC PT "6 Clicks" Mobility   Outcome Measure  Help needed turning from your  back to your side while in a flat bed without using bedrails?: None Help needed moving from lying on your back to sitting on the side of a flat bed without using bedrails?: None Help needed moving to and from a bed to a chair (including a wheelchair)?: A Little Help needed standing up from a chair using your arms (e.g., wheelchair or bedside chair)?: A Little Help needed to walk in hospital room?: A Little Help needed climbing 3-5 steps with a railing? : A Lot 6 Click Score: 19    End of Session   Activity Tolerance: Patient tolerated treatment well Patient left: with call bell/phone within reach;Other (comment) (seated EOB) Nurse Communication: Mobility status PT  Visit Diagnosis: Unsteadiness on feet (R26.81);Pain Pain - part of body:  (back)     Time: 1202-1225 PT Time Calculation (min) (ACUTE ONLY): 23 min  Charges:  $Therapeutic Activity: 23-37 mins                     Wyona Almas, PT, DPT Acute Rehabilitation Services Pager [phone] Office [phone]    Deno Etienne 06/20/2020, 1:54 PM

## 2020-06-20 NOTE — Plan of Care (Signed)

## 2020-06-20 NOTE — TOC Progression Note (Signed)
Transition of Care [facility]) - Progression Note    Patient Details  Name: RUSTON FEDORA MRN: [id number] Date of Birth: 01-21-1946  [facility] CM/SW Contact  Zenon Mayo, RN Phone Number: 06/20/2020, 6:03 PM  Clinical Narrative:    CSW informed NCM that patient declined SNF and also declined [place] services.    Expected Discharge Plan: Home/Self Care Barriers to Discharge: Continued Medical Work up  Expected Discharge Plan and Services Expected Discharge Plan: Home/Self Care       Living arrangements for the past 2 months: Single Family Home                                       Social Determinants of Health (SDOH) Interventions    Readmission Risk Interventions No flowsheet data found.

## 2020-06-20 NOTE — CV Procedure (Signed)
BLE venous duplex completed  Abnormal findings reports to Antonietta Jewel, RN at [phone] by Rogelia Rohrer, RVT/RDMS  Results can be found under chart review under CV PROC. 06/20/2020 1:20 PM Gadge Hermiz RVT, RDMS

## 2020-06-20 NOTE — Progress Notes (Signed)
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

## 2020-06-20 NOTE — Progress Notes (Addendum)
PROGRESS NOTE    JACORIUS PAGET  [id number] DOB: 06/29/1946 DOA: 06/18/2020 PCP: [name], MD  Brief Narrative: 74 year old male with history of sleep apnea, asthma, depression, anxiety, PTSD, diabetes mellitus presented to the ED with left shoulder, mid/left scapular pain, he denied any shortness of breath -In the ED he was found to be significantly hypertensive with blood pressures in the 200s, EKG without acute findings and CTA chest was negative for PE or any acute findings   Assessment & Plan:     Hypertensive crisis -Blood pressures in the 200s on 12/19 on admission, amlodipine continued -Losartan added yesterday morning, overnight became hypotensive with blood pressures in the 80s requiring fluid bolus -Blood pressures in the 95-105 range today -Stop losartan and amlodipine -Monitor off antihypertensives today -PT OT eval completed, patient declines rehab  Left shoulder/intrascapular pain -CTA chest negative for PE or dissection -No skin findings concerning for shingles -Suspect this is musculoskeletal -2D echo poor technical quality but noted normal EF, severe LVH and diastolic dysfunction, no pericarditis -Continue Robaxin as needed and Vicodin  Lower extremity edema with significant lichenification/scaling Poor hygiene -No obvious ulcers -Also has extremely long and curved toenails, called and requested assistance from [facility], per Dr. Posey Pronto due to patient's huge curved toenails he would need large clippers, they will set him up for follow-up in the office early next week -PCP had ordered dopplers recently will complete this inpatient  Mild memory and cognitive deficits -B12 in the low normal range, start replacement -TSH is elevated at 9.9, check free T4  Moderate persistent asthma -Continue Breo and prn albuterol  Stage IIIa CKD -Stable  Obesity -Needs diet, lifestyle modification  DVT prophylaxis: Lovenox Code  Status: DNR Family Communication: Discussed with patient in detail, no family at bedside Disposition Plan:  Status is: Inpatient Inpatient appropriate due to severity of illness  Dispo: The patient is from: Home              Anticipated d/c is to: Home              Anticipated d/c date is: 1 day if blood pressure is stable              Patient currently is not medically stable to d/c.   Consultants: Discussed with podiatry Dr. Posey Pronto   Procedures:   Antimicrobials:    Subjective: -Overnight blood pressures dropped into the 80s, required fluid bolus  Objective: Vitals:   06/20/20 0138 06/20/20 0300 06/20/20 0841 06/20/20 1132  BP: 108/67 98/65  113/73  Pulse:  78  70  Resp:  20  18  Temp:  98.2 F (36.8 C)  97.8 F (36.6 C)  TempSrc:  Oral  Oral  SpO2:   98%   Weight:  130.2 kg    Height:        Intake/Output Summary (Last 24 hours) at 06/20/2020 1407 Last data filed at 06/20/2020 0133 Gross per 24 hour  Intake 740 ml  Output --  Net 740 ml   Filed Weights   06/18/20 1646 06/19/20 0412 06/20/20 0300  Weight: 128.7 kg 127.7 kg 130.2 kg    Examination:  General exam: Elderly pleasant male sitting up in bed, awake alert oriented to self and place, mild cognitive deficits noted CVS: S1-S2, regular rate rhythm Lungs: Clear bilaterally Abdomen: Soft, nontender, bowel sounds present Extremities: Extremely lichenified skin with diffuse scaling both lower legs near feet, no obvious ulcers, large curved toenails noted Skin:  As above Psychiatry: Appropriate mood, flat affect    Data Reviewed:   CBC: Recent Labs  Lab 06/18/20 0311 06/19/20 0339  WBC 6.2 7.1  HGB 13.5 13.4  HCT 44.5 43.4  MCV 64.7* 63.4*  PLT 78* 0000000   Basic Metabolic Panel: Recent Labs  Lab 06/18/20 0510 06/19/20 0339  NA 138 136  K 4.0 3.6  CL 98 100  CO2 25 25  GLUCOSE 116* 107*  BUN 13 12  CREATININE 1.31* 1.22  CALCIUM 9.6 9.1   GFR: Estimated Creatinine Clearance: 75  mL/min (by C-G formula based on SCr of 1.22 mg/dL). Liver Function Tests: No results for input(s): AST, ALT, ALKPHOS, BILITOT, PROT, ALBUMIN in the last 168 hours. No results for input(s): LIPASE, AMYLASE in the last 168 hours. No results for input(s): AMMONIA in the last 168 hours. Coagulation Profile: No results for input(s): INR, PROTIME in the last 168 hours. Cardiac Enzymes: No results for input(s): CKTOTAL, CKMB, CKMBINDEX, TROPONINI in the last 168 hours. BNP (last 3 results) No results for input(s): PROBNP in the last 8760 hours. HbA1C: Recent Labs    06/18/20 1108  HGBA1C 5.8*   CBG: Recent Labs  Lab 06/19/20 1153 06/19/20 1624 06/19/20 2227 06/20/20 0801 06/20/20 1131  GLUCAP 113* 101* 89 97 90   Lipid Profile: No results for input(s): CHOL, HDL, LDLCALC, TRIG, CHOLHDL, LDLDIRECT in the last 72 hours. Thyroid Function Tests: Recent Labs    06/19/20 1240 06/20/20 0823  TSH 9.930*  --   FREET4  --  0.79   Anemia Panel: Recent Labs    06/19/20 1240  VITAMINB12 215   Urine analysis:    Component Value Date/Time   COLORURINE YELLOW 12/27/2019 1136   APPEARANCEUR CLEAR 12/27/2019 1136   LABSPEC 1.017 12/27/2019 1136   PHURINE 5.0 12/27/2019 1136   GLUCOSEU NEGATIVE 12/27/2019 1136   HGBUR NEGATIVE 12/27/2019 1136   BILIRUBINUR NEGATIVE 12/27/2019 1136   [place] 12/27/2019 1136   PROTEINUR NEGATIVE 12/27/2019 1136   NITRITE NEGATIVE 12/27/2019 1136   LEUKOCYTESUR NEGATIVE 12/27/2019 1136   Sepsis Labs: @LABRCNTIP (procalcitonin:4,lacticidven:4)  ) Recent Results (from the past 240 hour(s))  Resp Panel by RT-PCR (Flu A&B, Covid) Nasopharyngeal Swab     Status: None   Collection Time: 06/18/20  8:17 AM   Specimen: Nasopharyngeal Swab; Nasopharyngeal(NP) swabs in vial transport medium  Result Value Ref Range Status   SARS Coronavirus 2 by RT PCR NEGATIVE NEGATIVE Final    Comment: (NOTE) SARS-CoV-2 target nucleic acids are NOT  DETECTED.  The SARS-CoV-2 RNA is generally detectable in upper respiratory specimens during the acute phase of infection. The lowest concentration of SARS-CoV-2 viral copies this assay can detect is 138 copies/mL. A negative result does not preclude SARS-Cov-2 infection and should not be used as the sole basis for treatment or other patient management decisions. A negative result may occur with  improper specimen collection/handling, submission of specimen other than nasopharyngeal swab, presence of viral mutation(s) within the areas targeted by this assay, and inadequate number of viral copies(<138 copies/mL). A negative result must be combined with clinical observations, patient history, and epidemiological information. The expected result is Negative.  Fact Sheet for Patients:  EntrepreneurPulse.com.au  Fact Sheet for Healthcare Providers:  IncredibleEmployment.be  This test is no t yet approved or cleared by the [place] FDA and  has been authorized for detection and/or diagnosis of SARS-CoV-2 by FDA under an Emergency Use Authorization (EUA). This EUA will remain  in effect (meaning  this test can be used) for the duration of the COVID-19 declaration under Section 564(b)(1) of the Act, 21 U.S.C.section 360bbb-3(b)(1), unless the authorization is terminated  or revoked sooner.       Influenza A by PCR NEGATIVE NEGATIVE Final   Influenza B by PCR NEGATIVE NEGATIVE Final    Comment: (NOTE) The Xpert Xpress SARS-CoV-2/FLU/RSV plus assay is intended as an aid in the diagnosis of influenza from Nasopharyngeal swab specimens and should not be used as a sole basis for treatment. Nasal washings and aspirates are unacceptable for Xpert Xpress SARS-CoV-2/FLU/RSV testing.  Fact Sheet for Patients: EntrepreneurPulse.com.au  Fact Sheet for Healthcare Providers: IncredibleEmployment.be  This test is not yet  approved or cleared by the [place] FDA and has been authorized for detection and/or diagnosis of SARS-CoV-2 by FDA under an Emergency Use Authorization (EUA). This EUA will remain in effect (meaning this test can be used) for the duration of the COVID-19 declaration under Section 564(b)(1) of the Act, 21 U.S.C. section 360bbb-3(b)(1), unless the authorization is terminated or revoked.  Performed at [facility], [place] [address], [place], [place] [address]          Radiology Studies: ECHOCARDIOGRAM COMPLETE  Result Date: 06/19/2020    ECHOCARDIOGRAM REPORT   Patient Name:   SARTHAK TONN Date of Exam: 06/19/2020 Medical Rec #:  [id number]          Height:       74.0 in Accession #:    [id number]         Weight:       281.6 lb Date of Birth:  May 16, 1946          BSA:          2.514 m Patient Age:    36 years           BP:           103/62 mmHg Patient Gender: M                  HR:           76 bpm. Exam Location:  Inpatient Procedure: 2D Echo, Cardiac Doppler and Color Doppler Indications:    Dyspnea  History:        Patient has prior history of Echocardiogram examinations, most                 recent 12/25/2019. Risk Factors:Sleep Apnea and Diabetes. GERD.                 CKD.  Sonographer:    Clayton Lefort RDCS (AE) Referring Phys: [place]  Sonographer Comments: Patient is morbidly obese and suboptimal subcostal window. Image acquisition challenging due to patient body habitus and Image acquisition challenging due to respiratory motion. No IV access for Definity use. IMPRESSIONS  1. Technically difficult study with limited echo windows and no IV access for contrast  2. Left ventricular ejection fraction, by estimation, is 65 to 70%. The left ventricle has normal function. The left ventricle has no regional wall motion abnormalities. There is severe left ventricular hypertrophy. Left ventricular diastolic parameters  are consistent with Grade I diastolic dysfunction  (impaired relaxation).  3. Right ventricular systolic function is low normal. The right ventricular size is normal. Tricuspid regurgitation signal is inadequate for assessing PA pressure.  4. The mitral valve is grossly normal. Trivial mitral valve regurgitation.  5. The aortic valve is tricuspid. Aortic valve regurgitation is not visualized.  6. Aortic dilatation noted. There is borderline dilatation of the aortic root, measuring 38 mm. Comparison(s): Prior images reviewed side by side. Changes from prior study are noted. 12/25/19: LVEF 65-70%, moderate assymetric basal septal hypertrophy, normal RV function. FINDINGS  Left Ventricle: Left ventricular ejection fraction, by estimation, is 65 to 70%. The left ventricle has normal function. The left ventricle has no regional wall motion abnormalities. The left ventricular internal cavity size was small. There is severe left ventricular hypertrophy. Left ventricular diastolic parameters are consistent with Grade I diastolic dysfunction (impaired relaxation). Indeterminate filling pressures. Right Ventricle: The right ventricular size is normal. Right vetricular wall thickness was not well visualized. Right ventricular systolic function is low normal. Tricuspid regurgitation signal is inadequate for assessing PA pressure. Left Atrium: Left atrial size was normal in size. Right Atrium: Right atrial size was normal in size. Pericardium: There is no evidence of pericardial effusion. Mitral Valve: The mitral valve is grossly normal. Trivial mitral valve regurgitation. MV peak gradient, 2.2 mmHg. The mean mitral valve gradient is 1.0 mmHg. Tricuspid Valve: The tricuspid valve is grossly normal. Tricuspid valve regurgitation is trivial. Aortic Valve: The aortic valve is tricuspid. Aortic valve regurgitation is not visualized. Aortic valve mean gradient measures 7.0 mmHg. Aortic valve peak gradient measures 14.6 mmHg. Aortic valve area, by VTI measures 2.32 cm. Pulmonic Valve:  The pulmonic valve was grossly normal. Pulmonic valve regurgitation is trivial. Aorta: Aortic dilatation noted. There is borderline dilatation of the aortic root, measuring 38 mm. IAS/Shunts: No atrial level shunt detected by color flow Doppler.  LEFT VENTRICLE PLAX 2D LVIDd:         2.90 cm  Diastology LVIDs:         2.30 cm  LV e' medial:    8.48 cm/s LV PW:         2.10 cm  LV E/e' medial:  7.3 LV IVS:        1.90 cm  LV e' lateral:   10.10 cm/s LVOT diam:     2.00 cm  LV E/e' lateral: 6.1 LV SV:         66 LV SV Index:   26 LVOT Area:     3.14 cm  RIGHT VENTRICLE RV Basal diam:  3.00 cm RV S prime:     9.38 cm/s TAPSE (M-mode): 1.5 cm LEFT ATRIUM           Index       RIGHT ATRIUM           Index LA diam:      3.20 cm 1.27 cm/m  RA Area:     14.60 cm LA Vol (A2C): 32.0 ml 12.73 ml/m RA Volume:   27.60 ml  10.98 ml/m LA Vol (A4C): 42.2 ml 16.79 ml/m  AORTIC VALVE AV Area (Vmax):    2.76 cm AV Area (Vmean):   2.56 cm AV Area (VTI):     2.32 cm AV Vmax:           191.33 cm/s AV Vmean:          118.333 cm/s AV VTI:            0.283 m AV Peak Grad:      14.6 mmHg AV Mean Grad:      7.0 mmHg LVOT Vmax:         168.00 cm/s LVOT Vmean:        96.300 cm/s LVOT VTI:  0.209 m LVOT/AV VTI ratio: 0.74  AORTA Ao Root diam: 3.80 cm Ao Asc diam:  3.60 cm MITRAL VALVE MV Area (PHT): 2.16 cm    SHUNTS MV Peak grad:  2.2 mmHg    Systemic VTI:  0.21 m MV Mean grad:  1.0 mmHg    Systemic Diam: 2.00 cm MV Vmax:       0.74 m/s MV Vmean:      44.8 cm/s MV Decel Time: 352 msec MV E velocity: 62.10 cm/s MV A velocity: 83.10 cm/s MV E/A ratio:  0.75 Lyman Bishop MD Electronically signed by Lyman Bishop MD Signature Date/Time: 06/19/2020/5:48:03 PM    Final    VAS [place] LOWER EXTREMITY VENOUS (DVT)  Result Date: 06/20/2020  Lower Venous DVT Study Indications: Edema, and Pain. 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 Comparison Study: Prior  study available from 02/16/19. Performing Technologist: Rogelia Rohrer  Examination Guidelines: A complete evaluation includes B-mode imaging, spectral Doppler, color Doppler, and power Doppler as needed of all accessible portions of each vessel. Bilateral testing is considered an integral part of a complete examination. Limited examinations for reoccurring indications may be performed as noted. The reflux portion of the exam is performed with the patient in reverse Trendelenburg.  +---------+---------------+---------+-----------+----------+-----------------+ RIGHT    CompressibilityPhasicitySpontaneityPropertiesThrombus Aging    +---------+---------------+---------+-----------+----------+-----------------+ CFV      Full           Yes      Yes                                    +---------+---------------+---------+-----------+----------+-----------------+ SFJ      Full                                                           +---------+---------------+---------+-----------+----------+-----------------+ FV Prox  Full           Yes      Yes                                    +---------+---------------+---------+-----------+----------+-----------------+ FV Mid   None           No       No                   Age Indeterminate +---------+---------------+---------+-----------+----------+-----------------+ FV DistalPartial        Yes      Yes                  Age Indeterminate +---------+---------------+---------+-----------+----------+-----------------+ PFV      Full                                                           +---------+---------------+---------+-----------+----------+-----------------+ POP      Partial        No  No                   Age Indeterminate +---------+---------------+---------+-----------+----------+-----------------+ PTV      None           No       No                   Age Indeterminate  +---------+---------------+---------+-----------+----------+-----------------+ PERO     None           No       No                   Age Indeterminate +---------+---------------+---------+-----------+----------+-----------------+   Right Technical Findings: Patient previously diagnosed with DVT in BLE - unsure of proper medication usage as patient is poor historian.  +---------+---------------+---------+-----------+----------+-----------------+ LEFT     CompressibilityPhasicitySpontaneityPropertiesThrombus Aging    +---------+---------------+---------+-----------+----------+-----------------+ CFV      Full           Yes      Yes                                    +---------+---------------+---------+-----------+----------+-----------------+ SFJ      Full                                                           +---------+---------------+---------+-----------+----------+-----------------+ FV Prox  Full           Yes      Yes                                    +---------+---------------+---------+-----------+----------+-----------------+ FV Mid   None           No       No                   Age Indeterminate +---------+---------------+---------+-----------+----------+-----------------+ FV DistalNone           No       No                   Age Indeterminate +---------+---------------+---------+-----------+----------+-----------------+ PFV      Full                                                           +---------+---------------+---------+-----------+----------+-----------------+ POP      Partial        Yes      Yes                  Age Indeterminate +---------+---------------+---------+-----------+----------+-----------------+ PTV      Full                                                           +---------+---------------+---------+-----------+----------+-----------------+ Left peroneal vein not imaged due to bandaged leg.  Only able to  visualize proximal PTV.  Left Technical Findings: Patient previously diagnosed with DVT in BLE - unsure of proper medication usage as patient is poor historian.   Summary: RIGHT: - Findings consistent with age indeterminate deep vein thrombosis involving the Mid and Distal FV, right popliteal vein, right posterior tibial veins, and right peroneal veins.  LEFT: - Findings consistent with age indeterminate deep vein thrombosis involving the Mid and Dist FV, and left popliteal vein.  *See table(s) above for measurements and observations.    Preliminary    Scheduled Meds: . docusate sodium  100 mg Oral BID  . enoxaparin (LOVENOX) injection  40 mg Subcutaneous Q24H  . fluticasone furoate-vilanterol  1 puff Inhalation Daily  . hydrocerin  1 application Topical Once per day on Sun Tue Fri  . insulin aspart  0-15 Units Subcutaneous TID WC  . montelukast  10 mg Oral QHS  . sodium chloride flush  3 mL Intravenous Q12H  . triamcinolone  1 spray Nasal Daily   Continuous Infusions: . methocarbamol (ROBAXIN) IV       LOS: 1 day    Time spent: 28min  Domenic Polite, MD [facility] 06/20/2020, 2:07 PM

## 2020-06-20 NOTE — Progress Notes (Signed)
0032: BP 88/61.  Denies c/o dizziness and ambulates in room without difficulty.  Dr. Hal Hope notified of decreasing BP.  Order received for 500 mL bolus of NS. 0138: BP improved to 108/67 following NS bolus.  Will continue to monitor.  Jodell Cipro

## 2020-06-21 ENCOUNTER — Encounter (HOSPITAL_COMMUNITY): Payer: PPO

## 2020-06-21 ENCOUNTER — Other Ambulatory Visit (HOSPITAL_COMMUNITY): Payer: Self-pay | Admitting: Family Medicine

## 2020-06-21 LAB — CBC
HCT: 37.7 % — ABNORMAL LOW (ref 39.0–52.0)
Hemoglobin: 11.7 g/dL — ABNORMAL LOW (ref 13.0–17.0)
MCH: 19.6 pg — ABNORMAL LOW (ref 26.0–34.0)
MCHC: 31 g/dL (ref 30.0–36.0)
MCV: 63.3 fL — ABNORMAL LOW (ref 80.0–100.0)
Platelets: 153 10*3/uL (ref 150–400)
RBC: 5.96 MIL/uL — ABNORMAL HIGH (ref 4.22–5.81)
RDW: 16.1 % — ABNORMAL HIGH (ref 11.5–15.5)
WBC: 6.5 10*3/uL (ref 4.0–10.5)
nRBC: 0 % (ref 0.0–0.2)

## 2020-06-21 LAB — BASIC METABOLIC PANEL
Anion gap: 9 (ref 5–15)
BUN: 27 mg/dL — ABNORMAL HIGH (ref 8–23)
CO2: 25 mmol/L (ref 22–32)
Calcium: 8.4 mg/dL — ABNORMAL LOW (ref 8.9–10.3)
Chloride: 104 mmol/L (ref 98–111)
Creatinine, Ser: 1.69 mg/dL — ABNORMAL HIGH (ref 0.61–1.24)
GFR, Estimated: 42 mL/min — ABNORMAL LOW (ref >60)
Glucose, Bld: 162 mg/dL — ABNORMAL HIGH (ref 70–99)
Potassium: 3.3 mmol/L — ABNORMAL LOW (ref 3.5–5.1)
Sodium: 138 mmol/L (ref 135–145)

## 2020-06-21 LAB — GLUCOSE, CAPILLARY
Glucose-Capillary: 88 mg/dL (ref 70–99)
Glucose-Capillary: 127 mg/dL — ABNORMAL HIGH (ref 70–99)

## 2020-06-21 MED ORDER — POTASSIUM CHLORIDE CRYS ER 20 MEQ PO TBCR
40.0000 meq | EXTENDED_RELEASE_TABLET | Freq: Once | ORAL | Status: AC
  Administered 2020-06-21: 10:00:00 40 meq via ORAL
  Filled 2020-06-21: qty 2

## 2020-06-21 MED ORDER — DEXMEDETOMIDINE HCL IN NACL 400 MCG/100ML IV SOLN
0.4000 ug/kg/h | INTRAVENOUS | Status: DC
  Filled 2020-06-21: qty 100

## 2020-06-21 MED ORDER — ELIQUIS DVT/PE STARTER PACK 5 MG PO TBPK
ORAL_TABLET | ORAL | 0 refills | Status: DC
Start: 2020-06-21 — End: 2020-09-09

## 2020-06-21 MED FILL — ELIQUIS STARTER PACK 5 MG T: 5 | 30 days supply | Qty: 74 | Fill #0

## 2020-06-21 NOTE — Progress Notes (Signed)
Reported potassium level to on coming nurse.

## 2020-06-21 NOTE — Progress Notes (Signed)
Paged Dr. Ninetta Lights [phone] related potassium level this AM 3.3 was 3.6. Awaiting call back.

## 2020-06-21 NOTE — TOC Transition Note (Signed)
Transition of Care [facility]) - CM/SW Discharge Note   Patient Details  Name: Shane [name] MRN: [id number] Date of Birth: Mar 26, 1946  [facility] Care [facility] CM/SW Contact:  Bethena Roys, RN Phone Number: 06/21/2020, 1:35 PM   Clinical Narrative: Case Manager spoke with patient regarding transition of care plans. Patient still declines SNF and [facility]. Patient states that he is not home bound and will not be home when therapy comes for visits. He asked if he could drive to outpatient physical therapy. Case Manager discussed locations for outpatient physical therapy and the patient was agreeable with the [facility]. Location.  Ambulatory referral submitted via Epic. Pt is aware that the office will call him with a visit time. Case Manager made MD aware of the plan of care. Plan will be for patient to transition home today.    Final next level of care: Home/Self Care Barriers to Discharge: No Barriers Identified   Patient Goals and CMS Choice Patient states their goals for this hospitalization and ongoing recovery are:: to return home CMS Medicare.gov Compare Post Acute Care list provided to:: Patient Choice offered to / list presented to : NA  Discharge Plan and Services In-house Referral: NA Discharge Planning Services: CM Consult            DME Arranged: N/A DME Agency: NA       HH Arranged: Refused SNF,Patient Refused HH     Readmission Risk Interventions No flowsheet data found.

## 2020-06-21 NOTE — Discharge Instructions (Addendum)
Apixaban oral tablets What is this medicine? APIXABAN (a PIX a ban) is an anticoagulant (blood thinner). It is used to lower the chance of stroke in people with a medical condition called atrial fibrillation. It is also used to treat or prevent blood clots in the lungs or in the veins. This medicine may be used for other purposes; ask your health care provider or pharmacist if you have questions. COMMON BRAND NAME(S): Eliquis What should I tell my health care provider before I take this medicine? They need to know if you have any of these conditions:  antiphospholipid antibody syndrome  bleeding disorders  bleeding in the brain  blood in your stools (black or tarry stools) or if you have blood in your vomit  history of blood clots  history of stomach bleeding  kidney disease  liver disease  mechanical heart valve  an unusual or allergic reaction to apixaban, other medicines, foods, dyes, or preservatives  pregnant or trying to get pregnant  breast-feeding How should I use this medicine? Take this medicine by mouth with a glass of water. Follow the directions on the prescription label. You can take it with or without food. If it upsets your stomach, take it with food. Take your medicine at regular intervals. Do not take it more often than directed. Do not stop taking except on your doctor's advice. Stopping this medicine may increase your risk of a blood clot. Be sure to refill your prescription before you run out of medicine. Talk to your pediatrician regarding the use of this medicine in children. Special care may be needed. Overdosage: If you think you have taken too much of this medicine contact a poison control center or emergency room at once. NOTE: This medicine is only for you. Do not share this medicine with others. What if I miss a dose? If you miss a dose, take it as soon as you can. If it is almost time for your next dose, take only that dose. Do not take double or  extra doses. What may interact with this medicine? This medicine may interact with the following:  aspirin and aspirin-like medicines  certain medicines for fungal infections like ketoconazole and itraconazole  certain medicines for seizures like carbamazepine and phenytoin  certain medicines that treat or prevent blood clots like warfarin, enoxaparin, and dalteparin  clarithromycin  NSAIDs, medicines for pain and inflammation, like ibuprofen or naproxen  rifampin  ritonavir  St. John's wort This list may not describe all possible interactions. Give your health care provider a list of all the medicines, herbs, non-prescription drugs, or dietary supplements you use. Also tell them if you smoke, drink alcohol, or use illegal drugs. Some items may interact with your medicine. What should I watch for while using this medicine? Visit your healthcare professional for regular checks on your progress. You may need blood work done while you are taking this medicine. Your condition will be monitored carefully while you are receiving this medicine. It is important not to miss any appointments. Avoid sports and activities that might cause injury while you are using this medicine. Severe falls or injuries can cause unseen bleeding. Be careful when using sharp tools or knives. Consider using an electric razor. Take special care brushing or flossing your teeth. Report any injuries, bruising, or red spots on the skin to your healthcare professional. If you are going to need surgery or other procedure, tell your healthcare professional that you are taking this medicine. Wear a medical ID bracelet   or chain. Carry a card that describes your disease and details of your medicine and dosage times. What side effects may I notice from receiving this medicine? Side effects that you should report to your doctor or health care professional as soon as possible:  allergic reactions like skin rash, itching or hives,  swelling of the face, lips, or tongue  signs and symptoms of bleeding such as bloody or black, tarry stools; red or dark-brown urine; spitting up blood or brown material that looks like coffee grounds; red spots on the skin; unusual bruising or bleeding from the eye, gums, or nose  signs and symptoms of a blood clot such as chest pain; shortness of breath; pain, swelling, or warmth in the leg  signs and symptoms of a stroke such as changes in vision; confusion; trouble speaking or understanding; severe headaches; sudden numbness or weakness of the face, arm or leg; trouble walking; dizziness; loss of coordination This list may not describe all possible side effects. Call your doctor for medical advice about side effects. You may report side effects to FDA at 1-800-FDA-1088. Where should I keep my medicine? Keep out of the reach of children. Store at room temperature between 20 and 25 degrees C (68 and 77 degrees F). Throw away any unused medicine after the expiration date. NOTE: This sheet is a summary. It may not cover all possible information. If you have questions about this medicine, talk to your doctor, pharmacist, or health care provider.  2020 [employer]/Gold Standard (2018-02-25 17:39:34)  

## 2020-06-21 NOTE — Progress Notes (Signed)
Physician Discharge Summary  Shane [name] [id number] DOB: 1946-05-08 DOA: 06/18/2020  PCP: [name], MD  Admit date: 06/18/2020 Discharge date: 06/21/2020  Admitted From: Home Disposition: Home  Recommendations for Outpatient Follow-up:  1. Follow up with PCP in 1-2 weeks 2. Please obtain BMP/CBC in one week 3. Please follow up with your PCP on the following pending results: Unresulted Labs (From admission, onward)          Start     Ordered   06/21/20 0500  CBC  Daily,   R      06/20/20 [place]: None Equipment/Devices: None  Discharge Condition: Stable but deconditioned CODE STATUS: DNR Diet recommendation: Cardiac  Subjective: Patient seen and examined.  Still complains of shoulder pain.  No other complaint.  Denied any chest pain, shortness of breath, palpitation, headache or any other complaint.  Brief/Interim Summary: 74 year old male with history of sleep apnea, asthma, depression, anxiety, PTSD, diabetes mellitus presented to the ED with left shoulder, mid/left scapular pain, he denied any shortness of breath-In the ED he was found to be significantly hypertensive with blood pressures in the 200s, EKG without acute findings and CTA chest was negative for PE or any acute findings.  He was admitted to hospitalist service for hypertensive urgency.  He was started on losartan but after that, he dropped his blood pressure requiring fluid boluses.  Antihypertensives were held.  Due to lower extremity edema, Doppler lower extremity was done which showed DVT for which he was started on Xarelto.  He was seen by PT OT due to weakness in the recommended skilled nursing facility which patient declined.  We then offered patient home health but patient declined that as well.  Patient is medically stable for discharge so he is going to be discharged to home and outpatient PT has been ordered for him.  He is being switched from Xarelto to oral  Eliquis.  Medications prescription has been sent to [facility] [place].  It is to be noted that patient ideally should go to SNF due to weakness but he has declined so for that reason, he remains high risk for further deconditioning and its complications which may include fall.  I personally discussed possible consequences with the patient and try to convince him however despite of lengthy discussion, he remained adamant and continued to decline SNF or home health and verbalized that he will take responsibility for all the consequences.  Of note, patient's blood pressure has remained stable since last several days so I went to resume his amlodipine.  Discharge Diagnoses:  Principal Problem:   Hypertensive crisis Active Problems:   Moderate persistent asthma   Leg swelling   Thrombocytopenia (HCC)   Poor personal hygiene   Stage 3a chronic kidney disease ([place])   Diet-controlled diabetes mellitus ([place])   Class 2 obesity due to excess calories with body mass index (BMI) of 38.0 to 38.9 in adult   Back pain   Hypertensive urgency    Discharge Instructions  Discharge Instructions    Ambulatory referral to Physical Therapy   Complete by: As directed    Evaluation and Treatment.     Allergies as of 06/21/2020   No Known Allergies     Medication List    TAKE these medications   albuterol 108 (90 Base) MCG/ACT inhaler Commonly known as: VENTOLIN HFA INHALE TWO PUFFS EVERY FOUR TO SIX HOURS AS NEEDED FOR COUGH  OR WHEEZE. What changed:   how much to take  how to take this  when to take this  reasons to take this  additional instructions   alum & mag hydroxide-simeth 400-400-40 MG/5ML suspension Commonly known as: Mylanta Maximum Strength Take 5 mLs by mouth every 6 (six) hours as needed (abdominal pain or indigestion).   amLODipine 10 MG tablet Commonly known as: NORVASC Take 1 tablet (10 mg total) by mouth daily.   Breo Ellipta 200-25 MCG/INH Aepb Generic drug:  fluticasone furoate-vilanterol INHALE 1 PUFF BY MOUTH EVERY DAY What changed: See the new instructions.   Eliquis DVT/PE Starter Pack Generic drug: Apixaban Starter Pack (10mg  and 5mg ) Take as directed on package: start with two-5mg  tablets twice daily for 7 days. On day 8, switch to one-5mg  tablet twice daily.   furosemide 40 MG tablet Commonly known as: Lasix Take 1 tablet (40 mg total) by mouth daily. TAKE 1 TABLET DAILY FOR 4 DAYS.   montelukast 10 MG tablet Commonly known as: SINGULAIR TAKE 1 TABLET BY MOUTH EVERYDAY AT BEDTIME What changed: See the new instructions.   SYSTANE BALANCE OP Place 1 drop into both eyes daily as needed (dry eyes). Reported on 12/19/2015   triamcinolone 55 MCG/ACT Aero nasal inhaler Commonly known as: Nasacort Allergy 24HR Use one spray in each nostril once daily as directed.       Follow-up Information    [facility] Follow up.   Specialty: Rehabilitation Why: This location will call you with a visit time for outpatient therapy services.  Contact information: [address] [id number] mc [place]       [name], MD. Call.   Specialty: Internal Medicine Why: the office to schedule a hosptial follow up appoitment within 1-2 weeks. Contact information: [place] [address] [phone]              No Known Allergies  Consultations: None   Procedures/Studies: DG Thoracic Spine 2 View  Result Date: 06/18/2020 CLINICAL DATA:  Left shoulder pain beginning last night. EXAM: THORACIC SPINE 2 VIEWS COMPARISON:  None. FINDINGS: Multilevel degenerative changes. No acute fracture identified. No malalignment. IMPRESSION: Multilevel degenerative changes. No fracture or malalignment. Electronically Signed   By: Dorise Bullion III M.D   On: 06/18/2020 10:06   CT ANGIO CHEST PE W OR WO CONTRAST  Result Date:  06/18/2020 CLINICAL DATA:  Left chest and shoulder pain beginning yesterday. Personal history of DVT. High probability for pulmonary embolism. EXAM: CT ANGIOGRAPHY CHEST WITH CONTRAST TECHNIQUE: Multidetector CT imaging of the chest was performed using the standard protocol during bolus administration of intravenous contrast. Multiplanar CT image reconstructions and MIPs were obtained to evaluate the vascular anatomy. CONTRAST:  80mL OMNIPAQUE IOHEXOL 350 MG/ML SOLN COMPARISON:  12/25/2019 FINDINGS: Cardiovascular: Satisfactory opacification of pulmonary arteries noted, and no pulmonary emboli identified. No evidence of thoracic aortic dissection or aneurysm. Mediastinum/Nodes: No masses or pathologically enlarged lymph nodes identified. Lungs/Pleura: No pulmonary mass, consolidation, or effusion. Mild atelectasis seen in both lung bases which is new since previous study. Upper abdomen: Small hiatal hernia noted. Musculoskeletal: No suspicious bone lesions identified. Review of the MIP images confirms the above findings. IMPRESSION: No evidence of pulmonary embolism. Mild bibasilar atelectasis. Small hiatal hernia. Electronically Signed   By: Marlaine Hind M.D.   On: 06/18/2020 07:52   DG Chest Port 1 View  Result Date: 06/18/2020 CLINICAL DATA:  Chest pain EXAM: PORTABLE CHEST 1  VIEW COMPARISON:  12/25/2019 FINDINGS: The heart size and mediastinal contours are within normal limits. Both lungs are clear. The visualized skeletal structures are unremarkable. IMPRESSION: No active disease. Electronically Signed   By: Ulyses Jarred M.D.   On: 06/18/2020 03:34   ECHOCARDIOGRAM COMPLETE  Result Date: 06/19/2020    ECHOCARDIOGRAM REPORT   Patient Name:   Shane [name] Date of Exam: 06/19/2020 Medical Rec #:  [id number]          Height:       74.0 in Accession #:    [id number]         Weight:       281.6 lb Date of Birth:  08-03-1945          BSA:          2.514 m Patient Age:    31 years           BP:            103/62 mmHg Patient Gender: M                  HR:           76 bpm. Exam Location:  Inpatient Procedure: 2D Echo, Cardiac Doppler and Color Doppler Indications:    Dyspnea  History:        Patient has prior history of Echocardiogram examinations, most                 recent 12/25/2019. Risk Factors:Sleep Apnea and Diabetes. GERD.                 CKD.  Sonographer:    Clayton Lefort RDCS (AE) Referring Phys: [place]  Sonographer Comments: Patient is morbidly obese and suboptimal subcostal window. Image acquisition challenging due to patient body habitus and Image acquisition challenging due to respiratory motion. No IV access for Definity use. IMPRESSIONS  1. Technically difficult study with limited echo windows and no IV access for contrast  2. Left ventricular ejection fraction, by estimation, is 65 to 70%. The left ventricle has normal function. The left ventricle has no regional wall motion abnormalities. There is severe left ventricular hypertrophy. Left ventricular diastolic parameters  are consistent with Grade I diastolic dysfunction (impaired relaxation).  3. Right ventricular systolic function is low normal. The right ventricular size is normal. Tricuspid regurgitation signal is inadequate for assessing PA pressure.  4. The mitral valve is grossly normal. Trivial mitral valve regurgitation.  5. The aortic valve is tricuspid. Aortic valve regurgitation is not visualized.  6. Aortic dilatation noted. There is borderline dilatation of the aortic root, measuring 38 mm. Comparison(s): Prior images reviewed side by side. Changes from prior study are noted. 12/25/19: LVEF 65-70%, moderate assymetric basal septal hypertrophy, normal RV function. FINDINGS  Left Ventricle: Left ventricular ejection fraction, by estimation, is 65 to 70%. The left ventricle has normal function. The left ventricle has no regional wall motion abnormalities. The left ventricular internal cavity size was small. There is severe  left ventricular hypertrophy. Left ventricular diastolic parameters are consistent with Grade I diastolic dysfunction (impaired relaxation). Indeterminate filling pressures. Right Ventricle: The right ventricular size is normal. Right vetricular wall thickness was not well visualized. Right ventricular systolic function is low normal. Tricuspid regurgitation signal is inadequate for assessing PA pressure. Left Atrium: Left atrial size was normal in size. Right Atrium: Right atrial size was normal in size. Pericardium: There is no evidence of pericardial effusion. Mitral Valve: The  mitral valve is grossly normal. Trivial mitral valve regurgitation. MV peak gradient, 2.2 mmHg. The mean mitral valve gradient is 1.0 mmHg. Tricuspid Valve: The tricuspid valve is grossly normal. Tricuspid valve regurgitation is trivial. Aortic Valve: The aortic valve is tricuspid. Aortic valve regurgitation is not visualized. Aortic valve mean gradient measures 7.0 mmHg. Aortic valve peak gradient measures 14.6 mmHg. Aortic valve area, by VTI measures 2.32 cm. Pulmonic Valve: The pulmonic valve was grossly normal. Pulmonic valve regurgitation is trivial. Aorta: Aortic dilatation noted. There is borderline dilatation of the aortic root, measuring 38 mm. IAS/Shunts: No atrial level shunt detected by color flow Doppler.  LEFT VENTRICLE PLAX 2D LVIDd:         2.90 cm  Diastology LVIDs:         2.30 cm  LV e' medial:    8.48 cm/s LV PW:         2.10 cm  LV E/e' medial:  7.3 LV IVS:        1.90 cm  LV e' lateral:   10.10 cm/s LVOT diam:     2.00 cm  LV E/e' lateral: 6.1 LV SV:         66 LV SV Index:   26 LVOT Area:     3.14 cm  RIGHT VENTRICLE RV Basal diam:  3.00 cm RV S prime:     9.38 cm/s TAPSE (M-mode): 1.5 cm LEFT ATRIUM           Index       RIGHT ATRIUM           Index LA diam:      3.20 cm 1.27 cm/m  RA Area:     14.60 cm LA Vol (A2C): 32.0 ml 12.73 ml/m RA Volume:   27.60 ml  10.98 ml/m LA Vol (A4C): 42.2 ml 16.79 ml/m  AORTIC  VALVE AV Area (Vmax):    2.76 cm AV Area (Vmean):   2.56 cm AV Area (VTI):     2.32 cm AV Vmax:           191.33 cm/s AV Vmean:          118.333 cm/s AV VTI:            0.283 m AV Peak Grad:      14.6 mmHg AV Mean Grad:      7.0 mmHg LVOT Vmax:         168.00 cm/s LVOT Vmean:        96.300 cm/s LVOT VTI:          0.209 m LVOT/AV VTI ratio: 0.74  AORTA Ao Root diam: 3.80 cm Ao Asc diam:  3.60 cm MITRAL VALVE MV Area (PHT): 2.16 cm    SHUNTS MV Peak grad:  2.2 mmHg    Systemic VTI:  0.21 m MV Mean grad:  1.0 mmHg    Systemic Diam: 2.00 cm MV Vmax:       0.74 m/s MV Vmean:      44.8 cm/s MV Decel Time: 352 msec MV E velocity: 62.10 cm/s MV A velocity: 83.10 cm/s MV E/A ratio:  0.75 Lyman Bishop MD Electronically signed by Lyman Bishop MD Signature Date/Time: 06/19/2020/5:48:03 PM    Final    VAS [place] LOWER EXTREMITY VENOUS (DVT)  Result Date: 06/20/2020  Lower Venous DVT Study Indications: Edema, and Pain. Other Indications: History of DVT in BLE - possible noncompliance with  medications (patient AMS). Comparison Study: Prior study available from 02/16/19. Performing Technologist: Rogelia Rohrer  Examination Guidelines: A complete evaluation includes B-mode imaging, spectral Doppler, color Doppler, and power Doppler as needed of all accessible portions of each vessel. Bilateral testing is considered an integral part of a complete examination. Limited examinations for reoccurring indications may be performed as noted. The reflux portion of the exam is performed with the patient in reverse Trendelenburg.  +---------+---------------+---------+-----------+----------+-----------------+ RIGHT    CompressibilityPhasicitySpontaneityPropertiesThrombus Aging    +---------+---------------+---------+-----------+----------+-----------------+ CFV      Full           Yes      Yes                                    +---------+---------------+---------+-----------+----------+-----------------+ SFJ       Full                                                           +---------+---------------+---------+-----------+----------+-----------------+ FV Prox  Full           Yes      Yes                                    +---------+---------------+---------+-----------+----------+-----------------+ FV Mid   None           No       No                   Age Indeterminate +---------+---------------+---------+-----------+----------+-----------------+ FV DistalPartial        Yes      Yes                  Age Indeterminate +---------+---------------+---------+-----------+----------+-----------------+ PFV      Full                                                           +---------+---------------+---------+-----------+----------+-----------------+ POP      Partial        No       No                   Age Indeterminate +---------+---------------+---------+-----------+----------+-----------------+ PTV      None           No       No                   Age Indeterminate +---------+---------------+---------+-----------+----------+-----------------+ PERO     None           No       No                   Age Indeterminate +---------+---------------+---------+-----------+----------+-----------------+   Right Technical Findings: Patient previously diagnosed with DVT in BLE - unsure of proper medication usage as patient is poor historian.  +---------+---------------+---------+-----------+----------+-----------------+ LEFT     CompressibilityPhasicitySpontaneityPropertiesThrombus Aging    +---------+---------------+---------+-----------+----------+-----------------+ CFV      Full  Yes      Yes                                    +---------+---------------+---------+-----------+----------+-----------------+ SFJ      Full                                                            +---------+---------------+---------+-----------+----------+-----------------+ FV Prox  Full           Yes      Yes                                    +---------+---------------+---------+-----------+----------+-----------------+ FV Mid   None           No       No                   Age Indeterminate +---------+---------------+---------+-----------+----------+-----------------+ FV DistalNone           No       No                   Age Indeterminate +---------+---------------+---------+-----------+----------+-----------------+ PFV      Full                                                           +---------+---------------+---------+-----------+----------+-----------------+ POP      Partial        Yes      Yes                  Age Indeterminate +---------+---------------+---------+-----------+----------+-----------------+ PTV      Full                                                           +---------+---------------+---------+-----------+----------+-----------------+ Left peroneal vein not imaged due to bandaged leg. Only able to visualize proximal PTV.  Left Technical Findings: Patient previously diagnosed with DVT in BLE - unsure of proper medication usage as patient is poor historian.   Summary: RIGHT: - Findings consistent with age indeterminate deep vein thrombosis involving the Mid and Distal FV, right popliteal vein, right posterior tibial veins, and right peroneal veins.  LEFT: - Findings consistent with age indeterminate deep vein thrombosis involving the Mid and Dist FV, and left popliteal vein.  *See table(s) above for measurements and observations. Electronically signed by Harold Barban MD on 06/20/2020 at 6:06:30 PM.    Final       Discharge Exam: Vitals:   06/21/20 0500 06/21/20 0853  BP: 122/69   Pulse: 62   Resp: 20   Temp: 98.3 F (36.8 C)   SpO2: 99% 97%   Vitals:   06/21/20 0032 06/21/20 0200 06/21/20 0500 06/21/20 0853  BP: 122/77  120/74 122/69   Pulse: 65 62 62  Resp: 20 20 20    Temp: 98 F (36.7 C) 98 F (36.7 C) 98.3 F (36.8 C)   TempSrc: Oral Oral Oral   SpO2:  98% 99% 97%  Weight:   128.7 kg   Height:        General: Pt is alert, awake, not in acute distress Cardiovascular: RRR, S1/S2 +, no rubs, no gallops Respiratory: CTA bilaterally, no wheezing, no rhonchi Abdominal: Soft, NT, ND, bowel sounds + Extremities: +1 pitting edema bilateral lower extremity, no cyanosis    The results of significant diagnostics from this hospitalization (including imaging, microbiology, ancillary and laboratory) are listed below for reference.     Microbiology: Recent Results (from the past 240 hour(s))  Resp Panel by RT-PCR (Flu A&B, Covid) Nasopharyngeal Swab     Status: None   Collection Time: 06/18/20  8:17 AM   Specimen: Nasopharyngeal Swab; Nasopharyngeal(NP) swabs in vial transport medium  Result Value Ref Range Status   SARS Coronavirus 2 by RT PCR NEGATIVE NEGATIVE Final    Comment: (NOTE) SARS-CoV-2 target nucleic acids are NOT DETECTED.  The SARS-CoV-2 RNA is generally detectable in upper respiratory specimens during the acute phase of infection. The lowest concentration of SARS-CoV-2 viral copies this assay can detect is 138 copies/mL. A negative result does not preclude SARS-Cov-2 infection and should not be used as the sole basis for treatment or other patient management decisions. A negative result may occur with  improper specimen collection/handling, submission of specimen other than nasopharyngeal swab, presence of viral mutation(s) within the areas targeted by this assay, and inadequate number of viral copies(<138 copies/mL). A negative result must be combined with clinical observations, patient history, and epidemiological information. The expected result is Negative.  Fact Sheet for Patients:  EntrepreneurPulse.com.au  Fact Sheet for Healthcare Providers:   IncredibleEmployment.be  This test is no t yet approved or cleared by the [place] FDA and  has been authorized for detection and/or diagnosis of SARS-CoV-2 by FDA under an Emergency Use Authorization (EUA). This EUA will remain  in effect (meaning this test can be used) for the duration of the COVID-19 declaration under Section 564(b)(1) of the Act, 21 U.S.C.section 360bbb-3(b)(1), unless the authorization is terminated  or revoked sooner.       Influenza A by PCR NEGATIVE NEGATIVE Final   Influenza B by PCR NEGATIVE NEGATIVE Final    Comment: (NOTE) The Xpert Xpress SARS-CoV-2/FLU/RSV plus assay is intended as an aid in the diagnosis of influenza from Nasopharyngeal swab specimens and should not be used as a sole basis for treatment. Nasal washings and aspirates are unacceptable for Xpert Xpress SARS-CoV-2/FLU/RSV testing.  Fact Sheet for Patients: EntrepreneurPulse.com.au  Fact Sheet for Healthcare Providers: IncredibleEmployment.be  This test is not yet approved or cleared by the [place] FDA and has been authorized for detection and/or diagnosis of SARS-CoV-2 by FDA under an Emergency Use Authorization (EUA). This EUA will remain in effect (meaning this test can be used) for the duration of the COVID-19 declaration under Section 564(b)(1) of the Act, 21 U.S.C. section 360bbb-3(b)(1), unless the authorization is terminated or revoked.  Performed at [facility], [place] [address], [place], [place] [address]      Labs: BNP (last 3 results) Recent Labs    12/25/19 0440 06/18/20 0311  BNP 86.0 78.9   Basic Metabolic Panel: Recent Labs  Lab 06/18/20 0510 06/19/20 0339 06/21/20 0239  NA 138 136 138  K 4.0 3.6 3.3*  CL 98 100 104  CO2 25 25 25   GLUCOSE 116* 107* 162*  BUN 13 12 27*  CREATININE 1.31* 1.22 1.69*  CALCIUM 9.6 9.1 8.4*   Liver Function Tests: No results for input(s): AST,  ALT, ALKPHOS, BILITOT, PROT, ALBUMIN in the last 168 hours. No results for input(s): LIPASE, AMYLASE in the last 168 hours. No results for input(s): AMMONIA in the last 168 hours. CBC: Recent Labs  Lab 06/18/20 0311 06/19/20 0339 06/21/20 0239  WBC 6.2 7.1 6.5  HGB 13.5 13.4 11.7*  HCT 44.5 43.4 37.7*  MCV 64.7* 63.4* 63.3*  PLT 78* 175 153   Cardiac Enzymes: No results for input(s): CKTOTAL, CKMB, CKMBINDEX, TROPONINI in the last 168 hours. BNP: Invalid input(s): POCBNP CBG: Recent Labs  Lab 06/20/20 1131 06/20/20 1635 06/20/20 2119 06/21/20 0751 06/21/20 1113  GLUCAP 90 114* 84 88 127*   D-Dimer No results for input(s): DDIMER in the last 72 hours. Hgb A1c No results for input(s): HGBA1C in the last 72 hours. Lipid Profile No results for input(s): CHOL, HDL, LDLCALC, TRIG, CHOLHDL, LDLDIRECT in the last 72 hours. Thyroid function studies Recent Labs    06/19/20 1240  TSH 9.930*   Anemia work up Recent Labs    06/19/20 1240  VITAMINB12 215   Urinalysis    Component Value Date/Time   COLORURINE YELLOW 12/27/2019 1136   APPEARANCEUR CLEAR 12/27/2019 1136   LABSPEC 1.017 12/27/2019 1136   PHURINE 5.0 12/27/2019 1136   GLUCOSEU NEGATIVE 12/27/2019 1136   HGBUR NEGATIVE 12/27/2019 1136   [place] NEGATIVE 12/27/2019 1136   KETONESUR NEGATIVE 12/27/2019 1136   PROTEINUR NEGATIVE 12/27/2019 1136   NITRITE NEGATIVE 12/27/2019 1136   LEUKOCYTESUR NEGATIVE 12/27/2019 1136   Sepsis Labs Invalid input(s): PROCALCITONIN,  WBC,  LACTICIDVEN Microbiology Recent Results (from the past 240 hour(s))  Resp Panel by RT-PCR (Flu A&B, Covid) Nasopharyngeal Swab     Status: None   Collection Time: 06/18/20  8:17 AM   Specimen: Nasopharyngeal Swab; Nasopharyngeal(NP) swabs in vial transport medium  Result Value Ref Range Status   SARS Coronavirus 2 by RT PCR NEGATIVE NEGATIVE Final    Comment: (NOTE) SARS-CoV-2 target nucleic acids are NOT DETECTED.  The  SARS-CoV-2 RNA is generally detectable in upper respiratory specimens during the acute phase of infection. The lowest concentration of SARS-CoV-2 viral copies this assay can detect is 138 copies/mL. A negative result does not preclude SARS-Cov-2 infection and should not be used as the sole basis for treatment or other patient management decisions. A negative result may occur with  improper specimen collection/handling, submission of specimen other than nasopharyngeal swab, presence of viral mutation(s) within the areas targeted by this assay, and inadequate number of viral copies(<138 copies/mL). A negative result must be combined with clinical observations, patient history, and epidemiological information. The expected result is Negative.  Fact Sheet for Patients:  EntrepreneurPulse.com.au  Fact Sheet for Healthcare Providers:  IncredibleEmployment.be  This test is no t yet approved or cleared by the [place] FDA and  has been authorized for detection and/or diagnosis of SARS-CoV-2 by FDA under an Emergency Use Authorization (EUA). This EUA will remain  in effect (meaning this test can be used) for the duration of the COVID-19 declaration under Section 564(b)(1) of the Act, 21 U.S.C.section 360bbb-3(b)(1), unless the authorization is terminated  or revoked sooner.       Influenza A by PCR NEGATIVE NEGATIVE Final   Influenza B by PCR NEGATIVE NEGATIVE Final    Comment: (NOTE) The Xpert Xpress  SARS-CoV-2/FLU/RSV plus assay is intended as an aid in the diagnosis of influenza from Nasopharyngeal swab specimens and should not be used as a sole basis for treatment. Nasal washings and aspirates are unacceptable for Xpert Xpress SARS-CoV-2/FLU/RSV testing.  Fact Sheet for Patients: EntrepreneurPulse.com.au  Fact Sheet for Healthcare Providers: IncredibleEmployment.be  This test is not yet approved or  cleared by the [place] FDA and has been authorized for detection and/or diagnosis of SARS-CoV-2 by FDA under an Emergency Use Authorization (EUA). This EUA will remain in effect (meaning this test can be used) for the duration of the COVID-19 declaration under Section 564(b)(1) of the Act, 21 U.S.C. section 360bbb-3(b)(1), unless the authorization is terminated or revoked.  Performed at [facility], [place] [address], [place], [place] [address]      Time coordinating discharge: Over 30 minutes  SIGNED:   Darliss Cheney, MD  [facility] 06/21/2020, 1:34 PM  If 7PM-7AM, please contact night-coverage [email or web address]

## 2020-06-21 NOTE — Progress Notes (Signed)
Occupational Therapy Treatment Patient Details Name: Shane [name] MRN: [id number] DOB: [birth year] Today's Date: 06/21/2020    History of present illness Shane [name] is a 74 y.o. male with medical history significant of OSA; asthma; PTSD/depression/anxiety; HTN; DVT; and DM presenting with chest pain. Pt also found to have hypertensive crisis, poor hygiene, and LE edema/scaling.   OT comments  Pt with gradual progress towards OT goals, presents supine in bed agreeable to therapy session with min encouragement. Pt currently performing room level mobility tasks at supervision level without AD, requiring minA for LB ADL at this time. Pt remains with cognitive impairments including delayed processing, initiation and overall awareness. He will benefit from further cognitive assessment in following sessions. Given cognitive impairments feel pt remains appropriate for SNF level therapies, however noted per chart pt currently refusing. If pt to refuse SNF recommend max HH services initially to maximize his overall safety and independence with ADL and mobility. Will continue to follow while acutely admitted.    Follow Up Recommendations  SNF (if refsuing SNF rec [facility] services)    Equipment Recommendations  None recommended by OT          Precautions / Restrictions Precautions Precautions: Fall Restrictions Weight Bearing Restrictions: No       Mobility Bed Mobility Overal bed mobility: Modified Independent                Transfers Overall transfer level: Needs assistance Equipment used: None Transfers: Sit to/from Stand Sit to Stand: Supervision         General transfer comment: Supervision for safety    Balance Overall balance assessment: Needs assistance Sitting-balance support: Feet supported Sitting balance-Leahy Scale: Good     Standing balance support: No upper extremity supported;During functional activity Standing balance-Leahy Scale: Good                              ADL either performed or assessed with clinical judgement   ADL Overall ADL's : Needs assistance/impaired     Grooming: Wash/dry hands;Supervision/safety;Standing               Lower Body Dressing: Minimal assistance;Sit to/from stand Lower Body Dressing Details (indicate cue type and reason): assist for initial start of R sock, donning L sock without assist; supervision for standing balance     Toileting- Clothing Manipulation and Hygiene: Supervision/safety;Sit to/from stand Toileting - Clothing Manipulation Details (indicate cue type and reason): standing to void bladder at toilet     Functional mobility during ADLs: Supervision/safety                         Cognition Arousal/Alertness: Awake/alert Behavior During Therapy: Flat affect Overall Cognitive Status: Impaired/Different from baseline Area of Impairment: Safety/judgement;Problem solving;Awareness                         Safety/Judgement: Decreased awareness of deficits Awareness: Emergent Problem Solving: Decreased initiation;Slow processing General Comments: slow to respond to questions, instructions; distractible and requires cues for redirection at times. will benefit from contnued assessment        Exercises     Shoulder Instructions       General Comments VSS    Pertinent Vitals/ Pain       Pain Assessment: No/denies pain  Home Living              Prior Functioning/Environment  Frequency  Min 2X/week        Progress Toward Goals  OT Goals(current goals can now be found in the care plan section)  Progress towards OT goals: Progressing toward goals  Acute Rehab OT Goals Patient Stated Goal: less pain OT Goal Formulation: With patient Time For Goal Achievement: 07/03/20 Potential to Achieve Goals: Good ADL Goals Pt Will Perform Grooming: with supervision;with set-up;with modified independence;standing Pt Will  Perform Lower Body Bathing: with supervision;with set-up;sit to/from stand Pt Will Perform Lower Body Dressing: with supervision;with set-up;sit to/from stand Pt Will Transfer to Toilet: with modified independence;ambulating Pt Will Perform Tub/Shower Transfer: with supervision;with modified independence;ambulating  Plan Discharge plan remains appropriate    Co-evaluation                 AM-PAC OT "6 Clicks" Daily Activity     Outcome Measure   Help from another person eating meals?: None Help from another person taking care of personal grooming?: A Little Help from another person toileting, which includes using toliet, bedpan, or urinal?: A Little Help from another person bathing (including washing, rinsing, drying)?: A Little Help from another person to put on and taking off regular upper body clothing?: None Help from another person to put on and taking off regular lower body clothing?: A Little 6 Click Score: 20    End of Session Equipment Utilized During Treatment: Gait belt  OT Visit Diagnosis: Other abnormalities of gait and mobility (R26.89);Other symptoms and signs involving cognitive function   Activity Tolerance Patient tolerated treatment well   Patient Left in bed;with call bell/phone within reach;with bed alarm set   Nurse Communication Mobility status        Time: 2094-7096 OT Time Calculation (min): 28 min  Charges: OT General Charges $OT Visit: 1 Visit OT Treatments $Self Care/Home Management : 23-37 mins  Marcy Siren, OT Acute Rehabilitation Services Pager [phone] Office [phone]   Orlando Penner 06/21/2020, 3:23 PM

## 2020-06-22 ENCOUNTER — Other Ambulatory Visit: Payer: Self-pay | Admitting: Allergy and Immunology

## 2020-06-22 ENCOUNTER — Emergency Department (HOSPITAL_COMMUNITY)
Admission: EM | Admit: 2020-06-22 | Discharge: 2020-06-23 | Disposition: A | Discharge status: Home / Self Care | Payer: PPO | Attending: Emergency Medicine | Admitting: Emergency Medicine

## 2020-06-22 ENCOUNTER — Encounter (HOSPITAL_COMMUNITY): Payer: Self-pay | Admitting: *Deleted

## 2020-06-22 DIAGNOSIS — I129 Hypertensive chronic kidney disease with stage 1 through stage 4 chronic kidney disease, or unspecified chronic kidney disease: Secondary | ICD-10-CM | POA: Diagnosis not present

## 2020-06-22 DIAGNOSIS — M545 Low back pain, unspecified: Secondary | ICD-10-CM | POA: Diagnosis present

## 2020-06-22 DIAGNOSIS — N1831 Chronic kidney disease, stage 3a: Secondary | ICD-10-CM | POA: Diagnosis not present

## 2020-06-22 DIAGNOSIS — M25519 Pain in unspecified shoulder: Secondary | ICD-10-CM | POA: Insufficient documentation

## 2020-06-22 DIAGNOSIS — Z87891 Personal history of nicotine dependence: Secondary | ICD-10-CM | POA: Insufficient documentation

## 2020-06-22 DIAGNOSIS — Z79899 Other long term (current) drug therapy: Secondary | ICD-10-CM | POA: Insufficient documentation

## 2020-06-22 DIAGNOSIS — R5381 Other malaise: Secondary | ICD-10-CM | POA: Diagnosis not present

## 2020-06-22 DIAGNOSIS — M546 Pain in thoracic spine: Secondary | ICD-10-CM | POA: Diagnosis not present

## 2020-06-22 DIAGNOSIS — J4540 Moderate persistent asthma, uncomplicated: Secondary | ICD-10-CM | POA: Diagnosis not present

## 2020-06-22 DIAGNOSIS — I10 Essential (primary) hypertension: Secondary | ICD-10-CM | POA: Diagnosis not present

## 2020-06-22 DIAGNOSIS — M129 Arthropathy, unspecified: Secondary | ICD-10-CM | POA: Diagnosis not present

## 2020-06-22 LAB — TROPONIN I (HIGH SENSITIVITY): Troponin I (High Sensitivity): 6 ng/L (ref <18)

## 2020-06-22 MED ORDER — HYDROCODONE-ACETAMINOPHEN 5-325 MG PO TABS: 1.0000 | ORAL_TABLET | Freq: Four times a day (QID) | ORAL | 0 refills | Status: DC | PRN

## 2020-06-22 MED ORDER — HYDROCODONE-ACETAMINOPHEN 5-325 MG PO TABS
2.0000 | ORAL_TABLET | Freq: Once | ORAL | Status: AC
  Administered 2020-06-22: 22:00:00 2 via ORAL
  Filled 2020-06-22: qty 2

## 2020-06-22 MED ORDER — HYDROCODONE-ACETAMINOPHEN 5-325 MG PO TABS
1.0000 | ORAL_TABLET | Freq: Four times a day (QID) | ORAL | 0 refills | Status: DC | PRN
Start: 2020-06-22 — End: 2020-06-22

## 2020-06-22 NOTE — ED Notes (Signed)
Unable to obtain blood work after 2 attempts.

## 2020-06-22 NOTE — Discharge Instructions (Addendum)
Begin taking hydrocodone as prescribed as needed for pain.  Return to the emergency department if symptoms significantly worsen or change.  Be sure to follow-up with your primary doctor next week for recheck.

## 2020-06-22 NOTE — Discharge Summary (Signed)
Physician Discharge Summary  RANGER MIDURA [id number] DOB: [birth year] DOA: 06/18/2020  PCP: [name], MD  Admit date: 06/18/2020 Discharge date: 06/22/2020  Admitted From: Home Disposition: Home  Recommendations for Outpatient Follow-up:  1. Follow up with PCP in 1-2 weeks 2. Please obtain BMP/CBC in one week 3. Please follow up with your PCP on the following pending results: Unresulted Labs (From admission, onward)         None       Home Health: None Equipment/Devices: None  Discharge Condition: Stable but deconditioned CODE STATUS: DNR Diet recommendation: Cardiac  Subjective: Patient seen and examined.  Still complains of shoulder pain.  No other complaint.  Denied any chest pain, shortness of breath, palpitation, headache or any other complaint.  Brief/Interim Summary: 74 year old male with history of sleep apnea, asthma, depression, anxiety, PTSD, diabetes mellitus presented to the ED with left shoulder, mid/left scapular pain, he denied any shortness of breath-In the ED he was found to be significantly hypertensive with blood pressures in the 200s, EKG without acute findings and CTA chest was negative for PE or any acute findings.  He was admitted to hospitalist service for hypertensive urgency.  He was started on losartan but after that, he dropped his blood pressure requiring fluid boluses.  Antihypertensives were held.  Due to lower extremity edema, Doppler lower extremity was done which showed DVT for which he was started on Xarelto.  He was seen by PT OT due to weakness in the recommended skilled nursing facility which patient declined.  We then offered patient home health but patient declined that as well.  Patient is medically stable for discharge so he is going to be discharged to home and outpatient PT has been ordered for him.  He is being switched from Xarelto to oral Eliquis.  Medications prescription has been sent to [facility] [place].  It  is to be noted that patient ideally should go to SNF due to weakness but he has declined so for that reason, he remains high risk for further deconditioning and its complications which may include fall.  I personally discussed possible consequences with the patient and try to convince him however despite of lengthy discussion, he remained adamant and continued to decline SNF or home health and verbalized that he will take responsibility for all the consequences.  Of note, patient's blood pressure has remained stable since last several days so I went to resume his amlodipine.  Discharge Diagnoses:  Principal Problem:   Hypertensive crisis Active Problems:   Moderate persistent asthma   Leg swelling   Thrombocytopenia (HCC)   Poor personal hygiene   Stage 3a chronic kidney disease ([place])   Diet-controlled diabetes mellitus ([place])   Class 2 obesity due to excess calories with body mass index (BMI) of 38.0 to 38.9 in adult   Back pain   Hypertensive urgency    Discharge Instructions  Discharge Instructions    Ambulatory referral to Physical Therapy   Complete by: As directed    Evaluation and Treatment.     Allergies as of 06/21/2020   No Known Allergies     Medication List    TAKE these medications   albuterol 108 (90 Base) MCG/ACT inhaler Commonly known as: VENTOLIN HFA INHALE TWO PUFFS EVERY FOUR TO SIX HOURS AS NEEDED FOR COUGH OR WHEEZE. What changed:   how much to take  how to take this  when to take this  reasons to take this  additional instructions  alum & mag hydroxide-simeth [id number] MG/5ML suspension Commonly known as: Mylanta Maximum Strength Take 5 mLs by mouth every 6 (six) hours as needed (abdominal pain or indigestion).   amLODipine 10 MG tablet Commonly known as: NORVASC Take 1 tablet (10 mg total) by mouth daily.   Breo Ellipta 200-25 MCG/INH Aepb Generic drug: fluticasone furoate-vilanterol INHALE 1 PUFF BY MOUTH EVERY DAY What changed: See  the new instructions.   Eliquis DVT/PE Starter Pack Generic drug: Apixaban Starter Pack (10mg  and 5mg ) Take as directed on package: start with two-5mg  tablets twice daily for 7 days. On day 8, switch to one-5mg  tablet twice daily.   furosemide 40 MG tablet Commonly known as: Lasix Take 1 tablet (40 mg total) by mouth daily. TAKE 1 TABLET DAILY FOR 4 DAYS.   montelukast 10 MG tablet Commonly known as: SINGULAIR TAKE 1 TABLET BY MOUTH EVERYDAY AT BEDTIME What changed: See the new instructions.   SYSTANE BALANCE OP Place 1 drop into both eyes daily as needed (dry eyes). Reported on 12/19/2015   triamcinolone 55 MCG/ACT Aero nasal inhaler Commonly known as: Nasacort Allergy 24HR Use one spray in each nostril once daily as directed.       Follow-up Information    [facility] Follow up.   Specialty: Rehabilitation Why: This location will call you with a visit time for outpatient therapy services.  Contact information: [address] [id number] mc [place]       [name], MD. Call.   Specialty: Internal Medicine Why: the office to schedule a hosptial follow up appoitment within 1-2 weeks. Contact information: [place] [address] [phone]              No Known Allergies  Consultations: None   Procedures/Studies: DG Thoracic Spine 2 View  Result Date: 06/18/2020 CLINICAL DATA:  Left shoulder pain beginning last night. EXAM: THORACIC SPINE 2 VIEWS COMPARISON:  None. FINDINGS: Multilevel degenerative changes. No acute fracture identified. No malalignment. IMPRESSION: Multilevel degenerative changes. No fracture or malalignment. Electronically Signed   By: Dorise Bullion III M.D   On: 06/18/2020 10:06   CT ANGIO CHEST PE W OR WO CONTRAST  Result Date: 06/18/2020 CLINICAL DATA:  Left chest and shoulder pain beginning yesterday. Personal history of DVT.  High probability for pulmonary embolism. EXAM: CT ANGIOGRAPHY CHEST WITH CONTRAST TECHNIQUE: Multidetector CT imaging of the chest was performed using the standard protocol during bolus administration of intravenous contrast. Multiplanar CT image reconstructions and MIPs were obtained to evaluate the vascular anatomy. CONTRAST:  21mL OMNIPAQUE IOHEXOL 350 MG/ML SOLN COMPARISON:  12/25/2019 FINDINGS: Cardiovascular: Satisfactory opacification of pulmonary arteries noted, and no pulmonary emboli identified. No evidence of thoracic aortic dissection or aneurysm. Mediastinum/Nodes: No masses or pathologically enlarged lymph nodes identified. Lungs/Pleura: No pulmonary mass, consolidation, or effusion. Mild atelectasis seen in both lung bases which is new since previous study. Upper abdomen: Small hiatal hernia noted. Musculoskeletal: No suspicious bone lesions identified. Review of the MIP images confirms the above findings. IMPRESSION: No evidence of pulmonary embolism. Mild bibasilar atelectasis. Small hiatal hernia. Electronically Signed   By: Marlaine Hind M.D.   On: 06/18/2020 07:52   DG Chest Port 1 View  Result Date: 06/18/2020 CLINICAL DATA:  Chest pain EXAM: PORTABLE CHEST 1 VIEW COMPARISON:  12/25/2019 FINDINGS: The heart size and mediastinal contours are within normal limits. Both lungs are clear. The visualized skeletal structures are unremarkable. IMPRESSION: No active disease. Electronically  Signed   By: Ulyses Jarred M.D.   On: 06/18/2020 03:34   ECHOCARDIOGRAM COMPLETE  Result Date: 06/19/2020    ECHOCARDIOGRAM REPORT   Patient Name:   Shane [name] Date of Exam: 06/19/2020 Medical Rec #:  [id number]          Height:       74.0 in Accession #:    [id number]         Weight:       281.6 lb Date of Birth:  15-Jun-1946          BSA:          2.514 m Patient Age:    74 years           BP:           103/62 mmHg Patient Gender: M                  HR:           76 bpm. Exam Location:  Inpatient  Procedure: 2D Echo, Cardiac Doppler and Color Doppler Indications:    Dyspnea  History:        Patient has prior history of Echocardiogram examinations, most                 recent 12/25/2019. Risk Factors:Sleep Apnea and Diabetes. GERD.                 CKD.  Sonographer:    Clayton Lefort RDCS (AE) Referring Phys: [place]  Sonographer Comments: Patient is morbidly obese and suboptimal subcostal window. Image acquisition challenging due to patient body habitus and Image acquisition challenging due to respiratory motion. No IV access for Definity use. IMPRESSIONS  1. Technically difficult study with limited echo windows and no IV access for contrast  2. Left ventricular ejection fraction, by estimation, is 65 to 70%. The left ventricle has normal function. The left ventricle has no regional wall motion abnormalities. There is severe left ventricular hypertrophy. Left ventricular diastolic parameters  are consistent with Grade I diastolic dysfunction (impaired relaxation).  3. Right ventricular systolic function is low normal. The right ventricular size is normal. Tricuspid regurgitation signal is inadequate for assessing PA pressure.  4. The mitral valve is grossly normal. Trivial mitral valve regurgitation.  5. The aortic valve is tricuspid. Aortic valve regurgitation is not visualized.  6. Aortic dilatation noted. There is borderline dilatation of the aortic root, measuring 38 mm. Comparison(s): Prior images reviewed side by side. Changes from prior study are noted. 12/25/19: LVEF 65-70%, moderate assymetric basal septal hypertrophy, normal RV function. FINDINGS  Left Ventricle: Left ventricular ejection fraction, by estimation, is 65 to 70%. The left ventricle has normal function. The left ventricle has no regional wall motion abnormalities. The left ventricular internal cavity size was small. There is severe left ventricular hypertrophy. Left ventricular diastolic parameters are consistent with Grade I  diastolic dysfunction (impaired relaxation). Indeterminate filling pressures. Right Ventricle: The right ventricular size is normal. Right vetricular wall thickness was not well visualized. Right ventricular systolic function is low normal. Tricuspid regurgitation signal is inadequate for assessing PA pressure. Left Atrium: Left atrial size was normal in size. Right Atrium: Right atrial size was normal in size. Pericardium: There is no evidence of pericardial effusion. Mitral Valve: The mitral valve is grossly normal. Trivial mitral valve regurgitation. MV peak gradient, 2.2 mmHg. The mean mitral valve gradient is 1.0 mmHg. Tricuspid Valve: The tricuspid valve is grossly normal.  Tricuspid valve regurgitation is trivial. Aortic Valve: The aortic valve is tricuspid. Aortic valve regurgitation is not visualized. Aortic valve mean gradient measures 7.0 mmHg. Aortic valve peak gradient measures 14.6 mmHg. Aortic valve area, by VTI measures 2.32 cm. Pulmonic Valve: The pulmonic valve was grossly normal. Pulmonic valve regurgitation is trivial. Aorta: Aortic dilatation noted. There is borderline dilatation of the aortic root, measuring 38 mm. IAS/Shunts: No atrial level shunt detected by color flow Doppler.  LEFT VENTRICLE PLAX 2D LVIDd:         2.90 cm  Diastology LVIDs:         2.30 cm  LV e' medial:    8.48 cm/s LV PW:         2.10 cm  LV E/e' medial:  7.3 LV IVS:        1.90 cm  LV e' lateral:   10.10 cm/s LVOT diam:     2.00 cm  LV E/e' lateral: 6.1 LV SV:         66 LV SV Index:   26 LVOT Area:     3.14 cm  RIGHT VENTRICLE RV Basal diam:  3.00 cm RV S prime:     9.38 cm/s TAPSE (M-mode): 1.5 cm LEFT ATRIUM           Index       RIGHT ATRIUM           Index LA diam:      3.20 cm 1.27 cm/m  RA Area:     14.60 cm LA Vol (A2C): 32.0 ml 12.73 ml/m RA Volume:   27.60 ml  10.98 ml/m LA Vol (A4C): 42.2 ml 16.79 ml/m  AORTIC VALVE AV Area (Vmax):    2.76 cm AV Area (Vmean):   2.56 cm AV Area (VTI):     2.32 cm AV  Vmax:           191.33 cm/s AV Vmean:          118.333 cm/s AV VTI:            0.283 m AV Peak Grad:      14.6 mmHg AV Mean Grad:      7.0 mmHg LVOT Vmax:         168.00 cm/s LVOT Vmean:        96.300 cm/s LVOT VTI:          0.209 m LVOT/AV VTI ratio: 0.74  AORTA Ao Root diam: 3.80 cm Ao Asc diam:  3.60 cm MITRAL VALVE MV Area (PHT): 2.16 cm    SHUNTS MV Peak grad:  2.2 mmHg    Systemic VTI:  0.21 m MV Mean grad:  1.0 mmHg    Systemic Diam: 2.00 cm MV Vmax:       0.74 m/s MV Vmean:      44.8 cm/s MV Decel Time: 352 msec MV E velocity: 62.10 cm/s MV A velocity: 83.10 cm/s MV E/A ratio:  0.75 Lyman Bishop MD Electronically signed by Lyman Bishop MD Signature Date/Time: 06/19/2020/5:48:03 PM    Final    VAS [place] LOWER EXTREMITY VENOUS (DVT)  Result Date: 06/20/2020  Lower Venous DVT Study Indications: Edema, and Pain. 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 Comparison Study: Prior study available from 02/16/19. Performing Technologist: Rogelia Rohrer  Examination Guidelines: A complete evaluation includes B-mode imaging,  spectral Doppler, color Doppler, and power Doppler as needed of all accessible portions of each vessel. Bilateral testing is considered an integral part of a complete examination. Limited examinations for reoccurring indications may be performed as noted. The reflux portion of the exam is performed with the patient in reverse Trendelenburg.  +---------+---------------+---------+-----------+----------+-----------------+ RIGHT    CompressibilityPhasicitySpontaneityPropertiesThrombus Aging    +---------+---------------+---------+-----------+----------+-----------------+ CFV      Full           Yes      Yes                                    +---------+---------------+---------+-----------+----------+-----------------+ SFJ      Full                                                            +---------+---------------+---------+-----------+----------+-----------------+ FV Prox  Full           Yes      Yes                                    +---------+---------------+---------+-----------+----------+-----------------+ FV Mid   None           No       No                   Age Indeterminate +---------+---------------+---------+-----------+----------+-----------------+ FV DistalPartial        Yes      Yes                  Age Indeterminate +---------+---------------+---------+-----------+----------+-----------------+ PFV      Full                                                           +---------+---------------+---------+-----------+----------+-----------------+ POP      Partial        No       No                   Age Indeterminate +---------+---------------+---------+-----------+----------+-----------------+ PTV      None           No       No                   Age Indeterminate +---------+---------------+---------+-----------+----------+-----------------+ PERO     None           No       No                   Age Indeterminate +---------+---------------+---------+-----------+----------+-----------------+   Right Technical Findings: Patient previously diagnosed with DVT in BLE - unsure of proper medication usage as patient is poor historian.  +---------+---------------+---------+-----------+----------+-----------------+ LEFT     CompressibilityPhasicitySpontaneityPropertiesThrombus Aging    +---------+---------------+---------+-----------+----------+-----------------+ CFV      Full           Yes      Yes                                    +---------+---------------+---------+-----------+----------+-----------------+  SFJ      Full                                                           +---------+---------------+---------+-----------+----------+-----------------+ FV Prox  Full           Yes      Yes                                     +---------+---------------+---------+-----------+----------+-----------------+ FV Mid   None           No       No                   Age Indeterminate +---------+---------------+---------+-----------+----------+-----------------+ FV DistalNone           No       No                   Age Indeterminate +---------+---------------+---------+-----------+----------+-----------------+ PFV      Full                                                           +---------+---------------+---------+-----------+----------+-----------------+ POP      Partial        Yes      Yes                  Age Indeterminate +---------+---------------+---------+-----------+----------+-----------------+ PTV      Full                                                           +---------+---------------+---------+-----------+----------+-----------------+ Left peroneal vein not imaged due to bandaged leg. Only able to visualize proximal PTV.  Left Technical Findings: Patient previously diagnosed with DVT in BLE - unsure of proper medication usage as patient is poor historian.   Summary: RIGHT: - Findings consistent with age indeterminate deep vein thrombosis involving the Mid and Distal FV, right popliteal vein, right posterior tibial veins, and right peroneal veins.  LEFT: - Findings consistent with age indeterminate deep vein thrombosis involving the Mid and Dist FV, and left popliteal vein.  *See table(s) above for measurements and observations. Electronically signed by Harold Barban MD on 06/20/2020 at 6:06:30 PM.    Final      Discharge Exam: Vitals:   06/21/20 0500 06/21/20 0853  BP: 122/69   Pulse: 62   Resp: 20   Temp: 98.3 F (36.8 C)   SpO2: 99% 97%   Vitals:   06/21/20 0032 06/21/20 0200 06/21/20 0500 06/21/20 0853  BP: 122/77 120/74 122/69   Pulse: 65 62 62   Resp: 20 20 20    Temp: 98 F (36.7 C) 98 F (36.7 C) 98.3 F (36.8 C)   TempSrc: Oral Oral Oral   SpO2:  98% 99%  97%  Weight:   128.7 kg   Height:  General: Pt is alert, awake, not in acute distress Cardiovascular: RRR, S1/S2 +, no rubs, no gallops Respiratory: CTA bilaterally, no wheezing, no rhonchi Abdominal: Soft, NT, ND, bowel sounds + Extremities: +1 pitting edema bilateral lower extremity, no cyanosis    The results of significant diagnostics from this hospitalization (including imaging, microbiology, ancillary and laboratory) are listed below for reference.     Microbiology: Recent Results (from the past 240 hour(s))  Resp Panel by RT-PCR (Flu A&B, Covid) Nasopharyngeal Swab     Status: None   Collection Time: 06/18/20  8:17 AM   Specimen: Nasopharyngeal Swab; Nasopharyngeal(NP) swabs in vial transport medium  Result Value Ref Range Status   SARS Coronavirus 2 by RT PCR NEGATIVE NEGATIVE Final    Comment: (NOTE) SARS-CoV-2 target nucleic acids are NOT DETECTED.  The SARS-CoV-2 RNA is generally detectable in upper respiratory specimens during the acute phase of infection. The lowest concentration of SARS-CoV-2 viral copies this assay can detect is 138 copies/mL. A negative result does not preclude SARS-Cov-2 infection and should not be used as the sole basis for treatment or other patient management decisions. A negative result may occur with  improper specimen collection/handling, submission of specimen other than nasopharyngeal swab, presence of viral mutation(s) within the areas targeted by this assay, and inadequate number of viral copies(<138 copies/mL). A negative result must be combined with clinical observations, patient history, and epidemiological information. The expected result is Negative.  Fact Sheet for Patients:  EntrepreneurPulse.com.au  Fact Sheet for Healthcare Providers:  IncredibleEmployment.be  This test is no t yet approved or cleared by the [place] FDA and  has been authorized for detection and/or  diagnosis of SARS-CoV-2 by FDA under an Emergency Use Authorization (EUA). This EUA will remain  in effect (meaning this test can be used) for the duration of the COVID-19 declaration under Section 564(b)(1) of the Act, 21 U.S.C.section 360bbb-3(b)(1), unless the authorization is terminated  or revoked sooner.       Influenza A by PCR NEGATIVE NEGATIVE Final   Influenza B by PCR NEGATIVE NEGATIVE Final    Comment: (NOTE) The Xpert Xpress SARS-CoV-2/FLU/RSV plus assay is intended as an aid in the diagnosis of influenza from Nasopharyngeal swab specimens and should not be used as a sole basis for treatment. Nasal washings and aspirates are unacceptable for Xpert Xpress SARS-CoV-2/FLU/RSV testing.  Fact Sheet for Patients: EntrepreneurPulse.com.au  Fact Sheet for Healthcare Providers: IncredibleEmployment.be  This test is not yet approved or cleared by the [place] FDA and has been authorized for detection and/or diagnosis of SARS-CoV-2 by FDA under an Emergency Use Authorization (EUA). This EUA will remain in effect (meaning this test can be used) for the duration of the COVID-19 declaration under Section 564(b)(1) of the Act, 21 U.S.C. section 360bbb-3(b)(1), unless the authorization is terminated or revoked.  Performed at [facility], [place] [address], [place], [place] [address]      Labs: BNP (last 3 results) Recent Labs    12/25/19 0440 06/18/20 0311  BNP 86.0 123456   Basic Metabolic Panel: Recent Labs  Lab 06/18/20 0510 06/19/20 0339 06/21/20 0239  NA 138 136 138  K 4.0 3.6 3.3*  CL 98 100 104  CO2 25 25 25   GLUCOSE 116* 107* 162*  BUN 13 12 27*  CREATININE 1.31* 1.22 1.69*  CALCIUM 9.6 9.1 8.4*   Liver Function Tests: No results for input(s): AST, ALT, ALKPHOS, BILITOT, PROT, ALBUMIN in the last 168 hours. No results for  input(s): LIPASE, AMYLASE in the last 168 hours. No results for input(s): AMMONIA in  the last 168 hours. CBC: Recent Labs  Lab 06/18/20 0311 06/19/20 0339 06/21/20 0239  WBC 6.2 7.1 6.5  HGB 13.5 13.4 11.7*  HCT 44.5 43.4 37.7*  MCV 64.7* 63.4* 63.3*  PLT 78* 175 153   Cardiac Enzymes: No results for input(s): CKTOTAL, CKMB, CKMBINDEX, TROPONINI in the last 168 hours. BNP: Invalid input(s): POCBNP CBG: Recent Labs  Lab 06/20/20 1131 06/20/20 1635 06/20/20 2119 06/21/20 0751 06/21/20 1113  GLUCAP 90 114* 84 88 127*   D-Dimer No results for input(s): DDIMER in the last 72 hours. Hgb A1c No results for input(s): HGBA1C in the last 72 hours. Lipid Profile No results for input(s): CHOL, HDL, LDLCALC, TRIG, CHOLHDL, LDLDIRECT in the last 72 hours. Thyroid function studies No results for input(s): TSH, T4TOTAL, T3FREE, THYROIDAB in the last 72 hours.  Invalid input(s): FREET3 Anemia work up No results for input(s): VITAMINB12, FOLATE, FERRITIN, TIBC, IRON, RETICCTPCT in the last 72 hours. Urinalysis    Component Value Date/Time   COLORURINE YELLOW 12/27/2019 1136   APPEARANCEUR CLEAR 12/27/2019 1136   LABSPEC 1.017 12/27/2019 1136   PHURINE 5.0 12/27/2019 1136   GLUCOSEU NEGATIVE 12/27/2019 1136   HGBUR NEGATIVE 12/27/2019 1136   BILIRUBINUR NEGATIVE 12/27/2019 1136   KETONESUR NEGATIVE 12/27/2019 1136   PROTEINUR NEGATIVE 12/27/2019 1136   NITRITE NEGATIVE 12/27/2019 1136   LEUKOCYTESUR NEGATIVE 12/27/2019 1136   Sepsis Labs Invalid input(s): PROCALCITONIN,  WBC,  LACTICIDVEN Microbiology Recent Results (from the past 240 hour(s))  Resp Panel by RT-PCR (Flu A&B, Covid) Nasopharyngeal Swab     Status: None   Collection Time: 06/18/20  8:17 AM   Specimen: Nasopharyngeal Swab; Nasopharyngeal(NP) swabs in vial transport medium  Result Value Ref Range Status   SARS Coronavirus 2 by RT PCR NEGATIVE NEGATIVE Final    Comment: (NOTE) SARS-CoV-2 target nucleic acids are NOT DETECTED.  The SARS-CoV-2 RNA is generally detectable in upper  respiratory specimens during the acute phase of infection. The lowest concentration of SARS-CoV-2 viral copies this assay can detect is 138 copies/mL. A negative result does not preclude SARS-Cov-2 infection and should not be used as the sole basis for treatment or other patient management decisions. A negative result may occur with  improper specimen collection/handling, submission of specimen other than nasopharyngeal swab, presence of viral mutation(s) within the areas targeted by this assay, and inadequate number of viral copies(<138 copies/mL). A negative result must be combined with clinical observations, patient history, and epidemiological information. The expected result is Negative.  Fact Sheet for Patients:  BloggerCourse.com  Fact Sheet for Healthcare Providers:  SeriousBroker.it  This test is no t yet approved or cleared by the [place] FDA and  has been authorized for detection and/or diagnosis of SARS-CoV-2 by FDA under an Emergency Use Authorization (EUA). This EUA will remain  in effect (meaning this test can be used) for the duration of the COVID-19 declaration under Section 564(b)(1) of the Act, 21 U.S.C.section 360bbb-3(b)(1), unless the authorization is terminated  or revoked sooner.       Influenza A by PCR NEGATIVE NEGATIVE Final   Influenza B by PCR NEGATIVE NEGATIVE Final    Comment: (NOTE) The Xpert Xpress SARS-CoV-2/FLU/RSV plus assay is intended as an aid in the diagnosis of influenza from Nasopharyngeal swab specimens and should not be used as a sole basis for treatment. Nasal washings and aspirates are unacceptable for Xpert Xpress SARS-CoV-2/FLU/RSV testing.  Fact Sheet for Patients: EntrepreneurPulse.com.au  Fact Sheet for Healthcare Providers: IncredibleEmployment.be  This test is not yet approved or cleared by the [place] FDA and has been  authorized for detection and/or diagnosis of SARS-CoV-2 by FDA under an Emergency Use Authorization (EUA). This EUA will remain in effect (meaning this test can be used) for the duration of the COVID-19 declaration under Section 564(b)(1) of the Act, 21 U.S.C. section 360bbb-3(b)(1), unless the authorization is terminated or revoked.  Performed at [facility], [place] [address], [place], [place] [address]      Time coordinating discharge: Over 30 minutes  SIGNED:   Darliss Cheney, MD  [facility] 06/22/2020, 1:30 PM  If 7PM-7AM, please contact night-coverage [email or web address]

## 2020-06-22 NOTE — ED Notes (Signed)
Pt c/o chronic right shoulder pain. Pt denies trauma

## 2020-06-22 NOTE — ED Notes (Signed)
Pt ambulatory to restroom w/out assistance in triage lobby.

## 2020-06-22 NOTE — ED Provider Notes (Signed)
[name] Provider Note   CSN: [id number] Arrival date & time: 06/22/20  1242     History Chief Complaint  Patient presents with  . Shoulder Pain    Shane [name] is a 74 y.o. male.  Patient is a 74 year old male with history of hypertension and recent admission for hypertensive urgency, chronic renal insufficiency, diabetes.  He presents today for evaluation of back pain.  Patient states he has been having serious pain in the center of his back between his shoulder blades.  Patient was admitted several days ago at [facility], then just discharged yesterday.  He underwent extensive testing including laboratory studies, CT of the chest, and had changes made to his antihypertensive medications.  He presents today with ongoing pain.  He denies shortness of breath.  The pain in his back is worse when he moves and tries to sit up.  He tells me he was not sent home with any specific medications.  The history is provided by the patient.  Shoulder Pain Pain details:    Quality:  Aching   Radiates to:  Does not radiate   Severity:  Severe   Duration:  1 week   Timing:  Constant   Progression:  Unchanged Relieved by:  Nothing Worsened by:  Movement Ineffective treatments:  None tried      Past Medical History:  Diagnosis Date  . Acute asthmatic bronchitis   . Anemia   . Anxiety disorder   . Borderline diabetes mellitus   . Borderline hypertension   . Degenerative joint disease   . Depression   . GERD (gastroesophageal reflux disease)   . History of adenomatous polyp of colon   . History of sinusitis   . Posttraumatic stress disorder   . Sleep apnea    Uses CPAP    Patient Active Problem List   Diagnosis Date Noted  . Hypertensive urgency 06/19/2020  . Hypertensive crisis 06/18/2020  . Thrombocytopenia ([place]) 06/18/2020  . Poor personal hygiene 06/18/2020  . Stage 3a chronic kidney disease ([place]) 06/18/2020  . Diet-controlled  diabetes mellitus ([place]) 06/18/2020  . Class 2 obesity due to excess calories with body mass index (BMI) of 38.0 to 38.9 in adult 06/18/2020  . Back pain 06/18/2020  . Leg swelling 06/04/2020  . Educated about COVID-19 virus infection 02/03/2020  . Precordial chest pain 02/03/2020  . Hypertensive emergency 12/26/2019  . ACS (acute coronary syndrome) ([place]) 12/26/2019  . AKI (acute kidney injury) ([place]) 12/26/2019  . Moderate persistent asthma 12/25/2019  . 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 Bradycardia 07/30/2018  . Sleep apnea 07/30/2018  . Murmur 04/30/2018  . Dizziness 04/30/2018  . Palpitations 04/06/2016  . Elevated blood pressure reading without diagnosis of hypertension 01/12/2016  . Sinusitis, chronic 12/19/2015  . Pain in joint, upper arm 11/23/2015  . Numbness and tingling in left arm 11/23/2015  . Asthmatic bronchitis 02/21/2015  . PTSD (post-traumatic stress disorder) 02/21/2015  . Hemorrhoids 06/15/2014  . Microcytic anemia 03/31/2008  . GERD 03/31/2008  . Impaired fasting glucose 03/31/2008    Past Surgical History:  Procedure Laterality Date  . COLONOSCOPY    . FINGER SURGERY Right    index  . NASAL SINUS SURGERY     x 2  . POLYPECTOMY         Family History  Problem Relation Age of Onset  . Diabetes Father   . Arthritis Mother        Unknown cause  of death  . Colon cancer Neg Hx   . Colon polyps Neg Hx   . Rectal cancer Neg Hx   . Stomach cancer Neg Hx   . Allergic rhinitis Neg Hx   . Angioedema Neg Hx   . Asthma Neg Hx   . Eczema Neg Hx   . Immunodeficiency Neg Hx   . Urticaria Neg Hx     Social History   Tobacco Use  . Smoking status: Former Smoker    Packs/day: 0.50    Years: 25.00    Pack years: 12.50    Types: Cigarettes    Quit date: 07/02/1983    Years since quitting: 37.0  . Smokeless tobacco: Never Used  Vaping Use  . Vaping Use: Never used  Substance Use Topics  . Alcohol use: No  . Drug use: No    Home  Medications Prior to Admission medications   Medication Sig Start Date End Date Taking? Authorizing Provider  albuterol (VENTOLIN HFA) 108 (90 Base) MCG/ACT inhaler INHALE TWO PUFFS EVERY FOUR TO SIX HOURS AS NEEDED FOR COUGH OR WHEEZE. Patient taking differently: Inhale 2 puffs into the lungs every 4 (four) hours as needed for wheezing (cough). 04/03/20   Kozlow, Donnamarie Poag, MD  alum & mag hydroxide-simeth [facility] MAXIMUM STRENGTH) 400-400-40 MG/5ML suspension Take 5 mLs by mouth every 6 (six) hours as needed (abdominal pain or indigestion). Patient not taking: Reported on 06/18/2020 06/10/19   Carmin Muskrat, MD  amLODipine (NORVASC) 10 MG tablet Take 1 tablet (10 mg total) by mouth daily. 12/28/19   Samuella Cota, MD  Apixaban Starter Pack, 10mg  and 5mg , (ELIQUIS DVT/PE STARTER PACK) Take as directed on package: start with two-5mg  tablets twice daily for 7 days. On day 8, switch to one-5mg  tablet twice daily. 06/21/20   Pahwani, Einar Grad, MD  BREO ELLIPTA 200-25 MCG/INH AEPB INHALE 1 PUFF BY MOUTH EVERY DAY Patient taking differently: Inhale 1 puff into the lungs daily. 03/16/20   Kozlow, Donnamarie Poag, MD  furosemide (LASIX) 40 MG tablet Take 1 tablet (40 mg total) by mouth daily. TAKE 1 TABLET DAILY FOR 4 DAYS. 06/06/20   Minus Breeding, MD  montelukast (SINGULAIR) 10 MG tablet TAKE 1 TABLET BY MOUTH EVERYDAY AT BEDTIME Patient taking differently: Take 10 mg by mouth at bedtime. 09/09/19   Kozlow, Donnamarie Poag, MD  Propylene Glycol (SYSTANE BALANCE OP) Place 1 drop into both eyes daily as needed (dry eyes). Reported on 12/19/2015    [provider]  triamcinolone (NASACORT ALLERGY 24HR) 55 MCG/ACT AERO nasal inhaler Use one spray in each nostril once daily as directed. Patient not taking: Reported on 06/18/2020 01/28/17   Kozlow, Donnamarie Poag, MD    Allergies    Patient has no known allergies.  Review of Systems   Review of Systems  All other systems reviewed and are negative.   Physical  Exam Updated Vital Signs BP (!) 134/92 (BP Location: Left Arm)   Pulse 64   Temp 98.7 F (37.1 C) (Oral)   Resp 16   SpO2 99%   Physical Exam Vitals and nursing note reviewed.  Constitutional:      General: He is not in acute distress.    Appearance: He is well-developed and well-nourished. He is not diaphoretic.  HENT:     Head: Normocephalic and atraumatic.     Mouth/Throat:     Mouth: Oropharynx is clear and moist.  Cardiovascular:     Rate and Rhythm: Normal rate and regular  rhythm.     Heart sounds: No murmur heard. No friction rub.  Pulmonary:     Effort: Pulmonary effort is normal. No respiratory distress.     Breath sounds: Normal breath sounds. No wheezing or rales.  Abdominal:     General: Bowel sounds are normal. There is no distension.     Palpations: Abdomen is soft.     Tenderness: There is no abdominal tenderness.  Musculoskeletal:        General: No edema. Normal range of motion.     Cervical back: Normal range of motion and neck supple.     Comments: There is tenderness to palpation in the soft tissues of the thoracic region.  There is no bony tenderness or step-off.  Skin:    General: Skin is warm and dry.  Neurological:     Mental Status: He is alert and oriented to person, place, and time.     Coordination: Coordination normal.     ED Results / Procedures / Treatments   Labs (all labs ordered are listed, but only abnormal results are displayed) Labs Reviewed  TROPONIN I (HIGH SENSITIVITY)    EKG EKG Interpretation  Date/Time:  Thursday June 22 2020 22:33:02 EST Ventricular Rate:  58 PR Interval:    QRS Duration: 106 QT Interval:  451 QTC Calculation: 443 R Axis:   14 Text Interpretation: Sinus rhythm Prolonged PR interval Probable left atrial enlargement Abnormal inferior Q waves No significant change since 06/18/2020 Confirmed by Veryl Speak [phone]) on 06/22/2020 11:22:38 PM   Radiology No results  found.  Procedures Procedures (including critical care time)  Medications Ordered in ED Medications  HYDROcodone-acetaminophen (NORCO/VICODIN) 5-325 MG per tablet 2 tablet (has no administration in time range)    ED Course  I have reviewed the triage vital signs and the nursing notes.  Pertinent labs & imaging results that were available during my care of the patient were reviewed by me and considered in my medical decision making (see chart for details).    MDM Rules/Calculators/A&P  Patient is a 74 year old male with past medical history as per HPI presenting with complaints of back pain.  Patient is having pain in his thoracic spine between his shoulder blades.  He was recently admitted and ruled out for dissection or acute cardiac event.  His CT scan was negative and laboratory studies unremarkable.  Patient returns with ongoing pain after being discharged yesterday.  He tells me he was not given any medication for pain upon his discharge.  Work-up today shows unchanged EKG and negative troponin.  I see no further testing that needs performed.  I highly doubt a cardiac etiology or dissection.  To be discharged with pain medicine and as needed return.  Final Clinical Impression(s) / ED Diagnoses Final diagnoses:  None    Rx / DC Orders ED Discharge Orders    None       Veryl Speak, MD 06/22/20 2324

## 2020-06-22 NOTE — ED Triage Notes (Addendum)
Per EMS, pt complains of right shoulder pain. He was discharged from [facility] this morning with same complaint, denies change in symptoms.   BP 129/81 Temp 98 RR 20 HR 68 CBG 135

## 2020-07-04 ENCOUNTER — Telehealth (HOSPITAL_COMMUNITY): Payer: Self-pay | Admitting: Pharmacist

## 2020-07-04 NOTE — Telephone Encounter (Signed)
Encounter opened in error

## 2020-07-05 ENCOUNTER — Other Ambulatory Visit: Payer: Self-pay | Admitting: Cardiology

## 2020-07-15 ENCOUNTER — Other Ambulatory Visit: Payer: Self-pay | Admitting: Internal Medicine

## 2020-07-15 DIAGNOSIS — I10 Essential (primary) hypertension: Secondary | ICD-10-CM

## 2020-07-17 ENCOUNTER — Encounter (HOSPITAL_COMMUNITY): Payer: Self-pay | Admitting: Emergency Medicine

## 2020-07-17 ENCOUNTER — Emergency Department (HOSPITAL_COMMUNITY): Payer: PPO

## 2020-07-17 ENCOUNTER — Emergency Department (HOSPITAL_COMMUNITY)
Admission: EM | Admit: 2020-07-17 | Discharge: 2020-07-18 | Disposition: A | Discharge status: Home / Self Care | Payer: PPO | Attending: Emergency Medicine | Admitting: Emergency Medicine

## 2020-07-17 DIAGNOSIS — R079 Chest pain, unspecified: Secondary | ICD-10-CM | POA: Diagnosis not present

## 2020-07-17 DIAGNOSIS — M546 Pain in thoracic spine: Secondary | ICD-10-CM

## 2020-07-17 DIAGNOSIS — Z87891 Personal history of nicotine dependence: Secondary | ICD-10-CM | POA: Diagnosis not present

## 2020-07-17 DIAGNOSIS — Z7901 Long term (current) use of anticoagulants: Secondary | ICD-10-CM | POA: Diagnosis not present

## 2020-07-17 DIAGNOSIS — Z79899 Other long term (current) drug therapy: Secondary | ICD-10-CM | POA: Insufficient documentation

## 2020-07-17 DIAGNOSIS — I160 Hypertensive urgency: Secondary | ICD-10-CM | POA: Diagnosis not present

## 2020-07-17 DIAGNOSIS — M25511 Pain in right shoulder: Secondary | ICD-10-CM | POA: Diagnosis not present

## 2020-07-17 DIAGNOSIS — I700 Atherosclerosis of aorta: Secondary | ICD-10-CM | POA: Diagnosis not present

## 2020-07-17 DIAGNOSIS — R52 Pain, unspecified: Secondary | ICD-10-CM | POA: Diagnosis not present

## 2020-07-17 DIAGNOSIS — N1831 Chronic kidney disease, stage 3a: Secondary | ICD-10-CM | POA: Diagnosis not present

## 2020-07-17 DIAGNOSIS — E1122 Type 2 diabetes mellitus with diabetic chronic kidney disease: Secondary | ICD-10-CM | POA: Diagnosis not present

## 2020-07-17 DIAGNOSIS — I129 Hypertensive chronic kidney disease with stage 1 through stage 4 chronic kidney disease, or unspecified chronic kidney disease: Secondary | ICD-10-CM | POA: Insufficient documentation

## 2020-07-17 DIAGNOSIS — Z7951 Long term (current) use of inhaled steroids: Secondary | ICD-10-CM | POA: Insufficient documentation

## 2020-07-17 DIAGNOSIS — J45909 Unspecified asthma, uncomplicated: Secondary | ICD-10-CM | POA: Insufficient documentation

## 2020-07-17 DIAGNOSIS — M25512 Pain in left shoulder: Secondary | ICD-10-CM | POA: Diagnosis not present

## 2020-07-17 DIAGNOSIS — I10 Essential (primary) hypertension: Secondary | ICD-10-CM | POA: Diagnosis not present

## 2020-07-17 DIAGNOSIS — M4186 Other forms of scoliosis, lumbar region: Secondary | ICD-10-CM | POA: Diagnosis not present

## 2020-07-17 DIAGNOSIS — R0789 Other chest pain: Secondary | ICD-10-CM | POA: Diagnosis not present

## 2020-07-17 DIAGNOSIS — M47816 Spondylosis without myelopathy or radiculopathy, lumbar region: Secondary | ICD-10-CM | POA: Diagnosis not present

## 2020-07-17 DIAGNOSIS — M48061 Spinal stenosis, lumbar region without neurogenic claudication: Secondary | ICD-10-CM | POA: Diagnosis not present

## 2020-07-17 HISTORY — DX: Other chronic pain: G89.29

## 2020-07-17 LAB — COMPREHENSIVE METABOLIC PANEL
ALT: 15 U/L (ref 0–44)
AST: 29 U/L (ref 15–41)
Albumin: 4.8 g/dL (ref 3.5–5.0)
Alkaline Phosphatase: 99 U/L (ref 38–126)
Anion gap: 14 (ref 5–15)
BUN: 14 mg/dL (ref 8–23)
CO2: 25 mmol/L (ref 22–32)
Calcium: 9.7 mg/dL (ref 8.9–10.3)
Chloride: 101 mmol/L (ref 98–111)
Creatinine, Ser: 1.24 mg/dL (ref 0.61–1.24)
GFR, Estimated: >60 mL/min (ref >60)
Glucose, Bld: 108 mg/dL — ABNORMAL HIGH (ref 70–99)
Potassium: 3.7 mmol/L (ref 3.5–5.1)
Sodium: 140 mmol/L (ref 135–145)
Total Bilirubin: 1.4 mg/dL — ABNORMAL HIGH (ref 0.3–1.2)
Total Protein: 8.6 g/dL — ABNORMAL HIGH (ref 6.5–8.1)

## 2020-07-17 LAB — CBC WITH DIFFERENTIAL/PLATELET
Abs Immature Granulocytes: 0.01 10*3/uL (ref 0.00–0.07)
Basophils Absolute: 0 10*3/uL (ref 0.0–0.1)
Basophils Relative: 0 %
Eosinophils Absolute: 0.2 10*3/uL (ref 0.0–0.5)
Eosinophils Relative: 3 %
HCT: 42 % (ref 39.0–52.0)
Hemoglobin: 13 g/dL (ref 13.0–17.0)
Immature Granulocytes: 0 %
Lymphocytes Relative: 23 %
Lymphs Abs: 1.4 10*3/uL (ref 0.7–4.0)
MCH: 19.8 pg — ABNORMAL LOW (ref 26.0–34.0)
MCHC: 31 g/dL (ref 30.0–36.0)
MCV: 63.9 fL — ABNORMAL LOW (ref 80.0–100.0)
Monocytes Absolute: 0.5 10*3/uL (ref 0.1–1.0)
Monocytes Relative: 9 %
Neutro Abs: 4 10*3/uL (ref 1.7–7.7)
Neutrophils Relative %: 65 %
Platelets: 181 10*3/uL (ref 150–400)
RBC: 6.57 MIL/uL — ABNORMAL HIGH (ref 4.22–5.81)
RDW: 18.9 % — ABNORMAL HIGH (ref 11.5–15.5)
WBC: 6.1 10*3/uL (ref 4.0–10.5)
nRBC: 0.3 % — ABNORMAL HIGH (ref 0.0–0.2)

## 2020-07-17 LAB — TROPONIN I (HIGH SENSITIVITY): Troponin I (High Sensitivity): 14 ng/L (ref <18)

## 2020-07-17 MED ORDER — IOHEXOL 350 MG/ML SOLN
100.0000 mL | Freq: Once | INTRAVENOUS | Status: AC | PRN
  Administered 2020-07-17: 100 mL via INTRAVENOUS

## 2020-07-17 MED ORDER — LABETALOL HCL 5 MG/ML IV SOLN
10.0000 mg | Freq: Once | INTRAVENOUS | Status: AC
  Administered 2020-07-17: 10 mg via INTRAVENOUS
  Filled 2020-07-17: qty 4

## 2020-07-17 MED ORDER — MORPHINE SULFATE (PF) 4 MG/ML IV SOLN
4.0000 mg | Freq: Once | INTRAVENOUS | Status: AC
Start: 2020-07-17 — End: 2020-07-17
  Administered 2020-07-17: 4 mg via INTRAVENOUS
  Filled 2020-07-17: qty 1

## 2020-07-17 NOTE — Telephone Encounter (Signed)
Ok to refill? I don't see where the patient has had a office visit with you. Please advise

## 2020-07-17 NOTE — ED Provider Notes (Signed)
[facility] DEPT Provider Note   CSN: [id number] Arrival date & time: 07/17/20  1405     History Chief Complaint  Patient presents with  . Medication Refill    LEONE CHECCHI is a 75 y.o. male.  HPI      75 year old male with history of sleep apnea, asthma, depression, anxiety, PTSD, diabetes, hypertension, CKD, DVT on Eliquis, recent admission for hypertensive urgency with chest and back pain with recommendation that he go to a SNF which he declined,  presents with concern for worsening chest and back pain.  Reports that the symptoms began about a month ago, however today.  Disc describes sharp pain between his shoulder blades with radiation to the left side of his chest, under his left breast.  He reports that it is slightly better sitting up, worse with some movements.  Denies significant associated shortness of breath, nausea, vomiting, diaphoresis.  Reports that he has been taking hydrocodone to the pain but had run out of the prescription this morning.  Reports that due to the increasing pain he came to the emergency department via EMS.  Reports that his primary care physician told him that they would not refill his hydrocodone prescription.  He did not take his blood pressure medication, the amlodipine, prior to arrival as he normally takes it around 12.  Reports that he is not able to sleep well.  Past Medical History:  Diagnosis Date  . Acute asthmatic bronchitis   . Anemia   . Anxiety disorder   . Borderline diabetes mellitus   . Borderline hypertension   . Chronic back pain   . Degenerative joint disease   . Depression   . GERD (gastroesophageal reflux disease)   . History of adenomatous polyp of colon   . History of sinusitis   . Posttraumatic stress disorder   . Sleep apnea    Uses CPAP    Patient Active Problem List   Diagnosis Date Noted  . Hypertensive urgency 06/19/2020  . Hypertensive crisis 06/18/2020  .  Thrombocytopenia ([place]) 06/18/2020  . Poor personal hygiene 06/18/2020  . Stage 3a chronic kidney disease ([place]) 06/18/2020  . Diet-controlled diabetes mellitus ([place]) 06/18/2020  . Class 2 obesity due to excess calories with body mass index (BMI) of 38.0 to 38.9 in adult 06/18/2020  . Back pain 06/18/2020  . Leg swelling 06/04/2020  . Educated about COVID-19 virus infection 02/03/2020  . Precordial chest pain 02/03/2020  . Hypertensive emergency 12/26/2019  . ACS (acute coronary syndrome) ([place]) 12/26/2019  . AKI (acute kidney injury) ([place]) 12/26/2019  . Moderate persistent asthma 12/25/2019  . Right femoral vein DVT ([place]) 03/01/2019  . Bradycardia 07/30/2018  . Sleep apnea 07/30/2018  . Murmur 04/30/2018  . Dizziness 04/30/2018  . Palpitations 04/06/2016  . Elevated blood pressure reading without diagnosis of hypertension 01/12/2016  . Sinusitis, chronic 12/19/2015  . Pain in joint, upper arm 11/23/2015  . Numbness and tingling in left arm 11/23/2015  . Asthmatic bronchitis 02/21/2015  . PTSD (post-traumatic stress disorder) 02/21/2015  . Hemorrhoids 06/15/2014  . Microcytic anemia 03/31/2008  . GERD 03/31/2008  . Impaired fasting glucose 03/31/2008    Past Surgical History:  Procedure Laterality Date  . COLONOSCOPY    . FINGER SURGERY Right    index  . NASAL SINUS SURGERY     x 2  . POLYPECTOMY         Family History  Problem Relation Age of Onset  . Diabetes Father   .  Arthritis Mother        Unknown cause of death  . Colon cancer Neg Hx   . Colon polyps Neg Hx   . Rectal cancer Neg Hx   . Stomach cancer Neg Hx   . Allergic rhinitis Neg Hx   . Angioedema Neg Hx   . Asthma Neg Hx   . Eczema Neg Hx   . Immunodeficiency Neg Hx   . Urticaria Neg Hx     Social History   Tobacco Use  . Smoking status: Former Smoker    Packs/day: 0.50    Years: 25.00    Pack years: 12.50    Types: Cigarettes    Quit date: 07/02/1983    Years since quitting: 37.0  .  Smokeless tobacco: Never Used  Vaping Use  . Vaping Use: Never used  Substance Use Topics  . Alcohol use: No  . Drug use: No    Home Medications Prior to Admission medications   Medication Sig Start Date End Date Taking? Authorizing Provider  albuterol (VENTOLIN HFA) 108 (90 Base) MCG/ACT inhaler INHALE TWO PUFFS EVERY FOUR TO SIX HOURS AS NEEDED FOR COUGH OR WHEEZE. Patient taking differently: Inhale 2 puffs into the lungs every 4 (four) hours as needed for wheezing (cough). 04/03/20   Kozlow, Donnamarie Poag, MD  alum & mag hydroxide-simeth [facility] MAXIMUM STRENGTH) 400-400-40 MG/5ML suspension Take 5 mLs by mouth every 6 (six) hours as needed (abdominal pain or indigestion). Patient not taking: Reported on 06/18/2020 06/10/19   Carmin Muskrat, MD  amLODipine (NORVASC) 10 MG tablet Take 1 tablet (10 mg total) by mouth daily. 12/28/19   Samuella Cota, MD  Apixaban Starter Pack, 10mg  and 5mg , (ELIQUIS DVT/PE STARTER PACK) Take as directed on package: start with two-5mg  tablets twice daily for 7 days. On day 8, switch to one-5mg  tablet twice daily. 06/21/20   Pahwani, Einar Grad, MD  BREO ELLIPTA 200-25 MCG/INH AEPB INHALE 1 PUFF BY MOUTH EVERY DAY Patient taking differently: Inhale 1 puff into the lungs daily. 03/16/20   Kozlow, Donnamarie Poag, MD  furosemide (LASIX) 40 MG tablet TAKE 1 TABLET (40 MG TOTAL) BY MOUTH DAILY. TAKE 1 TABLET DAILY FOR 4 DAYS. 07/05/20   Minus Breeding, MD  HYDROcodone-acetaminophen (NORCO) 5-325 MG tablet Take 1-2 tablets by mouth every 6 (six) hours as needed. 06/22/20   Veryl Speak, MD  montelukast (SINGULAIR) 10 MG tablet TAKE 1 TABLET BY MOUTH EVERYDAY AT BEDTIME Patient taking differently: Take 10 mg by mouth at bedtime. 09/09/19   Kozlow, Donnamarie Poag, MD  Propylene Glycol (SYSTANE BALANCE OP) Place 1 drop into both eyes daily as needed (dry eyes). Reported on 12/19/2015    [provider]  triamcinolone (NASACORT ALLERGY 24HR) 55 MCG/ACT AERO nasal inhaler Use one spray in  each nostril once daily as directed. Patient not taking: Reported on 06/18/2020 01/28/17   Kozlow, Donnamarie Poag, MD    Allergies    Patient has no known allergies.  Review of Systems   Review of Systems  Constitutional: Negative for fever.  Eyes: Negative for visual disturbance.  Respiratory: Negative for cough and shortness of breath.   Cardiovascular: Positive for chest pain and leg swelling.  Gastrointestinal: Negative for abdominal pain, nausea and vomiting.  Genitourinary: Negative for difficulty urinating and flank pain.  Musculoskeletal: Positive for back pain. Negative for neck stiffness.  Skin: Negative for rash.  Neurological: Negative for syncope.  Psychiatric/Behavioral: Positive for sleep disturbance.    Physical Exam Updated Vital Signs  BP (!) 190/104   Pulse 68   Temp 98.9 F (37.2 C) (Oral)   Resp 18   SpO2 98%   Physical Exam Vitals and nursing note reviewed.  Constitutional:      General: He is not in acute distress.    Appearance: He is well-developed and well-nourished. He is not diaphoretic.  HENT:     Head: Normocephalic and atraumatic.  Eyes:     Extraocular Movements: EOM normal.     Conjunctiva/sclera: Conjunctivae normal.  Cardiovascular:     Rate and Rhythm: Normal rate and regular rhythm.     Pulses: Intact distal pulses.     Heart sounds: Normal heart sounds. No murmur heard. No friction rub. No gallop.   Pulmonary:     Effort: Pulmonary effort is normal. No respiratory distress.     Breath sounds: Normal breath sounds. No wheezing or rales.  Abdominal:     General: There is no distension.     Palpations: Abdomen is soft.     Tenderness: There is no abdominal tenderness. There is no guarding.  Musculoskeletal:        General: Tenderness present. No edema.     Cervical back: Normal range of motion.     Comments: Currently wrapped LE, bilat edema  Skin:    General: Skin is warm and dry.  Neurological:     Mental Status: He is alert and  oriented to person, place, and time.     ED Results / Procedures / Treatments   Labs (all labs ordered are listed, but only abnormal results are displayed) Labs Reviewed  CBC WITH DIFFERENTIAL/PLATELET - Abnormal; Notable for the following components:      Result Value   RBC 6.57 (*)    MCV 63.9 (*)    MCH 19.8 (*)    RDW 18.9 (*)    nRBC 0.3 (*)    All other components within normal limits  COMPREHENSIVE METABOLIC PANEL - Abnormal; Notable for the following components:   Glucose, Bld 108 (*)    Total Protein 8.6 (*)    Total Bilirubin 1.4 (*)    All other components within normal limits  TROPONIN I (HIGH SENSITIVITY)  TROPONIN I (HIGH SENSITIVITY)    EKG EKG Interpretation  Date/Time:  Monday July 17 2020 21:17:20 EST Ventricular Rate:  78 PR Interval:    QRS Duration: 96 QT Interval:  416 QTC Calculation: 474 R Axis:   7 Text Interpretation: Sinus arrhythmia Multiple ventricular premature complexes Probable left atrial enlargement Left ventricular hypertrophy Inferior infarct, old Baseline wander in lead(s) I aVL 12 Lead; [place] No significant change since last tracing Confirmed by Gareth Morgan [phone]) on 07/17/2020 10:44:33 PM   Radiology No results found.  Procedures Procedures (including critical care time)  Medications Ordered in ED Medications  morphine 4 MG/ML injection 4 mg (4 mg Intravenous Given 07/17/20 2126)  labetalol (NORMODYNE) injection 10 mg (10 mg Intravenous Given 07/17/20 2126)  iohexol (OMNIPAQUE) 350 MG/ML injection 100 mL (100 mLs Intravenous Contrast Given 07/17/20 2231)    ED Course  I have reviewed the triage vital signs and the nursing notes.  Pertinent labs & imaging results that were available during my care of the patient were reviewed by me and considered in my medical decision making (see chart for details).    MDM Rules/Calculators/A&P                          75 year old  male with history of sleep apnea, asthma,  depression, anxiety, PTSD, diabetes, hypertension, CKD, DVT on Eliquis, recent admission for hypertensive urgency with chest and back pain with recommendation that he go to a SNF which he declined,  presents with concern for worsening chest and back pain.  DDx includes ACS, PE, dissection, MSK etiology.    Arrives with hypertension  221/111 in the setting of pain and not receiving medications.  Given hypertension and back pain with radiation to chest, ordered labs and CTA dissection study for further evaluation. Is taking eliquis, had negative PE scan 12/19.  EKG without signs of pericarditis or STE.  Labs without acute abnormalities. Troponin normal.  Blood pressures improved with morphine and labetalol.  Plan to obtain CTA, reevaluate. Pending at time of transfer of care to Dr. Dina Rich.    Final Clinical Impression(s) / ED Diagnoses Final diagnoses:  Hypertensive urgency  Chest pain, unspecified type  Acute left-sided thoracic back pain    Rx / DC Orders ED Discharge Orders    None       Gareth Morgan, MD 07/18/20 0004

## 2020-07-17 NOTE — ED Triage Notes (Signed)
Per EMS-complaining of back and B/L shoulder pain-ran out of Hydrocodone-not able to eat and sleep due to not being able manage pain

## 2020-07-18 ENCOUNTER — Encounter (HOSPITAL_COMMUNITY): Payer: Self-pay

## 2020-07-18 ENCOUNTER — Emergency Department (HOSPITAL_COMMUNITY): Payer: PPO

## 2020-07-18 DIAGNOSIS — M25512 Pain in left shoulder: Secondary | ICD-10-CM | POA: Diagnosis not present

## 2020-07-18 DIAGNOSIS — I700 Atherosclerosis of aorta: Secondary | ICD-10-CM | POA: Diagnosis not present

## 2020-07-18 DIAGNOSIS — M25511 Pain in right shoulder: Secondary | ICD-10-CM | POA: Diagnosis not present

## 2020-07-18 DIAGNOSIS — M4186 Other forms of scoliosis, lumbar region: Secondary | ICD-10-CM | POA: Diagnosis not present

## 2020-07-18 DIAGNOSIS — M48061 Spinal stenosis, lumbar region without neurogenic claudication: Secondary | ICD-10-CM | POA: Diagnosis not present

## 2020-07-18 DIAGNOSIS — M47816 Spondylosis without myelopathy or radiculopathy, lumbar region: Secondary | ICD-10-CM | POA: Diagnosis not present

## 2020-07-18 LAB — TROPONIN I (HIGH SENSITIVITY): Troponin I (High Sensitivity): 17 ng/L (ref <18)

## 2020-07-18 MED ORDER — IOHEXOL 350 MG/ML SOLN
100.0000 mL | Freq: Once | INTRAVENOUS | Status: AC | PRN
  Administered 2020-07-18: 100 mL via INTRAVENOUS

## 2020-07-18 MED ORDER — HYDROCODONE-ACETAMINOPHEN 5-325 MG PO TABS
2.0000 | ORAL_TABLET | Freq: Once | ORAL | Status: AC
  Administered 2020-07-18: 2 via ORAL
  Filled 2020-07-18: qty 2

## 2020-07-18 MED ORDER — AMLODIPINE BESYLATE 5 MG PO TABS
10.0000 mg | ORAL_TABLET | Freq: Once | ORAL | Status: AC
  Administered 2020-07-18: 10 mg via ORAL
  Filled 2020-07-18: qty 2

## 2020-07-18 NOTE — Discharge Instructions (Addendum)
You were seen today for your high blood pressure.  You had some chest pain.  Your imaging and EKG is reassuring.  It is very important that you take your blood pressure medications.  Follow-up with your primary doctor for refills of your pain medications.

## 2020-07-18 NOTE — ED Provider Notes (Signed)
Patient signed out pending CTA and repeat troponin.  In brief he initially presented for medication refill for his pain medication.  However, he was notably hypertensive.  He was complaining of chest and back pain.  EKG without ischemic changes.  Troponin x2 has been negative.  CTA chest ordered to rule out dissection.  CTA reviewed.  No aortic abnormalities noted.  On repeat evaluation, blood pressure is 190/104.  He did receive 1 dose of labetalol.  He states his pain has improved.  I gave him his home dose of amlodipine.  He is requesting refill of his pain medication.  I discussed with him that he will need to talk to his primary physician about ongoing pain management needs.  I did dose him 1 dose of oral pain medication here.  Patient will be discharged home with primary care follow-up  After history, exam, and medical workup I feel the patient has been appropriately medically screened and is safe for discharge home. Pertinent diagnoses were discussed with the patient. Patient was given return precautions.    Merryl Hacker, MD 07/18/20 [phone]

## 2020-07-20 ENCOUNTER — Ambulatory Visit: Payer: PPO | Admitting: Internal Medicine

## 2020-07-20 DIAGNOSIS — Z0289 Encounter for other administrative examinations: Secondary | ICD-10-CM

## 2020-08-04 ENCOUNTER — Telehealth: Payer: Self-pay | Admitting: Internal Medicine

## 2020-08-04 NOTE — Telephone Encounter (Signed)
Pt called [facility] requesting TOC from Dr. Sharlet Salina to Wilfred Lacy, NP. Please advise.

## 2020-08-04 NOTE — Telephone Encounter (Signed)
Pt noted seeing you for an acute years ago and that he liked your bedside manner.

## 2020-08-04 NOTE — Telephone Encounter (Signed)
Due to high new patient volume I am not accepting transfer patients. Is there a reason for transfer?

## 2020-08-05 ENCOUNTER — Other Ambulatory Visit: Payer: Self-pay | Admitting: Cardiology

## 2020-08-08 NOTE — Telephone Encounter (Signed)
LM for pt to call and schedule TOC to Wilfred Lacy, NP

## 2020-08-10 ENCOUNTER — Other Ambulatory Visit: Payer: Self-pay

## 2020-08-11 ENCOUNTER — Ambulatory Visit: Payer: PPO | Admitting: Nurse Practitioner

## 2020-08-11 ENCOUNTER — Telehealth: Payer: Self-pay | Admitting: Internal Medicine

## 2020-08-11 ENCOUNTER — Telehealth: Payer: Self-pay | Admitting: Nurse Practitioner

## 2020-08-11 ENCOUNTER — Encounter: Payer: Self-pay | Admitting: Nurse Practitioner

## 2020-08-11 NOTE — Telephone Encounter (Signed)
error 

## 2020-08-11 NOTE — Telephone Encounter (Signed)
I agree

## 2020-08-11 NOTE — Telephone Encounter (Signed)
Pt was no show for appt 08/11/2020 acute (prior to [facility] scheduled for 09/13/20). 1st occurrence at [facility], previous no show with Dr. Sharlet Salina at [facility] mailed that additional no show may result in dismissal.

## 2020-08-14 ENCOUNTER — Encounter: Payer: Self-pay | Admitting: Gastroenterology

## 2020-08-30 ENCOUNTER — Other Ambulatory Visit: Payer: Self-pay | Admitting: Allergy and Immunology

## 2020-09-05 ENCOUNTER — Emergency Department (HOSPITAL_COMMUNITY): Payer: PPO

## 2020-09-05 ENCOUNTER — Emergency Department (HOSPITAL_BASED_OUTPATIENT_CLINIC_OR_DEPARTMENT_OTHER): Payer: PPO

## 2020-09-05 ENCOUNTER — Encounter (HOSPITAL_COMMUNITY): Payer: Self-pay | Admitting: Family Medicine

## 2020-09-05 ENCOUNTER — Inpatient Hospital Stay (HOSPITAL_COMMUNITY)
Admission: EM | Admit: 2020-09-05 | Discharge: 2020-09-09 | DRG: 300 | Disposition: A | Discharge status: Skilled Nursing Facility | Payer: PPO | Attending: Internal Medicine | Admitting: Internal Medicine

## 2020-09-05 ENCOUNTER — Other Ambulatory Visit: Payer: Self-pay

## 2020-09-05 DIAGNOSIS — M25461 Effusion, right knee: Secondary | ICD-10-CM | POA: Diagnosis present

## 2020-09-05 DIAGNOSIS — Z789 Other specified health status: Secondary | ICD-10-CM | POA: Diagnosis present

## 2020-09-05 DIAGNOSIS — R2241 Localized swelling, mass and lump, right lower limb: Secondary | ICD-10-CM | POA: Diagnosis not present

## 2020-09-05 DIAGNOSIS — M79604 Pain in right leg: Secondary | ICD-10-CM | POA: Diagnosis not present

## 2020-09-05 DIAGNOSIS — Z5900 Homelessness unspecified: Secondary | ICD-10-CM | POA: Diagnosis not present

## 2020-09-05 DIAGNOSIS — Z9119 Patient's noncompliance with other medical treatment and regimen: Secondary | ICD-10-CM | POA: Diagnosis not present

## 2020-09-05 DIAGNOSIS — F419 Anxiety disorder, unspecified: Secondary | ICD-10-CM | POA: Diagnosis present

## 2020-09-05 DIAGNOSIS — I87023 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: Secondary | ICD-10-CM | POA: Diagnosis present

## 2020-09-05 DIAGNOSIS — Z79899 Other long term (current) drug therapy: Secondary | ICD-10-CM | POA: Diagnosis not present

## 2020-09-05 DIAGNOSIS — F32A Depression, unspecified: Secondary | ICD-10-CM | POA: Diagnosis present

## 2020-09-05 DIAGNOSIS — R0602 Shortness of breath: Secondary | ICD-10-CM | POA: Diagnosis not present

## 2020-09-05 DIAGNOSIS — R7303 Prediabetes: Secondary | ICD-10-CM | POA: Diagnosis not present

## 2020-09-05 DIAGNOSIS — T45516A Underdosing of anticoagulants, initial encounter: Secondary | ICD-10-CM | POA: Diagnosis present

## 2020-09-05 DIAGNOSIS — Z7401 Bed confinement status: Secondary | ICD-10-CM | POA: Diagnosis not present

## 2020-09-05 DIAGNOSIS — I82411 Acute embolism and thrombosis of right femoral vein: Secondary | ICD-10-CM | POA: Diagnosis not present

## 2020-09-05 DIAGNOSIS — Z86010 Personal history of colonic polyps: Secondary | ICD-10-CM | POA: Diagnosis not present

## 2020-09-05 DIAGNOSIS — R278 Other lack of coordination: Secondary | ICD-10-CM | POA: Diagnosis not present

## 2020-09-05 DIAGNOSIS — I82502 Chronic embolism and thrombosis of unspecified deep veins of left lower extremity: Secondary | ICD-10-CM | POA: Diagnosis present

## 2020-09-05 DIAGNOSIS — M2550 Pain in unspecified joint: Secondary | ICD-10-CM | POA: Diagnosis not present

## 2020-09-05 DIAGNOSIS — I82541 Chronic embolism and thrombosis of right tibial vein: Secondary | ICD-10-CM | POA: Diagnosis not present

## 2020-09-05 DIAGNOSIS — I129 Hypertensive chronic kidney disease with stage 1 through stage 4 chronic kidney disease, or unspecified chronic kidney disease: Secondary | ICD-10-CM | POA: Diagnosis present

## 2020-09-05 DIAGNOSIS — M6281 Muscle weakness (generalized): Secondary | ICD-10-CM | POA: Diagnosis not present

## 2020-09-05 DIAGNOSIS — M25561 Pain in right knee: Secondary | ICD-10-CM

## 2020-09-05 DIAGNOSIS — Z5902 Unsheltered homelessness: Secondary | ICD-10-CM | POA: Diagnosis not present

## 2020-09-05 DIAGNOSIS — I878 Other specified disorders of veins: Secondary | ICD-10-CM | POA: Diagnosis not present

## 2020-09-05 DIAGNOSIS — R030 Elevated blood-pressure reading, without diagnosis of hypertension: Secondary | ICD-10-CM | POA: Diagnosis present

## 2020-09-05 DIAGNOSIS — N1831 Chronic kidney disease, stage 3a: Secondary | ICD-10-CM | POA: Diagnosis present

## 2020-09-05 DIAGNOSIS — M79605 Pain in left leg: Secondary | ICD-10-CM | POA: Diagnosis present

## 2020-09-05 DIAGNOSIS — M1711 Unilateral primary osteoarthritis, right knee: Secondary | ICD-10-CM | POA: Diagnosis not present

## 2020-09-05 DIAGNOSIS — R0902 Hypoxemia: Secondary | ICD-10-CM | POA: Diagnosis not present

## 2020-09-05 DIAGNOSIS — I82723 Chronic embolism and thrombosis of deep veins of upper extremity, bilateral: Secondary | ICD-10-CM | POA: Diagnosis not present

## 2020-09-05 DIAGNOSIS — G4730 Sleep apnea, unspecified: Secondary | ICD-10-CM | POA: Diagnosis present

## 2020-09-05 DIAGNOSIS — R52 Pain, unspecified: Secondary | ICD-10-CM | POA: Diagnosis not present

## 2020-09-05 DIAGNOSIS — M79661 Pain in right lower leg: Secondary | ICD-10-CM | POA: Diagnosis not present

## 2020-09-05 DIAGNOSIS — I10 Essential (primary) hypertension: Secondary | ICD-10-CM | POA: Diagnosis not present

## 2020-09-05 DIAGNOSIS — Z66 Do not resuscitate: Secondary | ICD-10-CM | POA: Diagnosis not present

## 2020-09-05 DIAGNOSIS — J45909 Unspecified asthma, uncomplicated: Secondary | ICD-10-CM | POA: Diagnosis not present

## 2020-09-05 DIAGNOSIS — G8929 Other chronic pain: Secondary | ICD-10-CM | POA: Diagnosis not present

## 2020-09-05 DIAGNOSIS — G4733 Obstructive sleep apnea (adult) (pediatric): Secondary | ICD-10-CM | POA: Diagnosis not present

## 2020-09-05 DIAGNOSIS — F4310 Post-traumatic stress disorder, unspecified: Secondary | ICD-10-CM | POA: Diagnosis present

## 2020-09-05 DIAGNOSIS — M549 Dorsalgia, unspecified: Secondary | ICD-10-CM | POA: Diagnosis present

## 2020-09-05 DIAGNOSIS — R2681 Unsteadiness on feet: Secondary | ICD-10-CM | POA: Diagnosis not present

## 2020-09-05 DIAGNOSIS — I82531 Chronic embolism and thrombosis of right popliteal vein: Secondary | ICD-10-CM | POA: Diagnosis not present

## 2020-09-05 DIAGNOSIS — R488 Other symbolic dysfunctions: Secondary | ICD-10-CM | POA: Diagnosis not present

## 2020-09-05 DIAGNOSIS — Z833 Family history of diabetes mellitus: Secondary | ICD-10-CM

## 2020-09-05 DIAGNOSIS — D6859 Other primary thrombophilia: Secondary | ICD-10-CM | POA: Diagnosis not present

## 2020-09-05 DIAGNOSIS — I87009 Postthrombotic syndrome without complications of unspecified extremity: Secondary | ICD-10-CM | POA: Diagnosis present

## 2020-09-05 DIAGNOSIS — D6869 Other thrombophilia: Secondary | ICD-10-CM | POA: Diagnosis present

## 2020-09-05 DIAGNOSIS — I82511 Chronic embolism and thrombosis of right femoral vein: Secondary | ICD-10-CM | POA: Diagnosis not present

## 2020-09-05 DIAGNOSIS — R460 Very low level of personal hygiene: Secondary | ICD-10-CM | POA: Diagnosis present

## 2020-09-05 DIAGNOSIS — Z87891 Personal history of nicotine dependence: Secondary | ICD-10-CM | POA: Diagnosis not present

## 2020-09-05 DIAGNOSIS — R404 Transient alteration of awareness: Secondary | ICD-10-CM | POA: Diagnosis not present

## 2020-09-05 DIAGNOSIS — M7989 Other specified soft tissue disorders: Secondary | ICD-10-CM | POA: Diagnosis not present

## 2020-09-05 DIAGNOSIS — Z7901 Long term (current) use of anticoagulants: Secondary | ICD-10-CM

## 2020-09-05 DIAGNOSIS — R2689 Other abnormalities of gait and mobility: Secondary | ICD-10-CM | POA: Diagnosis not present

## 2020-09-05 DIAGNOSIS — Z20822 Contact with and (suspected) exposure to covid-19: Secondary | ICD-10-CM | POA: Diagnosis present

## 2020-09-05 DIAGNOSIS — Z7951 Long term (current) use of inhaled steroids: Secondary | ICD-10-CM | POA: Diagnosis not present

## 2020-09-05 DIAGNOSIS — Z91128 Patient's intentional underdosing of medication regimen for other reason: Secondary | ICD-10-CM

## 2020-09-05 DIAGNOSIS — I82409 Acute embolism and thrombosis of unspecified deep veins of unspecified lower extremity: Secondary | ICD-10-CM | POA: Diagnosis present

## 2020-09-05 DIAGNOSIS — R609 Edema, unspecified: Secondary | ICD-10-CM | POA: Diagnosis not present

## 2020-09-05 DIAGNOSIS — Z91199 Patient's noncompliance with other medical treatment and regimen due to unspecified reason: Secondary | ICD-10-CM

## 2020-09-05 DIAGNOSIS — J9811 Atelectasis: Secondary | ICD-10-CM | POA: Diagnosis not present

## 2020-09-05 LAB — CBC WITH DIFFERENTIAL/PLATELET
Abs Immature Granulocytes: 0.03 10*3/uL (ref 0.00–0.07)
Basophils Absolute: 0 10*3/uL (ref 0.0–0.1)
Basophils Relative: 0 %
Eosinophils Absolute: 0.1 10*3/uL (ref 0.0–0.5)
Eosinophils Relative: 1 %
HCT: 37.2 % — ABNORMAL LOW (ref 39.0–52.0)
Hemoglobin: 11.4 g/dL — ABNORMAL LOW (ref 13.0–17.0)
Immature Granulocytes: 0 %
Lymphocytes Relative: 19 %
Lymphs Abs: 1.5 10*3/uL (ref 0.7–4.0)
MCH: 19.8 pg — ABNORMAL LOW (ref 26.0–34.0)
MCHC: 30.6 g/dL (ref 30.0–36.0)
MCV: 64.6 fL — ABNORMAL LOW (ref 80.0–100.0)
Monocytes Absolute: 1 10*3/uL (ref 0.1–1.0)
Monocytes Relative: 13 %
Neutro Abs: 5.3 10*3/uL (ref 1.7–7.7)
Neutrophils Relative %: 67 %
Platelets: 155 10*3/uL (ref 150–400)
RBC: 5.76 MIL/uL (ref 4.22–5.81)
RDW: 18.5 % — ABNORMAL HIGH (ref 11.5–15.5)
WBC: 8 10*3/uL (ref 4.0–10.5)
nRBC: 0 % (ref 0.0–0.2)

## 2020-09-05 LAB — COMPREHENSIVE METABOLIC PANEL
ALT: 10 U/L (ref 0–44)
AST: 23 U/L (ref 15–41)
Albumin: 3.8 g/dL (ref 3.5–5.0)
Alkaline Phosphatase: 74 U/L (ref 38–126)
Anion gap: 9 (ref 5–15)
BUN: 15 mg/dL (ref 8–23)
CO2: 22 mmol/L (ref 22–32)
Calcium: 9.1 mg/dL (ref 8.9–10.3)
Chloride: 105 mmol/L (ref 98–111)
Creatinine, Ser: 1.34 mg/dL — ABNORMAL HIGH (ref 0.61–1.24)
GFR, Estimated: 55 mL/min — ABNORMAL LOW (ref >60)
Glucose, Bld: 105 mg/dL — ABNORMAL HIGH (ref 70–99)
Potassium: 4.6 mmol/L (ref 3.5–5.1)
Sodium: 136 mmol/L (ref 135–145)
Total Bilirubin: 0.9 mg/dL (ref 0.3–1.2)
Total Protein: 7.2 g/dL (ref 6.5–8.1)

## 2020-09-05 LAB — URIC ACID: Uric Acid, Serum: 11.1 mg/dL — ABNORMAL HIGH (ref 3.7–8.6)

## 2020-09-05 LAB — RESP PANEL BY RT-PCR (FLU A&B, COVID) ARPGX2
Influenza A by PCR: NEGATIVE
Influenza B by PCR: NEGATIVE
SARS Coronavirus 2 by RT PCR: NEGATIVE

## 2020-09-05 LAB — BRAIN NATRIURETIC PEPTIDE: B Natriuretic Peptide: 13.7 pg/mL (ref 0.0–100.0)

## 2020-09-05 LAB — TROPONIN I (HIGH SENSITIVITY): Troponin I (High Sensitivity): 6 ng/L (ref <18)

## 2020-09-05 MED ORDER — PANTOPRAZOLE SODIUM 40 MG PO TBEC
40.0000 mg | DELAYED_RELEASE_TABLET | Freq: Every day | ORAL | Status: DC
  Administered 2020-09-05 – 2020-09-09 (×5): 40 mg via ORAL
  Filled 2020-09-05 (×5): qty 1

## 2020-09-05 MED ORDER — TRIAMCINOLONE ACETONIDE 55 MCG/ACT NA AERO
1.0000 | INHALATION_SPRAY | Freq: Every day | NASAL | Status: DC
  Administered 2020-09-07 – 2020-09-08 (×2): 1 via NASAL
  Filled 2020-09-05 (×3): qty 10.8

## 2020-09-05 MED ORDER — FENTANYL CITRATE (PF) 100 MCG/2ML IJ SOLN
12.5000 ug | INTRAMUSCULAR | Status: DC | PRN
  Administered 2020-09-06: 12.5 ug via INTRAVENOUS
  Filled 2020-09-05: qty 2

## 2020-09-05 MED ORDER — ONDANSETRON HCL 4 MG/2ML IJ SOLN: 4.0000 mg | Freq: Four times a day (QID) | INTRAMUSCULAR | Status: DC | PRN

## 2020-09-05 MED ORDER — PREDNISONE 20 MG PO TABS
40.0000 mg | ORAL_TABLET | Freq: Two times a day (BID) | ORAL | Status: AC
  Administered 2020-09-05 – 2020-09-06 (×2): 40 mg via ORAL
  Filled 2020-09-05 (×2): qty 2

## 2020-09-05 MED ORDER — POLYVINYL ALCOHOL 1.4 % OP SOLN
1.0000 [drp] | Freq: Every day | OPHTHALMIC | Status: DC | PRN
  Filled 2020-09-05: qty 15

## 2020-09-05 MED ORDER — MONTELUKAST SODIUM 10 MG PO TABS
10.0000 mg | ORAL_TABLET | Freq: Every day | ORAL | Status: DC
  Administered 2020-09-05 – 2020-09-08 (×4): 10 mg via ORAL
  Filled 2020-09-05 (×5): qty 1

## 2020-09-05 MED ORDER — APIXABAN 5 MG PO TABS
5.0000 mg | ORAL_TABLET | Freq: Two times a day (BID) | ORAL | Status: DC
  Administered 2020-09-05 – 2020-09-09 (×8): 5 mg via ORAL
  Filled 2020-09-05 (×8): qty 1

## 2020-09-05 MED ORDER — POLYETHYLENE GLYCOL 3350 17 G PO PACK: 17.0000 g | PACK | Freq: Every day | ORAL | Status: DC | PRN

## 2020-09-05 MED ORDER — FLUTICASONE FUROATE-VILANTEROL 200-25 MCG/INH IN AEPB
1.0000 | INHALATION_SPRAY | Freq: Every day | RESPIRATORY_TRACT | Status: DC
  Administered 2020-09-07 – 2020-09-09 (×3): 1 via RESPIRATORY_TRACT
  Filled 2020-09-05: qty 28

## 2020-09-05 MED ORDER — ALBUTEROL SULFATE HFA 108 (90 BASE) MCG/ACT IN AERS
2.0000 | INHALATION_SPRAY | RESPIRATORY_TRACT | Status: DC | PRN
  Filled 2020-09-05: qty 6.7

## 2020-09-05 MED ORDER — HYDROCODONE-ACETAMINOPHEN 5-325 MG PO TABS
1.0000 | ORAL_TABLET | Freq: Four times a day (QID) | ORAL | Status: DC | PRN
  Administered 2020-09-06 – 2020-09-07 (×4): 2 via ORAL
  Filled 2020-09-05 (×4): qty 2

## 2020-09-05 MED ORDER — ONDANSETRON HCL 4 MG PO TABS: 4.0000 mg | ORAL_TABLET | Freq: Four times a day (QID) | ORAL | Status: DC | PRN

## 2020-09-05 MED ORDER — FUROSEMIDE 40 MG PO TABS
40.0000 mg | ORAL_TABLET | Freq: Every day | ORAL | Status: DC
  Administered 2020-09-06 – 2020-09-09 (×4): 40 mg via ORAL
  Filled 2020-09-05: qty 1
  Filled 2020-09-05: qty 2
  Filled 2020-09-05: qty 1
  Filled 2020-09-05: qty 2

## 2020-09-05 MED ORDER — AMLODIPINE BESYLATE 10 MG PO TABS
10.0000 mg | ORAL_TABLET | Freq: Every day | ORAL | Status: DC
  Administered 2020-09-05 – 2020-09-09 (×5): 10 mg via ORAL
  Filled 2020-09-05: qty 1
  Filled 2020-09-05: qty 2
  Filled 2020-09-05 (×3): qty 1

## 2020-09-05 MED ORDER — ALUM & MAG HYDROXIDE-SIMETH 200-200-20 MG/5ML PO SUSP: 10.0000 mL | Freq: Four times a day (QID) | ORAL | Status: DC | PRN

## 2020-09-05 MED ORDER — HYDROMORPHONE HCL 1 MG/ML IJ SOLN
1.0000 mg | Freq: Once | INTRAMUSCULAR | Status: AC
Start: 2020-09-05 — End: 2020-09-05
  Administered 2020-09-05: 1 mg via INTRAVENOUS
  Filled 2020-09-05: qty 1
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
## 2020-09-05 NOTE — ED Provider Notes (Signed)
   Patient seen after prior ED provider  Patient is presenting with complaints of bilateral lower extremity pain and worsening edema to the lower extremities.  Patient's work-up demonstrates right leg chronic DVT.  Patient is not currently anticoagulated.  Patient has significant social challenges.  He may be undomiciled - although he denies this.   I am concerned about the patient's ability to care for himself in the outpatient setting.   Hospitalist services is aware of case and will evaluate for admission.   Valarie Merino, MD 09/05/20 [phone]

## 2020-09-05 NOTE — Progress Notes (Signed)
Lower extremity venous has been completed. Preliminary results can be found in CV Proc through chart review.   09/05/20 3:15 PM Shane [name] RVT

## 2020-09-05 NOTE — ED Provider Notes (Signed)
[facility] EMERGENCY DEPARTMENT Provider Note   CSN: [id number] Arrival date & time: 09/05/20  1358     History Chief Complaint  Patient presents with  . Leg Swelling    Shane [name] is a 75 y.o. male.  Presents to ER with concern for leg pain.  Patient reports that today he noted some pain in his right knee right leg.  Denies any trauma.  States he has chronic swelling in his legs, has not noted any increase in swelling.  No redness.  No fevers.  Denies chest pain or difficulty in breathing.  Has not taken any medicine for his pain.  HPI     Past Medical History:  Diagnosis Date  . Acute asthmatic bronchitis   . Anemia   . Anxiety disorder   . Borderline diabetes mellitus   . Borderline hypertension   . Chronic back pain   . Degenerative joint disease   . Depression   . GERD (gastroesophageal reflux disease)   . History of adenomatous polyp of colon   . History of sinusitis   . Posttraumatic stress disorder   . Sleep apnea    Uses CPAP    Patient Active Problem List   Diagnosis Date Noted  . Hypertensive urgency 06/19/2020  . Hypertensive crisis 06/18/2020  . Thrombocytopenia ([place]) 06/18/2020  . Poor personal hygiene 06/18/2020  . Stage 3a chronic kidney disease ([facility]) 06/18/2020  . Diet-controlled diabetes mellitus ([place]) 06/18/2020  . Class 2 obesity due to excess calories with body mass index (BMI) of 38.0 to 38.9 in adult 06/18/2020  . Back pain 06/18/2020  . Leg swelling 06/04/2020  . Educated about COVID-19 virus infection 02/03/2020  . Precordial chest pain 02/03/2020  . Hypertensive emergency 12/26/2019  . ACS (acute coronary syndrome) ([place]) 12/26/2019  . AKI (acute kidney injury) ([place]) 12/26/2019  . Moderate persistent asthma 12/25/2019  . Right femoral vein DVT ([place]) 03/01/2019  . Bradycardia 07/30/2018  . Sleep apnea 07/30/2018  . Murmur 04/30/2018  . Dizziness 04/30/2018  . Palpitations 04/06/2016  . Elevated blood  pressure reading without diagnosis of hypertension 01/12/2016  . Sinusitis, chronic 12/19/2015  . Pain in joint, upper arm 11/23/2015  . Numbness and tingling in left arm 11/23/2015  . Asthmatic bronchitis 02/21/2015  . PTSD (post-traumatic stress disorder) 02/21/2015  . Hemorrhoids 06/15/2014  . Microcytic anemia 03/31/2008  . GERD 03/31/2008  . Impaired fasting glucose 03/31/2008    Past Surgical History:  Procedure Laterality Date  . COLONOSCOPY    . FINGER SURGERY Right    index  . NASAL SINUS SURGERY     x 2  . POLYPECTOMY         Family History  Problem Relation Age of Onset  . Diabetes Father   . Arthritis Mother        Unknown cause of death  . Colon cancer Neg Hx   . Colon polyps Neg Hx   . Rectal cancer Neg Hx   . Stomach cancer Neg Hx   . Allergic rhinitis Neg Hx   . Angioedema Neg Hx   . Asthma Neg Hx   . Eczema Neg Hx   . Immunodeficiency Neg Hx   . Urticaria Neg Hx     Social History   Tobacco Use  . Smoking status: Former Smoker    Packs/day: 0.50    Years: 25.00    Pack years: 12.50    Types: Cigarettes    Quit date: 07/02/1983  Years since quitting: 37.2  . Smokeless tobacco: Never Used  Vaping Use  . Vaping Use: Never used  Substance Use Topics  . Alcohol use: No  . Drug use: No    Home Medications Prior to Admission medications   Medication Sig Start Date End Date Taking? Authorizing Provider  albuterol (VENTOLIN HFA) 108 (90 Base) MCG/ACT inhaler INHALE TWO PUFFS EVERY FOUR TO SIX HOURS AS NEEDED FOR COUGH OR WHEEZE. Patient taking differently: Inhale 2 puffs into the lungs every 4 (four) hours as needed for wheezing (cough). 04/03/20   Kozlow, Donnamarie Poag, MD  alum & mag hydroxide-simeth [facility] MAXIMUM STRENGTH) 400-400-40 MG/5ML suspension Take 5 mLs by mouth every 6 (six) hours as needed (abdominal pain or indigestion). Patient not taking: Reported on 06/18/2020 06/10/19   Carmin Muskrat, MD  amLODipine (NORVASC) 10 MG tablet Take  1 tablet (10 mg total) by mouth daily. 12/28/19   Samuella Cota, MD  Apixaban Starter Pack, 10mg  and 5mg , (ELIQUIS DVT/PE STARTER PACK) Take as directed on package: start with two-5mg  tablets twice daily for 7 days. On day 8, switch to one-5mg  tablet twice daily. 06/21/20   Pahwani, Einar Grad, MD  BREO ELLIPTA 200-25 MCG/INH AEPB INHALE 1 PUFF BY MOUTH EVERY DAY Patient taking differently: Inhale 1 puff into the lungs daily. 03/16/20   Kozlow, Donnamarie Poag, MD  furosemide (LASIX) 40 MG tablet TAKE 1 TABLET BY MOUTH EVERY DAY 08/07/20   Minus Breeding, MD  HYDROcodone-acetaminophen (NORCO) 5-325 MG tablet Take 1-2 tablets by mouth every 6 (six) hours as needed. 06/22/20   Veryl Speak, MD  montelukast (SINGULAIR) 10 MG tablet TAKE 1 TABLET BY MOUTH EVERYDAY AT BEDTIME Patient taking differently: Take 10 mg by mouth at bedtime. 09/09/19   Kozlow, Donnamarie Poag, MD  Propylene Glycol (SYSTANE BALANCE OP) Place 1 drop into both eyes daily as needed (dry eyes). Reported on 12/19/2015    [provider]  triamcinolone (NASACORT ALLERGY 24HR) 55 MCG/ACT AERO nasal inhaler Use one spray in each nostril once daily as directed. Patient not taking: Reported on 06/18/2020 01/28/17   Kozlow, Donnamarie Poag, MD    Allergies    Patient has no known allergies.  Review of Systems   Review of Systems  Constitutional: Negative for chills and fever.  HENT: Negative for ear pain and sore throat.   Eyes: Negative for pain and visual disturbance.  Respiratory: Negative for cough and shortness of breath.   Cardiovascular: Negative for chest pain and palpitations.  Gastrointestinal: Negative for abdominal pain and vomiting.  Genitourinary: Negative for dysuria and hematuria.  Musculoskeletal: Positive for arthralgias. Negative for back pain.  Skin: Negative for color change and rash.  Neurological: Negative for seizures and syncope.  All other systems reviewed and are negative.   Physical Exam Updated Vital Signs BP (!)  147/76 (BP Location: Right Arm)   Pulse 64   Resp 13   Ht 6\' 2"  (1.88 m)   Wt 122.5 kg   SpO2 100%   BMI 34.67 kg/m   Physical Exam Vitals and nursing note reviewed.  Constitutional:      Appearance: He is well-developed and well-nourished.     Comments: Disheveled, chronically ill-appearing  HENT:     Head: Normocephalic and atraumatic.  Eyes:     Conjunctiva/sclera: Conjunctivae normal.  Cardiovascular:     Rate and Rhythm: Normal rate and regular rhythm.     Heart sounds: No murmur heard.   Pulmonary:     Effort: Pulmonary  effort is normal. No respiratory distress.     Breath sounds: Normal breath sounds.  Abdominal:     Palpations: Abdomen is soft.     Tenderness: There is no abdominal tenderness.  Musculoskeletal:        General: No edema.     Cervical back: Neck supple.     Comments: chronic edema in lower legs, there is TTP in right calf, right knee, no significant joint effusion, no erythema over joint, normal joint range of motion sensation intact, feet are warm  Skin:    General: Skin is warm and dry.  Neurological:     General: No focal deficit present.     Mental Status: He is alert.  Psychiatric:        Mood and Affect: Mood and affect normal.     ED Results / Procedures / Treatments   Labs (all labs ordered are listed, but only abnormal results are displayed) Labs Reviewed  CBC WITH DIFFERENTIAL/PLATELET  COMPREHENSIVE METABOLIC PANEL  BRAIN NATRIURETIC PEPTIDE  TROPONIN I (HIGH SENSITIVITY)    EKG EKG Interpretation  Date/Time:  Tuesday September 05 2020 14:14:18 EST Ventricular Rate:  67 PR Interval:    QRS Duration: 91 QT Interval:  425 QTC Calculation: 449 R Axis:   7 Text Interpretation: Sinus arrhythmia Prolonged PR interval Confirmed by Madalyn Rob [phone]) on 09/05/2020 2:44:13 PM   Radiology DG Chest 1 View  Result Date: 09/05/2020 CLINICAL DATA:  Shortness of breath, leg swelling. EXAM: CHEST  1 VIEW COMPARISON:  Chest x-ray  06/18/2020, CT chest 07/17/2020 FINDINGS: The heart size and mediastinal contours are unchanged. Bibasilar atelectasis. No focal consolidation. No pulmonary edema. No pleural effusion. No pneumothorax. No acute osseous abnormality. IMPRESSION: No active disease. Electronically Signed   By: Iven Finn M.D.   On: 09/05/2020 15:05   DG Knee Complete 4 Views Right  Result Date: 09/05/2020 CLINICAL DATA:  Right leg swelling. EXAM: RIGHT KNEE - COMPLETE 4+ VIEW COMPARISON:  None. FINDINGS: No acute fracture or dislocation. Moderate joint effusion. Mild medial compartment joint space narrowing. Small tricompartmental marginal osteophytes. Bony spurring along the medial femoral epicondyle suggestive of prior proximal MCL injury. Bone mineralization is normal. Diffuse soft tissue swelling. Atherosclerotic vascular calcifications. Multiple calcified phleboliths in the lateral right thigh. IMPRESSION: 1. Moderate joint effusion. No acute osseous abnormality. 2. Mild tricompartmental osteoarthritis. Electronically Signed   By: Titus Dubin M.D.   On: 09/05/2020 15:11   VAS [place] LOWER EXTREMITY VENOUS (DVT) (ONLY [facility] & [facility] 7a-7p)  Result Date: 09/05/2020  Lower Venous DVT Study Indications: Pain.  Limitations: Body habitus and poor ultrasound/tissue interface. Comparison Study: no prior Performing Technologist: Abram Sander RVS  Examination Guidelines: A complete evaluation includes B-mode imaging, spectral Doppler, color Doppler, and power Doppler as needed of all accessible portions of each vessel. Bilateral testing is considered an integral part of a complete examination. Limited examinations for reoccurring indications may be performed as noted. The reflux portion of the exam is performed with the patient in reverse Trendelenburg.  +---------+---------------+---------+-----------+----------+-------------------+ RIGHT    CompressibilityPhasicitySpontaneityPropertiesThrombus Aging       +---------+---------------+---------+-----------+----------+-------------------+ CFV      Full           Yes      Yes                                      +---------+---------------+---------+-----------+----------+-------------------+ SFJ  Full                                                             +---------+---------------+---------+-----------+----------+-------------------+ FV Prox  Full                                                             +---------+---------------+---------+-----------+----------+-------------------+ FV Mid   Partial                                      Chronic             +---------+---------------+---------+-----------+----------+-------------------+ FV DistalPartial        Yes      Yes                  Chronic             +---------+---------------+---------+-----------+----------+-------------------+ PFV      Full                                                             +---------+---------------+---------+-----------+----------+-------------------+ POP      Partial        Yes      Yes                  Chronic             +---------+---------------+---------+-----------+----------+-------------------+ PTV      Partial                                      Chronic             +---------+---------------+---------+-----------+----------+-------------------+ PERO                                                  Not well visualized +---------+---------------+---------+-----------+----------+-------------------+   +----+---------------+---------+-----------+----------+-------------------+ LEFTCompressibilityPhasicitySpontaneityPropertiesThrombus Aging      +----+---------------+---------+-----------+----------+-------------------+ CFV                                              Not well visualized +----+---------------+---------+-----------+----------+-------------------+     Summary: RIGHT: -  Findings consistent with 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 - No cystic structure found in the popliteal fossa.   *See table(s) above for measurements and observations.    Preliminary     Procedures Procedures   Medications Ordered in ED Medications  HYDROmorphone (DILAUDID) injection 1 mg (1 mg Intravenous Given 09/05/20 1442)  ED Course  I have reviewed the triage vital signs and the nursing notes.  Pertinent labs & imaging results that were available during my care of the patient were reviewed by me and considered in my medical decision making (see chart for details).    MDM Rules/Calculators/A&P                         75 year old male presenting to the emergency room with concern for right knee and leg pain.  On exam, patient appears very disheveled, chronically ill but not in any acute distress.  Noted to have some tenderness over the knee and calf.  No erythema, normal joint range of motion, no joint swelling, doubt septic joint.  Patient has history of DVT, was prescribed apixaban but patient states he is not currently taking it.  Will check DVT study, plain films, basic labs.  While awaiting further work-up and reassessment, signed out to Dr. Francia Greaves.  Final Clinical Impression(s) / ED Diagnoses Final diagnoses:  Acute pain of right knee  Right calf pain    Rx / DC Orders ED Discharge Orders    None       Lucrezia Starch, MD 09/05/20 1524

## 2020-09-06 DIAGNOSIS — M25561 Pain in right knee: Secondary | ICD-10-CM

## 2020-09-06 DIAGNOSIS — M79604 Pain in right leg: Secondary | ICD-10-CM | POA: Diagnosis not present

## 2020-09-06 DIAGNOSIS — I82723 Chronic embolism and thrombosis of deep veins of upper extremity, bilateral: Secondary | ICD-10-CM | POA: Diagnosis not present

## 2020-09-06 DIAGNOSIS — M79605 Pain in left leg: Secondary | ICD-10-CM | POA: Diagnosis not present

## 2020-09-06 LAB — BASIC METABOLIC PANEL
Anion gap: 7 (ref 5–15)
BUN: 13 mg/dL (ref 8–23)
CO2: 23 mmol/L (ref 22–32)
Calcium: 9 mg/dL (ref 8.9–10.3)
Chloride: 104 mmol/L (ref 98–111)
Creatinine, Ser: 1.28 mg/dL — ABNORMAL HIGH (ref 0.61–1.24)
GFR, Estimated: 58 mL/min — ABNORMAL LOW (ref >60)
Glucose, Bld: 147 mg/dL — ABNORMAL HIGH (ref 70–99)
Potassium: 4.1 mmol/L (ref 3.5–5.1)
Sodium: 134 mmol/L — ABNORMAL LOW (ref 135–145)

## 2020-09-06 LAB — URIC ACID: Uric Acid, Serum: 10.1 mg/dL — ABNORMAL HIGH (ref 3.7–8.6)

## 2020-09-06 NOTE — Evaluation (Signed)
Physical Therapy Evaluation Patient Details Name: Shane [name] MRN: [id number] DOB: 23-May-1946 Today's Date: 09/06/2020   History of Present Illness  Shane [name] is a 75 y.o. male who was found stuck in his SUV by EMS due significant R knee pain. Pt was admitted 12/21 for bilat DVTs and was d/c'd on apixaban, but never followed up or refilled script PMH: hypertension, GERD, degenerative joint disease, DVTs no longer on apixaban, housing insecurity although patient does not admit, has been living in an SUV and reportedly has not been able to get out due to right knee pain.    Clinical Impression  Pt admitted with above. Based off of appearance and conversation it appears patient doesn't take or is unable to care for self properly. Pt functioning at min guard level and would benefit from RW to help off weight R LE for pain management during ambulation. Pt unable to care for self and would benefit from ST-SNF to achieve safe mod I function for safe transition home.    Follow Up Recommendations SNF;Supervision/Assistance - 24 hour    Equipment Recommendations  Rolling walker with 5" wheels    Recommendations for Other Services       Precautions / Restrictions Precautions Precautions: Fall Precaution Comments: bilat LE DVTs - per Dr. Wyline Copas they're chronic and "matured" so there is no risk of breaking off, pt with extremely poor bilat foot hygiene      Mobility  Bed Mobility Overal bed mobility: Modified Independent             General bed mobility comments: HOB elevated, pt able to bring LEs around and off EOB, pt did use bed rail but no physical assist needed    Transfers Overall transfer level: Needs assistance Equipment used: Rolling walker (2 wheeled) Transfers: Sit to/from Stand Sit to Stand: Min guard         General transfer comment: min guard for safety, verbal cues for hand placement, increased time  Ambulation/Gait Ambulation/Gait assistance: Min  guard Gait Distance (Feet): 10 Feet Assistive device: Rolling walker (2 wheeled) Gait Pattern/deviations: Step-to pattern;Decreased stride length;Decreased stance time - right Gait velocity: slow   General Gait Details: min guard ambulation, limited by 8/10 pain in R knee, when asked if the RW assisted he said not really it still hurts however pt demonstrated increased ability to advance and tolerate WBing on R LE with RW, pt reaching for something to hold onto when attempting to amb without AD, pt slow to respond and process directions on how to off weight R LE using incresaed UE WBing on walker  Stairs            Wheelchair Mobility    Modified Rankin (Stroke Patients Only)       Balance Overall balance assessment: Needs assistance Sitting-balance support: Feet supported;No upper extremity supported Sitting balance-Leahy Scale: Good     Standing balance support: Bilateral upper extremity supported Standing balance-Leahy Scale: Poor Standing balance comment: pt to benefit from UE support during standign and ambulation                             Pertinent Vitals/Pain Pain Assessment: 0-10 Pain Score: 8  Pain Location: R knee Pain Descriptors / Indicators: Aching;Discomfort Pain Intervention(s): Monitored during session    Home Living Family/patient expects to be discharged to:: Unsure                 Additional  Comments: pt reports he lives alone in 1 story home however per chart EMS found him in his SUV filled with garbage and suspect he's been living in his car however he won't admit that    Prior Function Level of Independence: Needs assistance   Gait / Transfers Assistance Needed: uses cane intermittently  ADL's / Homemaking Assistance Needed: due to condition of his feet suspect pt is not able to care for self properly        Hand Dominance   Dominant Hand: Right    Extremity/Trunk Assessment   Upper Extremity Assessment Upper  Extremity Assessment: Overall WFL for tasks assessed    Lower Extremity Assessment Lower Extremity Assessment: RLE deficits/detail;LLE deficits/detail RLE Deficits / Details: edema, unable to complete LAQ but doesn't buckle with amb LLE Deficits / Details: swollen, grossly 4/5       Communication   Communication: Expressive difficulties (delayed response time)  Cognition Arousal/Alertness: Awake/alert Behavior During Therapy: Flat affect Overall Cognitive Status: No family/caregiver present to determine baseline cognitive functioning                                 General Comments: pt A&Ox4, unknow baseline cognition however pt with very delayed processing and response time. Able to follow commands but with increased time      General Comments General comments (skin integrity, edema, etc.): pt with bilat LE edema, dried flaky skin with very outgrown toenails with fungus under them, discoloration of bilat LEs    Exercises     Assessment/Plan    PT Assessment Patient needs continued PT services  PT Problem List Decreased strength;Decreased activity tolerance;Decreased mobility;Decreased balance;Decreased range of motion;Decreased coordination;Decreased cognition;Decreased knowledge of use of DME;Decreased safety awareness       PT Treatment Interventions DME instruction;Gait training;Stair training;Functional mobility training;Therapeutic activities;Therapeutic exercise;Balance training    PT Goals (Current goals can be found in the Care Plan section)  Acute Rehab PT Goals Patient Stated Goal: home PT Goal Formulation: With patient Time For Goal Achievement: 09/20/20 Potential to Achieve Goals: Good    Frequency Min 3X/week   Barriers to discharge Other (comment) (suspect pt lives in car)      Co-evaluation               AM-PAC PT "6 Clicks" Mobility  Outcome Measure Help needed turning from your back to your side while in a flat bed without  using bedrails?: None Help needed moving from lying on your back to sitting on the side of a flat bed without using bedrails?: None Help needed moving to and from a bed to a chair (including a wheelchair)?: A Little Help needed standing up from a chair using your arms (e.g., wheelchair or bedside chair)?: A Little Help needed to walk in hospital room?: A Little Help needed climbing 3-5 steps with a railing? : A Lot 6 Click Score: 19    End of Session Equipment Utilized During Treatment: Gait belt Activity Tolerance: Patient limited by pain Patient left: in chair;with call bell/phone within reach Nurse Communication: Mobility status PT Visit Diagnosis: Muscle weakness (generalized) (M62.81);Unsteadiness on feet (R26.81);Difficulty in walking, not elsewhere classified (R26.2)    Time: 3154-0086 PT Time Calculation (min) (ACUTE ONLY): 28 min   Charges:   PT Evaluation $PT Eval Moderate Complexity: 1 Mod PT Treatments $Gait Training: 8-22 mins        Kittie Plater, PT, DPT Acute Rehabilitation Services  Pager #: [phone] Office #: [phone]   [place] 09/06/2020, 1:27 PM

## 2020-09-06 NOTE — Progress Notes (Signed)
Pt arrived to 4E from the ED. Pt bathed, CHG bath completed and new gown applied. Tele applied and CCMD notified. VS stable. Pt oriented to room, call bell and bed. Pt resting comfortably at this time. Will continue to monitor.  Adella Hare, RN

## 2020-09-06 NOTE — Discharge Instructions (Signed)

## 2020-09-06 NOTE — NC FL2 (Signed)
[place] LEVEL OF CARE SCREENING TOOL     IDENTIFICATION  Patient Name: Shane [name] Birthdate: 1946-04-21 Sex: male Admission Date (Current Location): 09/05/2020  [facility] and [place] Number:  Herbalist and Address:  [facility] [place] [address], [place], [place] [address]      Provider Number: [id number]  Attending Physician Name and Address:  Donne Hazel, MD  Relative Name and Phone Number:       Current Level of Care: SNF Recommended Level of Care: [place] Prior Approval Number:    Date Approved/Denied:   PASRR Number: [id number]  Discharge Plan: SNF    Current Diagnoses: Patient Active Problem List   Diagnosis Date Noted  . Bilateral leg pain 09/05/2020  . 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 Poor tolerance for ambulation 09/05/2020  . Effusion of right knee 09/05/2020  . Osteoarthritis of right knee 09/05/2020  . Essential hypertension 09/05/2020  . Noncompliance 09/05/2020  . Homelessness 09/05/2020  . Acquired thrombophilia ([place]) 09/05/2020  . Poor personal hygiene 06/18/2020  . Stage 3a chronic kidney disease ([place]) 06/18/2020  . Diet-controlled diabetes mellitus ([place]) 06/18/2020  . Class 2 obesity due to excess calories with body mass index (BMI) of 38.0 to 38.9 in adult 06/18/2020  . Back pain 06/18/2020  . Leg swelling 06/04/2020  . Educated about COVID-19 virus infection 02/03/2020  . Precordial chest pain 02/03/2020  . ACS (acute coronary syndrome) ([place]) 12/26/2019  . AKI (acute kidney injury) ([place]) 12/26/2019  . Moderate persistent asthma 12/25/2019  . Right femoral vein DVT ([place]) 03/01/2019  . Bradycardia 07/30/2018  . Sleep apnea 07/30/2018  . Murmur 04/30/2018  . Dizziness 04/30/2018  . Palpitations 04/06/2016  . Elevated blood pressure  reading without diagnosis of hypertension 01/12/2016  . Sinusitis, chronic 12/19/2015  . Pain in joint, upper arm 11/23/2015  . Numbness and tingling in left arm 11/23/2015  . Asthmatic bronchitis 02/21/2015  . PTSD (post-traumatic stress disorder) 02/21/2015  . Hemorrhoids 06/15/2014  . Microcytic anemia 03/31/2008  . GERD 03/31/2008  . Impaired fasting glucose 03/31/2008    Orientation RESPIRATION BLADDER Height & Weight     Self,Time,Situation,Place    External catheter,Continent Weight: 270 lb (122.5 kg) Height:  6\' 2"  (188 cm)  BEHAVIORAL SYMPTOMS/MOOD NEUROLOGICAL BOWEL NUTRITION STATUS      Continent Diet (please see discharge summary)  AMBULATORY STATUS COMMUNICATION OF NEEDS Skin   Limited Assist Verbally  (dry,flaky itchy)                       Personal Care Assistance Level of Assistance  Bathing,Dressing,Feeding Bathing Assistance: Limited assistance Feeding assistance: Independent Dressing Assistance: Limited assistance     Functional Limitations Info  Sight,Speech,Hearing Sight Info: Adequate Hearing Info: Adequate Speech Info: Adequate    SPECIAL CARE FACTORS FREQUENCY  PT (By licensed PT),OT (By licensed OT)     PT Frequency: 5x per week OT Frequency: 5x per week            Contractures Contractures Info: Not present    Additional Factors Info  Code Status,Allergies Code Status Info: DNR Allergies Info: NKA           Current Medications (09/06/2020):  This is the current hospital active medication list Current Facility-Administered Medications  Medication Dose Route Frequency Provider Last Rate Last Admin  .  albuterol (VENTOLIN HFA) 108 (90 Base) MCG/ACT inhaler 2 puff  2 puff Inhalation Q4H PRN Blayre Papania, Clanford L, MD      . alum & mag hydroxide-simeth (MAALOX/MYLANTA) 200-200-20 MG/5ML suspension 10 mL  10 mL Oral Q6H PRN Siria Calandro, Clanford L, MD      . amLODipine (NORVASC) tablet 10 mg  10 mg Oral Daily Eriverto Byrnes, Clanford L, MD   10  mg at 09/06/20 0844  . apixaban (ELIQUIS) tablet 5 mg  5 mg Oral BID Wynetta Emery, Clanford L, MD   5 mg at 09/06/20 0846  . fentaNYL (SUBLIMAZE) injection 12.5 mcg  12.5 mcg Intravenous Q2H PRN Shalita Notte, Clanford L, MD   12.5 mcg at 09/06/20 0845  . fluticasone furoate-vilanterol (BREO ELLIPTA) 200-25 MCG/INH 1 puff  1 puff Inhalation Daily Tanishi Nault, Clanford L, MD      . furosemide (LASIX) tablet 40 mg  40 mg Oral Daily Lakeyia Surber, Clanford L, MD   40 mg at 09/06/20 0844  . HYDROcodone-acetaminophen (NORCO/VICODIN) 5-325 MG per tablet 1-2 tablet  1-2 tablet Oral Q6H PRN Murlean Iba, MD   2 tablet at 09/06/20 0548  . montelukast (SINGULAIR) tablet 10 mg  10 mg Oral QHS Eber Ferrufino, Clanford L, MD   10 mg at 09/05/20 2337  . ondansetron (ZOFRAN) tablet 4 mg  4 mg Oral Q6H PRN Linard Daft, Clanford L, MD       Or  . ondansetron (ZOFRAN) injection 4 mg  4 mg Intravenous Q6H PRN Alcario Tinkey, Clanford L, MD      . pantoprazole (PROTONIX) EC tablet 40 mg  40 mg Oral Daily Zakir Henner, Clanford L, MD   40 mg at 09/06/20 0844  . polyethylene glycol (MIRALAX / GLYCOLAX) packet 17 g  17 g Oral Daily PRN Jewelz Kobus, Clanford L, MD      . polyvinyl alcohol (LIQUIFILM TEARS) 1.4 % ophthalmic solution 1 drop  1 drop Both Eyes Daily PRN Eh Sauseda, Clanford L, MD      . triamcinolone (NASACORT) nasal inhaler 1 spray  1 spray Nasal Daily Helaina Stefano, Clanford L, MD         Discharge Medications: Please see discharge summary for a list of discharge medications.  Relevant Imaging Results:  Relevant Lab Results:   Additional Information SSN [id number]     [place] COVID-19 Vaccine 04/10/2020 , 09/28/2019 , 08/29/2019  Vinie Sill, LCSWA

## 2020-09-06 NOTE — TOC Progression Note (Addendum)
Transition of Care (TOC) - Progression Note  Marvetta Gibbons RN, BSN Transitions of Care Unit 4E- RN Case Manager See Treatment Team for direct phone #    Patient Details  Name: Shane [name] MRN: [id number] Date of Birth: 1945-11-11  Transition [facility]) CM/SW Contact  Dahlia Client, Romeo Rabon, RN Phone Number: 09/06/2020, 5:10 PM  Clinical Narrative:    Saw pt for Obs notice, also spoke with pt regarding transition of care needs- per conversation with pt, he states he lives alone, has car for transportation. Pt reports that he has PCP- but he is trying to establish with new primary care with [facility] at [facility] location. States he missed his appointment and plans to call for new one, offered to call for pt- however he declined.  Discussed with pt importance of taking Eliquis for blood clots, cost per insurance $45.  Pt also asked questions about rehab- outpt PT in particular- per chart review noted pt had been referred to outpt PT however pt states he did not hear from them- he reports that he would still be interested in this- discussed recs for [facility] however pt voiced his is not really interested in this at this time (per CSW pt was agreeable to look into this). TOC to f/u with pt again for transition plan SNF vs home.   Call made to [facility] to find out if pt was eligible for any community followup- per [facility] pt is not eligible for their [facility] program- however might be eligible for the [facility] program- will call again tomorrow to see if more info can be found out about this.    Expected Discharge Plan: [place] Barriers to Discharge: [place] Work up,SNF Pending bed offer  Expected Discharge Plan and Services Expected Discharge Plan: [place] In-house Referral: Clinical Social Work     Living arrangements for the past 2 months: [place]                                       Social  Determinants of Health (SDOH) Interventions    Readmission Risk Interventions No flowsheet data found.

## 2020-09-06 NOTE — Progress Notes (Signed)
PROGRESS NOTE    COSIMO SCHERTZER  HWE:[id number] DOB: 06/20/1946 DOA: 09/05/2020 PCP: [name], MD    Brief Narrative:   75 y.o. male with medical history significant of essential hypertension, GERD, degenerative joint disease, DVTs no longer on apixaban, housing insecurity although patient does not admit, has been living in an SUV and reportedly has not been able to get out due to right knee pain. Pt was last hospialized in 12/21, at which time pt was found to have B LE DVT's and discharged on starter pack of eliquis. 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 - TOC consulted to assist with medication - restarting apixaban 5 mg BID - he has no signs or symptoms of PE  Essential hypertension - resumed home amlodine -cont to titrate bp meds as tolerated  OSA - will offer CPAP while in hospital  Housing insecurity - Concerns for homelessness noted -TOC following -Per above, PT recs for SNF  Moderate right knee effusion and severe right knee pain - xray with no fracture findings;  - severe osteoarthritis  and inability to bare weight - uric acid is elevated at 10.1  - Pt given short burst of steroids - Pt consulted  Noncompliance  - TOC consulted to investigate home situation  -Dispo plan for SNF noted - Needs PCP follow up and assistance with medication.   Stage 3a CKD - stable  DVT prophylaxis: eliquis Code Status: DNR Family Communication: Pt in room, family not at bedside  Status is: Observation  The patient remains OBS appropriate and will d/c before 2 midnights.  Dispo: The patient is from: Home              Anticipated d/c is to: SNF              Patient currently is not medically stable to d/c.   Difficult to place patient No       Consultants:     Procedures:     Antimicrobials: Anti-infectives (From admission, onward)   None       Subjective: Complaining of continued BLE pain, concerned he would not tolerate ambulating well  Objective: Vitals:   09/05/20 2200 09/05/20 2246 09/06/20 0317 09/06/20 0844  BP: 140/69 140/67 (!) 158/85 (!) 145/77  Pulse: 62 67 63 72  Resp: 18 19 17 15   Temp:  99.1 F (37.3 C) 98.7 F (37.1 C) 98.5 F (36.9 C)  TempSrc:  Oral Oral Oral  SpO2: 92% 98% 93% 96%  Weight:      Height:        Intake/Output Summary (Last 24  hours) at 09/06/2020 1656 Last data filed at 09/06/2020 1200 Gross per 24 hour  Intake --  Output 1850 ml  Net -1850 ml   Filed Weights   09/05/20 1413  Weight: 122.5 kg    Examination:  General exam: Appears calm and comfortable  Respiratory system: Clear to auscultation. Respiratory effort normal. Cardiovascular system: S1 & S2 heard, Regular Gastrointestinal system: Abdomen is nondistended, soft and nontender. No organomegaly or masses felt. Normal bowel sounds heard. Central nervous system: Alert and oriented. No focal neurological deficits. Extremities: Symmetric 5 x 5 power. Skin: No rashes, lesions Psychiatry: Judgement and insight appear normal. Mood & affect appropriate.    Data Reviewed: I have personally reviewed following labs and imaging studies  CBC: Recent Labs  Lab 09/05/20 1430  WBC 8.0  NEUTROABS 5.3  HGB 11.4*  HCT 37.2*  MCV 64.6*  PLT 213   Basic Metabolic Panel: Recent Labs  Lab 09/05/20 1430 09/06/20 0041  NA 136 134*  K 4.6 4.1  CL 105 104  CO2 22 23  GLUCOSE 105* 147*  BUN 15 13  CREATININE 1.34* 1.28*  CALCIUM 9.1 9.0   GFR: Estimated Creatinine Clearance: 68.3 mL/min (A) (by C-G formula based on SCr of 1.28 mg/dL (H)). Liver Function Tests: Recent Labs  Lab 09/05/20 1430  AST 23  ALT 10  ALKPHOS 74  BILITOT 0.9  PROT 7.2  ALBUMIN 3.8   No results for input(s): LIPASE, AMYLASE in the last 168 hours. No results for input(s): AMMONIA in the last 168 hours. Coagulation Profile: No results for input(s): INR, PROTIME in the last 168 hours. Cardiac Enzymes: No results for input(s): CKTOTAL, CKMB, CKMBINDEX, TROPONINI in the last 168 hours. BNP (last 3 results) No results for input(s): PROBNP in the last 8760 hours. HbA1C: No results for input(s): HGBA1C in the last 72 hours. CBG: No results for input(s): GLUCAP in the last 168 hours. Lipid Profile: No results for input(s): CHOL, HDL, LDLCALC, TRIG, CHOLHDL, LDLDIRECT in the last 72 hours. Thyroid Function Tests: No results for input(s): TSH, T4TOTAL, FREET4, T3FREE, THYROIDAB in the last 72 hours. Anemia Panel: No results for input(s): VITAMINB12, FOLATE, FERRITIN, TIBC, IRON, RETICCTPCT in the last 72 hours. Sepsis Labs: No results for input(s): PROCALCITON, LATICACIDVEN in the last 168 hours.  Recent Results (from the past 240 hour(s))  Resp Panel by RT-PCR (Flu A&B, Covid) Nasopharyngeal Swab     Status: None   Collection Time: 09/05/20  8:04 PM   Specimen: Nasopharyngeal Swab; Nasopharyngeal(NP) swabs in vial transport medium  Result Value Ref Range Status   SARS Coronavirus 2 by RT PCR NEGATIVE NEGATIVE Final    Comment: (NOTE) SARS-CoV-2 target  nucleic acids are NOT DETECTED.  The SARS-CoV-2 RNA is generally detectable in upper respiratory specimens during the acute phase of infection. The lowest concentration of SARS-CoV-2 viral copies this assay can detect is 138 copies/mL. A negative result does not preclude SARS-Cov-2 infection and should not be used as the sole basis for treatment or other patient management decisions. A negative result may occur with  improper specimen collection/handling, submission of specimen other than nasopharyngeal swab, presence of viral mutation(s) within the areas targeted by this assay, and inadequate number of viral copies(<138 copies/mL). A negative result must be combined with clinical observations, patient history, and epidemiological information. The expected result is Negative.  Fact Sheet for Patients:  EntrepreneurPulse.com.au  Fact Sheet for Healthcare Providers:  IncredibleEmployment.be  This test is no t yet approved or cleared  by the [place] and  has been authorized for detection and/or diagnosis of SARS-CoV-2 by FDA under an Emergency Use Authorization (EUA). This EUA will remain  in effect (meaning this test can be used) for the duration of the COVID-19 declaration under Section 564(b)(1) of the Act, 21 U.S.C.section 360bbb-3(b)(1), unless the authorization is terminated  or revoked sooner.       Influenza A by PCR NEGATIVE NEGATIVE Final   Influenza B by PCR NEGATIVE NEGATIVE Final    Comment: (NOTE) The Xpert Xpress SARS-CoV-2/FLU/RSV plus assay is intended as an aid in the diagnosis of influenza from Nasopharyngeal swab specimens and should not be used as a sole basis for treatment. Nasal washings and aspirates are unacceptable for Xpert Xpress SARS-CoV-2/FLU/RSV testing.  Fact Sheet for Patients: EntrepreneurPulse.com.au  Fact Sheet for Healthcare  Providers: IncredibleEmployment.be  This test is not yet approved or cleared by the [place] FDA and has been authorized for detection and/or diagnosis of SARS-CoV-2 by FDA under an Emergency Use Authorization (EUA). This EUA will remain in effect (meaning this test can be used) for the duration of the COVID-19 declaration under Section 564(b)(1) of the Act, 21 U.S.C. section 360bbb-3(b)(1), unless the authorization is terminated or revoked.  Performed at [facility], [place] [address], [place], [place] [address]      Radiology Studies: DG Chest 1 View  Result Date: 09/05/2020 CLINICAL DATA:  Shortness of breath, leg swelling. EXAM: CHEST  1 VIEW COMPARISON:  Chest x-ray 06/18/2020, CT chest 07/17/2020 FINDINGS: The heart size and mediastinal contours are unchanged. Bibasilar atelectasis. No focal consolidation. No pulmonary edema. No pleural effusion. No pneumothorax. No acute osseous abnormality. IMPRESSION: No active disease. Electronically Signed   By: Iven Finn M.D.   On: 09/05/2020 15:05   DG Knee Complete 4 Views Right  Result Date: 09/05/2020 CLINICAL DATA:  Right leg swelling. EXAM: RIGHT KNEE - COMPLETE 4+ VIEW COMPARISON:  None. FINDINGS: No acute fracture or dislocation. Moderate joint effusion. Mild medial compartment joint space narrowing. Small tricompartmental marginal osteophytes. Bony spurring along the medial femoral epicondyle suggestive of prior proximal MCL injury. Bone mineralization is normal. Diffuse soft tissue swelling. Atherosclerotic vascular calcifications. Multiple calcified phleboliths in the lateral right thigh. IMPRESSION: 1. Moderate joint effusion. No acute osseous abnormality. 2. Mild tricompartmental osteoarthritis. Electronically Signed   By: Titus Dubin M.D.   On: 09/05/2020 15:11   VAS [place] LOWER EXTREMITY VENOUS (DVT) (ONLY MC & [facility] 7a-7p)  Result Date: 09/05/2020  Lower Venous DVT Study Indications: Pain.   Limitations: Body habitus and poor ultrasound/tissue interface. Comparison Study: no prior Performing Technologist: Abram Sander RVS  Examination Guidelines: A complete evaluation includes B-mode imaging, spectral Doppler, color Doppler, and power Doppler as needed of all accessible portions of each vessel. Bilateral testing is considered an integral part of a complete examination. Limited examinations for reoccurring indications may be performed as noted. The reflux portion of the exam is performed with the patient in reverse Trendelenburg.  +---------+---------------+---------+-----------+----------+-------------------+  RIGHT     Compressibility Phasicity Spontaneity Properties Thrombus Aging       +---------+---------------+---------+-----------+----------+-------------------+  CFV       Full            Yes       Yes                                         +---------+---------------+---------+-----------+----------+-------------------+  SFJ       Full                                                                  +---------+---------------+---------+-----------+----------+-------------------+  FV Prox   Full                                                                  +---------+---------------+---------+-----------+----------+-------------------+  FV Mid    Partial                                          Chronic              +---------+---------------+---------+-----------+----------+-------------------+  FV Distal Partial         Yes       Yes                    Chronic              +---------+---------------+---------+-----------+----------+-------------------+  PFV       Full                                                                  +---------+---------------+---------+-----------+----------+-------------------+  POP       Partial         Yes       Yes                    Chronic              +---------+---------------+---------+-----------+----------+-------------------+  PTV       Partial                                           Chronic              +---------+---------------+---------+-----------+----------+-------------------+  PERO                                                       Not well visualized  +---------+---------------+---------+-----------+----------+-------------------+   +----+---------------+---------+-----------+----------+-------------------+  LEFT Compressibility Phasicity Spontaneity Properties Thrombus Aging       +----+---------------+---------+-----------+----------+-------------------+  CFV                                                   Not well visualized  +----+---------------+---------+-----------+----------+-------------------+  Summary: RIGHT: - Findings consistent with 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 - No cystic structure found in the popliteal fossa.   *See table(s) above for measurements and observations. Electronically signed by Deitra Mayo MD on 09/05/2020 at 4:58:31 PM.    Final     Scheduled Meds:  amLODipine  10 mg Oral Daily   apixaban  5 mg Oral BID   fluticasone furoate-vilanterol  1 puff Inhalation Daily   furosemide  40 mg Oral Daily   montelukast  10 mg Oral QHS   pantoprazole  40 mg Oral Daily   triamcinolone  1 spray Nasal Daily   Continuous Infusions:   LOS: 0 days   Marylu Lund, MD [facility] Pager On Amion  If 7PM-7AM, please contact night-coverage 09/06/2020, 4:56 PM

## 2020-09-06 NOTE — Care Management Obs Status (Signed)
[place] NOTIFICATION   Patient Details  Name: Shane [name] MRN: [id number] Date of Birth: 07/30/1945   Medicare Observation Status Notification Given:  Yes    Dawayne Patricia, RN 09/06/2020, 4:38 PM

## 2020-09-06 NOTE — TOC Benefit Eligibility Note (Signed)
Transition of Care [facility]) Benefit Eligibility Note    Patient Details  Name: Shane [name] MRN: [id number] Date of Birth: January 25, 1946   Medication/Dose: Arne [place]  5 MG BID  Covered?: Yes  Tier: 3 Drug  Prescription Coverage [facility]  [place] with Person/Company/Phone Number:: DIANE  @ [employer] #  [phone]  Co-Pay: $45.00  Prior Approval: No  Deductible: Unmet       Memory [place] Phone Number: 09/06/2020, 1:51 PM

## 2020-09-06 NOTE — TOC Initial Note (Signed)
Transition of Care [facility]) - Initial/Assessment Note    Patient Details  Name: Shane [name] MRN: [id number] Date of Birth: Jul 12, 1945  Transition of Care [facility]) CM/SW Contact:    Vinie Sill, [place] Phone Number: 09/06/2020, 3:50 PM  Clinical Narrative:                  CSW visit with patient. CSW introduced self and explained role. CSW discussed PT recommendation of short term rehab at [facility]. Patient states he lives home alone at [address] in [place](has been there for 49 years). Patient states he is NOT homeless or lives in his car and he is not sure why this is being communicated to the CSW. Patient explained he was received by the ambulance in front of his home. Patient denied concerns with transportation or need for medication assistance.  Patient states he lives home alone and believes it would be best for him to get rehab before discharging back home. CSW explained the SNF process. Patient states no preferred SNF. CSW explained HTA co-pay payments. Patient states he has received the covid vaccines and the booster shot. Patient states no questions at this time.  CSW will provide bed offers once available CSW will continue to follow and assist with discharge planning.   Thurmond Butts, MSW, LCSW Clinical Social Worker   Expected Discharge Plan: Skilled Nursing Facility Barriers to Discharge: Insurance Authorization,Continued Medical Work up,SNF Pending bed offer   Patient Goals and CMS Choice        Expected Discharge Plan and Services Expected Discharge Plan: [place] In-house Referral: Clinical Social Work     Living arrangements for the past 2 months: [place]                                      Prior Living Arrangements/Services Living arrangements for the past 2 months: [place] Lives with:: Self Patient language and need for interpreter reviewed:: No        Need for Family  Participation in Patient Care: Yes (Comment) Care giver support system in place?: Yes (comment)   Criminal Activity/Legal Involvement Pertinent to Current Situation/Hospitalization: No - Comment as needed  Activities of Daily Living      Permission Sought/Granted                  Emotional Assessment Appearance:: Appears stated age Attitude/Demeanor/Rapport: Engaged,Self-Confident Affect (typically observed): Accepting,Pleasant,Appropriate Orientation: : Oriented to Self,Oriented to Place,Oriented to  Time,Oriented to Situation Alcohol / Substance Use: Not Applicable Psych Involvement: No (comment)  Admission diagnosis:  Leg swelling [M79.89] Bilateral leg pain [M79.604, M79.605] Right calf pain [M79.661] Acute pain of right knee [M25.561] Chronic deep vein thrombosis (DVT) of femoral vein of right lower extremity ([place]) [I82.511] Patient Active Problem List   Diagnosis Date Noted  . Bilateral leg pain 09/05/2020  . 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 Poor tolerance for ambulation 09/05/2020  . Effusion of right knee 09/05/2020  . Osteoarthritis of right knee 09/05/2020  . Essential hypertension 09/05/2020  . Noncompliance 09/05/2020  . Homelessness 09/05/2020  . Acquired thrombophilia ([place]) 09/05/2020  . Poor personal hygiene 06/18/2020  . Stage 3a chronic kidney disease ([place]) 06/18/2020  . Diet-controlled diabetes mellitus ([place]) 06/18/2020  . Class 2 obesity due to excess  calories with body mass index (BMI) of 38.0 to 38.9 in adult 06/18/2020  . Back pain 06/18/2020  . Leg swelling 06/04/2020  . Educated about COVID-19 virus infection 02/03/2020  . Precordial chest pain 02/03/2020  . ACS (acute coronary syndrome) ([place]) 12/26/2019  . AKI (acute kidney injury) ([place]) 12/26/2019  . Moderate persistent asthma 12/25/2019  . Right femoral  vein DVT ([place]) 03/01/2019  . Bradycardia 07/30/2018  . Sleep apnea 07/30/2018  . Murmur 04/30/2018  . Dizziness 04/30/2018  . Palpitations 04/06/2016  . Elevated blood pressure reading without diagnosis of hypertension 01/12/2016  . Sinusitis, chronic 12/19/2015  . Pain in joint, upper arm 11/23/2015  . Numbness and tingling in left arm 11/23/2015  . Asthmatic bronchitis 02/21/2015  . PTSD (post-traumatic stress disorder) 02/21/2015  . Hemorrhoids 06/15/2014  . Microcytic anemia 03/31/2008  . GERD 03/31/2008  . Impaired fasting glucose 03/31/2008   PCP:  [name], MD Pharmacy:   [id number] - [place], [place] [address] [place] [address] Phone: [phone] Fax: [phone]  [facility] [place], [place] - [address] [place] [address] Phone: [phone] Fax: [phone]     Social Determinants of Health (SDOH) Interventions    Readmission Risk Interventions No flowsheet data found.

## 2020-09-07 DIAGNOSIS — I82723 Chronic embolism and thrombosis of deep veins of upper extremity, bilateral: Secondary | ICD-10-CM | POA: Diagnosis not present

## 2020-09-07 DIAGNOSIS — M79605 Pain in left leg: Secondary | ICD-10-CM | POA: Diagnosis not present

## 2020-09-07 DIAGNOSIS — M79604 Pain in right leg: Secondary | ICD-10-CM | POA: Diagnosis not present

## 2020-09-07 DIAGNOSIS — D6869 Other thrombophilia: Secondary | ICD-10-CM | POA: Diagnosis not present

## 2020-09-07 LAB — COMPREHENSIVE METABOLIC PANEL
ALT: 8 U/L (ref 0–44)
AST: 16 U/L (ref 15–41)
Albumin: 3.6 g/dL (ref 3.5–5.0)
Alkaline Phosphatase: 75 U/L (ref 38–126)
Anion gap: 11 (ref 5–15)
BUN: 16 mg/dL (ref 8–23)
CO2: 23 mmol/L (ref 22–32)
Calcium: 9 mg/dL (ref 8.9–10.3)
Chloride: 100 mmol/L (ref 98–111)
Creatinine, Ser: 1.35 mg/dL — ABNORMAL HIGH (ref 0.61–1.24)
GFR, Estimated: 54 mL/min — ABNORMAL LOW (ref >60)
Glucose, Bld: 105 mg/dL — ABNORMAL HIGH (ref 70–99)
Potassium: 3.7 mmol/L (ref 3.5–5.1)
Sodium: 134 mmol/L — ABNORMAL LOW (ref 135–145)
Total Bilirubin: 0.9 mg/dL (ref 0.3–1.2)
Total Protein: 7.1 g/dL (ref 6.5–8.1)

## 2020-09-07 LAB — CBC
HCT: 35.6 % — ABNORMAL LOW (ref 39.0–52.0)
Hemoglobin: 11.6 g/dL — ABNORMAL LOW (ref 13.0–17.0)
MCH: 20.1 pg — ABNORMAL LOW (ref 26.0–34.0)
MCHC: 32.6 g/dL (ref 30.0–36.0)
MCV: 61.8 fL — ABNORMAL LOW (ref 80.0–100.0)
Platelets: 175 10*3/uL (ref 150–400)
RBC: 5.76 MIL/uL (ref 4.22–5.81)
RDW: 18.6 % — ABNORMAL HIGH (ref 11.5–15.5)
WBC: 9.9 10*3/uL (ref 4.0–10.5)
nRBC: 0 % (ref 0.0–0.2)

## 2020-09-07 LAB — GLUCOSE, CAPILLARY: Glucose-Capillary: 214 mg/dL — ABNORMAL HIGH (ref 70–99)

## 2020-09-07 MED ORDER — METHYLPREDNISOLONE SODIUM SUCC 40 MG IJ SOLR
40.0000 mg | Freq: Once | INTRAMUSCULAR | Status: AC
  Administered 2020-09-07: 40 mg via INTRAVENOUS
  Filled 2020-09-07: qty 1

## 2020-09-07 NOTE — TOC Progression Note (Signed)
Transition of Care [facility]) - Progression Note    Patient Details  Name: Shane [name] MRN: [id number] Date of Birth: [birth year]  Transition of Care [facility]) CM/SW [place], [place] Phone Number: 09/07/2020, 4:20 PM  Clinical Narrative:     CSW visit with patient at bedside- CSW verbally gave patient bed offers. Patient chose [facility].  CSW called [facility]- sent and left voice message -waiting on bed confirmation CSW called [facility] - Left voice message   CSW will continue to follow and assist with discharge planning.  Thurmond Butts, MSW, LCSW Clinical Social Worker   Expected Discharge Plan: Skilled Nursing Facility Barriers to Discharge: Insurance Authorization,Continued Medical Work up,SNF Pending bed offer  Expected Discharge Plan and Services Expected Discharge Plan: [place] In-house Referral: Clinical Social Work     Living arrangements for the past 2 months: [place]                                       Social Determinants of Health (SDOH) Interventions    Readmission Risk Interventions No flowsheet data found.

## 2020-09-07 NOTE — Progress Notes (Signed)
PROGRESS NOTE    Shane [name]  CZY:[id number] DOB: [birth year] DOA: 09/05/2020 PCP: [name], MD    Brief Narrative:   75 y.o. male with medical history significant of essential hypertension, GERD, degenerative joint disease, DVTs no longer on apixaban, housing insecurity although patient does not admit, has been living in an SUV and reportedly has not been able to get out due to right knee pain. Pt was last hospialized in 12/21, at which time pt was found to have B LE DVT's and discharged on starter pack of eliquis. 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 Pt reported some improvement with prednisone. Still with LE discomfort. Will give trial of IV solumedrol  Chronic DVTs of bilateral legs - Pt has failed to continue taking apixaban after completing starter pack from 12/21. - TOC consulted to assist with medication - restarting apixaban 5 mg BID - he has no signs or symptoms of PE  Essential hypertension - resumed home norvasc -cont to titrate bp meds as tolerated  OSA - will offer CPAP while in hospital  Housing insecurity - Concerns for homelessness noted -TOC  following -Per above, PT recs for SNF  Moderate right knee effusion and severe right knee pain - xray with no fracture findings;  - severe osteoarthritis and inability to bare weight - uric acid is elevated at 10.1  - Pt given short burst of steroids with some improvement. Will give trial of IV solumedrol - Pt consulted  Noncompliance  - TOC consulted to investigate home situation  -Dispo plan for SNF noted - Needs PCP follow up and assistance with medication.   Stage 3a CKD - stable  DVT prophylaxis: eliquis Code Status: DNR Family Communication: Pt in room, family not at bedside  Status is: Observation  The patient remains OBS appropriate and will d/c before 2 midnights.  Dispo: The patient is from: Home              Anticipated d/c is to: SNF              Patient currently is not medically stable to d/c.   Difficult to place patient No   Consultants:     Procedures:     Antimicrobials: Anti-infectives (From admission, onward)   None      Subjective: Still reporting some Le pain, improved since trial of prednisone  Objective: Vitals:   09/07/20 0336 09/07/20 0806 09/07/20 0854 09/07/20 1211  BP: (!) 146/88  (!) 141/83 110/76  Pulse: 100 (!) 49 61 60  Resp: 16 13 13 16   Temp: 98.3 F (36.8 C)  98.3 F (36.8 C) 98 F (36.7 C)  TempSrc:  Oral  Oral Oral  SpO2:  92% 96% 93%  Weight:      Height:        Intake/Output Summary (Last 24 hours) at 09/07/2020 1535 Last data filed at 09/07/2020 1522 Gross per 24 hour  Intake --  Output 3600 ml  Net -3600 ml   Filed Weights   09/05/20 1413  Weight: 122.5 kg    Examination: General exam: Awake, laying in bed, in nad Respiratory system: Normal respiratory effort, no wheezing Cardiovascular system: regular rate, s1, s2 Gastrointestinal system: Soft, nondistended, positive BS Central nervous system: CN2-12 grossly intact, strength intact Extremities: Perfused, no clubbing Skin: Normal skin turgor,  no notable skin lesions seen Psychiatry: Mood normal // no visual hallucinations   Data Reviewed: I have personally reviewed following labs and imaging studies  CBC: Recent Labs  Lab 09/05/20 1430 09/07/20 0105  WBC 8.0 9.9  NEUTROABS 5.3  --   HGB 11.4* 11.6*  HCT 37.2* 35.6*  MCV 64.6* 61.8*  PLT 155 563   Basic Metabolic Panel: Recent Labs  Lab 09/05/20 1430 09/06/20 0041 09/07/20 0105  NA 136 134* 134*  K 4.6 4.1 3.7  CL 105 104 100  CO2 22 23 23   GLUCOSE 105* 147* 105*  BUN 15 13 16   CREATININE 1.34* 1.28* 1.35*  CALCIUM 9.1 9.0 9.0   GFR: Estimated Creatinine Clearance: 64.7 mL/min (A) (by C-G formula based on SCr of 1.35 mg/dL (H)). Liver Function Tests: Recent Labs  Lab 09/05/20 1430 09/07/20 0105  AST 23 16  ALT 10 8  ALKPHOS 74 75  BILITOT 0.9 0.9  PROT 7.2 7.1  ALBUMIN 3.8 3.6   No results for input(s): LIPASE, AMYLASE in the last 168 hours. No results for input(s): AMMONIA in the last 168 hours. Coagulation Profile: No results for input(s): INR, PROTIME in the last 168 hours. Cardiac Enzymes: No results for input(s): CKTOTAL, CKMB, CKMBINDEX, TROPONINI in the last 168 hours. BNP (last 3 results) No results for input(s): PROBNP in the last 8760 hours. HbA1C: No results for input(s): HGBA1C in the last 72 hours. CBG: No results for input(s): GLUCAP in the last 168 hours. Lipid Profile: No results for input(s): CHOL, HDL, LDLCALC, TRIG, CHOLHDL, LDLDIRECT in the last 72 hours. Thyroid Function Tests: No results for input(s): TSH, T4TOTAL, FREET4, T3FREE, THYROIDAB in the last 72 hours. Anemia Panel: No results for input(s): VITAMINB12, FOLATE, FERRITIN, TIBC, IRON, RETICCTPCT in the last 72 hours. Sepsis Labs: No results for input(s): PROCALCITON, LATICACIDVEN in the last 168 hours.  Recent Results (from the past 240 hour(s))  Resp Panel by RT-PCR (Flu A&B, Covid) Nasopharyngeal Swab     Status: None   Collection Time: 09/05/20  8:04 PM    Specimen: Nasopharyngeal Swab; Nasopharyngeal(NP) swabs in vial transport medium  Result Value Ref Range Status   SARS Coronavirus 2 by RT PCR NEGATIVE NEGATIVE Final    Comment: (NOTE) SARS-CoV-2 target nucleic acids are NOT DETECTED.  The SARS-CoV-2 RNA is generally detectable in upper respiratory specimens during the acute phase of infection. The lowest concentration of SARS-CoV-2 viral copies this assay can detect is 138 copies/mL. A negative result does not preclude SARS-Cov-2 infection and should not be used as the sole basis for treatment or other patient management decisions. A negative result may occur with  improper specimen collection/handling, submission of specimen other than nasopharyngeal swab, presence of viral mutation(s) within the areas targeted by this assay, and inadequate number of viral copies(<138 copies/mL). A negative  result must be combined with clinical observations, patient history, and epidemiological information. The expected result is Negative.  Fact Sheet for Patients:  EntrepreneurPulse.com.au  Fact Sheet for Healthcare Providers:  IncredibleEmployment.be  This test is no t yet approved or cleared by the [place] FDA and  has been authorized for detection and/or diagnosis of SARS-CoV-2 by FDA under an Emergency Use Authorization (EUA). This EUA will remain  in effect (meaning this test can be used) for the duration of the COVID-19 declaration under Section 564(b)(1) of the Act, 21 U.S.C.section 360bbb-3(b)(1), unless the authorization is terminated  or revoked sooner.       Influenza A by PCR NEGATIVE NEGATIVE Final   Influenza B by PCR NEGATIVE NEGATIVE Final    Comment: (NOTE) The Xpert Xpress SARS-CoV-2/FLU/RSV plus assay is intended as an aid in the diagnosis of influenza from Nasopharyngeal swab specimens and should not be used as a sole basis for treatment. Nasal washings and aspirates are  unacceptable for Xpert Xpress SARS-CoV-2/FLU/RSV testing.  Fact Sheet for Patients: EntrepreneurPulse.com.au  Fact Sheet for Healthcare Providers: IncredibleEmployment.be  This test is not yet approved or cleared by the [place] FDA and has been authorized for detection and/or diagnosis of SARS-CoV-2 by FDA under an Emergency Use Authorization (EUA). This EUA will remain in effect (meaning this test can be used) for the duration of the COVID-19 declaration under Section 564(b)(1) of the Act, 21 U.S.C. section 360bbb-3(b)(1), unless the authorization is terminated or revoked.  Performed at [facility], [place] [address], [place], [place] [address]      Radiology Studies: No results found.  Scheduled Meds: . amLODipine  10 mg Oral Daily  . apixaban  5 mg Oral BID  . fluticasone furoate-vilanterol  1 puff Inhalation Daily  . furosemide  40 mg Oral Daily  . montelukast  10 mg Oral QHS  . pantoprazole  40 mg Oral Daily  . triamcinolone  1 spray Nasal Daily   Continuous Infusions:   LOS: 0 days   Marylu Lund, MD [facility] Pager On Amion  If 7PM-7AM, please contact night-coverage 09/07/2020, 3:35 PM

## 2020-09-07 NOTE — Progress Notes (Signed)
Physical Therapy Treatment Patient Details Name: Shane [name] MRN: [id number] DOB: May 06, 1946 Today's Date: 09/07/2020    History of Present Illness Shane [name] is a 75 y.o. male who was found stuck in his SUV by EMS due significant R knee pain. Pt was admitted 12/21 for bilat DVTs and was d/c'd on apixaban, but never followed up or refilled script PMH: hypertension, GERD, degenerative joint disease, DVTs no longer on apixaban, housing insecurity (pt does not admit to this), has been living in an SUV and reportedly has not been able to get out due to R knee pain.    PT Comments    Pt received in supine, agreeable to therapy session after premedication and with good participation and tolerance for mobility. Pt slow to initiate/perform mobility tasks and needs increased assist for bed mobility this date compared with previous session. Pt performed multiple sit<>stand transfers, supine/seated exercises and progressed gait distance using RW with min guard at most needed for OOB mobility tasks, gait distance limited due to evolving symptomatic orthostatic hypotension with standing. Pt continues to benefit from PT services to progress toward functional mobility goals. Continue to recommend SNF. Orthostatic BPs Sitting 137/80 (98)  Standing 110/78 (89)  SpO2 99% on RA and HR WNL, SBP 115 on second standing assessment.     Follow Up Recommendations  SNF;Supervision/Assistance - 24 hour     Equipment Recommendations  Rolling walker with 5" wheels    Recommendations for Other Services       Precautions / Restrictions Precautions Precautions: Fall Precaution Comments: bilat LE DVTs - per Dr. Wyline Copas they're chronic and "matured" so there is no risk of breaking off, pt with extremely poor bilat foot hygiene, at times suggestive/inapprop. toward male staff Restrictions Weight Bearing Restrictions: No    Mobility  Bed Mobility Overal bed mobility: Needs Assistance Bed Mobility:  Supine to Sit;Sit to Supine     Supine to sit: Min assist;HOB elevated Sit to supine: Supervision   General bed mobility comments: HOB elevated, pt able to bring LEs around and off EOB, needed cues for use of bed rail and leg placement, pt initially needed minA for elevating trunk despite HOB fully raised and using rail, on second attempt needed min guard only; question decreased motivation vs confusion as he was Supervision to return to supine and flat HOB from sitting    Transfers Overall transfer level: Needs assistance Equipment used: Rolling walker (2 wheeled) Transfers: Sit to/from Stand Sit to Stand: Min guard         General transfer comment: min guard for safety, verbal cues for hand placement, increased time, x7 total reps from EOB; evolving dizziness reported, see vitals  Ambulation/Gait Ambulation/Gait assistance: Min guard Gait Distance (Feet): 50 Feet Assistive device: Rolling walker (2 wheeled) Gait Pattern/deviations: Decreased stride length;Step-through pattern;Decreased weight shift to right Gait velocity: slow   General Gait Details: min guard ambulation, pt at times lifting RW when turning needs cues for AD mgmt throughout, pt slow to initiate/perform gait task; evolving dizziness, see vitals; only light UE reliance on RW but safer when using it   Stairs             Wheelchair Mobility    Modified Rankin (Stroke Patients Only)       Balance Overall balance assessment: Needs assistance Sitting-balance support: Feet supported;No upper extremity supported Sitting balance-Leahy Scale: Good     Standing balance support: Bilateral upper extremity supported Standing balance-Leahy Scale: Poor Standing balance comment: light UE  reliance on RW                            Cognition Arousal/Alertness: Awake/alert Behavior During Therapy: Flat affect Overall Cognitive Status: No family/caregiver present to determine baseline cognitive  functioning                                 General Comments: pt A&Ox4, unknown baseline cognition however pt with very delayed processing and response time. Able to follow commands but with increased time and difficulty sequencing for bed mobility. Tends to flirt with male staff members per nsg.      Exercises General Exercises - Lower Extremity Ankle Circles/Pumps: AROM;Both;20 reps;Supine Short Arc Quad: AROM;Both;10 reps;Supine Heel Slides: AROM;Both;10 reps;Supine Hip ABduction/ADduction: AROM;Both;20 reps;Supine Straight Leg Raises: AAROM;Both;10 reps;Supine Hip Flexion/Marching: AROM;Both;20 reps;Supine;Seated    General Comments General comments (skin integrity, edema, etc.): pt c/o itching in back, very appreciative of lotion applied to back (but a little overly expressive), pt asking for back scratcher, NT plans to assist him to bathe himself after session      Pertinent Vitals/Pain Pain Assessment: Faces Faces Pain Scale: Hurts little more Pain Location: R knee with heel slides/increased flexion Pain Descriptors / Indicators: Aching;Discomfort;Grimacing Pain Intervention(s): Monitored during session;Premedicated before session;Repositioned    Home Living                      Prior Function            PT Goals (current goals can now be found in the care plan section) Acute Rehab PT Goals Patient Stated Goal: home PT Goal Formulation: With patient Time For Goal Achievement: 09/20/20 Potential to Achieve Goals: Good    Frequency    Min 3X/week      PT Plan Current plan remains appropriate    Co-evaluation              AM-PAC PT "6 Clicks" Mobility   Outcome Measure  Help needed turning from your back to your side while in a flat bed without using bedrails?: None Help needed moving from lying on your back to sitting on the side of a flat bed without using bedrails?: None Help needed moving to and from a bed to a chair  (including a wheelchair)?: A Little Help needed standing up from a chair using your arms (e.g., wheelchair or bedside chair)?: A Little Help needed to walk in hospital room?: A Little Help needed climbing 3-5 steps with a railing? : A Lot 6 Click Score: 19    End of Session Equipment Utilized During Treatment: Gait belt Activity Tolerance: Patient tolerated treatment well Patient left: in bed;with bed alarm set;with call bell/phone within reach (bed in chair position, 4 rails up per pt request, alarm on for safety 2/2 cognition) Nurse Communication: Mobility status;Other (comment) (pt can be mildly inappropriate toward male staff at times, good to have +2 in room for staff safety; orthostatic) PT Visit Diagnosis: Muscle weakness (generalized) (M62.81);Unsteadiness on feet (R26.81);Difficulty in walking, not elsewhere classified (R26.2)     Time: 4801-6553 PT Time Calculation (min) (ACUTE ONLY): 46 min  Charges:  $Gait Training: 8-22 mins $Therapeutic Exercise: 8-22 mins $Therapeutic Activity: 8-22 mins                     Chetara Kropp P., PTA Acute Rehabilitation Services Pager: [phone] Office:  [place] 09/07/2020, 4:02 PM

## 2020-09-07 NOTE — Progress Notes (Signed)
Mobility Specialist - Progress Note   09/07/20 1716  Mobility  Activity Ambulated in hall  Level of Assistance Standby assist, set-up cues, supervision of patient - no hands on  Assistive Device Four wheel walker  Distance Ambulated (ft) 50 ft  Mobility Response Tolerated fair  Mobility performed by Mobility specialist  $Mobility charge 1 Mobility   Pre-mobility: 59 HR, 93% SpO2 Post-mobility: 60 HR, 130/80 BP, 94% SpO2  Asked to see pt by PTA. Distance limited by nausea and dizziness. He states it began to resolve once sitting down. Pt back in bed after walk.   Pricilla Handler Mobility Specialist Mobility Specialist Phone: [phone]

## 2020-09-08 DIAGNOSIS — D6859 Other primary thrombophilia: Secondary | ICD-10-CM | POA: Diagnosis present

## 2020-09-08 DIAGNOSIS — I87023 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: Secondary | ICD-10-CM | POA: Diagnosis present

## 2020-09-08 DIAGNOSIS — R7303 Prediabetes: Secondary | ICD-10-CM | POA: Diagnosis present

## 2020-09-08 DIAGNOSIS — I82531 Chronic embolism and thrombosis of right popliteal vein: Secondary | ICD-10-CM | POA: Diagnosis present

## 2020-09-08 DIAGNOSIS — I82541 Chronic embolism and thrombosis of right tibial vein: Secondary | ICD-10-CM | POA: Diagnosis present

## 2020-09-08 DIAGNOSIS — Z91128 Patient's intentional underdosing of medication regimen for other reason: Secondary | ICD-10-CM | POA: Diagnosis not present

## 2020-09-08 DIAGNOSIS — G8929 Other chronic pain: Secondary | ICD-10-CM | POA: Diagnosis present

## 2020-09-08 DIAGNOSIS — Z833 Family history of diabetes mellitus: Secondary | ICD-10-CM | POA: Diagnosis not present

## 2020-09-08 DIAGNOSIS — I82409 Acute embolism and thrombosis of unspecified deep veins of unspecified lower extremity: Secondary | ICD-10-CM | POA: Diagnosis present

## 2020-09-08 DIAGNOSIS — M1711 Unilateral primary osteoarthritis, right knee: Secondary | ICD-10-CM | POA: Diagnosis present

## 2020-09-08 DIAGNOSIS — M549 Dorsalgia, unspecified: Secondary | ICD-10-CM | POA: Diagnosis present

## 2020-09-08 DIAGNOSIS — Z87891 Personal history of nicotine dependence: Secondary | ICD-10-CM | POA: Diagnosis not present

## 2020-09-08 DIAGNOSIS — Z86010 Personal history of colonic polyps: Secondary | ICD-10-CM | POA: Diagnosis not present

## 2020-09-08 DIAGNOSIS — I82723 Chronic embolism and thrombosis of deep veins of upper extremity, bilateral: Secondary | ICD-10-CM | POA: Diagnosis not present

## 2020-09-08 DIAGNOSIS — Z5902 Unsheltered homelessness: Secondary | ICD-10-CM | POA: Diagnosis not present

## 2020-09-08 DIAGNOSIS — M79604 Pain in right leg: Secondary | ICD-10-CM | POA: Diagnosis not present

## 2020-09-08 DIAGNOSIS — Z20822 Contact with and (suspected) exposure to covid-19: Secondary | ICD-10-CM | POA: Diagnosis present

## 2020-09-08 DIAGNOSIS — J45909 Unspecified asthma, uncomplicated: Secondary | ICD-10-CM | POA: Diagnosis present

## 2020-09-08 DIAGNOSIS — M79605 Pain in left leg: Secondary | ICD-10-CM | POA: Diagnosis not present

## 2020-09-08 DIAGNOSIS — N1831 Chronic kidney disease, stage 3a: Secondary | ICD-10-CM | POA: Diagnosis present

## 2020-09-08 DIAGNOSIS — Z9119 Patient's noncompliance with other medical treatment and regimen: Secondary | ICD-10-CM | POA: Diagnosis not present

## 2020-09-08 DIAGNOSIS — Z7951 Long term (current) use of inhaled steroids: Secondary | ICD-10-CM | POA: Diagnosis not present

## 2020-09-08 DIAGNOSIS — Z66 Do not resuscitate: Secondary | ICD-10-CM | POA: Diagnosis present

## 2020-09-08 DIAGNOSIS — I129 Hypertensive chronic kidney disease with stage 1 through stage 4 chronic kidney disease, or unspecified chronic kidney disease: Secondary | ICD-10-CM | POA: Diagnosis present

## 2020-09-08 DIAGNOSIS — T45516A Underdosing of anticoagulants, initial encounter: Secondary | ICD-10-CM | POA: Diagnosis present

## 2020-09-08 DIAGNOSIS — I82511 Chronic embolism and thrombosis of right femoral vein: Secondary | ICD-10-CM | POA: Diagnosis present

## 2020-09-08 DIAGNOSIS — Z79899 Other long term (current) drug therapy: Secondary | ICD-10-CM | POA: Diagnosis not present

## 2020-09-08 DIAGNOSIS — M25561 Pain in right knee: Secondary | ICD-10-CM | POA: Diagnosis not present

## 2020-09-08 DIAGNOSIS — G4733 Obstructive sleep apnea (adult) (pediatric): Secondary | ICD-10-CM | POA: Diagnosis present

## 2020-09-08 LAB — COMPREHENSIVE METABOLIC PANEL
ALT: 8 U/L (ref 0–44)
AST: 13 U/L — ABNORMAL LOW (ref 15–41)
Albumin: 3.5 g/dL (ref 3.5–5.0)
Alkaline Phosphatase: 70 U/L (ref 38–126)
Anion gap: 9 (ref 5–15)
BUN: 21 mg/dL (ref 8–23)
CO2: 27 mmol/L (ref 22–32)
Calcium: 8.9 mg/dL (ref 8.9–10.3)
Chloride: 99 mmol/L (ref 98–111)
Creatinine, Ser: 1.46 mg/dL — ABNORMAL HIGH (ref 0.61–1.24)
GFR, Estimated: 50 mL/min — ABNORMAL LOW (ref >60)
Glucose, Bld: 104 mg/dL — ABNORMAL HIGH (ref 70–99)
Potassium: 4.3 mmol/L (ref 3.5–5.1)
Sodium: 135 mmol/L (ref 135–145)
Total Bilirubin: 0.5 mg/dL (ref 0.3–1.2)
Total Protein: 7 g/dL (ref 6.5–8.1)

## 2020-09-08 MED ORDER — TRAZODONE HCL 100 MG PO TABS: 100.0000 mg | ORAL_TABLET | Freq: Every evening | ORAL | Status: DC | PRN

## 2020-09-08 NOTE — Progress Notes (Signed)
Physical Therapy Treatment Patient Details Name: Shane [name] MRN: [id number] DOB: 1945-07-28 Today's Date: 09/08/2020    History of Present Illness Shane [name] is a 75 y.o. male who was found stuck in his SUV by EMS due significant R knee pain. Pt was admitted 12/21 for bilat DVTs and was d/c'd on apixaban, but never followed up or refilled script PMH: hypertension, GERD, degenerative joint disease, DVTs no longer on apixaban, housing insecurity (pt does not admit to this), has been living in an SUV and reportedly has not been able to get out due to R knee pain.    PT Comments    Pt is slowing progressing toward goals.  He is mildly limited by R Leg/knee pain, needs a little encouragement and education of the importance of mobility for his overall health, but pt is willing to participate.  Emphasis on warm up LE ROM exercise, transitions to EOB, sit to stand, monitoring of BP's and progression of gait with the RW.    Follow Up Recommendations  SNF;Supervision/Assistance - 24 hour     Equipment Recommendations  Rolling walker with 5" wheels    Recommendations for Other Services       Precautions / Restrictions Precautions Precautions: Fall Precaution Comments: bilat LE DVTs - per Dr. Wyline Copas they're chronic and "matured" so there is no risk of breaking off, pt with extremely poor bilat foot hygiene, at times suggestive/inapprop. toward male staff    Mobility  Bed Mobility Overal bed mobility: Needs Assistance Bed Mobility: Supine to Sit     Supine to sit: Min guard     General bed mobility comments: minimal HOB elevation.  Pt through legs off to build momentum and came up relatively easily.    Transfers Overall transfer level: Needs assistance Equipment used: Rolling walker (2 wheeled) Transfers: Sit to/from Stand Sit to Stand: Min guard         General transfer comment: appropriate use of hands to assist up from mildly elevated  bed.  Ambulation/Gait Ambulation/Gait assistance: Min guard Gait Distance (Feet): 180 Feet Assistive device: Rolling walker (2 wheeled) Gait Pattern/deviations: Step-through pattern;Decreased stride length Gait velocity: slower Gait velocity interpretation: <1.8 ft/sec, indicate of risk for recurrent falls General Gait Details: generally steady without antalgic pattern despite reports of significant R knee pain.  moderatly heavy use of the RW.  Report of dizziness that didn't worsen with time up.   Stairs             Wheelchair Mobility    Modified Rankin (Stroke Patients Only)       Balance Overall balance assessment: Needs assistance Sitting-balance support: No upper extremity supported;Feet supported Sitting balance-Leahy Scale: Good     Standing balance support: Bilateral upper extremity supported Standing balance-Leahy Scale: Poor Standing balance comment: light to moderate reliance on RW due to pain                            Cognition Arousal/Alertness: Awake/alert Behavior During Therapy: WFL for tasks assessed/performed Overall Cognitive Status: No family/caregiver present to determine baseline cognitive functioning                                 General Comments: slowed but functional processing.      Exercises Other Exercises Other Exercises: warm up hip/knee A/Resistive ROM bil.  prior to mobility.    General Comments General  comments (skin integrity, edema, etc.): Inital BP in standing after ~ 2 min 114/62 (82), BP after gait in sitting 129/76,  Sats overall 94% and RA      Pertinent Vitals/Pain Pain Assessment: 0-10 Pain Score: 7  Pain Location: R knee Pain Descriptors / Indicators: Discomfort;Grimacing;Guarding Pain Intervention(s): Monitored during session    Home Living                      Prior Function            PT Goals (current goals can now be found in the care plan section) Acute Rehab PT  Goals Patient Stated Goal: home PT Goal Formulation: With patient Time For Goal Achievement: 09/20/20 Potential to Achieve Goals: Good Progress towards PT goals: Progressing toward goals    Frequency    Min 3X/week      PT Plan Current plan remains appropriate    Co-evaluation              AM-PAC PT "6 Clicks" Mobility   Outcome Measure  Help needed turning from your back to your side while in a flat bed without using bedrails?: None Help needed moving from lying on your back to sitting on the side of a flat bed without using bedrails?: None Help needed moving to and from a bed to a chair (including a wheelchair)?: A Little Help needed standing up from a chair using your arms (e.g., wheelchair or bedside chair)?: A Little Help needed to walk in hospital room?: A Little Help needed climbing 3-5 steps with a railing? : A Lot 6 Click Score: 19    End of Session   Activity Tolerance: Patient tolerated treatment well;Patient limited by pain Patient left: in bed;with call bell/phone within reach;with bed alarm set;Other (comment) (sitting EOB eating lunch) Nurse Communication: Mobility status PT Visit Diagnosis: Other abnormalities of gait and mobility (R26.89);Pain Pain - Right/Left: Right Pain - part of body: Knee;Leg     Time: 1610-9604 PT Time Calculation (min) (ACUTE ONLY): 29 min  Charges:  $Gait Training: 8-22 mins $Therapeutic Activity: 8-22 mins                     09/08/2020  Ginger Carne., PT Acute Rehabilitation Services [phone]  (pager) [phone]  (office)   Tessie Fass Jaiveer Panas 09/08/2020, 1:50 PM

## 2020-09-08 NOTE — TOC Progression Note (Signed)
Transition of Care [facility]) - Progression Note    Patient Details  Name: WINDEL KEZIAH MRN: [id number] Date of Birth: 1946-06-19  Transition [facility]) CM/SW [place], [place] Phone Number: 09/08/2020, 4:42 PM  Clinical Narrative:     HTA Insurance remains pending   Thurmond Butts, MSW, LCSW Clinical Social Worker   Expected Discharge Plan: [place] Barriers to Discharge: Insurance Authorization,Continued Medical Work up,SNF Pending bed offer  Expected Discharge Plan and Services Expected Discharge Plan: [place] In-house Referral: Clinical Social Work     Living arrangements for the past 2 months: [employer]                                       Social Determinants of Health (SDOH) Interventions    Readmission Risk Interventions No flowsheet data found.

## 2020-09-08 NOTE — TOC Progression Note (Signed)
Transition of Care (TOC) - Progression Note  Marvetta Gibbons RN, BSN Transitions of Care Unit 4E- RN Case Manager See Treatment Team for direct phone #    Patient Details  Name: JERRED ZAREMBA MRN: [id number] Date of Birth: 1946/05/17  Transition [facility] Care [facility]) CM/SW Contact  Dahlia Client, Romeo Rabon, RN Phone Number: 09/08/2020, 11:36 AM  Clinical Narrative:    Call made back to HTA to see about eligibility for services post discharge- spoke with [facility] with Provider services Line [phone])- ref # [id number]. Per TC pt is not eligible for any home assistance or personal concierge services. She also checked to see if pt was eligible under their [employer] or [employer] programs which pt is not. Pt apparently has basic Health Coverage under the HTA plan.  CSW continues to follow for SNF placement   Expected Discharge Plan: [place] Barriers to Discharge: [place] Work up,SNF Pending bed offer  Expected Discharge Plan and Services Expected Discharge Plan: [place] In-house Referral: Clinical Social Work     Living arrangements for the past 2 months: [place]                                       Social Determinants of Health (SDOH) Interventions    Readmission Risk Interventions No flowsheet data found.

## 2020-09-08 NOTE — Progress Notes (Addendum)
PROGRESS NOTE    Shane [name]  SVX:[id number] DOB: 11/08/1945 DOA: 09/05/2020 PCP: [name], MD    Brief Narrative:   75 y.o. male with medical history significant of essential hypertension, GERD, degenerative joint disease, DVTs no longer on apixaban, housing insecurity although patient does not admit, has been living in an SUV and reportedly has not been able to get out due to right knee pain. Pt was last hospialized in 12/21, at which time pt was found to have B LE DVT's and discharged on starter pack of eliquis. 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 Pt reported some improvement with prednisone and given one dose of IV solumedrol -Placement pending  Chronic DVTs of bilateral legs - Pt has failed to continue taking apixaban after completing starter pack from 12/21. - TOC consulted to assist with medication - Continue apixaban 5 mg BID - he has no signs or symptoms of PE  Essential hypertension - resumed home norvasc -cont to titrate bp meds as tolerated  OSA - will offer CPAP while in hospital  Housing insecurity - Concerns for homelessness noted -TOC  following -PT recs for SNF, TOC pending  Moderate right knee effusion and severe right knee pain - xray with no fracture findings;  - severe osteoarthritis and inability to bare weight - uric acid is elevated at 10.1  - Given brief course of steroids  Noncompliance  - TOC consulted to investigate home situation  -Dispo plan for SNF noted - Needs PCP follow up and assistance with medication.   Stage 3a CKD - stable  DVT prophylaxis: eliquis Code Status: DNR Family Communication: Pt in room, family not at bedside  Status is: Observation  The patient will require care spanning > 2 midnights and should be moved to inpatient because: Unsafe d/c plan  Dispo: The patient is from: Home              Anticipated d/c is to: SNF              Patient currently is not medically stable to d/c.   Difficult to place patient No   Consultants:     Procedures:     Antimicrobials: Anti-infectives (From admission, onward)   None      Subjective: Feels tired, reports lack of sleep at night. States historically Lorrin Mais is not effective  Objective: Vitals:   09/08/20 0252 09/08/20 0759 09/08/20 1144 09/08/20 1616  BP: 124/75 138/79 108/68 97/66  Pulse: 62 (!) 56 60 61  Resp: 18 14 12 17   Temp: 98.2 F (36.8 C) 98.6 F (37 C) 98 F (36.7 C) 98.3 F (36.8 C)  TempSrc: Oral  Oral Oral Oral  SpO2: 94% 96% 97% 97%  Weight:      Height:        Intake/Output Summary (Last 24 hours) at 09/08/2020 1721 Last data filed at 09/08/2020 1007 Gross per 24 hour  Intake --  Output 725 ml  Net -725 ml   Filed Weights   09/05/20 1413  Weight: 122.5 kg    Examination: General exam: Conversant, in no acute distress Respiratory system: normal chest rise, clear, no audible wheezing Cardiovascular system: regular rhythm, s1-s2 Gastrointestinal system: Nondistended, nontender, pos BS Central nervous system: No seizures, no tremors Extremities: No cyanosis, no joint deformities Skin: No  rashes, no pallor Psychiatry: Affect normal // no auditory hallucinations   Data Reviewed: I have personally reviewed following labs and imaging studies  CBC: Recent Labs  Lab 09/05/20 1430 09/07/20 0105  WBC 8.0 9.9  NEUTROABS 5.3  --   HGB 11.4* 11.6*  HCT 37.2* 35.6*  MCV 64.6* 61.8*  PLT 155 803   Basic Metabolic Panel: Recent Labs  Lab 09/05/20 1430 09/06/20 0041 09/07/20 0105 09/08/20 0105  NA 136 134* 134* 135  K 4.6 4.1 3.7 4.3  CL 105 104 100 99  CO2 22 23 23 27   GLUCOSE 105* 147* 105* 104*  BUN 15 13 16 21   CREATININE 1.34* 1.28* 1.35* 1.46*  CALCIUM 9.1 9.0 9.0 8.9   GFR: Estimated Creatinine Clearance: 59.8 mL/min (A) (by C-G formula based on SCr of 1.46 mg/dL (H)). Liver Function Tests: Recent Labs  Lab 09/05/20 1430 09/07/20 0105 09/08/20 0105  AST 23 16 13*  ALT 10 8 8   ALKPHOS 74 75 70  BILITOT 0.9 0.9 0.5  PROT 7.2 7.1 7.0  ALBUMIN 3.8 3.6 3.5   No results for input(s): LIPASE, AMYLASE in the last 168 hours. No results for input(s): AMMONIA in the last 168 hours. Coagulation Profile: No results for input(s): INR, PROTIME in the last 168 hours. Cardiac Enzymes: No results for input(s): CKTOTAL, CKMB, CKMBINDEX, TROPONINI in the last 168 hours. BNP (last 3 results) No results for input(s): PROBNP in the last 8760 hours. HbA1C: No results for input(s): HGBA1C in the last 72 hours. CBG: Recent Labs  Lab 09/07/20 2115  GLUCAP 214*   Lipid Profile: No results for input(s): CHOL, HDL, LDLCALC, TRIG, CHOLHDL, LDLDIRECT in the last 72 hours. Thyroid Function Tests: No results for input(s): TSH, T4TOTAL, FREET4, T3FREE, THYROIDAB in the last 72 hours. Anemia Panel: No results for input(s): VITAMINB12, FOLATE, FERRITIN, TIBC, IRON, RETICCTPCT in the last 72 hours. Sepsis Labs: No results for input(s): PROCALCITON, LATICACIDVEN in the last 168 hours.  Recent Results (from the past 240 hour(s))  Resp Panel by RT-PCR (Flu A&B, Covid)  Nasopharyngeal Swab     Status: None   Collection Time: 09/05/20  8:04 PM   Specimen: Nasopharyngeal Swab; Nasopharyngeal(NP) swabs in vial transport medium  Result Value Ref Range Status   SARS Coronavirus 2 by RT PCR NEGATIVE NEGATIVE Final    Comment: (NOTE) SARS-CoV-2 target nucleic acids are NOT DETECTED.  The SARS-CoV-2 RNA is generally detectable in upper respiratory specimens during the acute phase of infection. The lowest concentration of SARS-CoV-2 viral copies this assay can detect is 138 copies/mL. A negative result does not preclude SARS-Cov-2 infection and should not be used as the sole basis for treatment or other patient management decisions. A negative result may occur with  improper specimen collection/handling, submission of specimen other than nasopharyngeal swab, presence of viral mutation(s) within  the areas targeted by this assay, and inadequate number of viral copies(<138 copies/mL). A negative result must be combined with clinical observations, patient history, and epidemiological information. The expected result is Negative.  Fact Sheet for Patients:  EntrepreneurPulse.com.au  Fact Sheet for Healthcare Providers:  IncredibleEmployment.be  This test is no t yet approved or cleared by the [place] FDA and  has been authorized for detection and/or diagnosis of SARS-CoV-2 by FDA under an Emergency Use Authorization (EUA). This EUA will remain  in effect (meaning this test can be used) for the duration of the COVID-19 declaration under Section 564(b)(1) of the Act, 21 U.S.C.section 360bbb-3(b)(1), unless the authorization is terminated  or revoked sooner.       Influenza A by PCR NEGATIVE NEGATIVE Final   Influenza B by PCR NEGATIVE NEGATIVE Final    Comment: (NOTE) The Xpert Xpress SARS-CoV-2/FLU/RSV plus assay is intended as an aid in the diagnosis of influenza from Nasopharyngeal swab specimens and should not be  used as a sole basis for treatment. Nasal washings and aspirates are unacceptable for Xpert Xpress SARS-CoV-2/FLU/RSV testing.  Fact Sheet for Patients: EntrepreneurPulse.com.au  Fact Sheet for Healthcare Providers: IncredibleEmployment.be  This test is not yet approved or cleared by the [place] FDA and has been authorized for detection and/or diagnosis of SARS-CoV-2 by FDA under an Emergency Use Authorization (EUA). This EUA will remain in effect (meaning this test can be used) for the duration of the COVID-19 declaration under Section 564(b)(1) of the Act, 21 U.S.C. section 360bbb-3(b)(1), unless the authorization is terminated or revoked.  Performed at [facility], [place] [address] [place], [place] [address]      Radiology Studies: No results found.  Scheduled Meds: . amLODipine  10 mg Oral Daily  . apixaban  5 mg Oral BID  . fluticasone furoate-vilanterol  1 puff Inhalation Daily  . furosemide  40 mg Oral Daily  . montelukast  10 mg Oral QHS  . pantoprazole  40 mg Oral Daily  . triamcinolone  1 spray Nasal Daily   Continuous Infusions:   LOS: 0 days   Marylu Lund, MD [facility] Pager On Amion  If 7PM-7AM, please contact night-coverage 09/08/2020, 5:21 PM

## 2020-09-08 NOTE — Progress Notes (Signed)
Pt refused to wear CPAP he said he cannot sleep well with it and feels uncomfortable.

## 2020-09-09 DIAGNOSIS — R5381 Other malaise: Secondary | ICD-10-CM | POA: Diagnosis not present

## 2020-09-09 DIAGNOSIS — M79604 Pain in right leg: Secondary | ICD-10-CM | POA: Diagnosis not present

## 2020-09-09 DIAGNOSIS — M79605 Pain in left leg: Secondary | ICD-10-CM | POA: Diagnosis not present

## 2020-09-09 DIAGNOSIS — I129 Hypertensive chronic kidney disease with stage 1 through stage 4 chronic kidney disease, or unspecified chronic kidney disease: Secondary | ICD-10-CM | POA: Diagnosis not present

## 2020-09-09 DIAGNOSIS — M25462 Effusion, left knee: Secondary | ICD-10-CM | POA: Diagnosis not present

## 2020-09-09 DIAGNOSIS — Z87891 Personal history of nicotine dependence: Secondary | ICD-10-CM | POA: Diagnosis not present

## 2020-09-09 DIAGNOSIS — D6869 Other thrombophilia: Secondary | ICD-10-CM | POA: Diagnosis not present

## 2020-09-09 DIAGNOSIS — I708 Atherosclerosis of other arteries: Secondary | ICD-10-CM | POA: Diagnosis not present

## 2020-09-09 DIAGNOSIS — M25561 Pain in right knee: Secondary | ICD-10-CM | POA: Diagnosis not present

## 2020-09-09 DIAGNOSIS — M1712 Unilateral primary osteoarthritis, left knee: Secondary | ICD-10-CM | POA: Diagnosis not present

## 2020-09-09 DIAGNOSIS — Z7901 Long term (current) use of anticoagulants: Secondary | ICD-10-CM | POA: Diagnosis not present

## 2020-09-09 DIAGNOSIS — Z7401 Bed confinement status: Secondary | ICD-10-CM | POA: Diagnosis not present

## 2020-09-09 DIAGNOSIS — R0902 Hypoxemia: Secondary | ICD-10-CM | POA: Diagnosis not present

## 2020-09-09 DIAGNOSIS — M2550 Pain in unspecified joint: Secondary | ICD-10-CM | POA: Diagnosis not present

## 2020-09-09 DIAGNOSIS — R488 Other symbolic dysfunctions: Secondary | ICD-10-CM | POA: Diagnosis not present

## 2020-09-09 DIAGNOSIS — I82511 Chronic embolism and thrombosis of right femoral vein: Secondary | ICD-10-CM | POA: Diagnosis not present

## 2020-09-09 DIAGNOSIS — R404 Transient alteration of awareness: Secondary | ICD-10-CM | POA: Diagnosis not present

## 2020-09-09 DIAGNOSIS — I82723 Chronic embolism and thrombosis of deep veins of upper extremity, bilateral: Secondary | ICD-10-CM | POA: Diagnosis not present

## 2020-09-09 DIAGNOSIS — I10 Essential (primary) hypertension: Secondary | ICD-10-CM | POA: Diagnosis not present

## 2020-09-09 DIAGNOSIS — R52 Pain, unspecified: Secondary | ICD-10-CM | POA: Diagnosis not present

## 2020-09-09 DIAGNOSIS — Z79899 Other long term (current) drug therapy: Secondary | ICD-10-CM | POA: Diagnosis not present

## 2020-09-09 DIAGNOSIS — M25562 Pain in left knee: Secondary | ICD-10-CM | POA: Diagnosis not present

## 2020-09-09 DIAGNOSIS — R4182 Altered mental status, unspecified: Secondary | ICD-10-CM | POA: Diagnosis not present

## 2020-09-09 DIAGNOSIS — R2689 Other abnormalities of gait and mobility: Secondary | ICD-10-CM | POA: Diagnosis not present

## 2020-09-09 DIAGNOSIS — N1831 Chronic kidney disease, stage 3a: Secondary | ICD-10-CM | POA: Diagnosis not present

## 2020-09-09 DIAGNOSIS — M7989 Other specified soft tissue disorders: Secondary | ICD-10-CM | POA: Diagnosis not present

## 2020-09-09 DIAGNOSIS — R2681 Unsteadiness on feet: Secondary | ICD-10-CM | POA: Diagnosis not present

## 2020-09-09 DIAGNOSIS — J4540 Moderate persistent asthma, uncomplicated: Secondary | ICD-10-CM | POA: Diagnosis not present

## 2020-09-09 DIAGNOSIS — Z7951 Long term (current) use of inhaled steroids: Secondary | ICD-10-CM | POA: Diagnosis not present

## 2020-09-09 DIAGNOSIS — R278 Other lack of coordination: Secondary | ICD-10-CM | POA: Diagnosis not present

## 2020-09-09 DIAGNOSIS — R410 Disorientation, unspecified: Secondary | ICD-10-CM | POA: Diagnosis not present

## 2020-09-09 DIAGNOSIS — M6281 Muscle weakness (generalized): Secondary | ICD-10-CM | POA: Diagnosis not present

## 2020-09-09 DIAGNOSIS — M13869 Other specified arthritis, unspecified knee: Secondary | ICD-10-CM | POA: Diagnosis not present

## 2020-09-09 DIAGNOSIS — I517 Cardiomegaly: Secondary | ICD-10-CM | POA: Diagnosis not present

## 2020-09-09 LAB — COMPREHENSIVE METABOLIC PANEL
ALT: 7 U/L (ref 0–44)
AST: 11 U/L — ABNORMAL LOW (ref 15–41)
Albumin: 3.3 g/dL — ABNORMAL LOW (ref 3.5–5.0)
Alkaline Phosphatase: 63 U/L (ref 38–126)
Anion gap: 9 (ref 5–15)
BUN: 23 mg/dL (ref 8–23)
CO2: 26 mmol/L (ref 22–32)
Calcium: 8.6 mg/dL — ABNORMAL LOW (ref 8.9–10.3)
Chloride: 101 mmol/L (ref 98–111)
Creatinine, Ser: 1.43 mg/dL — ABNORMAL HIGH (ref 0.61–1.24)
GFR, Estimated: 51 mL/min — ABNORMAL LOW (ref >60)
Glucose, Bld: 92 mg/dL (ref 70–99)
Potassium: 3.5 mmol/L (ref 3.5–5.1)
Sodium: 136 mmol/L (ref 135–145)
Total Bilirubin: 0.8 mg/dL (ref 0.3–1.2)
Total Protein: 6.4 g/dL — ABNORMAL LOW (ref 6.5–8.1)

## 2020-09-09 LAB — CBC
HCT: 35.6 % — ABNORMAL LOW (ref 39.0–52.0)
Hemoglobin: 11.1 g/dL — ABNORMAL LOW (ref 13.0–17.0)
MCH: 19.6 pg — ABNORMAL LOW (ref 26.0–34.0)
MCHC: 31.2 g/dL (ref 30.0–36.0)
MCV: 62.9 fL — ABNORMAL LOW (ref 80.0–100.0)
Platelets: 184 10*3/uL (ref 150–400)
RBC: 5.66 MIL/uL (ref 4.22–5.81)
RDW: 16.9 % — ABNORMAL HIGH (ref 11.5–15.5)
WBC: 6.8 10*3/uL (ref 4.0–10.5)
nRBC: 0 % (ref 0.0–0.2)

## 2020-09-09 MED ORDER — HYDROCODONE-ACETAMINOPHEN 5-325 MG PO TABS: 1.0000 | ORAL_TABLET | Freq: Four times a day (QID) | ORAL | 0 refills | Status: DC | PRN

## 2020-09-09 MED ORDER — APIXABAN 5 MG PO TABS: 5.0000 mg | ORAL_TABLET | Freq: Two times a day (BID) | ORAL | 0 refills | Status: DC

## 2020-09-09 MED ORDER — PANTOPRAZOLE SODIUM 40 MG PO TBEC: 40.0000 mg | DELAYED_RELEASE_TABLET | Freq: Every day | ORAL | 0 refills | Status: DC

## 2020-09-09 NOTE — Progress Notes (Signed)
Order received to discharge patient.  Telemetry monitor removed and CCMD notified.  PIV access removed.  Report called to receiving RN at [facility].  PTAR on the way

## 2020-09-09 NOTE — Discharge Summary (Addendum)
Physician Discharge Summary  Shane [name] RXV:[id number] DOB: 04/18/1946 DOA: 09/05/2020  PCP: [name], MD  Admit date: 09/05/2020 Discharge date: 09/09/2020  Admitted From: Home Disposition:  SNF  Recommendations for Outpatient Follow-up:  1. Follow up with PCP in 1-2 weeks 2. Follow up with Cardiology as scheduled 3. Consider outpatient follow up for OSA, unclear if pt warrants repeat sleep study 4. Will require minimum 3 months anticoagulation  Discharge Condition:Stable CODE STATUS:DNR Diet recommendation: Heart healthy   Brief/Interim Summary: 75 y.o.malewith medical history significant ofessential hypertension, GERD, degenerative joint disease, DVTs no longer on apixaban, housing insecurity although patient does not admit, has been living in an SUV and reportedly has not been able to get out due to right knee pain. Pt was last hospialized in 12/21, at which time pt was found to have B LE DVT's and discharged on starter pack of eliquis. 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 Pt reported some improvement with prednisone and given one dose of  IV solumedrol -Pt is stable for placement  Chronic DVTs of bilateral legs - Pt has failed to continue taking apixaban after completing starter pack from 12/21. - TOC consulted to assist with medication - Continue apixaban 5 mg BID  Essential hypertension - resumed home norvasc -cont to titrate bp meds as tolerated  OSA - Pt did not tolerate CPAP in hospital -Pt reports remote sleep study diagnosing OSA. Recommend outpatient sleep study, hopefully pt would be able to acquire a more comfortable CPAP device  Housing insecurity - Concerns for homelessness noted -TOC following -PT recs for SNF  Moderate right knee effusion and severe right knee pain - xray with no fracture findings;  - severe osteoarthritis and inability to bare weight - uric acid is elevated at 10.1  - Given brief course of steroids, improved  Noncompliance  - TOC consulted to investigate home situation  -Dispo plan for SNF noted  Stage 3a CKD - stable   Discharge Instructions   Allergies as of 09/09/2020   No Known Allergies     Medication List    STOP taking these medications   Eliquis DVT/PE Starter Pack Generic drug: Apixaban Starter Pack (10mg  and 5mg ) Replaced by: apixaban 5 MG Tabs tablet     TAKE these medications   albuterol 108 (90 Base) MCG/ACT inhaler Commonly known as: VENTOLIN HFA INHALE TWO PUFFS EVERY FOUR TO SIX HOURS AS NEEDED FOR COUGH OR WHEEZE. What changed:   how much to take  how to take this  when to take this  reasons to take this  additional instructions   alum & mag hydroxide-simeth 400-400-40 MG/5ML suspension  Commonly known as: Mylanta Maximum Strength Take 5 mLs by mouth every 6 (six) hours as needed (abdominal pain or indigestion).   amLODipine 10 MG tablet Commonly known as: NORVASC Take 1 tablet (10 mg total) by mouth daily.   apixaban 5 MG Tabs tablet Commonly known as: ELIQUIS Take 1 tablet (5 mg total) by mouth 2 (two) times  daily. Replaces: Eliquis DVT/PE Starter Pack   Breo Ellipta 200-25 MCG/INH Aepb Generic drug: fluticasone furoate-vilanterol INHALE 1 PUFF BY MOUTH EVERY DAY What changed: See the new instructions.   furosemide 40 MG tablet Commonly known as: LASIX TAKE 1 TABLET BY MOUTH EVERY DAY   HYDROcodone-acetaminophen 5-325 MG tablet Commonly known as: Norco Take 1-2 tablets by mouth every 6 (six) hours as needed for moderate pain. What changed: reasons to take this   montelukast 10 MG tablet Commonly known as: SINGULAIR TAKE 1 TABLET BY MOUTH EVERYDAY AT BEDTIME What changed: See the new instructions.   pantoprazole 40 MG tablet Commonly known as: PROTONIX Take 1 tablet (40 mg total) by mouth daily. Start taking on: September 10, 2020   SYSTANE BALANCE OP Place 1 drop into both eyes daily as needed (for dry eyes).   triamcinolone 55 MCG/ACT Aero nasal inhaler Commonly known as: Nasacort Allergy 24HR Use one spray in each nostril once daily as directed. What changed:   how much to take  how to take this  when to take this  additional instructions       Follow-up Information    [name], MD. Schedule an appointment as soon as possible for a visit in 2 week(s).   Specialty: Internal Medicine Contact information: [place] [address] [phone]        Minus Breeding, MD .   Specialty: Cardiology Contact information: [address] [place] [address] [phone]              No Known Allergies    Procedures/Studies: DG Chest 1 View  Result Date: 09/05/2020 CLINICAL DATA:  Shortness of breath, leg swelling. EXAM: CHEST  1 VIEW COMPARISON:  Chest x-ray 06/18/2020, CT chest 07/17/2020 FINDINGS: The heart size and mediastinal contours are unchanged. Bibasilar atelectasis. No focal consolidation. No pulmonary edema. No pleural effusion. No pneumothorax. No acute osseous abnormality. IMPRESSION: No active disease.  Electronically Signed   By: Iven Finn M.D.   On: 09/05/2020 15:05   DG Knee Complete 4 Views Right  Result Date: 09/05/2020 CLINICAL DATA:  Right leg swelling. EXAM: RIGHT KNEE - COMPLETE 4+ VIEW COMPARISON:  None. FINDINGS: No acute fracture or dislocation. Moderate joint effusion. Mild medial compartment joint space narrowing. Small tricompartmental marginal osteophytes. Bony spurring along the medial femoral epicondyle suggestive of prior proximal MCL injury. Bone mineralization is normal. Diffuse soft tissue swelling. Atherosclerotic vascular calcifications. Multiple calcified phleboliths in the lateral right thigh. IMPRESSION: 1. Moderate joint effusion. No acute osseous abnormality. 2. Mild tricompartmental osteoarthritis. Electronically Signed   By: Titus Dubin M.D.   On: 09/05/2020 15:11   VAS [place] LOWER EXTREMITY VENOUS (DVT) (ONLY [facility] & [facility] 7a-7p)  Result Date: 09/05/2020  Lower Venous DVT Study Indications: Pain.  Limitations: Body habitus and poor ultrasound/tissue interface. Comparison Study: no prior Performing Technologist: Abram Sander RVS  Examination Guidelines: A complete evaluation includes B-mode imaging, spectral Doppler, color Doppler, and power Doppler as needed of all accessible portions of each vessel. Bilateral testing is considered an integral part of a complete examination. Limited examinations for  reoccurring indications may be performed as noted. The reflux portion of the exam is performed with the patient in reverse Trendelenburg.  +---------+---------------+---------+-----------+----------+-------------------+ RIGHT    CompressibilityPhasicitySpontaneityPropertiesThrombus Aging      +---------+---------------+---------+-----------+----------+-------------------+ CFV      Full           Yes      Yes                                      +---------+---------------+---------+-----------+----------+-------------------+ SFJ      Full                                                              +---------+---------------+---------+-----------+----------+-------------------+ FV Prox  Full                                                             +---------+---------------+---------+-----------+----------+-------------------+ FV Mid   Partial                                      Chronic             +---------+---------------+---------+-----------+----------+-------------------+ FV DistalPartial        Yes      Yes                  Chronic             +---------+---------------+---------+-----------+----------+-------------------+ PFV      Full                                                             +---------+---------------+---------+-----------+----------+-------------------+ POP      Partial        Yes      Yes                  Chronic             +---------+---------------+---------+-----------+----------+-------------------+ PTV      Partial                                      Chronic             +---------+---------------+---------+-----------+----------+-------------------+ PERO                                                  Not well visualized +---------+---------------+---------+-----------+----------+-------------------+   +----+---------------+---------+-----------+----------+-------------------+ LEFTCompressibilityPhasicitySpontaneityPropertiesThrombus Aging      +----+---------------+---------+-----------+----------+-------------------+ CFV  Not well visualized +----+---------------+---------+-----------+----------+-------------------+     Summary: RIGHT: - Findings consistent with 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 - No cystic structure found in the popliteal fossa.   *See table(s) above for measurements and observations. Electronically signed by Deitra Mayo MD on 09/05/2020 at 4:58:31 PM.    Final      Subjective: Eager to go to rehab  Discharge Exam: Vitals:   09/09/20 0324 09/09/20 0733  BP: 122/76 138/83  Pulse: 90 65  Resp: 16 17  Temp: 97.8 F (36.6 C) 98.8 F (37.1 C)  SpO2: 96% 90%   Vitals:   09/08/20 2031 09/08/20 2311 09/09/20 0324 09/09/20 0733  BP: 99/65 112/71 122/76 138/83  Pulse: 77 84 90 65  Resp: 17 16 16 17   Temp: 98.4 F (36.9 C) 98.1 F (36.7 C) 97.8 F (36.6 C) 98.8 F (37.1 C)  TempSrc: Oral Oral Oral Oral  SpO2: 95% 94% 96% 90%  Weight:      Height:        General: Pt is alert, awake, not in acute distress Cardiovascular: RRR, S1/S2 +, no rubs, no gallops Respiratory: CTA bilaterally, no wheezing, no rhonchi Abdominal: Soft, NT, ND, bowel sounds + Extremities: no edema, no cyanosis   The results of significant diagnostics from this hospitalization (including imaging, microbiology, ancillary and laboratory) are listed below for reference.     Microbiology: Recent Results (from the past 240 hour(s))  Resp Panel by RT-PCR (Flu A&B, Covid) Nasopharyngeal Swab     Status: None   Collection Time: 09/05/20  8:04 PM   Specimen: Nasopharyngeal Swab; Nasopharyngeal(NP) swabs in vial transport medium  Result Value Ref Range Status   SARS Coronavirus 2 by RT PCR NEGATIVE NEGATIVE Final    Comment: (NOTE) SARS-CoV-2 target nucleic acids are NOT DETECTED.  The SARS-CoV-2 RNA is generally detectable in upper respiratory specimens during the acute phase of infection. The lowest concentration of SARS-CoV-2 viral copies this assay can detect is 138 copies/mL. A negative result does not preclude SARS-Cov-2 infection and should not be used as the sole basis for treatment or other patient management decisions. A negative result may occur with  improper specimen collection/handling, submission of specimen other than nasopharyngeal swab, presence of viral mutation(s) within the areas targeted by this  assay, and inadequate number of viral copies(<138 copies/mL). A negative result must be combined with clinical observations, patient history, and epidemiological information. The expected result is Negative.  Fact Sheet for Patients:  EntrepreneurPulse.com.au  Fact Sheet for Healthcare Providers:  IncredibleEmployment.be  This test is no t yet approved or cleared by the [place] FDA and  has been authorized for detection and/or diagnosis of SARS-CoV-2 by FDA under an Emergency Use Authorization (EUA). This EUA will remain  in effect (meaning this test can be used) for the duration of the COVID-19 declaration under Section 564(b)(1) of the Act, 21 U.S.C.section 360bbb-3(b)(1), unless the authorization is terminated  or revoked sooner.       Influenza A by PCR NEGATIVE NEGATIVE Final   Influenza B by PCR NEGATIVE NEGATIVE Final    Comment: (NOTE) The Xpert Xpress SARS-CoV-2/FLU/RSV plus assay is intended as an aid in the diagnosis of influenza from Nasopharyngeal swab specimens and should not be used as a sole basis for treatment. Nasal washings and aspirates are unacceptable for Xpert Xpress SARS-CoV-2/FLU/RSV testing.  Fact Sheet for Patients: EntrepreneurPulse.com.au  Fact Sheet for Healthcare Providers: IncredibleEmployment.be  This test is not yet approved or cleared by the [place] and has been authorized for detection and/or diagnosis of SARS-CoV-2 by FDA under an Emergency Use Authorization (EUA). This EUA will remain in effect (meaning this test can be used) for the duration of the COVID-19 declaration under Section 564(b)(1) of the Act, 21 U.S.C. section 360bbb-3(b)(1), unless the authorization is terminated or revoked.  Performed at [facility], [place] [address] [place], [place] [address]      Labs: BNP (last 3 results) Recent Labs    12/25/19 0440 06/18/20 0311  09/05/20 1430  BNP 86.0 68.1 76.1   Basic Metabolic Panel: Recent Labs  Lab 09/05/20 1430 09/06/20 0041 09/07/20 0105 09/08/20 0105 09/09/20 0123  NA 136 134* 134* 135 136  K 4.6 4.1 3.7 4.3 3.5  CL 105 104 100 99 101  CO2 22 23 23 27 26   GLUCOSE 105* 147* 105* 104* 92  BUN 15 13 16 21 23   CREATININE 1.34* 1.28* 1.35* 1.46* 1.43*  CALCIUM 9.1 9.0 9.0 8.9 8.6*   Liver Function Tests: Recent Labs  Lab 09/05/20 1430 09/07/20 0105 09/08/20 0105 09/09/20 0123  AST 23 16 13* 11*  ALT 10 8 8 7   ALKPHOS 74 75 70 63  BILITOT 0.9 0.9 0.5 0.8  PROT 7.2 7.1 7.0 6.4*  ALBUMIN 3.8 3.6 3.5 3.3*   No results for input(s): LIPASE, AMYLASE in the last 168 hours. No results for input(s): AMMONIA in the last 168 hours. CBC: Recent Labs  Lab 09/05/20 1430 09/07/20 0105 09/09/20 0123  WBC 8.0 9.9 6.8  NEUTROABS 5.3  --   --   HGB 11.4* 11.6* 11.1*  HCT 37.2* 35.6* 35.6*  MCV 64.6* 61.8* 62.9*  PLT 155 175 184   Cardiac Enzymes: No results for input(s): CKTOTAL, CKMB, CKMBINDEX, TROPONINI in the last 168 hours. BNP: Invalid input(s): POCBNP CBG: Recent Labs  Lab 09/07/20 2115  GLUCAP 214*   D-Dimer No results for input(s): DDIMER in the last 72 hours. Hgb A1c No results for input(s): HGBA1C in the last 72 hours. Lipid Profile No results for input(s): CHOL, HDL, LDLCALC, TRIG, CHOLHDL, LDLDIRECT in the last 72 hours. Thyroid function studies No results for input(s): TSH, T4TOTAL, T3FREE, THYROIDAB in the last 72 hours.  Invalid input(s): FREET3 Anemia work up No results for input(s): VITAMINB12, FOLATE, FERRITIN, TIBC, IRON, RETICCTPCT in the last 72 hours. Urinalysis    Component Value Date/Time   COLORURINE YELLOW 12/27/2019 1136   APPEARANCEUR CLEAR 12/27/2019 1136   LABSPEC 1.017 12/27/2019 1136   PHURINE 5.0 12/27/2019 1136   GLUCOSEU NEGATIVE 12/27/2019 1136   HGBUR NEGATIVE 12/27/2019 1136   [place] 12/27/2019 1136   KETONESUR NEGATIVE  12/27/2019 1136   PROTEINUR NEGATIVE 12/27/2019 1136   NITRITE NEGATIVE 12/27/2019 1136   LEUKOCYTESUR NEGATIVE 12/27/2019 1136   Sepsis Labs Invalid input(s): PROCALCITONIN,  WBC,  LACTICIDVEN Microbiology Recent Results (from the past 240 hour(s))  Resp Panel by RT-PCR (Flu A&B, Covid) Nasopharyngeal Swab     Status: None   Collection Time: 09/05/20  8:04 PM   Specimen: Nasopharyngeal Swab; Nasopharyngeal(NP) swabs in vial transport medium  Result Value Ref Range Status   SARS Coronavirus 2 by RT PCR NEGATIVE NEGATIVE Final    Comment: (NOTE) SARS-CoV-2 target nucleic acids are NOT DETECTED.  The SARS-CoV-2 RNA is generally detectable in upper respiratory specimens during the acute phase of infection. The lowest concentration of SARS-CoV-2 viral copies this assay can detect  is 138 copies/mL. A negative result does not preclude SARS-Cov-2 infection and should not be used as the sole basis for treatment or other patient management decisions. A negative result may occur with  improper specimen collection/handling, submission of specimen other than nasopharyngeal swab, presence of viral mutation(s) within the areas targeted by this assay, and inadequate number of viral copies(<138 copies/mL). A negative result must be combined with clinical observations, patient history, and epidemiological information. The expected result is Negative.  Fact Sheet for Patients:  EntrepreneurPulse.com.au  Fact Sheet for Healthcare Providers:  IncredibleEmployment.be  This test is no t yet approved or cleared by the [place] FDA and  has been authorized for detection and/or diagnosis of SARS-CoV-2 by FDA under an Emergency Use Authorization (EUA). This EUA will remain  in effect (meaning this test can be used) for the duration of the COVID-19 declaration under Section 564(b)(1) of the Act, 21 U.S.C.section 360bbb-3(b)(1), unless the authorization is  terminated  or revoked sooner.       Influenza A by PCR NEGATIVE NEGATIVE Final   Influenza B by PCR NEGATIVE NEGATIVE Final    Comment: (NOTE) The Xpert Xpress SARS-CoV-2/FLU/RSV plus assay is intended as an aid in the diagnosis of influenza from Nasopharyngeal swab specimens and should not be used as a sole basis for treatment. Nasal washings and aspirates are unacceptable for Xpert Xpress SARS-CoV-2/FLU/RSV testing.  Fact Sheet for Patients: EntrepreneurPulse.com.au  Fact Sheet for Healthcare Providers: IncredibleEmployment.be  This test is not yet approved or cleared by the [place] FDA and has been authorized for detection and/or diagnosis of SARS-CoV-2 by FDA under an Emergency Use Authorization (EUA). This EUA will remain in effect (meaning this test can be used) for the duration of the COVID-19 declaration under Section 564(b)(1) of the Act, 21 U.S.C. section 360bbb-3(b)(1), unless the authorization is terminated or revoked.  Performed at [facility], [place] [address] [place], [place] [address]    Time spent: 30 min  SIGNED:   Marylu Lund, MD  [facility] 09/09/2020, 11:48 AM  If 7PM-7AM, please contact night-coverage

## 2020-09-09 NOTE — TOC Progression Note (Signed)
Transition of Care [facility]) - Progression Note    Patient Details  Name: GREGERY WALBERG MRN: [id number] Date of Birth: Nov 17, 1945  Transition of Care [facility]) CM/SW [place], [place] Phone Number: 09/09/2020, 12:07 PM  Clinical Narrative:     CSW informed patient of d/c today to [facility]- and [facility] transport -both approved by his insurance -patient remains agreeable.  Thurmond Butts, MSW, LCSW Clinical Social Worker   Expected Discharge Plan: Skilled Nursing Facility Barriers to Discharge: [place] Work up,SNF Pending bed offer  Expected Discharge Plan and Services Expected Discharge Plan: [place] In-house Referral: Clinical Social Work     Living arrangements for the past 2 months: [place] Expected Discharge Date: 09/09/20                                     Social Determinants of Health (SDOH) Interventions    Readmission Risk Interventions No flowsheet data found.

## 2020-09-09 NOTE — TOC Progression Note (Addendum)
Transition of Care [facility]) - Progression Note    Patient Details  Name: Shane [name] MRN: [id number] Date of Birth: 10-31-1945  Transition of Care [facility]) CM/SW [place], [place] Phone Number: 09/09/2020, 9:56 AM  Clinical Narrative:     Insurance authorization received # [id number] XJOI # [id number] [facility] has confirmed placement   Thurmond Butts, MSW, LCSW Clinical Social Worker   Expected Discharge Plan: Skilled Nursing Facility Barriers to Discharge: Insurance Authorization,[facility] Work up,SNF Pending bed offer  Expected Discharge Plan and Services Expected Discharge Plan: [place] In-house Referral: Clinical Social Work     Living arrangements for the past 2 months: [place]                                       Social Determinants of Health (SDOH) Interventions    Readmission Risk Interventions No flowsheet data found.

## 2020-09-09 NOTE — TOC Transition Note (Signed)
Transition of Care [facility]) - CM/SW Discharge Note   Patient Details  Name: Shane [name] MRN: [id number] Date of Birth: 1946/02/11  Transition of Care [facility]) CM/SW Contact:  Bary Castilla, LCSW Phone Number: [phone] 09/09/2020, 12:40 PM   Clinical Narrative:    Patient will DC to:?[facility] Anticipated DC date:?09/09/2020 Family notified:?[facility] JJ:HERD   Per MD patient ready for DC to [facility]. RN, patient, patient's family, and facility notified of DC. Discharge Summary sent to facility. RN given number for report [phone] room 107 DC packet on chart. Ambulance transport requested for patient.   CSW signing off.   [place], [place] [phone]    Final next level of care: Skilled Nursing Facility Barriers to Discharge: Barriers Resolved   Patient Goals and CMS Choice        Discharge Placement              Patient chooses bed at:  ([facility]) Patient to be transferred to facility by: [place] Name of family member notified: [name] Patient and family notified of of transfer: 09/09/20  Discharge Plan and Services In-house Referral: Clinical Social Work                                   Social Determinants of Health (SDOH) Interventions     Readmission Risk Interventions No flowsheet data found.

## 2020-09-13 ENCOUNTER — Encounter: Payer: PPO | Admitting: Nurse Practitioner

## 2020-09-13 ENCOUNTER — Telehealth: Payer: Self-pay | Admitting: Nurse Practitioner

## 2020-09-13 DIAGNOSIS — Z0289 Encounter for other administrative examinations: Secondary | ICD-10-CM

## 2020-09-13 NOTE — Telephone Encounter (Signed)
Pt was no show for appt 09/13/20 TOC. 2nd occurrence. 08/11/20 & 09/13/20 Letter mailed to dismiss from [place] only.

## 2020-09-22 ENCOUNTER — Encounter (HOSPITAL_COMMUNITY): Payer: Self-pay

## 2020-09-22 ENCOUNTER — Emergency Department (HOSPITAL_COMMUNITY): Payer: PPO

## 2020-09-22 ENCOUNTER — Emergency Department (HOSPITAL_COMMUNITY)
Admission: EM | Admit: 2020-09-22 | Discharge: 2020-09-22 | Disposition: A | Discharge status: Home / Self Care | Payer: PPO | Attending: Emergency Medicine | Admitting: Emergency Medicine

## 2020-09-22 DIAGNOSIS — R0902 Hypoxemia: Secondary | ICD-10-CM | POA: Diagnosis not present

## 2020-09-22 DIAGNOSIS — Z7951 Long term (current) use of inhaled steroids: Secondary | ICD-10-CM | POA: Diagnosis not present

## 2020-09-22 DIAGNOSIS — I10 Essential (primary) hypertension: Secondary | ICD-10-CM | POA: Diagnosis not present

## 2020-09-22 DIAGNOSIS — I129 Hypertensive chronic kidney disease with stage 1 through stage 4 chronic kidney disease, or unspecified chronic kidney disease: Secondary | ICD-10-CM | POA: Insufficient documentation

## 2020-09-22 DIAGNOSIS — M7989 Other specified soft tissue disorders: Secondary | ICD-10-CM | POA: Diagnosis not present

## 2020-09-22 DIAGNOSIS — M25562 Pain in left knee: Secondary | ICD-10-CM | POA: Insufficient documentation

## 2020-09-22 DIAGNOSIS — M1712 Unilateral primary osteoarthritis, left knee: Secondary | ICD-10-CM | POA: Diagnosis not present

## 2020-09-22 DIAGNOSIS — N1831 Chronic kidney disease, stage 3a: Secondary | ICD-10-CM | POA: Insufficient documentation

## 2020-09-22 DIAGNOSIS — M13869 Other specified arthritis, unspecified knee: Secondary | ICD-10-CM | POA: Diagnosis not present

## 2020-09-22 DIAGNOSIS — M179 Osteoarthritis of knee, unspecified: Secondary | ICD-10-CM

## 2020-09-22 DIAGNOSIS — Z515 Encounter for palliative care: Secondary | ICD-10-CM

## 2020-09-22 DIAGNOSIS — I517 Cardiomegaly: Secondary | ICD-10-CM | POA: Diagnosis not present

## 2020-09-22 DIAGNOSIS — Z87891 Personal history of nicotine dependence: Secondary | ICD-10-CM | POA: Insufficient documentation

## 2020-09-22 DIAGNOSIS — R5381 Other malaise: Secondary | ICD-10-CM

## 2020-09-22 DIAGNOSIS — I708 Atherosclerosis of other arteries: Secondary | ICD-10-CM | POA: Diagnosis not present

## 2020-09-22 DIAGNOSIS — Z7901 Long term (current) use of anticoagulants: Secondary | ICD-10-CM | POA: Insufficient documentation

## 2020-09-22 DIAGNOSIS — R4182 Altered mental status, unspecified: Secondary | ICD-10-CM | POA: Diagnosis not present

## 2020-09-22 DIAGNOSIS — M25462 Effusion, left knee: Secondary | ICD-10-CM | POA: Diagnosis not present

## 2020-09-22 DIAGNOSIS — Z79899 Other long term (current) drug therapy: Secondary | ICD-10-CM | POA: Diagnosis not present

## 2020-09-22 DIAGNOSIS — M13862 Other specified arthritis, left knee: Secondary | ICD-10-CM

## 2020-09-22 DIAGNOSIS — R404 Transient alteration of awareness: Secondary | ICD-10-CM | POA: Diagnosis not present

## 2020-09-22 DIAGNOSIS — M79605 Pain in left leg: Secondary | ICD-10-CM | POA: Diagnosis present

## 2020-09-22 DIAGNOSIS — J4540 Moderate persistent asthma, uncomplicated: Secondary | ICD-10-CM | POA: Diagnosis not present

## 2020-09-22 DIAGNOSIS — M1710 Unilateral primary osteoarthritis, unspecified knee: Secondary | ICD-10-CM

## 2020-09-22 LAB — CBC WITH DIFFERENTIAL/PLATELET
Abs Immature Granulocytes: 0.05 10*3/uL (ref 0.00–0.07)
Basophils Absolute: 0 10*3/uL (ref 0.0–0.1)
Basophils Relative: 0 %
Eosinophils Absolute: 0.1 10*3/uL (ref 0.0–0.5)
Eosinophils Relative: 1 %
HCT: 38.5 % — ABNORMAL LOW (ref 39.0–52.0)
Hemoglobin: 12.3 g/dL — ABNORMAL LOW (ref 13.0–17.0)
Immature Granulocytes: 1 %
Lymphocytes Relative: 13 %
Lymphs Abs: 1.3 10*3/uL (ref 0.7–4.0)
MCH: 19.5 pg — ABNORMAL LOW (ref 26.0–34.0)
MCHC: 31.9 g/dL (ref 30.0–36.0)
MCV: 61 fL — ABNORMAL LOW (ref 80.0–100.0)
Monocytes Absolute: 0.9 10*3/uL (ref 0.1–1.0)
Monocytes Relative: 10 %
Neutro Abs: 7.4 10*3/uL (ref 1.7–7.7)
Neutrophils Relative %: 75 %
Platelets: 312 10*3/uL (ref 150–400)
RBC: 6.31 MIL/uL — ABNORMAL HIGH (ref 4.22–5.81)
RDW: 17.3 % — ABNORMAL HIGH (ref 11.5–15.5)
WBC: 9.8 10*3/uL (ref 4.0–10.5)
nRBC: 0 % (ref 0.0–0.2)

## 2020-09-22 LAB — COMPREHENSIVE METABOLIC PANEL
ALT: 26 U/L (ref 0–44)
AST: 46 U/L — ABNORMAL HIGH (ref 15–41)
Albumin: 2.5 g/dL — ABNORMAL LOW (ref 3.5–5.0)
Alkaline Phosphatase: 83 U/L (ref 38–126)
Anion gap: 9 (ref 5–15)
BUN: 27 mg/dL — ABNORMAL HIGH (ref 8–23)
CO2: 25 mmol/L (ref 22–32)
Calcium: 8.7 mg/dL — ABNORMAL LOW (ref 8.9–10.3)
Chloride: 100 mmol/L (ref 98–111)
Creatinine, Ser: 1.37 mg/dL — ABNORMAL HIGH (ref 0.61–1.24)
GFR, Estimated: 53 mL/min — ABNORMAL LOW (ref >60)
Glucose, Bld: 119 mg/dL — ABNORMAL HIGH (ref 70–99)
Potassium: 5.5 mmol/L — ABNORMAL HIGH (ref 3.5–5.1)
Sodium: 134 mmol/L — ABNORMAL LOW (ref 135–145)
Total Bilirubin: 2.1 mg/dL — ABNORMAL HIGH (ref 0.3–1.2)
Total Protein: 7.3 g/dL (ref 6.5–8.1)

## 2020-09-22 MED ORDER — LORAZEPAM 2 MG/ML IJ SOLN
1.0000 mg | Freq: Once | INTRAMUSCULAR | Status: AC
  Administered 2020-09-22: 1 mg via INTRAVENOUS
  Filled 2020-09-22: qty 1

## 2020-09-22 MED ORDER — KETOROLAC TROMETHAMINE 30 MG/ML IJ SOLN
30.0000 mg | Freq: Once | INTRAMUSCULAR | Status: AC
  Administered 2020-09-22: 30 mg via INTRAVENOUS
  Filled 2020-09-22: qty 1

## 2020-09-22 MED ORDER — METHYLPREDNISOLONE SODIUM SUCC 125 MG IJ SOLR
125.0000 mg | Freq: Once | INTRAMUSCULAR | Status: AC
  Administered 2020-09-22: 125 mg via INTRAVENOUS
  Filled 2020-09-22: qty 2

## 2020-09-22 MED ORDER — IOHEXOL 350 MG/ML SOLN
75.0000 mL | Freq: Once | INTRAVENOUS | Status: AC | PRN
  Administered 2020-09-22: 75 mL via INTRAVENOUS

## 2020-09-22 MED ORDER — FENTANYL CITRATE (PF) 100 MCG/2ML IJ SOLN
100.0000 ug | Freq: Once | INTRAMUSCULAR | Status: AC
  Administered 2020-09-22: 100 ug via INTRAVENOUS
  Filled 2020-09-22: qty 2

## 2020-09-22 MED ORDER — SODIUM CHLORIDE 0.9 % IV SOLN: INTRAVENOUS | Status: DC

## 2020-09-22 MED ORDER — LORAZEPAM 2 MG/ML IJ SOLN
1.0000 mg | INTRAMUSCULAR | Status: DC | PRN
  Administered 2020-09-22: 1 mg via INTRAVENOUS
  Filled 2020-09-22: qty 1

## 2020-09-22 NOTE — ED Triage Notes (Signed)
Pt comes from [place] via EMS, chief complaint AMS, Staff reports pt has been acting differently over 24 hour period, Pt has been at facility for 4 days. Staff also reports decrease in urination and decrease in mobility and appetite. Staff reports increased pain in lower extremities bilaterally due to DVT. EMS reports pt was transported in the 3/8 for DVT and was more alert and communicative. Pt a/o x4. 20g IV established in left hand, 100 mcg Fentanyl administered. BP 157/96 O2 100% 2L [place] HR 78 [id number]

## 2020-09-22 NOTE — ED Provider Notes (Signed)
[facility] EMERGENCY DEPARTMENT Provider Note   CSN: [id number] Arrival date & time: 09/22/20  8850     History Chief Complaint  Patient presents with  . Altered Mental Status  . Leg Pain    Shane [name] is a 75 y.o. male.  HPI He presents for evaluation of left leg pain, from the knee to the foot, for 1 month which prevents him from walking.  He is currently in a rest home/rehab, where he feels he is not improving.  This morning EMS was contacted because of altered mental status and he was transferred here for further evaluation.  The patient is alert and conversant and able to give good history.  He states that he is thirsty but not hungry and did not eat breakfast this morning.  Is currently taking pain medication, without resolution of his discomfort.  He states he initially had a blood clot in the right leg but is now in the left leg.  Discharged from the hospital on 09/09/2020 after evaluation for multiple problems.  At that time he was not being treated for DVT, and it was noted that he was initially diagnosed in December 2021 and took Eliquis only for 3 weeks, and never followed up for further care.  At that time he was felt to have right femoral DVT and bilateral chronic DVTs.  It is reported that he was restarted on Eliquis during that stay.  During hospitalization he was complaining of right knee pain, not left.  Patient states he has not ambulated since he went to the rehab facility.  Today during transport EMS noted that he was hypoxic at 89% on room air.  He was therefore placed on nasal cannula oxygen with improvement of the oxygen to normal levels with supplementation.  The patient denies shortness of breath, cough, fever, chills, wheezing, abdominal or back pain at this time.  There are no other known active modifying factors.    Past Medical History:  Diagnosis Date  . Acute asthmatic bronchitis   . Anemia   . Anxiety disorder   . Borderline  diabetes mellitus   . Borderline hypertension   . Chronic back pain   . Degenerative joint disease   . Depression   . GERD (gastroesophageal reflux disease)   . History of adenomatous polyp of colon   . History of sinusitis   . Posttraumatic stress disorder   . Sleep apnea    Uses CPAP    Patient Active Problem List   Diagnosis Date Noted  . DVT (deep venous thrombosis) ([place]) 09/08/2020  . Bilateral leg pain 09/05/2020  . 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 Poor tolerance for ambulation 09/05/2020  . Effusion of right knee 09/05/2020  . Osteoarthritis of right knee 09/05/2020  . Essential hypertension 09/05/2020  . Noncompliance 09/05/2020  . Homelessness 09/05/2020  . Acquired thrombophilia ([place]) 09/05/2020  . Poor personal hygiene 06/18/2020  . Stage 3a chronic kidney disease ([place]) 06/18/2020  . Diet-controlled diabetes mellitus ([place]) 06/18/2020  . Class 2 obesity due to excess calories with body mass index (BMI) of 38.0 to 38.9 in adult 06/18/2020  . Back pain 06/18/2020  . Leg swelling 06/04/2020  . Educated about COVID-19 virus infection 02/03/2020  . Precordial chest pain 02/03/2020  . ACS (acute coronary syndrome) ([place]) 12/26/2019  . AKI (acute kidney injury) ([place]) 12/26/2019  . Moderate persistent asthma  12/25/2019  . Right femoral vein DVT ([place]) 03/01/2019  . Bradycardia 07/30/2018  . Sleep apnea 07/30/2018  . Murmur 04/30/2018  . Dizziness 04/30/2018  . Palpitations 04/06/2016  . Elevated blood pressure reading without diagnosis of hypertension 01/12/2016  . Sinusitis, chronic 12/19/2015  . Pain in joint, upper arm 11/23/2015  . Numbness and tingling in left arm 11/23/2015  . Asthmatic bronchitis 02/21/2015  . PTSD (post-traumatic stress disorder) 02/21/2015  . Hemorrhoids 06/15/2014  . Microcytic anemia 03/31/2008  . GERD  03/31/2008  . Impaired fasting glucose 03/31/2008    Past Surgical History:  Procedure Laterality Date  . COLONOSCOPY    . FINGER SURGERY Right    index  . NASAL SINUS SURGERY     x 2  . POLYPECTOMY         Family History  Problem Relation Age of Onset  . Diabetes Father   . Arthritis Mother        Unknown cause of death  . Colon cancer Neg Hx   . Colon polyps Neg Hx   . Rectal cancer Neg Hx   . Stomach cancer Neg Hx   . Allergic rhinitis Neg Hx   . Angioedema Neg Hx   . Asthma Neg Hx   . Eczema Neg Hx   . Immunodeficiency Neg Hx   . Urticaria Neg Hx     Social History   Tobacco Use  . Smoking status: Former Smoker    Packs/day: 0.50    Years: 25.00    Pack years: 12.50    Types: Cigarettes    Quit date: 07/02/1983    Years since quitting: 37.2  . Smokeless tobacco: Never Used  Vaping Use  . Vaping Use: Never used  Substance Use Topics  . Alcohol use: No  . Drug use: No    Home Medications Prior to Admission medications   Medication Sig Start Date End Date Taking? Authorizing Provider  albuterol (VENTOLIN HFA) 108 (90 Base) MCG/ACT inhaler INHALE TWO PUFFS EVERY FOUR TO SIX HOURS AS NEEDED FOR COUGH OR WHEEZE. Patient taking differently: Inhale 2 puffs into the lungs every 4 (four) hours as needed for wheezing (cough). 04/03/20   Kozlow, Donnamarie Poag, MD  alum & mag hydroxide-simeth [facility] MAXIMUM STRENGTH) 400-400-40 MG/5ML suspension Take 5 mLs by mouth every 6 (six) hours as needed (abdominal pain or indigestion). 06/10/19   Carmin Muskrat, MD  amLODipine (NORVASC) 10 MG tablet Take 1 tablet (10 mg total) by mouth daily. 12/28/19   Samuella Cota, MD  apixaban (ELIQUIS) 5 MG TABS tablet Take 1 tablet (5 mg total) by mouth 2 (two) times daily. 09/09/20 10/09/20  Donne Hazel, MD  BREO ELLIPTA 200-25 MCG/INH AEPB INHALE 1 PUFF BY MOUTH EVERY DAY Patient taking differently: Inhale 1 puff into the lungs daily. 03/16/20   Kozlow, Donnamarie Poag, MD  furosemide (LASIX)  40 MG tablet TAKE 1 TABLET BY MOUTH EVERY DAY Patient taking differently: Take 40 mg by mouth daily. 08/07/20   Minus Breeding, MD  HYDROcodone-acetaminophen (NORCO) 5-325 MG tablet Take 1-2 tablets by mouth every 6 (six) hours as needed for moderate pain. 09/09/20   Donne Hazel, MD  montelukast (SINGULAIR) 10 MG tablet TAKE 1 TABLET BY MOUTH EVERYDAY AT BEDTIME Patient taking differently: Take 10 mg by mouth at bedtime. 09/09/19   Kozlow, Donnamarie Poag, MD  pantoprazole (PROTONIX) 40 MG tablet Take 1 tablet (40 mg total) by mouth daily. 09/10/20 10/10/20  Donne Hazel, MD  Propylene Glycol (SYSTANE BALANCE OP) Place 1 drop into both eyes daily as needed (for dry eyes).    [provider]  triamcinolone (NASACORT ALLERGY 24HR) 55 MCG/ACT AERO nasal inhaler Use one spray in each nostril once daily as directed. Patient taking differently: Place 1 spray into the nose daily. 01/28/17   Kozlow, Donnamarie Poag, MD    Allergies    Patient has no known allergies.  Review of Systems   Review of Systems  All other systems reviewed and are negative.   Physical Exam Updated Vital Signs BP 96/74   Pulse 79   Temp 98.7 F (37.1 C) (Oral)   Resp 20   SpO2 93%   Physical Exam Vitals and nursing note reviewed.  Constitutional:      General: He is not in acute distress.    Appearance: He is well-developed. He is obese. He is not ill-appearing, toxic-appearing or diaphoretic.  HENT:     Head: Normocephalic and atraumatic.     Right Ear: External ear normal.     Left Ear: External ear normal.     Mouth/Throat:     Mouth: Mucous membranes are moist.     Pharynx: No oropharyngeal exudate or posterior oropharyngeal erythema.  Eyes:     Conjunctiva/sclera: Conjunctivae normal.     Pupils: Pupils are equal, round, and reactive to light.  Neck:     Trachea: Phonation normal.  Cardiovascular:     Rate and Rhythm: Normal rate and regular rhythm.     Heart sounds: Normal heart sounds.  Pulmonary:      Effort: Pulmonary effort is normal.     Breath sounds: Normal breath sounds.  Abdominal:     General: There is no distension.     Palpations: Abdomen is soft.     Tenderness: There is no abdominal tenderness.  Musculoskeletal:     Cervical back: Normal range of motion and neck supple.     Comments: Legs are markedly swollen bilaterally, likely combination of obesity and edema.  Left knee, lower leg and left foot are all tender to touch with exquisite pain on movement of either the left knee, ankle or foot.  There are no significant overlying skin changes of either leg.  Skin:    General: Skin is warm and dry.     Coloration: Skin is not jaundiced or pale.     Findings: No rash.  Neurological:     Mental Status: He is alert and oriented to person, place, and time.     Cranial Nerves: No cranial nerve deficit.     Sensory: No sensory deficit.     Motor: No abnormal muscle tone.     Coordination: Coordination normal.  Psychiatric:        Mood and Affect: Mood normal.        Behavior: Behavior normal.        Thought Content: Thought content normal.        Judgment: Judgment normal.     ED Results / Procedures / Treatments   Labs (all labs ordered are listed, but only abnormal results are displayed) Labs Reviewed  CBC WITH DIFFERENTIAL/PLATELET - Abnormal; Notable for the following components:      Result Value   RBC 6.31 (*)    Hemoglobin 12.3 (*)    HCT 38.5 (*)    MCV 61.0 (*)    MCH 19.5 (*)    RDW 17.3 (*)    All other components within normal limits  COMPREHENSIVE METABOLIC PANEL - Abnormal; Notable for the following components:   Sodium 134 (*)    Potassium 5.5 (*)    Glucose, Bld 119 (*)    BUN 27 (*)    Creatinine, Ser 1.37 (*)    Calcium 8.7 (*)    Albumin 2.5 (*)    AST 46 (*)    Total Bilirubin 2.1 (*)    GFR, Estimated 53 (*)    All other components within normal limits    EKG EKG Interpretation  Date/Time:  Friday September 22 2020 09:46:58  EDT Ventricular Rate:  72 PR Interval:    QRS Duration: 96 QT Interval:  407 QTC Calculation: 446 R Axis:   -7 Text Interpretation: Sinus rhythm Left ventricular hypertrophy Inferior infarct, old since last tracing no significant change Confirmed by Daleen Bo [phone]) on 09/22/2020 9:59:01 AM   Radiology DG Knee 2 Views Left  Result Date: 09/22/2020 CLINICAL DATA:  Diffuse pain and swelling EXAM: LEFT KNEE - 1-2 VIEW COMPARISON:  None. FINDINGS: Atypical positioning. There is probably a moderate-sized joint effusion. There is mild weight-bearing compartment joint space narrowing and evidence patellofemoral osteoarthritis. No fracture or focal bone lesion. Regional arterial calcification is noted. IMPRESSION: Probable moderate joint effusion. Mild weight-bearing compartment joint space narrowing and evidence of patellofemoral osteoarthritis. No fracture or focal bone lesion. Electronically Signed   By: Nelson Chimes M.D.   On: 09/22/2020 15:07   DG Chest Port 1 View  Result Date: 09/22/2020 CLINICAL DATA:  Altered mental status EXAM: PORTABLE CHEST 1 VIEW COMPARISON:  09/05/2020 FINDINGS: The heart size and mediastinal contours are within normal limits. Both lungs are clear. The visualized skeletal structures are unremarkable except for similar degenerative changes of the spine. Tortuous aorta noted. IMPRESSION: No active disease. Electronically Signed   By: Jerilynn Mages.  Shick M.D.   On: 09/22/2020 10:26    Procedures .Critical Care Performed by: Daleen Bo, MD Authorized by: Daleen Bo, MD   Critical care provider statement:    Critical care time (minutes):  35   Critical care start time:  09/22/2020 9:55 AM   Critical care end time:  09/22/2020 3:35 PM   Critical care time was exclusive of:  Separately billable procedures and treating other patients   Critical care was necessary to treat or prevent imminent or life-threatening deterioration of the following conditions:  CNS failure or  compromise and respiratory failure   Critical care was time spent personally by me on the following activities:  Blood draw for specimens, development of treatment plan with patient or surrogate, discussions with consultants, evaluation of patient's response to treatment, examination of patient, obtaining history from patient or surrogate, ordering and performing treatments and interventions, ordering and review of laboratory studies, pulse oximetry, re-evaluation of patient's condition, review of old charts and ordering and review of radiographic studies     Medications Ordered in ED Medications  0.9 %  sodium chloride infusion ( Intravenous New Bag/Given 09/22/20 0951)  LORazepam (ATIVAN) injection 1 mg (has no administration in time range)  methylPREDNISolone sodium succinate (SOLU-MEDROL) 125 mg/2 mL injection 125 mg (has no administration in time range)  fentaNYL (SUBLIMAZE) injection 100 mcg (100 mcg Intravenous Given 09/22/20 1137)  ketorolac (TORADOL) 30 MG/ML injection 30 mg (30 mg Intravenous Given 09/22/20 1356)  LORazepam (ATIVAN) injection 1 mg (1 mg Intravenous Given 09/22/20 1358)    ED Course  I have reviewed the triage vital signs and the nursing notes.  Pertinent labs & imaging results that  were available during my care of the patient were reviewed by me and considered in my medical decision making (see chart for details).  Clinical Course as of 09/22/20 1538  Fri Sep 22, 2020  1507 Oxygen now 95% on room air, which he has been on since arrival.  He has not required support with nasal cannula oxygen.  At this time he now agrees that he can go for CT imaging.  X-ray, left knee, no fracture, as interpreted by me.  I will give him another milligram of Ativan, prior to transport to CT. [EW]    Clinical Course User Index [EW] Daleen Bo, MD   MDM Rules/Calculators/A&P                           Patient Vitals for the past 24 hrs:  BP Temp Temp src Pulse Resp SpO2   09/22/20 1500 96/74 -- -- 79 20 93 %  09/22/20 1400 (!) 131/94 -- -- 76 (!) 26 98 %  09/22/20 1345 131/83 -- -- 77 15 98 %  09/22/20 1330 128/83 -- -- 79 16 97 %  09/22/20 1300 120/81 -- -- 72 16 96 %  09/22/20 1230 110/74 -- -- 74 19 92 %  09/22/20 1200 115/73 -- -- 76 20 92 %  09/22/20 1145 106/76 -- -- 77 16 92 %  09/22/20 1030 134/81 -- -- 70 (!) 23 100 %  09/22/20 1015 134/78 -- -- 71 16 100 %  09/22/20 1000 113/83 -- -- 74 13 98 %  09/22/20 0945 120/75 -- -- 76 18 97 %  09/22/20 0930 127/81 -- -- 76 17 93 %  09/22/20 0923 119/81 98.7 F (37.1 C) Oral 75 14 95 %    -At time of discharge reevaluation with update and discussion. After initial assessment and treatment, an updated evaluation reveals no further complaints, findings discussed and questions answered. Daleen Bo   Medical Decision Making:  This patient is presenting for evaluation of mental status, decreased mobility, decreased urination, which does require a range of treatment options, and is a complaint that involves a moderate risk of morbidity and mortality. The differential diagnoses include progression of VTE, occult fracture, PE, being maintained in a rest home/rehab, following hospital discharge. I decided to review old records, and in summary elderly male, presenting with painful condition, recently started on Eliquis for VTE.  I did not require additional historical information from anyone.  Clinical Laboratory Tests Ordered, included CBC and Metabolic panel. Review indicates normal except sodium slightly low, potassium mildly elevated, glucose high, BUN high, creatinine high, calcium low, albumin low, AST high, total bilirubin high, GFR low, hemoglobin low, MCV low.  Note that blood was hemolyzed, likely explaining mild elevation of creatinine with stable creatinine. Radiologic Tests Ordered, included chest x-ray, x-ray left knee, CT chest PE study for evaluation.  I independently Visualized: Radiograph  images, which show chest x-ray normal.  X-ray left knee with effusion and patellofemoral arthritis  Cardiac Monitor Tracing which shows normal sinus rhythm     Critical Interventions-clinical evaluation, laboratory testing, radiography, irrigation treat observation  Patient is moderately difficult to manage, because of painful condition as well as difficulty for him to understand the needs and requirements of ED evaluation and treatment possibilities.  He remained able to communicate and discuss; as well as plan for treatments.  After These Interventions, the Patient was reevaluated and was found improved, with treatments.  He is on Eliquis for  DVTs, primarily right which is acute on chronic with bilateral chronic leg DVTs.  Patient with moderate left knee and lower leg pain, requiring evaluation and treatment.  No evidence for fracture.  Doubt progression of VTE of the lower extremity, PE study chest ordered to evaluate transient hypoxia.  He was hypoxic in the field, but not in the ED.  Patient is severely debilitated, currently in rehab and nonambulatory.  Left knee x-rays without fracture but does show patellofemoral arthritis and effusion.  His pain was difficult to treat in the ED.  He could potentially benefit from short course of prednisone, with symptomatic care for knee arthritis.  He was given a dose of Solu-Medrol in ED.  He is currently being treated with hydrocodone at his facility.   CRITICAL CARE-yes Performed by: Daleen Bo  Nursing Notes Reviewed/ Care Coordinated Applicable Imaging Reviewed Interpretation of Laboratory Data incorporated into ED treatment     Final Clinical Impression(s) / ED Diagnoses Final diagnoses:  Acute pain of left knee  Allergic arthritis of left knee  Debilitated    Rx / DC Orders ED Discharge Orders    None       Daleen Bo, MD 09/23/20 1859

## 2020-09-22 NOTE — ED Notes (Addendum)
Pt would not allow this EMT to lay the bed back so that this EMT could place a [employer] External Catheter on him. The pt stated he wanted to wait until later after pain meds were administered. Informed Zoe-RN

## 2020-09-22 NOTE — Discharge Instructions (Signed)
Follow-up with primary doctor.  Return to ER for worsening confusion, difficulty breathing or other new concerning symptom.

## 2020-09-22 NOTE — Progress Notes (Signed)
Palliative Care Care Plan Review  Patient was flagged as high risk for 12 month mortality and I was asked to review his chart for unmet palliative care needs.  Shane [name] has had 2 inpatient admissions in the past 2 months and 3 ED visits. He was recently discharged from the hospital on 3/12 after he had postthrombotic inflammation and worsening DVT symptoms including pain and swelling in his bilateral lower extremities-he was unable to fill his anticogulation after he finished his starter pack of Eloquis back in December. He has been homeless living in an SUV and unable to meet his basic needs. Pain in his extremities has been his predominant presenting issue for emergency room and hospital based care. He has not been able to make any of his outpatient appointments in several months but seeks ED care for painmedication and antihypertensive medication.  He arrived to the ED today from [facility] where he was recentkly discharged from last hospitalization on 3/12. He has not been able to rehab at [facility] due to pain in his lower extremities and staff report recent poor appetite and AMS.   Patient's insurance as of 3/28 will no longer be a payor source for [facility] because he does not meet medicare criteria for SNF rehab. It is possible that he has been unable to rehab because of inadequate pain management since often SNF will not administer PRN opioids. We could attempt to schedule both Tylenol + Oxycodone along with a bowel regimen to see if he is able to function better. I also recommend palliative care consultation.  This patient will need to have assistance applying for Medicaid immediately since he is homeless and will not have a place to live now or in the near future when his medicare will no longer cover SNF rehab level care. At this time he is alert and oriented but will need significant assistance in navigating obtaining resources. He is likely going to need custodial care  very soon- minimum he will need placement in ALF or LTC. Otherwise he will continue to utilize the ED for primary care and has not been able to meet his basic personal or medical care needs out of his SUV living arrangement.  Lane Hacker, DO Palliative Medicine

## 2020-09-22 NOTE — ED Notes (Signed)
Dr Eulis Foster at bedside at this time

## 2020-09-22 NOTE — ED Notes (Signed)
Pt refusing to go to CT until he gets more pain medication. Dr Eulis Foster notified.

## 2020-09-22 NOTE — ED Notes (Signed)
Bladder scan completed.  19cc post void, PA Courtni, notified

## 2020-09-22 NOTE — ED Provider Notes (Signed)
Signout note  75 year old male presents to ER from facility with concern for altered mental status, acting differently.  EMS reported brief hypoxia.  Has history of DVTs, currently on anticoagulation.  Patient is alert, at baseline mentally at present.  Basic labs stable.    4:00 PM Received sign out from [facility].  CT scan ordered to rule out PE.  If CT negative, discharge.  5:09 PM CT is negative. Vitals are stable. Patient will be discharged.   Lucrezia Starch, MD 09/22/20 [phone]

## 2020-09-22 NOTE — ED Notes (Signed)
Pt soiled himself. Pt taken out of dirty brief, pt would not allow [place] to move him in a way that we could put a clean brief on him. He stated that he was in too much pain. Clean chux pad placed under him, and pt cleaned.

## 2020-10-25 DIAGNOSIS — N390 Urinary tract infection, site not specified: Secondary | ICD-10-CM | POA: Diagnosis not present

## 2020-10-26 ENCOUNTER — Inpatient Hospital Stay (HOSPITAL_COMMUNITY)
Admission: EM | Admit: 2020-10-26 | Discharge: 2020-11-02 | DRG: 698 | Disposition: A | Discharge status: Skilled Nursing Facility | Payer: PPO | Attending: Internal Medicine | Admitting: Internal Medicine

## 2020-10-26 ENCOUNTER — Emergency Department (HOSPITAL_COMMUNITY): Payer: PPO

## 2020-10-26 DIAGNOSIS — K3189 Other diseases of stomach and duodenum: Secondary | ICD-10-CM | POA: Diagnosis not present

## 2020-10-26 DIAGNOSIS — K254 Chronic or unspecified gastric ulcer with hemorrhage: Secondary | ICD-10-CM | POA: Diagnosis not present

## 2020-10-26 DIAGNOSIS — K2980 Duodenitis without bleeding: Secondary | ICD-10-CM | POA: Diagnosis present

## 2020-10-26 DIAGNOSIS — A419 Sepsis, unspecified organism: Secondary | ICD-10-CM

## 2020-10-26 DIAGNOSIS — I248 Other forms of acute ischemic heart disease: Secondary | ICD-10-CM | POA: Diagnosis present

## 2020-10-26 DIAGNOSIS — K250 Acute gastric ulcer with hemorrhage: Secondary | ICD-10-CM | POA: Diagnosis not present

## 2020-10-26 DIAGNOSIS — T83511A Infection and inflammatory reaction due to indwelling urethral catheter, initial encounter: Principal | ICD-10-CM | POA: Diagnosis present

## 2020-10-26 DIAGNOSIS — K2970 Gastritis, unspecified, without bleeding: Secondary | ICD-10-CM

## 2020-10-26 DIAGNOSIS — I5032 Chronic diastolic (congestive) heart failure: Secondary | ICD-10-CM | POA: Diagnosis not present

## 2020-10-26 DIAGNOSIS — K222 Esophageal obstruction: Secondary | ICD-10-CM | POA: Diagnosis not present

## 2020-10-26 DIAGNOSIS — I130 Hypertensive heart and chronic kidney disease with heart failure and stage 1 through stage 4 chronic kidney disease, or unspecified chronic kidney disease: Secondary | ICD-10-CM | POA: Diagnosis present

## 2020-10-26 DIAGNOSIS — Z8261 Family history of arthritis: Secondary | ICD-10-CM

## 2020-10-26 DIAGNOSIS — R531 Weakness: Secondary | ICD-10-CM | POA: Diagnosis not present

## 2020-10-26 DIAGNOSIS — Z66 Do not resuscitate: Secondary | ICD-10-CM | POA: Diagnosis present

## 2020-10-26 DIAGNOSIS — D62 Acute posthemorrhagic anemia: Secondary | ICD-10-CM | POA: Diagnosis not present

## 2020-10-26 DIAGNOSIS — I82513 Chronic embolism and thrombosis of femoral vein, bilateral: Secondary | ICD-10-CM | POA: Diagnosis present

## 2020-10-26 DIAGNOSIS — Z9114 Patient's other noncompliance with medication regimen: Secondary | ICD-10-CM

## 2020-10-26 DIAGNOSIS — D509 Iron deficiency anemia, unspecified: Secondary | ICD-10-CM | POA: Diagnosis present

## 2020-10-26 DIAGNOSIS — A4151 Sepsis due to Escherichia coli [E. coli]: Secondary | ICD-10-CM | POA: Diagnosis present

## 2020-10-26 DIAGNOSIS — Z86010 Personal history of colonic polyps: Secondary | ICD-10-CM | POA: Diagnosis not present

## 2020-10-26 DIAGNOSIS — I82543 Chronic embolism and thrombosis of tibial vein, bilateral: Secondary | ICD-10-CM | POA: Diagnosis present

## 2020-10-26 DIAGNOSIS — K449 Diaphragmatic hernia without obstruction or gangrene: Secondary | ICD-10-CM | POA: Diagnosis not present

## 2020-10-26 DIAGNOSIS — R7989 Other specified abnormal findings of blood chemistry: Secondary | ICD-10-CM | POA: Diagnosis not present

## 2020-10-26 DIAGNOSIS — I82533 Chronic embolism and thrombosis of popliteal vein, bilateral: Secondary | ICD-10-CM | POA: Diagnosis not present

## 2020-10-26 DIAGNOSIS — D500 Iron deficiency anemia secondary to blood loss (chronic): Secondary | ICD-10-CM | POA: Diagnosis not present

## 2020-10-26 DIAGNOSIS — M2550 Pain in unspecified joint: Secondary | ICD-10-CM | POA: Diagnosis not present

## 2020-10-26 DIAGNOSIS — Z20822 Contact with and (suspected) exposure to covid-19: Secondary | ICD-10-CM | POA: Diagnosis present

## 2020-10-26 DIAGNOSIS — Z6834 Body mass index (BMI) 34.0-34.9, adult: Secondary | ICD-10-CM

## 2020-10-26 DIAGNOSIS — R001 Bradycardia, unspecified: Secondary | ICD-10-CM | POA: Diagnosis present

## 2020-10-26 DIAGNOSIS — I10 Essential (primary) hypertension: Secondary | ICD-10-CM | POA: Diagnosis not present

## 2020-10-26 DIAGNOSIS — N179 Acute kidney failure, unspecified: Secondary | ICD-10-CM | POA: Diagnosis present

## 2020-10-26 DIAGNOSIS — J45909 Unspecified asthma, uncomplicated: Secondary | ICD-10-CM | POA: Diagnosis present

## 2020-10-26 DIAGNOSIS — E869 Volume depletion, unspecified: Secondary | ICD-10-CM | POA: Diagnosis present

## 2020-10-26 DIAGNOSIS — R7303 Prediabetes: Secondary | ICD-10-CM | POA: Diagnosis present

## 2020-10-26 DIAGNOSIS — Z86718 Personal history of other venous thrombosis and embolism: Secondary | ICD-10-CM | POA: Diagnosis not present

## 2020-10-26 DIAGNOSIS — K2990 Gastroduodenitis, unspecified, without bleeding: Secondary | ICD-10-CM | POA: Diagnosis present

## 2020-10-26 DIAGNOSIS — Z7401 Bed confinement status: Secondary | ICD-10-CM | POA: Diagnosis not present

## 2020-10-26 DIAGNOSIS — Z7951 Long term (current) use of inhaled steroids: Secondary | ICD-10-CM

## 2020-10-26 DIAGNOSIS — Z7901 Long term (current) use of anticoagulants: Secondary | ICD-10-CM | POA: Diagnosis not present

## 2020-10-26 DIAGNOSIS — Y846 Urinary catheterization as the cause of abnormal reaction of the patient, or of later complication, without mention of misadventure at the time of the procedure: Secondary | ICD-10-CM | POA: Diagnosis present

## 2020-10-26 DIAGNOSIS — G9341 Metabolic encephalopathy: Secondary | ICD-10-CM | POA: Diagnosis present

## 2020-10-26 DIAGNOSIS — R7881 Bacteremia: Secondary | ICD-10-CM | POA: Diagnosis not present

## 2020-10-26 DIAGNOSIS — R6520 Severe sepsis without septic shock: Secondary | ICD-10-CM | POA: Diagnosis not present

## 2020-10-26 DIAGNOSIS — I440 Atrioventricular block, first degree: Secondary | ICD-10-CM | POA: Diagnosis present

## 2020-10-26 DIAGNOSIS — R6521 Severe sepsis with septic shock: Secondary | ICD-10-CM | POA: Diagnosis present

## 2020-10-26 DIAGNOSIS — K317 Polyp of stomach and duodenum: Secondary | ICD-10-CM | POA: Diagnosis present

## 2020-10-26 DIAGNOSIS — Z79899 Other long term (current) drug therapy: Secondary | ICD-10-CM

## 2020-10-26 DIAGNOSIS — N1831 Chronic kidney disease, stage 3a: Secondary | ICD-10-CM | POA: Diagnosis present

## 2020-10-26 DIAGNOSIS — D132 Benign neoplasm of duodenum: Secondary | ICD-10-CM | POA: Diagnosis not present

## 2020-10-26 DIAGNOSIS — F4310 Post-traumatic stress disorder, unspecified: Secondary | ICD-10-CM | POA: Diagnosis present

## 2020-10-26 DIAGNOSIS — R000 Tachycardia, unspecified: Secondary | ICD-10-CM | POA: Diagnosis not present

## 2020-10-26 DIAGNOSIS — R5381 Other malaise: Secondary | ICD-10-CM | POA: Diagnosis not present

## 2020-10-26 DIAGNOSIS — N390 Urinary tract infection, site not specified: Secondary | ICD-10-CM | POA: Diagnosis present

## 2020-10-26 DIAGNOSIS — G4733 Obstructive sleep apnea (adult) (pediatric): Secondary | ICD-10-CM | POA: Diagnosis present

## 2020-10-26 DIAGNOSIS — E876 Hypokalemia: Secondary | ICD-10-CM | POA: Diagnosis present

## 2020-10-26 DIAGNOSIS — R319 Hematuria, unspecified: Secondary | ICD-10-CM | POA: Diagnosis not present

## 2020-10-26 DIAGNOSIS — M1711 Unilateral primary osteoarthritis, right knee: Secondary | ICD-10-CM | POA: Diagnosis present

## 2020-10-26 DIAGNOSIS — E669 Obesity, unspecified: Secondary | ICD-10-CM | POA: Diagnosis present

## 2020-10-26 DIAGNOSIS — M109 Gout, unspecified: Secondary | ICD-10-CM | POA: Diagnosis present

## 2020-10-26 DIAGNOSIS — R195 Other fecal abnormalities: Secondary | ICD-10-CM

## 2020-10-26 DIAGNOSIS — R0902 Hypoxemia: Secondary | ICD-10-CM | POA: Diagnosis not present

## 2020-10-26 DIAGNOSIS — R58 Hemorrhage, not elsewhere classified: Secondary | ICD-10-CM | POA: Diagnosis not present

## 2020-10-26 DIAGNOSIS — K219 Gastro-esophageal reflux disease without esophagitis: Secondary | ICD-10-CM | POA: Diagnosis present

## 2020-10-26 DIAGNOSIS — Z87891 Personal history of nicotine dependence: Secondary | ICD-10-CM

## 2020-10-26 LAB — CBC WITH DIFFERENTIAL/PLATELET
Abs Immature Granulocytes: 0 10*3/uL (ref 0.00–0.07)
Basophils Absolute: 0 10*3/uL (ref 0.0–0.1)
Basophils Relative: 0 %
Eosinophils Absolute: 0 10*3/uL (ref 0.0–0.5)
Eosinophils Relative: 1 %
HCT: 38.1 % — ABNORMAL LOW (ref 39.0–52.0)
Hemoglobin: 11.7 g/dL — ABNORMAL LOW (ref 13.0–17.0)
Lymphocytes Relative: 35 %
Lymphs Abs: 0.5 10*3/uL — ABNORMAL LOW (ref 0.7–4.0)
MCH: 18.8 pg — ABNORMAL LOW (ref 26.0–34.0)
MCHC: 30.7 g/dL (ref 30.0–36.0)
MCV: 61.2 fL — ABNORMAL LOW (ref 80.0–100.0)
Monocytes Absolute: 0 10*3/uL — ABNORMAL LOW (ref 0.1–1.0)
Monocytes Relative: 0 %
Neutro Abs: 0.9 10*3/uL — ABNORMAL LOW (ref 1.7–7.7)
Neutrophils Relative %: 64 %
Platelets: 206 10*3/uL (ref 150–400)
RBC: 6.23 MIL/uL — ABNORMAL HIGH (ref 4.22–5.81)
RDW: 17.8 % — ABNORMAL HIGH (ref 11.5–15.5)
WBC: 1.4 10*3/uL — CL (ref 4.0–10.5)
nRBC: 18 /100 WBC — ABNORMAL HIGH
nRBC: 6.7 % — ABNORMAL HIGH (ref 0.0–0.2)

## 2020-10-26 LAB — COMPREHENSIVE METABOLIC PANEL
ALT: 10 U/L (ref 0–44)
AST: 23 U/L (ref 15–41)
Albumin: 2.9 g/dL — ABNORMAL LOW (ref 3.5–5.0)
Alkaline Phosphatase: 147 U/L — ABNORMAL HIGH (ref 38–126)
Anion gap: 15 (ref 5–15)
BUN: 11 mg/dL (ref 8–23)
CO2: 21 mmol/L — ABNORMAL LOW (ref 22–32)
Calcium: 8.8 mg/dL — ABNORMAL LOW (ref 8.9–10.3)
Chloride: 99 mmol/L (ref 98–111)
Creatinine, Ser: 1.74 mg/dL — ABNORMAL HIGH (ref 0.61–1.24)
GFR, Estimated: 40 mL/min — ABNORMAL LOW (ref >60)
Glucose, Bld: 127 mg/dL — ABNORMAL HIGH (ref 70–99)
Potassium: 3 mmol/L — ABNORMAL LOW (ref 3.5–5.1)
Sodium: 135 mmol/L (ref 135–145)
Total Bilirubin: 1.7 mg/dL — ABNORMAL HIGH (ref 0.3–1.2)
Total Protein: 7 g/dL (ref 6.5–8.1)

## 2020-10-26 LAB — I-STAT ARTERIAL BLOOD GAS, ED
Acid-base deficit: 3 mmol/L — ABNORMAL HIGH (ref 0.0–2.0)
Bicarbonate: 18.7 mmol/L — ABNORMAL LOW (ref 20.0–28.0)
Calcium, Ion: 1.11 mmol/L — ABNORMAL LOW (ref 1.15–1.40)
HCT: 27 % — ABNORMAL LOW (ref 39.0–52.0)
Hemoglobin: 9.2 g/dL — ABNORMAL LOW (ref 13.0–17.0)
O2 Saturation: 94 %
Patient temperature: 101.2
Potassium: 2.6 mmol/L — CL (ref 3.5–5.1)
Sodium: 140 mmol/L (ref 135–145)
TCO2: 19 mmol/L — ABNORMAL LOW (ref 22–32)
pCO2 arterial: 23.3 mmHg — ABNORMAL LOW (ref 32.0–48.0)
pH, Arterial: 7.519 — ABNORMAL HIGH (ref 7.350–7.450)
pO2, Arterial: 66 mmHg — ABNORMAL LOW (ref 83.0–108.0)

## 2020-10-26 LAB — URINALYSIS, ROUTINE W REFLEX MICROSCOPIC
Bilirubin Urine: NEGATIVE
Glucose, UA: NEGATIVE mg/dL
Ketones, ur: NEGATIVE mg/dL
Nitrite: POSITIVE — AB
Protein, ur: NEGATIVE mg/dL
RBC / HPF: >50 RBC/hpf — ABNORMAL HIGH (ref 0–5)
Specific Gravity, Urine: 1.009 (ref 1.005–1.030)
pH: 7 (ref 5.0–8.0)

## 2020-10-26 LAB — APTT: aPTT: 28 seconds (ref 24–36)

## 2020-10-26 LAB — CBG MONITORING, ED: Glucose-Capillary: 126 mg/dL — ABNORMAL HIGH (ref 70–99)

## 2020-10-26 LAB — LACTIC ACID, PLASMA
Lactic Acid, Venous: 5.1 mmol/L (ref 0.5–1.9)
Lactic Acid, Venous: 8.1 mmol/L (ref 0.5–1.9)

## 2020-10-26 LAB — PROTIME-INR
INR: 2.1 — ABNORMAL HIGH (ref 0.8–1.2)
Prothrombin Time: 23.3 seconds — ABNORMAL HIGH (ref 11.4–15.2)

## 2020-10-26 LAB — RESP PANEL BY RT-PCR (FLU A&B, COVID) ARPGX2
Influenza A by PCR: NEGATIVE
Influenza B by PCR: NEGATIVE
SARS Coronavirus 2 by RT PCR: NEGATIVE

## 2020-10-26 LAB — OCCULT BLOOD X 1 CARD TO LAB, STOOL: Fecal Occult Bld: POSITIVE — AB

## 2020-10-26 MED ORDER — LACTATED RINGERS IV SOLN: INTRAVENOUS | Status: AC

## 2020-10-26 MED ORDER — PANTOPRAZOLE SODIUM 40 MG IV SOLR
40.0000 mg | Freq: Every day | INTRAVENOUS | Status: DC
  Administered 2020-10-27 – 2020-10-28 (×3): 40 mg via INTRAVENOUS
  Filled 2020-10-26 (×3): qty 40

## 2020-10-26 MED ORDER — DOCUSATE SODIUM 100 MG PO CAPS: 100.0000 mg | ORAL_CAPSULE | Freq: Two times a day (BID) | ORAL | Status: DC | PRN

## 2020-10-26 MED ORDER — LACTATED RINGERS IV BOLUS (SEPSIS): 1000.0000 mL | Freq: Once | INTRAVENOUS | Status: DC

## 2020-10-26 MED ORDER — VANCOMYCIN HCL 2000 MG/400ML IV SOLN
2000.0000 mg | Freq: Once | INTRAVENOUS | Status: AC
  Administered 2020-10-26: 2000 mg via INTRAVENOUS
  Filled 2020-10-26: qty 400

## 2020-10-26 MED ORDER — POLYETHYLENE GLYCOL 3350 17 G PO PACK: 17.0000 g | PACK | Freq: Every day | ORAL | Status: DC | PRN

## 2020-10-26 MED ORDER — SODIUM CHLORIDE 0.9 % IV SOLN
1.0000 g | Freq: Two times a day (BID) | INTRAVENOUS | Status: DC
  Administered 2020-10-27: 1 g via INTRAVENOUS
  Filled 2020-10-26 (×2): qty 1

## 2020-10-26 MED ORDER — ACETAMINOPHEN 650 MG RE SUPP
650.0000 mg | Freq: Once | RECTAL | Status: AC
  Administered 2020-10-26: 650 mg via RECTAL
  Filled 2020-10-26: qty 1

## 2020-10-26 MED ORDER — LACTATED RINGERS IV BOLUS (SEPSIS)
1000.0000 mL | Freq: Once | INTRAVENOUS | Status: AC
  Administered 2020-10-26: 1000 mL via INTRAVENOUS

## 2020-10-26 MED ORDER — SODIUM CHLORIDE 0.9 % IV SOLN
250.0000 mL | INTRAVENOUS | Status: DC
  Administered 2020-10-27: 250 mL via INTRAVENOUS

## 2020-10-26 MED ORDER — ONDANSETRON HCL 4 MG/2ML IJ SOLN: 4.0000 mg | Freq: Four times a day (QID) | INTRAMUSCULAR | Status: DC | PRN

## 2020-10-26 MED ORDER — VANCOMYCIN HCL 1500 MG/300ML IV SOLN: 1500.0000 mg | INTRAVENOUS | Status: DC

## 2020-10-26 MED ORDER — NOREPINEPHRINE 4 MG/250ML-% IV SOLN
2.0000 ug/min | INTRAVENOUS | Status: DC
  Administered 2020-10-26: 2 ug/min via INTRAVENOUS
  Administered 2020-10-27 (×3): 10 ug/min via INTRAVENOUS
  Administered 2020-10-28: 5 ug/min via INTRAVENOUS
  Filled 2020-10-26 (×5): qty 250

## 2020-10-26 MED ORDER — HEPARIN SODIUM (PORCINE) 5000 UNIT/ML IJ SOLN
5000.0000 [IU] | Freq: Three times a day (TID) | INTRAMUSCULAR | Status: DC
  Administered 2020-10-27: 5000 [IU] via SUBCUTANEOUS
  Filled 2020-10-26: qty 1

## 2020-10-26 MED ORDER — SODIUM CHLORIDE 0.9 % IV SOLN
2.0000 g | Freq: Once | INTRAVENOUS | Status: AC
  Administered 2020-10-26: 2 g via INTRAVENOUS
  Filled 2020-10-26: qty 2

## 2020-10-26 MED ORDER — LACTATED RINGERS IV BOLUS (SEPSIS)
1000.0000 mL | Freq: Once | INTRAVENOUS | Status: AC
Start: 2020-10-26 — End: 2020-10-26
  Administered 2020-10-26: 1000 mL via INTRAVENOUS

## 2020-10-26 NOTE — ED Triage Notes (Signed)
Pat to ED for complaints of blood in urine bag. States he had blood in catheter x2 days, replaced catheter today, blood still present. Fraser Din is on eliqus for HX of DVT. Unresponsive on arrival, tachycardic, and hypotensive.

## 2020-10-26 NOTE — ED Provider Notes (Signed)
[facility] EMERGENCY DEPARTMENT Provider Note   CSN: [id number] Arrival date & time: 10/26/20  1743     History Chief Complaint  Patient presents with  . Hematuria    Shane [name] is a 76 y.o. male.  Patient is a 75 year old male with a history of DVT on Eliquis, CKD, obesity, diabetes, prior homelessness but now lives in a skilled facility with chronic Foley catheter presenting today with altered mental status.  Very limited history was given it was reported 2 to 3 days ago they noticed some blood in his Foley catheter and they replaced it today and much more blood came out.  EMS reported that patient was not helping them do anything and was minimally responsive.  No further history is available.  The history is provided by the EMS personnel. The history is limited by the condition of the patient.  Hematuria       Past Medical History:  Diagnosis Date  . Acute asthmatic bronchitis   . Anemia   . Anxiety disorder   . Borderline diabetes mellitus   . Borderline hypertension   . Chronic back pain   . Degenerative joint disease   . Depression   . GERD (gastroesophageal reflux disease)   . History of adenomatous polyp of colon   . History of sinusitis   . Posttraumatic stress disorder   . Sleep apnea    Uses CPAP    Patient Active Problem List   Diagnosis Date Noted  . DVT (deep venous thrombosis) ([place]) 09/08/2020  . Bilateral leg pain 09/05/2020  . 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 Poor tolerance for ambulation 09/05/2020  . Effusion of right knee 09/05/2020  . Osteoarthritis of right knee 09/05/2020  . Essential hypertension 09/05/2020  . Noncompliance 09/05/2020  . Homelessness 09/05/2020  . Acquired thrombophilia ([place]) 09/05/2020  . Poor personal hygiene 06/18/2020  . Stage 3a chronic kidney disease ([place])  06/18/2020  . Diet-controlled diabetes mellitus ([place]) 06/18/2020  . Class 2 obesity due to excess calories with body mass index (BMI) of 38.0 to 38.9 in adult 06/18/2020  . Back pain 06/18/2020  . Leg swelling 06/04/2020  . Educated about COVID-19 virus infection 02/03/2020  . Precordial chest pain 02/03/2020  . ACS (acute coronary syndrome) ([place]) 12/26/2019  . AKI (acute kidney injury) ([place]) 12/26/2019  . Moderate persistent asthma 12/25/2019  . Right femoral vein DVT ([place]) 03/01/2019  . Bradycardia 07/30/2018  . Sleep apnea 07/30/2018  . Murmur 04/30/2018  . Dizziness 04/30/2018  . Palpitations 04/06/2016  . Elevated blood pressure reading without diagnosis of hypertension 01/12/2016  . Sinusitis, chronic 12/19/2015  . Pain in joint, upper arm 11/23/2015  . Numbness and tingling in left arm 11/23/2015  . Asthmatic bronchitis 02/21/2015  . PTSD (post-traumatic stress disorder) 02/21/2015  . Hemorrhoids 06/15/2014  . Microcytic anemia 03/31/2008  . GERD 03/31/2008  . Impaired fasting glucose 03/31/2008    Past Surgical History:  Procedure Laterality Date  . COLONOSCOPY    . FINGER SURGERY Right    index  . NASAL SINUS SURGERY     x 2  . POLYPECTOMY         Family History  Problem Relation Age of Onset  . Diabetes Father   . Arthritis Mother        Unknown cause of death  . Colon cancer Neg Hx   .  Colon polyps Neg Hx   . Rectal cancer Neg Hx   . Stomach cancer Neg Hx   . Allergic rhinitis Neg Hx   . Angioedema Neg Hx   . Asthma Neg Hx   . Eczema Neg Hx   . Immunodeficiency Neg Hx   . Urticaria Neg Hx     Social History   Tobacco Use  . Smoking status: Former Smoker    Packs/day: 0.50    Years: 25.00    Pack years: 12.50    Types: Cigarettes    Quit date: 07/02/1983    Years since quitting: 37.3  . Smokeless tobacco: Never Used  Vaping Use  . Vaping Use: Never used  Substance Use Topics  . Alcohol use: No  . Drug use: No    Home  Medications Prior to Admission medications   Medication Sig Start Date End Date Taking? Authorizing Provider  albuterol (VENTOLIN HFA) 108 (90 Base) MCG/ACT inhaler INHALE TWO PUFFS EVERY FOUR TO SIX HOURS AS NEEDED FOR COUGH OR WHEEZE. Patient taking differently: Inhale 2 puffs into the lungs every 4 (four) hours as needed for wheezing (cough). 04/03/20   Kozlow, Donnamarie Poag, MD  alum & mag hydroxide-[facility] MAXIMUM STRENGTH) 400-400-40 MG/5ML suspension Take 5 mLs by mouth every 6 (six) hours as needed (abdominal pain or indigestion). 06/10/19   Carmin Muskrat, MD  amLODipine (NORVASC) 10 MG tablet Take 1 tablet (10 mg total) by mouth daily. 12/28/19   Samuella Cota, MD  apixaban (ELIQUIS) 5 MG TABS tablet Take 1 tablet (5 mg total) by mouth 2 (two) times daily. 09/09/20 10/09/20  Donne Hazel, MD  BREO ELLIPTA 200-25 MCG/INH AEPB INHALE 1 PUFF BY MOUTH EVERY DAY Patient taking differently: Inhale 1 puff into the lungs daily. 03/16/20   Kozlow, Donnamarie Poag, MD  furosemide (LASIX) 40 MG tablet TAKE 1 TABLET BY MOUTH EVERY DAY Patient taking differently: Take 40 mg by mouth daily. 08/07/20   Minus Breeding, MD  HYDROcodone-acetaminophen (NORCO) 5-325 MG tablet Take 1-2 tablets by mouth every 6 (six) hours as needed for moderate pain. 09/09/20   Donne Hazel, MD  montelukast (SINGULAIR) 10 MG tablet TAKE 1 TABLET BY MOUTH EVERYDAY AT BEDTIME Patient taking differently: Take 10 mg by mouth at bedtime. 09/09/19   Kozlow, Donnamarie Poag, MD  pantoprazole (PROTONIX) 40 MG tablet Take 1 tablet (40 mg total) by mouth daily. 09/10/20 10/10/20  Donne Hazel, MD  Propylene Glycol (SYSTANE BALANCE OP) Place 1 drop into both eyes daily as needed (for dry eyes).    [provider]  triamcinolone (NASACORT ALLERGY 24HR) 55 MCG/ACT AERO nasal inhaler Use one spray in each nostril once daily as directed. Patient taking differently: Place 1 spray into the nose daily. 01/28/17   Kozlow, Donnamarie Poag, MD     Allergies    Patient has no known allergies.  Review of Systems   Review of Systems  Unable to perform ROS: Acuity of condition  Genitourinary: Positive for hematuria.    Physical Exam Updated Vital Signs BP (!) 66/45   Pulse (!) 130   Temp (!) 104.8 F (40.4 C) (Rectal)   Resp (!) 38   Ht 6\' 2"  (1.88 m)   Wt 122.5 kg   BMI 34.67 kg/m   Physical Exam Vitals and nursing note reviewed.  Constitutional:      Appearance: He is obese. He is ill-appearing.     Comments: Lethargic and minimally responsive  HENT:  Head: Normocephalic.     Mouth/Throat:     Mouth: Mucous membranes are dry.  Eyes:     Extraocular Movements: Extraocular movements intact.     Pupils: Pupils are equal, round, and reactive to light.  Cardiovascular:     Rate and Rhythm: Tachycardia present.  Pulmonary:     Effort: Tachypnea present.     Breath sounds: Normal breath sounds.  Abdominal:     General: Abdomen is flat.     Tenderness: There is no abdominal tenderness.  Genitourinary:    Comments: Foley catheter in place with gross blood present Musculoskeletal:     Cervical back: Neck supple.     Comments: Patient is generally rigid  Skin:    General: Skin is dry.     Comments: Hot to the touch  Neurological:     Comments: Not moving any extremities.  Appears to have some coo small breathing.  Not moving any extremities voluntarily.  Not following commands.  No eye deviation  Psychiatric:     Comments: Unresponsive     ED Results / Procedures / Treatments   Labs (all labs ordered are listed, but only abnormal results are displayed) Labs Reviewed  LACTIC ACID, PLASMA - Abnormal; Notable for the following components:      Result Value   Lactic Acid, Venous 5.1 (*)    All other components within normal limits  LACTIC ACID, PLASMA - Abnormal; Notable for the following components:   Lactic Acid, Venous 8.1 (*)    All other components within normal limits  COMPREHENSIVE METABOLIC  PANEL - Abnormal; Notable for the following components:   Potassium 3.0 (*)    CO2 21 (*)    Glucose, Bld 127 (*)    Creatinine, Ser 1.74 (*)    Calcium 8.8 (*)    Albumin 2.9 (*)    Alkaline Phosphatase 147 (*)    Total Bilirubin 1.7 (*)    GFR, Estimated 40 (*)    All other components within normal limits  CBC WITH DIFFERENTIAL/PLATELET - Abnormal; Notable for the following components:   WBC 1.4 (*)    RBC 6.23 (*)    Hemoglobin 11.7 (*)    HCT 38.1 (*)    MCV 61.2 (*)    MCH 18.8 (*)    RDW 17.8 (*)    nRBC 6.7 (*)    Neutro Abs 0.9 (*)    Lymphs Abs 0.5 (*)    Monocytes Absolute 0.0 (*)    nRBC 18 (*)    All other components within normal limits  PROTIME-INR - Abnormal; Notable for the following components:   Prothrombin Time 23.3 (*)    INR 2.1 (*)    All other components within normal limits  URINALYSIS, ROUTINE W REFLEX MICROSCOPIC - Abnormal; Notable for the following components:   Color, Urine AMBER (*)    APPearance HAZY (*)    Hgb urine dipstick LARGE (*)    Nitrite POSITIVE (*)    Leukocytes,Ua TRACE (*)    RBC / HPF >50 (*)    Bacteria, UA RARE (*)    All other components within normal limits  OCCULT BLOOD X 1 CARD TO LAB, STOOL - Abnormal; Notable for the following components:   Fecal Occult Bld POSITIVE (*)    All other components within normal limits  CBG MONITORING, ED - Abnormal; Notable for the following components:   Glucose-Capillary 126 (*)    All other components within normal limits  RESP PANEL BY  RT-PCR (FLU A&B, COVID) ARPGX2  CULTURE, BLOOD (ROUTINE X 2)  CULTURE, BLOOD (ROUTINE X 2)  URINE CULTURE  APTT  TYPE AND SCREEN    EKG EKG Interpretation  Date/Time:  Thursday October 26 2020 20:34:22 EDT Ventricular Rate:  118 PR Interval:  126 QRS Duration: 94 QT Interval:  305 QTC Calculation: 428 R Axis:   1 Text Interpretation: Sinus tachycardia Ventricular premature complex Abnormal R-wave progression, early transition new Repol  abnrm suggests ischemia, diffuse leads Confirmed by Blanchie Dessert [phone]) on 10/26/2020 9:31:01 PM   Radiology DG Chest Port 1 View  Result Date: 10/26/2020 CLINICAL DATA:  Blood in urine back. Eliquis for history of DVT. Unresponsive, tachycardic, and hypertensive. EXAM: PORTABLE CHEST 1 VIEW COMPARISON:  09/22/2020 FINDINGS: Shallow inspiration. Heart size and pulmonary vascularity are normal for inspiratory effort. No focal consolidation or airspace disease in the lungs. No pleural effusions. No pneumothorax. Mediastinal contours appear intact. Degenerative changes in the spine. IMPRESSION: No active disease. Electronically Signed   By: Lucienne Capers M.D.   On: 10/26/2020 19:19    Procedures Procedures   Medications Ordered in ED Medications  lactated ringers infusion (has no administration in time range)  lactated ringers bolus 1,000 mL (has no administration in time range)    And  lactated ringers bolus 1,000 mL (has no administration in time range)    And  lactated ringers bolus 1,000 mL (has no administration in time range)    And  lactated ringers bolus 1,000 mL (has no administration in time range)  ceFEPIme (MAXIPIME) 2 g in sodium chloride 0.9 % 100 mL IVPB (has no administration in time range)  vancomycin (VANCOREADY) IVPB 2000 mg/400 mL (has no administration in time range)  acetaminophen (TYLENOL) suppository 650 mg (has no administration in time range)    ED Course  I have reviewed the triage vital signs and the nursing notes.  Pertinent labs & imaging results that were available during my care of the patient were reviewed by me and considered in my medical decision making (see chart for details).    MDM Rules/Calculators/A&P                          Patient presenting today with unresponsiveness.  He was tachycardic and hypotensive with pulse in the 130s and blood pressure in the 46E systolic.  Blood sugar was within normal limits.  Patient's rectal  temperature was 104.8.  Code sepsis initiated.  30/kg of LR was ordered.  Patient was covered with broad-spectrum antibiotics.  Oxygen saturation seems to be 98% but he does have some coo small breathing and will drop to as low as 92% on room air.  He was placed on a nonrebreather.  Patient given PR Tylenol.  Type and screen also ordered given the extent of blood in his Foley catheter bag.  Concern for UTI versus source of other infection.  No findings to suggest cellulitis at this time.  Patient does have a DNR in file and it reports in the event of cardiac or respiratory arrest he does not wish to have CPR.  7:16 PM After 1 L bolus of fluid patient's blood pressure has improved to greater than 100.  He remains tachycardic in the 130s.  Fever is still 104.  Lactate 5.1, CMP with a mild AKI with creatinine of 1.7 today from his baseline of 1.4.  Hypokalemic at 3.0.  CBC with leukopenia with a white count of 1.4  and hemoglobin of 11.  INR of 2.1.  Urine and chest x-ray are still pending.  Patient has already received the antibiotics.  We will plan on admitting for sepsis.  9:42 PM Repeat lactate is 8.  Patient has received 2 L and is currently getting 150/h and heart rate has come down as well as his temperature.  Now 100.4 and pulse of 103.  Blood pressure is 91/53 and maps remain greater than 65.  Blood pressure is fluctuated between the 90s and 100s.  Patient is now more responsive yet still has some garbled speech.  He is Hemoccult positive but again hemoglobin was stable at 11.  Will need to be trended.  COVID and flu are negative, urine with trace leukocytes and greater than 50 white cells but rare bacteria.  Unclear that this is the source for his fever.  Chest x-ray within normal limits.  Patient remains 99%.  Will admit for further care.  MDM Number of Diagnoses or Management Options   Amount and/or Complexity of Data Reviewed Clinical lab tests: ordered and reviewed Tests in the radiology  section of CPT: ordered and reviewed Tests in the medicine section of CPT: ordered and reviewed Decide to obtain previous medical records or to obtain history from someone other than the patient: yes Obtain history from someone other than the patient: yes Review and summarize past medical records: yes Discuss the patient with other providers: yes Independent visualization of images, tracings, or specimens: yes  Risk of Complications, Morbidity, and/or Mortality Presenting problems: high Diagnostic procedures: high Management options: high  Patient Progress Patient progress: improved  CRITICAL CARE Performed by: Brysan Mcevoy Total critical care time: 45 minutes Critical care time was exclusive of separately billable procedures and treating other patients. Critical care was necessary to treat or prevent imminent or life-threatening deterioration. Critical care was time spent personally by me on the following activities: development of treatment plan with patient and/or surrogate as well as nursing, discussions with consultants, evaluation of patient's response to treatment, examination of patient, obtaining history from patient or surrogate, ordering and performing treatments and interventions, ordering and review of laboratory studies, ordering and review of radiographic studies, pulse oximetry and re-evaluation of patient's condition.   Final Clinical Impression(s) / ED Diagnoses Final diagnoses:  Septic shock ([place])  Sepsis with acute renal failure and septic shock, due to unspecified organism, unspecified acute renal failure type [facility])    Rx / DC Orders ED Discharge Orders    None       Blanchie Dessert, MD 10/26/20 2145

## 2020-10-26 NOTE — Sepsis Progress Note (Addendum)
Secure chat sent to bedside RN Luvenia Starch to see if the charting is correct in that the antibiotic was given prior to the blood culture draw. She stated that blood cultures were indeed drawn first by phlebotomy.

## 2020-10-26 NOTE — Progress Notes (Signed)
Pharmacy Antibiotic Note  Shane [name] is a 75 y.o. male admitted on 10/26/2020 with altered mental status concerning for infection of unknown source.  Pharmacy has been consulted for Cefepime and vancomycin dosing.  WBC 1.4, LA 5.1, Tc 104.7F, SCr 1.74 (BL ~ 1.3)  Plan: -Cefepime 2 gm IV 12 hours -Vancomycin 2 gm IV load followed by vancomycin 1500 mg IV Q 24 hours per traditional nomogram. -Monitor CBC, renal fx, cultures and clinical progress -Vanc levels as indicated   Height: 6\' 2"  (188 cm) Weight: 122.5 kg (270 lb 1 oz) IBW/kg (Calculated) : 82.2  Temp (24hrs), Avg:104.8 F (40.4 C), Min:104.8 F (40.4 C), Max:104.8 F (40.4 C)  No results for input(s): WBC, CREATININE, LATICACIDVEN, VANCOTROUGH, VANCOPEAK, VANCORANDOM, GENTTROUGH, GENTPEAK, GENTRANDOM, TOBRATROUGH, TOBRAPEAK, TOBRARND, AMIKACINPEAK, AMIKACINTROU, AMIKACIN in the last 168 hours.  CrCl cannot be calculated (Patient's most recent lab result is older than the maximum 21 days allowed.).    No Known Allergies  Antimicrobials this admission: Vanc 4/28 >>  Cefepime 4/28 >>   Dose adjustments this admission:  Microbiology results: 4/28 BCx:  4/28 UCx:     Thank you for allowing pharmacy to be a part of this patient's care.  Albertina Parr, PharmD., BCPS, BCCCP Clinical Pharmacist Please refer to [facility] for unit-specific pharmacist

## 2020-10-26 NOTE — H&P (Signed)
NAME:  Shane [name], MRN:  [id number], DOB:  07-11-1945, LOS: 0 ADMISSION DATE:  10/26/2020,   History of Present Illness:  This is a 75 year old black male that presented from a skilled nursing facility.  Very limited history is available of recent events.  The patient was sent to the emergency room because he was less responsive than normal and that he had hematuria after having a Foley catheter replaced yesterday.  Initially on the exam the patient answered several questions yes and no.  He answered no to pain and fevers.  After that he did not answer any further questions.  Pertinent  Medical History  Right femoral vein DVT Stage IIIa chronic kidney disease Osteoarthritis of the right knee Essential hypertension   Significant Hospital Events: Including procedures, antibiotic start and stop dates in addition to other pertinent events   .     Objective   Blood pressure (!) 77/50, pulse 94, temperature 98.8 F (37.1 C), temperature source Oral, resp. rate (!) 25, height 6\' 2"  (1.88 m), weight 122.5 kg, SpO2 97 %.        Intake/Output Summary (Last 24 hours) at 10/26/2020 2324 Last data filed at 10/26/2020 2157 Gross per 24 hour  Intake 2500.38 ml  Output --  Net 2500.38 ml   Filed Weights   10/26/20 1754  Weight: 122.5 kg    Examination: General: No acute distress. HENT: Anicteric, mucous membranes are dry Lungs: Clear to auscultation bilaterally.  No wheezing rales or rhonchi Cardiovascular: Regular rate 1 out of 6 systolic ejection murmur Abdomen: Soft, nondistended, nontender, no rebound/rigidity/guarding though limited by neurologic status. Extremities: Trace edema of the ankles.  Patient has poorly cared for toenails. Neuro: Patient's alertness waxed and waned throughout the exam.  He initially answered yes/no questions and then became less responsive.  Not following commands. GU: Foley catheter intact.  Blood at the meatus.  Labs/imaging that I  havepersonally reviewed  (right click and "Reselect all SmartList Selections" daily)      Assessment & Plan:  Septic shock with an unclear source. Acute renal insufficiency Intravascular volume depletion Metabolic encephalopathy  Plan: Patient is a DNR/DNI. Admit to the intensive care unit for further work-up patient may need vasoactive support. Will complete LR bolus now. Blood cultures being sent. Broad-spectrum antibiotics with Vanco and cefepime have been ordered. Maintain Foley catheter.  Hematuria appears to be resolving. Trend procalcitonin and lactic acid. Send ABG now.   Best practice (right click and "Reselect all SmartList Selections" daily)  Diet:  NPO Pain/Anxiety/Delirium protocol (if indicated): No VAP protocol (if indicated): Not indicated DVT prophylaxis: Subcutaneous Heparin GI prophylaxis: PPI Glucose control:  SSI No Central venous access:  N/A Arterial line:  N/A Foley:  Yes, and it is still needed Mobility:  bed rest  PT consulted: N/A Last date of multidisciplinary goals of care discussion  Code Status:  DNR Disposition: Admit to the intensive care unit  Labs   CBC: Recent Labs  Lab 10/26/20 1807  WBC 1.4*  NEUTROABS 0.9*  HGB 11.7*  HCT 38.1*  MCV 61.2*  PLT 315    Basic Metabolic Panel: Recent Labs  Lab 10/26/20 1807  NA 135  K 3.0*  CL 99  CO2 21*  GLUCOSE 127*  BUN 11  CREATININE 1.74*  CALCIUM 8.8*   GFR: Estimated Creatinine Clearance: 50.2 mL/min (A) (by C-G formula based on SCr of 1.74 mg/dL (H)). Recent Labs  Lab 10/26/20 1807 10/26/20 1808 10/26/20 2009  WBC 1.4*  --   --  LATICACIDVEN  --  5.1* 8.1*    Liver Function Tests: Recent Labs  Lab 10/26/20 1807  AST 23  ALT 10  ALKPHOS 147*  BILITOT 1.7*  PROT 7.0  ALBUMIN 2.9*   No results for input(s): LIPASE, AMYLASE in the last 168 hours. No results for input(s): AMMONIA in the last 168 hours.  ABG    Component Value Date/Time   TCO2 25  02/12/2015 0529     Coagulation Profile: Recent Labs  Lab 10/26/20 1807  INR 2.1*    Cardiac Enzymes: No results for input(s): CKTOTAL, CKMB, CKMBINDEX, TROPONINI in the last 168 hours.  HbA1C: Hgb A1c MFr Bld  Date/Time Value Ref Range Status  06/18/2020 11:08 AM 5.8 (H) 4.8 - 5.6 % Final    Comment:    (NOTE) Pre diabetes:          5.7%-6.4%  Diabetes:              >6.4%  Glycemic control for   <7.0% adults with diabetes   03/01/2019 03:40 PM 6.2 4.6 - 6.5 % Final    Comment:    Glycemic Control Guidelines for People with Diabetes:Non Diabetic:  <6%Goal of Therapy: <7%Additional Action Suggested:  >8%     CBG: Recent Labs  Lab 10/26/20 1745  GLUCAP 126*    Review of Systems:   Patient unable to provide any information  Past Medical History:  He,  has a past medical history of Acute asthmatic bronchitis, Anemia, Anxiety disorder, Borderline diabetes mellitus, Borderline hypertension, Chronic back pain, Degenerative joint disease, Depression, GERD (gastroesophageal reflux disease), History of adenomatous polyp of colon, History of sinusitis, Posttraumatic stress disorder, and Sleep apnea.   Surgical History:   Past Surgical History:  Procedure Laterality Date  . COLONOSCOPY    . FINGER SURGERY Right    index  . NASAL SINUS SURGERY     x 2  . POLYPECTOMY       Social History:   reports that he quit smoking about 37 years ago. His smoking use included cigarettes. He has a 12.50 pack-year smoking history. He has never used smokeless tobacco. He reports that he does not drink alcohol and does not use drugs.   Family History:  His family history includes Arthritis in his mother; Diabetes in his father. There is no history of Colon cancer, Colon polyps, Rectal cancer, Stomach cancer, Allergic rhinitis, Angioedema, Asthma, Eczema, Immunodeficiency, or Urticaria.   Allergies No Known Allergies   Home Medications  Prior to Admission medications   Medication  Sig Start Date End Date Taking? Authorizing Provider  albuterol (VENTOLIN HFA) 108 (90 Base) MCG/ACT inhaler INHALE TWO PUFFS EVERY FOUR TO SIX HOURS AS NEEDED FOR COUGH OR WHEEZE. Patient taking differently: Inhale 2 puffs into the lungs every 4 (four) hours as needed for wheezing (cough). 04/03/20   Kozlow, Donnamarie Poag, MD  alum & mag hydroxide-simeth [facility] MAXIMUM STRENGTH) 400-400-40 MG/5ML suspension Take 5 mLs by mouth every 6 (six) hours as needed (abdominal pain or indigestion). 06/10/19   Carmin Muskrat, MD  amLODipine (NORVASC) 10 MG tablet Take 1 tablet (10 mg total) by mouth daily. 12/28/19   Samuella Cota, MD  apixaban (ELIQUIS) 5 MG TABS tablet Take 1 tablet (5 mg total) by mouth 2 (two) times daily. 09/09/20 10/09/20  Donne Hazel, MD  BREO ELLIPTA 200-25 MCG/INH AEPB INHALE 1 PUFF BY MOUTH EVERY DAY Patient taking differently: Inhale 1 puff into the lungs daily. 03/16/20   Kozlow,  Donnamarie Poag, MD  furosemide (LASIX) 40 MG tablet TAKE 1 TABLET BY MOUTH EVERY DAY Patient taking differently: Take 40 mg by mouth daily. 08/07/20   Minus Breeding, MD  HYDROcodone-acetaminophen (NORCO) 5-325 MG tablet Take 1-2 tablets by mouth every 6 (six) hours as needed for moderate pain. 09/09/20   Donne Hazel, MD  montelukast (SINGULAIR) 10 MG tablet TAKE 1 TABLET BY MOUTH EVERYDAY AT BEDTIME Patient taking differently: Take 10 mg by mouth at bedtime. 09/09/19   Kozlow, Donnamarie Poag, MD  pantoprazole (PROTONIX) 40 MG tablet Take 1 tablet (40 mg total) by mouth daily. 09/10/20 10/10/20  Donne Hazel, MD  Propylene Glycol (SYSTANE BALANCE OP) Place 1 drop into both eyes daily as needed (for dry eyes).    [provider]  triamcinolone (NASACORT ALLERGY 24HR) 55 MCG/ACT AERO nasal inhaler Use one spray in each nostril once daily as directed. Patient taking differently: Place 1 spray into the nose daily. 01/28/17   Kozlow, Donnamarie Poag, MD     Critical care time: 55 minutes

## 2020-10-26 NOTE — Sepsis Progress Note (Signed)
eLink is monitoring this Code Sepsis. °

## 2020-10-26 NOTE — ED Notes (Signed)
Assuming care. 2ND L LR bolus completed. Pt noted to be not wearing NRB o2 sat 100% removed. Pt skin cool to touch. Temp orally 98.4. Pt able to follow some commands, but does not answer questions. Urine in collection bag. 2nd IV placed to R hand. 18G IV @ R forearm removed. Catheter intact. Pt resting in bed @ this time w/ NAD noted. VSS. Cardiac monitoring in place w/ bp cycling and spo2 being monitored.Bed low and locked. Side rails up x2. Vanc infusing and maintaince LR bolus.

## 2020-10-26 NOTE — ED Notes (Signed)
Intensivist  made aware of current bp RN told to bolus remaining LR maintainance fluids.

## 2020-10-26 NOTE — ED Notes (Signed)
EKG completed

## 2020-10-27 ENCOUNTER — Inpatient Hospital Stay (HOSPITAL_COMMUNITY): Payer: PPO

## 2020-10-27 DIAGNOSIS — R7881 Bacteremia: Secondary | ICD-10-CM

## 2020-10-27 DIAGNOSIS — A419 Sepsis, unspecified organism: Secondary | ICD-10-CM

## 2020-10-27 DIAGNOSIS — I5032 Chronic diastolic (congestive) heart failure: Secondary | ICD-10-CM | POA: Diagnosis not present

## 2020-10-27 DIAGNOSIS — R6521 Severe sepsis with septic shock: Secondary | ICD-10-CM

## 2020-10-27 LAB — BLOOD CULTURE ID PANEL (REFLEXED) - BCID2

## 2020-10-27 LAB — ECHOCARDIOGRAM COMPLETE
AR max vel: 4.05 cm2
AV Area VTI: 3.42 cm2
AV Area mean vel: 3.63 cm2
AV Mean grad: 5 mmHg
AV Peak grad: 9.6 mmHg
Ao pk vel: 1.55 m/s
Area-P 1/2: 4.15 cm2
Height: 74 in
S' Lateral: 2.8 cm
Weight: 4000.03 oz

## 2020-10-27 LAB — MAGNESIUM
Magnesium: 1.2 mg/dL — ABNORMAL LOW (ref 1.7–2.4)
Magnesium: 1.5 mg/dL — ABNORMAL LOW (ref 1.7–2.4)

## 2020-10-27 LAB — BASIC METABOLIC PANEL
Anion gap: 20 — ABNORMAL HIGH (ref 5–15)
Anion gap: 15 (ref 5–15)
BUN: 18 mg/dL (ref 8–23)
BUN: 18 mg/dL (ref 8–23)
CO2: 18 mmol/L — ABNORMAL LOW (ref 22–32)
CO2: 18 mmol/L — ABNORMAL LOW (ref 22–32)
Calcium: 8.5 mg/dL — ABNORMAL LOW (ref 8.9–10.3)
Calcium: 7.7 mg/dL — ABNORMAL LOW (ref 8.9–10.3)
Chloride: 101 mmol/L (ref 98–111)
Chloride: 101 mmol/L (ref 98–111)
Creatinine, Ser: 2.17 mg/dL — ABNORMAL HIGH (ref 0.61–1.24)
Creatinine, Ser: 1.87 mg/dL — ABNORMAL HIGH (ref 0.61–1.24)
GFR, Estimated: 31 mL/min — ABNORMAL LOW (ref >60)
GFR, Estimated: 37 mL/min — ABNORMAL LOW (ref >60)
Glucose, Bld: 87 mg/dL (ref 70–99)
Glucose, Bld: 167 mg/dL — ABNORMAL HIGH (ref 70–99)
Potassium: 2.6 mmol/L — CL (ref 3.5–5.1)
Potassium: 3.7 mmol/L (ref 3.5–5.1)
Sodium: 139 mmol/L (ref 135–145)
Sodium: 134 mmol/L — ABNORMAL LOW (ref 135–145)

## 2020-10-27 LAB — CBC
HCT: 31.2 % — ABNORMAL LOW (ref 39.0–52.0)
Hemoglobin: 9.7 g/dL — ABNORMAL LOW (ref 13.0–17.0)
MCH: 18.8 pg — ABNORMAL LOW (ref 26.0–34.0)
MCHC: 31.1 g/dL (ref 30.0–36.0)
MCV: 60.3 fL — ABNORMAL LOW (ref 80.0–100.0)
Platelets: 208 10*3/uL (ref 150–400)
RBC: 5.17 MIL/uL (ref 4.22–5.81)
RDW: 17.3 % — ABNORMAL HIGH (ref 11.5–15.5)
WBC: 15 10*3/uL — ABNORMAL HIGH (ref 4.0–10.5)
nRBC: 0.9 % — ABNORMAL HIGH (ref 0.0–0.2)

## 2020-10-27 LAB — PROCALCITONIN
Procalcitonin: 1.18 ng/mL
Procalcitonin: >150 ng/mL

## 2020-10-27 LAB — TROPONIN I (HIGH SENSITIVITY)
Troponin I (High Sensitivity): 1741 ng/L (ref <18)
Troponin I (High Sensitivity): 2358 ng/L (ref <18)

## 2020-10-27 LAB — MRSA PCR SCREENING: MRSA by PCR: NEGATIVE

## 2020-10-27 LAB — PHOSPHORUS
Phosphorus: 1.8 mg/dL — ABNORMAL LOW (ref 2.5–4.6)
Phosphorus: 4.4 mg/dL (ref 2.5–4.6)

## 2020-10-27 LAB — APTT: aPTT: 95 seconds — ABNORMAL HIGH (ref 24–36)

## 2020-10-27 LAB — GLUCOSE, CAPILLARY: Glucose-Capillary: 157 mg/dL — ABNORMAL HIGH (ref 70–99)

## 2020-10-27 LAB — HEPARIN LEVEL (UNFRACTIONATED): Heparin Unfractionated: >1.1 IU/mL — ABNORMAL HIGH (ref 0.30–0.70)

## 2020-10-27 MED ORDER — POTASSIUM PHOSPHATES 15 MMOLE/5ML IV SOLN
30.0000 mmol | Freq: Once | INTRAVENOUS | Status: AC
  Administered 2020-10-27: 30 mmol via INTRAVENOUS
  Filled 2020-10-27: qty 10

## 2020-10-27 MED ORDER — MAGNESIUM SULFATE 2 GM/50ML IV SOLN
2.0000 g | Freq: Once | INTRAVENOUS | Status: AC
  Administered 2020-10-27: 2 g via INTRAVENOUS
  Filled 2020-10-27: qty 50

## 2020-10-27 MED ORDER — SODIUM CHLORIDE 0.9 % IV SOLN
2.0000 g | Freq: Two times a day (BID) | INTRAVENOUS | Status: DC
  Administered 2020-10-27: 2 g via INTRAVENOUS
  Filled 2020-10-27: qty 2

## 2020-10-27 MED ORDER — SODIUM CHLORIDE 0.9 % IV BOLUS
1000.0000 mL | Freq: Once | INTRAVENOUS | Status: AC
  Administered 2020-10-27: 1000 mL via INTRAVENOUS

## 2020-10-27 MED ORDER — HEPARIN SODIUM (PORCINE) 5000 UNIT/ML IJ SOLN
5000.0000 [IU] | Freq: Three times a day (TID) | INTRAMUSCULAR | Status: DC
  Administered 2020-10-27 – 2020-10-28 (×3): 5000 [IU] via SUBCUTANEOUS
  Filled 2020-10-27 (×3): qty 1

## 2020-10-27 MED ORDER — PERFLUTREN LIPID MICROSPHERE
1.0000 mL | INTRAVENOUS | Status: AC | PRN
  Administered 2020-10-27: 4 mL via INTRAVENOUS
  Filled 2020-10-27: qty 10

## 2020-10-27 MED ORDER — POTASSIUM CHLORIDE 10 MEQ/100ML IV SOLN
10.0000 meq | INTRAVENOUS | Status: AC
  Administered 2020-10-27 (×4): 10 meq via INTRAVENOUS
  Filled 2020-10-27 (×4): qty 100

## 2020-10-27 MED ORDER — OXYCODONE HCL 5 MG PO TABS
5.0000 mg | ORAL_TABLET | Freq: Four times a day (QID) | ORAL | Status: DC | PRN
  Administered 2020-10-27: 5 mg via ORAL
  Filled 2020-10-27: qty 1

## 2020-10-27 MED ORDER — CHLORHEXIDINE GLUCONATE CLOTH 2 % EX PADS
6.0000 | MEDICATED_PAD | Freq: Every day | CUTANEOUS | Status: DC
  Administered 2020-10-27 – 2020-11-01 (×6): 6 via TOPICAL

## 2020-10-27 MED ORDER — SODIUM CHLORIDE 0.9 % IV SOLN
2.0000 g | INTRAVENOUS | Status: DC
  Administered 2020-10-27 – 2020-10-29 (×3): 2 g via INTRAVENOUS
  Filled 2020-10-27 (×3): qty 20

## 2020-10-27 MED ORDER — ASPIRIN 300 MG RE SUPP
150.0000 mg | Freq: Every day | RECTAL | Status: DC
  Administered 2020-10-27: 150 mg via RECTAL
  Filled 2020-10-27 (×2): qty 1

## 2020-10-27 MED ORDER — ACETAMINOPHEN 650 MG RE SUPP
650.0000 mg | Freq: Four times a day (QID) | RECTAL | Status: DC | PRN
  Administered 2020-10-27: 650 mg via RECTAL
  Filled 2020-10-27: qty 1

## 2020-10-27 MED ORDER — VANCOMYCIN VARIABLE DOSE PER UNSTABLE RENAL FUNCTION (PHARMACIST DOSING): Status: DC

## 2020-10-27 MED ORDER — LACTATED RINGERS IV BOLUS
1000.0000 mL | Freq: Once | INTRAVENOUS | Status: AC
  Administered 2020-10-27: 1000 mL via INTRAVENOUS

## 2020-10-27 MED ORDER — HEPARIN (PORCINE) 25000 UT/250ML-% IV SOLN
1500.0000 [IU]/h | INTRAVENOUS | Status: DC
  Administered 2020-10-27: 1500 [IU]/h via INTRAVENOUS
  Filled 2020-10-27: qty 250

## 2020-10-27 NOTE — Progress Notes (Signed)
ANTICOAGULATION CONSULT NOTE  Pharmacy Consult for Heparin Indication: chest pain/ACS, h/o DVT  No Known Allergies  Patient Measurements: Height: 6\' 2"  (188 cm) Weight: 113.4 kg (250 lb) IBW/kg (Calculated) : 82.2 Heparin Dosing Weight: 107 kg  Vital Signs: Temp: 99.86 F (37.7 C) (04/29 1330) Temp Source: Bladder (04/29 0400) BP: 97/58 (04/29 1330) Pulse Rate: 81 (04/29 1330)  Labs: Recent Labs    10/26/20 1807 10/26/20 2353 10/27/20 0118 10/27/20 0126 10/27/20 0439 10/27/20 1255  HGB 11.7* 9.2* 9.7*  --   --   --   HCT 38.1* 27.0* 31.2*  --   --   --   PLT 206  --  208  --   --   --   APTT 28  --   --   --   --   --   LABPROT 23.3*  --   --   --   --   --   INR 2.1*  --   --   --   --   --   HEPARINUNFRC  --   --   --   --   --  >1.10*  CREATININE 1.74*  --  2.17*  --   --   --   TROPONINIHS  --   --   --  1,741* 2,358*  --     Estimated Creatinine Clearance: 38.8 mL/min (A) (by C-G formula based on SCr of 2.17 mg/dL (H)).   Medical History: Past Medical History:  Diagnosis Date  . Acute asthmatic bronchitis   . Anemia   . Anxiety disorder   . Borderline diabetes mellitus   . Borderline hypertension   . Chronic back pain   . Degenerative joint disease   . Depression   . GERD (gastroesophageal reflux disease)   . History of adenomatous polyp of colon   . History of sinusitis   . Posttraumatic stress disorder   . Sleep apnea    Uses CPAP    Assessment: 75 y.o. M presents from SNF being less responsive than normal, now on IV antibitoics for e coli bactermia. Pt on Eliquis pta for h/o DVT. Last dose of Eliquis confirmed to be 4/28 at 0800. Pharmacy was consulted to start IV heparin for ACS.  Initial heparin level elevated, likely due to recent Eliquis. Will dose based on aPTT until heparin level and aPTT correlated.  aPTT is therapeutic at 95 sec on 1500 units/hr. Hgb 9.7, platelets 208. No s/sx bleeding reported.  Goal of Therapy:  Heparin level  0.3-0.7 units/ml; PTT 66-102 sec Monitor platelets by anticoagulation protocol: Yes   Plan:  Continue heparin gtt at 1500 units/hr Confirmatory aPTT in 6 hours Daily heparin level, aPTT, and CBC Consider transition to [facility] if ACS ruled out F/u resuming Eliquis when appropriate  Fara Olden, PharmD PGY-1 Pharmacy Resident 10/27/2020 2:03 PM Please see AMION for all pharmacy numbers

## 2020-10-27 NOTE — Progress Notes (Signed)
PHARMACY - PHYSICIAN COMMUNICATION CRITICAL VALUE ALERT - BLOOD CULTURE IDENTIFICATION (BCID)  Shane [name] is an 75 y.o. male who presented to [facility] on 10/26/2020 with a chief complaint of altered mental status, infection of unknown source  Assessment: E.coli, no resistance in 1/1 bottles (aerobic)  Name of physician (or Provider) Contacted: Rhys Martini, MD   Current antibiotics: Cefepime  Changes to prescribed antibiotics recommended:  De-escalate to ceftriaxone 2g IV every 24 hours  Results for orders placed or performed during the hospital encounter of 10/26/20  Blood Culture ID Panel (Reflexed) (Collected: 10/26/2020  6:20 PM)  Result Value Ref Range   Enterococcus faecalis NOT DETECTED NOT DETECTED   Enterococcus Faecium NOT DETECTED NOT DETECTED   Listeria monocytogenes NOT DETECTED NOT DETECTED   Staphylococcus species NOT DETECTED NOT DETECTED   Staphylococcus aureus (BCID) NOT DETECTED NOT DETECTED   Staphylococcus epidermidis NOT DETECTED NOT DETECTED   Staphylococcus lugdunensis NOT DETECTED NOT DETECTED   Streptococcus species NOT DETECTED NOT DETECTED   Streptococcus agalactiae NOT DETECTED NOT DETECTED   Streptococcus pneumoniae NOT DETECTED NOT DETECTED   Streptococcus pyogenes NOT DETECTED NOT DETECTED   A.calcoaceticus-baumannii NOT DETECTED NOT DETECTED   Bacteroides fragilis NOT DETECTED NOT DETECTED   Enterobacterales DETECTED (A) NOT DETECTED   Enterobacter cloacae complex NOT DETECTED NOT DETECTED   Escherichia coli DETECTED (A) NOT DETECTED   Klebsiella aerogenes NOT DETECTED NOT DETECTED   Klebsiella oxytoca NOT DETECTED NOT DETECTED   Klebsiella pneumoniae NOT DETECTED NOT DETECTED   Proteus species NOT DETECTED NOT DETECTED   Salmonella species NOT DETECTED NOT DETECTED   Serratia marcescens NOT DETECTED NOT DETECTED   Haemophilus influenzae NOT DETECTED NOT DETECTED   Neisseria meningitidis NOT DETECTED NOT DETECTED   Pseudomonas  aeruginosa NOT DETECTED NOT DETECTED   Stenotrophomonas maltophilia NOT DETECTED NOT DETECTED   Candida albicans NOT DETECTED NOT DETECTED   Candida auris NOT DETECTED NOT DETECTED   Candida glabrata NOT DETECTED NOT DETECTED   Candida krusei NOT DETECTED NOT DETECTED   Candida parapsilosis NOT DETECTED NOT DETECTED   Candida tropicalis NOT DETECTED NOT DETECTED   Cryptococcus neoformans/gattii NOT DETECTED NOT DETECTED   CTX-M ESBL NOT DETECTED NOT DETECTED   Carbapenem resistance IMP NOT DETECTED NOT DETECTED   Carbapenem resistance KPC NOT DETECTED NOT DETECTED   Carbapenem resistance NDM NOT DETECTED NOT DETECTED   Carbapenem resist OXA 48 LIKE NOT DETECTED NOT DETECTED   Carbapenem resistance VIM NOT DETECTED NOT DETECTED    Mercy Riding, PharmD PGY1 Acute Care Pharmacy Resident

## 2020-10-27 NOTE — Progress Notes (Signed)
Pharmacy Electrolyte Replacement  Recent Labs:  Recent Labs    10/27/20 0118  K 2.6*  MG 1.2*  PHOS 1.8*  CREATININE 2.17*    Low Critical Values (K </= 2.5, Phos </= 1, Mg </= 1) Present: None  Plan: Patient already receiving KPhos 30 mmol (=44 mEq potassium). Repletion for potassium inadequate (K=2.6), repleted per protocol with potassium 40 mEq IV. Repeat BMP already ordered for 1200.  Fara Olden, PharmD PGY-1 Pharmacy Resident 10/27/2020 8:02 AM Please see AMION for all pharmacy numbers

## 2020-10-27 NOTE — Progress Notes (Signed)
Critical Value: potassium 2.6  Name of Provider Notified: Dr. Oletta Darter, MD  Orders Received? Or Actions Taken?: Awaiting orders

## 2020-10-27 NOTE — Progress Notes (Signed)
eLink Physician-Brief Progress Note Patient Name: Shane [name] DOB: 20-Mar-1946 MRN: [id number]   Date of Service  10/27/2020  HPI/Events of Note  Multiple issues: 1. Hypokalemia - K+ = 3.0 at 6:07 PM and not replaced and 2. Temp = 101.2 F. Blood and urine have already been cultured. Patient is already on Vancomycin and Cefepime. AST and ALT are both normal.   eICU Interventions  Plan: 1. Send AM labs now.  2. Tylenol suppository 650 mg PR Q 6 hours PRN Temp > 101.0 F.      Intervention Category Major Interventions: Electrolyte abnormality - evaluation and management  Shane [name] 10/27/2020, 12:49 AM

## 2020-10-27 NOTE — Progress Notes (Signed)
eLink Physician-Brief Progress Note Patient Name: Shane [name] DOB: 18-Oct-1945 MRN: [id number]   Date of Service  10/27/2020  HPI/Events of Note  Hypokalemia  Hypomagnesemia  Hypophosphatemia - K+ = 2.6, Mg++ = 1.2, PO4--- = 1.8 and Creatinine = 2.17  eICU Interventions  Plan: 1. Replace K+ , Mg++ and PO4---. 2. Repeat BMP, Mg++ level and Phosphorus at 12 noon.     Intervention Category Major Interventions: Electrolyte abnormality - evaluation and management  [name] Eugene 10/27/2020, 2:42 AM

## 2020-10-27 NOTE — Progress Notes (Signed)
Discontinue heparin infusion  Start subcu heparin for DVT prophylaxis  Resume full dose Eliquis if no evidence of continued bleeding from his Foley

## 2020-10-27 NOTE — Progress Notes (Signed)
eLink Physician-Brief Progress Note Patient Name: Shane [name] DOB: 11-15-1945 MRN: [id number]   Date of Service  10/27/2020  HPI/Events of Note  Multiple issues: 1. Patient arrives in ICU with chronic Foley catheter. No order for Foley catheter and 2. Troponin #1 = 1741. Patient is on Norpinephrine via PIV, therefore, can't B-Block.  eICU Interventions  Plan: 1. Continue Foley catheter. 2. ASA 150 mg per rectum now and Q day. 3. D/C Heparin [place]. 4. Heparin IV infusion per pharmacy consult.     Intervention Category Major Interventions: Other:  Yonael Tulloch Cornelia Copa 10/27/2020, 3:22 AM

## 2020-10-27 NOTE — Progress Notes (Signed)
eLink Physician-Brief Progress Note Patient Name: Shane [name] DOB: February 22, 1946 MRN: [id number]   Date of Service  10/27/2020  HPI/Events of Note  Patient c/o back pain. Patient takes Norco 5/325 mg PO Q 6 hours at home.   eICU Interventions  Plan: 1. Oxycodone IR 5 mg PO Q 6 hours PRN pain.     Intervention Category Major Interventions: Other:  Lysle Dingwall 10/27/2020, 7:53 PM

## 2020-10-27 NOTE — Progress Notes (Signed)
  Echocardiogram 2D Echocardiogram has been performed.  Cammy Brochure 10/27/2020, 2:44 PM

## 2020-10-27 NOTE — Progress Notes (Signed)
Critical Value: troponin 1741  Name of Provider Notified: Dr. Oletta Darter  Orders Received? Or Actions Taken?: awaiting orders

## 2020-10-27 NOTE — Progress Notes (Signed)
NAME:  Shane [name], MRN:  [id number], DOB:  1946-06-14, LOS: 1 ADMISSION DATE:  10/26/2020,   History of Present Illness:  This is a 75 year old black male that presented from a skilled nursing facility.  Very limited history is available of recent events.  The patient was sent to the emergency room because he was less responsive than normal and that he had hematuria after having a Foley catheter replaced yesterday.  Initially on the exam the patient answered several questions yes and no.  He answered no to pain and fevers.  After that he did not answer any further questions.  Pertinent  Medical History  Right femoral vein DVT Stage IIIa chronic kidney disease Osteoarthritis of the right knee Essential hypertension   Significant Hospital Events: Including procedures, antibiotic start and stop dates in addition to other pertinent events      Objective   Blood pressure (!) 85/54, pulse 81, temperature 99.32 F (37.4 C), resp. rate (!) 21, height 6\' 2"  (1.88 m), weight 113.4 kg, SpO2 95 %.        Intake/Output Summary (Last 24 hours) at 10/27/2020 0852 Last data filed at 10/27/2020 0800 Gross per 24 hour  Intake 6480.95 ml  Output 675 ml  Net 5805.95 ml   Filed Weights   10/26/20 1754 10/27/20 0358  Weight: 122.5 kg 113.4 kg    Examination: General: No acute distress.  Awake and interactive HENT: Dry oral mucosa Lungs: Clear breath sounds with no added sounds Cardiovascular: S1-S2 appreciated Abdomen: Bowel sounds appreciated Extremities: Changes of chronic venous stasis, trace edema  neuro: Follows commands, slow to respond GU: Foley catheter intact.  Blood at the meatus.  Labs/imaging that I havepersonally reviewed  (right click and "Reselect all SmartList Selections" daily)  Chest x-ray 4/28-no acute infiltrate, reviewed by myself Last echo was 06/19/2020-ejection fraction 65 to 55%, diastolic dysfunction  Assessment & Plan:   Septic shock -Blood culture  with gram-negative bacteria -Vancomycin and cefepime -We will continue cefepime till identification -on norepinephrine  Acute kidney injury -Maintain renal perfusion -Maintain MAP greater than 65 -Avoid nephrotoxic's -Fluid resuscitation  Chronic Foley catheter -May be source of infection -Recently had Foley replaced with associated hematuria  Elevated troponin -Likely demand related -Acute kidney injury  Metabolic encephalopathy -We will continue to monitor closely -Correct electrolytes  Hypokalemia -Continue repletion  Best practice (right click and "Reselect all SmartList Selections" daily)  Diet:  NPO Pain/Anxiety/Delirium protocol (if indicated): No VAP protocol (if indicated): Not indicated DVT prophylaxis: Subcutaneous Heparin GI prophylaxis: PPI Glucose control:  SSI No Central venous access:  N/A Arterial line:  N/A Foley:  Yes, and it is still needed Mobility:  bed rest  PT consulted: N/A Last date of multidisciplinary goals of care discussion  Code Status:  DNR Disposition: ICU  Labs   CBC: Recent Labs  Lab 10/26/20 1807 10/26/20 2353 10/27/20 0118  WBC 1.4*  --  15.0*  NEUTROABS 0.9*  --   --   HGB 11.7* 9.2* 9.7*  HCT 38.1* 27.0* 31.2*  MCV 61.2*  --  60.3*  PLT 206  --  732    Basic Metabolic Panel: Recent Labs  Lab 10/26/20 1807 10/26/20 2353 10/27/20 0118  NA 135 140 139  K 3.0* 2.6* 2.6*  CL 99  --  101  CO2 21*  --  18*  GLUCOSE 127*  --  87  BUN 11  --  18  CREATININE 1.74*  --  2.17*  CALCIUM 8.8*  --  8.5*  MG  --   --  1.2*  PHOS  --   --  1.8*   GFR: Estimated Creatinine Clearance: 38.8 mL/min (A) (by C-G formula based on SCr of 2.17 mg/dL (H)). Recent Labs  Lab 10/26/20 1807 10/26/20 1808 10/26/20 2009 10/27/20 0118  PROCALCITON 1.18  --   --  >150.00  WBC 1.4*  --   --  15.0*  LATICACIDVEN  --  5.1* 8.1*  --     Liver Function Tests: Recent Labs  Lab 10/26/20 1807  AST 23  ALT 10  ALKPHOS 147*   BILITOT 1.7*  PROT 7.0  ALBUMIN 2.9*   No results for input(s): LIPASE, AMYLASE in the last 168 hours. No results for input(s): AMMONIA in the last 168 hours.  ABG    Component Value Date/Time   PHART 7.519 (H) 10/26/2020 2353   PCO2ART 23.3 (L) 10/26/2020 2353   PO2ART 66 (L) 10/26/2020 2353   HCO3 18.7 (L) 10/26/2020 2353   TCO2 19 (L) 10/26/2020 2353   ACIDBASEDEF 3.0 (H) 10/26/2020 2353   O2SAT 94.0 10/26/2020 2353     Coagulation Profile: Recent Labs  Lab 10/26/20 1807  INR 2.1*    Cardiac Enzymes: No results for input(s): CKTOTAL, CKMB, CKMBINDEX, TROPONINI in the last 168 hours.  HbA1C: Hgb A1c MFr Bld  Date/Time Value Ref Range Status  06/18/2020 11:08 AM 5.8 (H) 4.8 - 5.6 % Final    Comment:    (NOTE) Pre diabetes:          5.7%-6.4%  Diabetes:              >6.4%  Glycemic control for   <7.0% adults with diabetes   03/01/2019 03:40 PM 6.2 4.6 - 6.5 % Final    Comment:    Glycemic Control Guidelines for People with Diabetes:Non Diabetic:  <6%Goal of Therapy: <7%Additional Action Suggested:  >8%     CBG: Recent Labs  Lab 10/26/20 1745  GLUCAP 126*    Review of Systems:   Patient unable to provide any information  Past Medical History:  He,  has a past medical history of Acute asthmatic bronchitis, Anemia, Anxiety disorder, Borderline diabetes mellitus, Borderline hypertension, Chronic back pain, Degenerative joint disease, Depression, GERD (gastroesophageal reflux disease), History of adenomatous polyp of colon, History of sinusitis, Posttraumatic stress disorder, and Sleep apnea.   Surgical History:   Past Surgical History:  Procedure Laterality Date  . COLONOSCOPY    . FINGER SURGERY Right    index  . NASAL SINUS SURGERY     x 2  . POLYPECTOMY       Social History:   reports that he quit smoking about 37 years ago. His smoking use included cigarettes. He has a 12.50 pack-year smoking history. He has never used smokeless tobacco. He  reports that he does not drink alcohol and does not use drugs.   Family History:  His family history includes Arthritis in his mother; Diabetes in his father. There is no history of Colon cancer, Colon polyps, Rectal cancer, Stomach cancer, Allergic rhinitis, Angioedema, Asthma, Eczema, Immunodeficiency, or Urticaria.   Allergies No Known Allergies   Home Medications  Prior to Admission medications   Medication Sig Start Date End Date Taking? Authorizing Provider  albuterol (VENTOLIN HFA) 108 (90 Base) MCG/ACT inhaler INHALE TWO PUFFS EVERY FOUR TO SIX HOURS AS NEEDED FOR COUGH OR WHEEZE. Patient taking differently: Inhale 2 puffs into the lungs every 4 (four) hours as needed for  wheezing (cough). 04/03/20   Kozlow, Donnamarie Poag, MD  alum & mag hydroxide-simeth [facility] MAXIMUM STRENGTH) 400-400-40 MG/5ML suspension Take 5 mLs by mouth every 6 (six) hours as needed (abdominal pain or indigestion). 06/10/19   Carmin Muskrat, MD  amLODipine (NORVASC) 10 MG tablet Take 1 tablet (10 mg total) by mouth daily. 12/28/19   Samuella Cota, MD  apixaban (ELIQUIS) 5 MG TABS tablet Take 1 tablet (5 mg total) by mouth 2 (two) times daily. 09/09/20 10/09/20  Donne Hazel, MD  BREO ELLIPTA 200-25 MCG/INH AEPB INHALE 1 PUFF BY MOUTH EVERY DAY Patient taking differently: Inhale 1 puff into the lungs daily. 03/16/20   Kozlow, Donnamarie Poag, MD  furosemide (LASIX) 40 MG tablet TAKE 1 TABLET BY MOUTH EVERY DAY Patient taking differently: Take 40 mg by mouth daily. 08/07/20   Minus Breeding, MD  HYDROcodone-acetaminophen (NORCO) 5-325 MG tablet Take 1-2 tablets by mouth every 6 (six) hours as needed for moderate pain. 09/09/20   Donne Hazel, MD  montelukast (SINGULAIR) 10 MG tablet TAKE 1 TABLET BY MOUTH EVERYDAY AT BEDTIME Patient taking differently: Take 10 mg by mouth at bedtime. 09/09/19   Kozlow, Donnamarie Poag, MD  pantoprazole (PROTONIX) 40 MG tablet Take 1 tablet (40 mg total) by mouth daily. 09/10/20 10/10/20  Donne Hazel, MD  Propylene Glycol (SYSTANE BALANCE OP) Place 1 drop into both eyes daily as needed (for dry eyes).    [provider]  triamcinolone (NASACORT ALLERGY 24HR) 55 MCG/ACT AERO nasal inhaler Use one spray in each nostril once daily as directed. Patient taking differently: Place 1 spray into the nose daily. 01/28/17   Kozlow, Donnamarie Poag, MD    The patient is critically ill with multiple organ systems failure and requires high complexity decision making for assessment and support, frequent evaluation and titration of therapies, application of advanced monitoring technologies and extensive interpretation of multiple databases. Critical Care Time devoted to patient care services described in this note independent of APP/resident time (if applicable)  is 40 minutes.   Sherrilyn Rist MD [facility] Personal pager: [phone] If unanswered, please page CCM On-call: [phone]

## 2020-10-27 NOTE — Progress Notes (Addendum)
Pharmacy Antibiotic Note  Shane [name] is a 75 y.o. male admitted on 10/26/2020 with altered mental status concerning for infection of unknown source.  Pharmacy has been consulted for Cefepime and vancomycin dosing.  WBC 15, LA 5.1>8.1, Tc 104.95F > now afeb, SCr 1.74>2.1 (BL ~ 1.3)  Cefepime 2g once given in ED yesterday evening. Cefepime 1g q12h ordered started today, will increase to 2g q12h based on renal function. Worsening AKI, may need to decrease to 2g q24h if continues to worsen.  Plan: -Cefepime 2 gm IV 12 hours (give additional 1 g dose now to equal 2 g) -Vancomycin 2 gm IV load given, pharmacy will enter additional doses based on variable renal function > consider discontinuing vancomycin if suspected source PNA (MRSA PCR negative) -Monitor CBC, renal fx, cultures and clinical progress -Vanc levels as indicated   ADDENDUM: Blood cultures growing 1/2 GNR. Per CCM, okay to stop vancomycin, will discontinue consult. Continue cefepime.  Fara Olden, PharmD PGY-1 Pharmacy Resident 10/27/2020 9:46 AM  Height: 6\' 2"  (188 cm) Weight: 113.4 kg (250 lb) IBW/kg (Calculated) : 82.2  Temp (24hrs), Avg:100.8 F (38.2 C), Min:98.4 F (36.9 C), Max:104.8 F (40.4 C)  Recent Labs  Lab 10/26/20 1807 10/26/20 1808 10/26/20 2009 10/27/20 0118  WBC 1.4*  --   --  15.0*  CREATININE 1.74*  --   --  2.17*  LATICACIDVEN  --  5.1* 8.1*  --     Estimated Creatinine Clearance: 38.8 mL/min (A) (by C-G formula based on SCr of 2.17 mg/dL (H)).    No Known Allergies  Antimicrobials this admission: Vanc 4/28 >>  Cefepime 4/28 >>   Dose adjustments this admission:  Microbiology results: 4/28 BCx: sent 4/28 UCx: sent 4/29 MRSA PCR: negative  Fara Olden, PharmD PGY-1 Pharmacy Resident 10/27/2020 8:10 AM Please see AMION for all pharmacy numbers

## 2020-10-27 NOTE — Progress Notes (Signed)
ANTICOAGULATION CONSULT NOTE - Initial Consult  Pharmacy Consult for Heparin Indication: chest pain/ACS, h/o DVT  No Known Allergies  Patient Measurements: Height: 6\' 2"  (188 cm) Weight: 122.5 kg (270 lb 1 oz) IBW/kg (Calculated) : 82.2 Heparin Dosing Weight: 107 kg  Vital Signs: Temp: 100.4 F (38 C) (04/29 0319) Temp Source: Bladder (04/29 0045) BP: 86/58 (04/29 0319) Pulse Rate: 130 (04/29 0319)  Labs: Recent Labs    10/26/20 1807 10/26/20 2353 10/27/20 0118 10/27/20 0126  HGB 11.7* 9.2* 9.7*  --   HCT 38.1* 27.0* 31.2*  --   PLT 206  --  208  --   APTT 28  --   --   --   LABPROT 23.3*  --   --   --   INR 2.1*  --   --   --   CREATININE 1.74*  --  2.17*  --   TROPONINIHS  --   --   --  1,741*    Estimated Creatinine Clearance: 40.3 mL/min (A) (by C-G formula based on SCr of 2.17 mg/dL (H)).   Medical History: Past Medical History:  Diagnosis Date  . Acute asthmatic bronchitis   . Anemia   . Anxiety disorder   . Borderline diabetes mellitus   . Borderline hypertension   . Chronic back pain   . Degenerative joint disease   . Depression   . GERD (gastroesophageal reflux disease)   . History of adenomatous polyp of colon   . History of sinusitis   . Posttraumatic stress disorder   . Sleep apnea    Uses CPAP    Medications:  Awaiting electronic med rec  Assessment: 75 y.o. M presents from SNF being less responsive than normal. Pt with h/o DVT and appears to be on Eliquis but unable to get the SNF on the phone at current time. Pt admitted to hospital at ~1700 yesterday so no Eliquis since then. INR 2.1. PTT 28 sec at baseline. Hgb 9.7, plt wnl on admission.  To begin heparin for ACS. Eliquis may be affecting heparin level so will utilize PTT for monitoring until levels correlate.  Pt received SQ heparin 5000 units ~0000.  Goal of Therapy:  Heparin level 0.3-0.7 units/ml; PTT 66-102 sec Monitor platelets by anticoagulation protocol: Yes   Plan:   Heparin gtt at 1500 units/hr. No bolus at current time until we figure Will f/u heparin level and PTT in 8 hours Daily heparin level, PTT, and CBC F/u home med rec and last dose of Eliquis  Sherlon Handing, PharmD, BCPS Please see amion for complete clinical pharmacist phone list 10/27/2020,3:27 AM

## 2020-10-27 NOTE — Progress Notes (Addendum)
eLink Physician-Brief Progress Note Patient Name: Shane [name] DOB: 07-18-1945 MRN: [id number]   Date of Service  10/27/2020  HPI/Events of Note  Nursing reports rhythm change. EKG reveals undetermined rhythm.  Long QTc. Inferior infarct , age undetermined. Now back in Sinus rhythm with HR = 91 with occasional PVC. Hypotension - BP = 77/52 with MAP = 61. Currently on Norepinephrine IV infusion via PIV at ceiling. Last LVEF = 65-70%.  eICU Interventions  Plan: 1. Cycle Troponin. 2. Bolus with LR 1 liter IV over 1 hour now.  3. Will notify  PCCM ground team of need for CVL.     Intervention Category Major Interventions: Arrhythmia - evaluation and management  Stepheny Canal [place] 10/27/2020, 1:15 AM

## 2020-10-27 NOTE — Progress Notes (Addendum)
eLink Physician-Brief Progress Note Patient Name: Shane [name] DOB: 11/22/1945 MRN: [id number]   Date of Service  10/27/2020  HPI/Events of Note  Hypotension - BP = 72/43. Patient on maximal dose of peripheral vasopression. LVEF today = 50-55%.  eICU Interventions  Plan: 1. Bolus with  0.9 NaCl 1 liter IV over 1 hour now. 2. Will request that the PCCM ground team evaluate the patient for CVL placement.      Intervention Category Major Interventions: Hypotension - evaluation and management  Lysle Dingwall 10/27/2020, 10:34 PM

## 2020-10-28 DIAGNOSIS — R6521 Severe sepsis with septic shock: Secondary | ICD-10-CM | POA: Diagnosis not present

## 2020-10-28 DIAGNOSIS — A419 Sepsis, unspecified organism: Secondary | ICD-10-CM | POA: Diagnosis not present

## 2020-10-28 DIAGNOSIS — A4151 Sepsis due to Escherichia coli [E. coli]: Secondary | ICD-10-CM

## 2020-10-28 LAB — BASIC METABOLIC PANEL
Anion gap: 15 (ref 5–15)
BUN: 18 mg/dL (ref 8–23)
CO2: 20 mmol/L — ABNORMAL LOW (ref 22–32)
Calcium: 7.5 mg/dL — ABNORMAL LOW (ref 8.9–10.3)
Chloride: 101 mmol/L (ref 98–111)
Creatinine, Ser: 1.53 mg/dL — ABNORMAL HIGH (ref 0.61–1.24)
GFR, Estimated: 47 mL/min — ABNORMAL LOW (ref >60)
Glucose, Bld: 139 mg/dL — ABNORMAL HIGH (ref 70–99)
Potassium: 4 mmol/L (ref 3.5–5.1)
Sodium: 136 mmol/L (ref 135–145)

## 2020-10-28 LAB — CBC
HCT: 26.7 % — ABNORMAL LOW (ref 39.0–52.0)
Hemoglobin: 8.8 g/dL — ABNORMAL LOW (ref 13.0–17.0)
MCH: 19.2 pg — ABNORMAL LOW (ref 26.0–34.0)
MCHC: 33 g/dL (ref 30.0–36.0)
MCV: 58.3 fL — ABNORMAL LOW (ref 80.0–100.0)
Platelets: 169 10*3/uL (ref 150–400)
RBC: 4.58 MIL/uL (ref 4.22–5.81)
RDW: 16.5 % — ABNORMAL HIGH (ref 11.5–15.5)
WBC: 26.1 10*3/uL — ABNORMAL HIGH (ref 4.0–10.5)
nRBC: 0 % (ref 0.0–0.2)

## 2020-10-28 LAB — PROCALCITONIN: Procalcitonin: 146.62 ng/mL

## 2020-10-28 LAB — GLUCOSE, CAPILLARY
Glucose-Capillary: 99 mg/dL (ref 70–99)
Glucose-Capillary: 145 mg/dL — ABNORMAL HIGH (ref 70–99)

## 2020-10-28 MED ORDER — APIXABAN 5 MG PO TABS
5.0000 mg | ORAL_TABLET | Freq: Two times a day (BID) | ORAL | Status: DC
  Administered 2020-10-28 – 2020-10-29 (×3): 5 mg via ORAL
  Filled 2020-10-28 (×3): qty 1

## 2020-10-28 NOTE — Progress Notes (Signed)
NAME:  Shane [name], MRN:  [id number], DOB:  1946/02/19, LOS: 2 ADMISSION DATE:  10/26/2020,   History of Present Illness:  This is a 75 year old black male that presented from a skilled nursing facility.  Very limited history is available of recent events.  The patient was sent to the emergency room because he was less responsive than normal and that he had hematuria after having a Foley catheter replaced yesterday.  Initially on the exam the patient answered several questions yes and no.  He answered no to pain and fevers.  After that he did not answer any further questions.   Pertinent  Medical History  Right femoral vein DVT Stage IIIa chronic kidney disease Osteoarthritis of the right knee Essential hypertension  Significant Hospital Events: Including procedures, antibiotic start and stop dates in addition to other pertinent events   .  E. coli bacteremia, currently on Rocephin .  Gram-negative rods in urine  Objective   Blood pressure (!) 93/54, pulse 74, temperature 98.78 F (37.1 C), resp. rate 17, height 6\' 2"  (1.88 m), weight 115 kg, SpO2 98 %.        Intake/Output Summary (Last 24 hours) at 10/28/2020 0830 Last data filed at 10/28/2020 0600 Gross per 24 hour  Intake 3035.97 ml  Output 1310 ml  Net 1725.97 ml   Filed Weights   10/26/20 1754 10/27/20 0358 10/28/20 0500  Weight: 122.5 kg 113.4 kg 115 kg    Examination: General: In no acute distress, awake and interactive HENT: Dry oral mucosa Lungs: Clear breath sounds bilaterally Cardiovascular: S1-S2 appreciated Abdomen: Bowel sounds appreciated Extremities: Changes of chronic venous stasis bilateral lower extremity neuro: Follows commands GU: Foley catheter in place, yellow urine  Labs/imaging that I havepersonally reviewed  (right click and "Reselect all SmartList Selections" daily)  Chest x-ray 4/28-no acute infiltrate, reviewed by myself Last echo was 06/19/2020-ejection fraction 65 to 58%,  diastolic dysfunction Repeat echocardiogram showed ejection fraction of 50 to 55%, left ventricle with wall motion abnormalities.  Diastolic dysfunction  Assessment & Plan:   Septic shock -Cultures show E. coli, currently on Rocephin -Weaned off pressors this morning -Gram-negative organisms likely from urinary source  Acute kidney injury -Maintain renal perfusion -Maintain MAP greater than 65 -Avoid nephrotoxic's -Fluid resuscitation as needed  Patient with chronic Foley catheter -This may be source of infection, was recently replaced -Foley replacement did have associated hematuria  Elevated troponin -Felt to be demand related -Related to acute kidney injury -No significant change in EKG compared to previous -Echocardiogram did show reduction in his ejection fraction .  Denies any chest pains or chest discomfort .  Denies any shortness of breath -We will ask for cardiology opinion   Metabolic encephalopathy -Appears to be improving  Hypokalemia -Continue repletion   Not having any significant bleeding from his Foley Will resume Eliquis-was on Eliquis for DVT  Best practice (right click and "Reselect all SmartList Selections" daily)  Diet:  Oral Pain/Anxiety/Delirium protocol (if indicated): Yes, required oxycodone for pain VAP protocol (if indicated): Not indicated DVT prophylaxis: Systemic AC GI prophylaxis: PPI Glucose control:  SSI No Central venous access:  N/A Arterial line:  N/A Foley:  Yes, and it is still needed Mobility:  bed rest  PT consulted: N/A Last date of multidisciplinary goals of care discussion  Code Status:  DNR Disposition: ICU  Labs   CBC: Recent Labs  Lab 10/26/20 1807 10/26/20 2353 10/27/20 0118 10/28/20 0042  WBC 1.4*  --  15.0* 26.1*  NEUTROABS 0.9*  --   --   --   HGB 11.7* 9.2* 9.7* 8.8*  HCT 38.1* 27.0* 31.2* 26.7*  MCV 61.2*  --  60.3* 58.3*  PLT 206  --  208 314    Basic Metabolic Panel: Recent Labs  Lab  10/26/20 1807 10/26/20 2353 10/27/20 0118 10/27/20 1255 10/28/20 0042  NA 135 140 139 134* 136  K 3.0* 2.6* 2.6* 3.7 4.0  CL 99  --  101 101 101  CO2 21*  --  18* 18* 20*  GLUCOSE 127*  --  87 167* 139*  BUN 11  --  18 18 18   CREATININE 1.74*  --  2.17* 1.87* 1.53*  CALCIUM 8.8*  --  8.5* 7.7* 7.5*  MG  --   --  1.2* 1.5*  --   PHOS  --   --  1.8* 4.4  --    GFR: Estimated Creatinine Clearance: 55.4 mL/min (A) (by C-G formula based on SCr of 1.53 mg/dL (H)). Recent Labs  Lab 10/26/20 1807 10/26/20 1808 10/26/20 2009 10/27/20 0118 10/28/20 0042  PROCALCITON 1.18  --   --  >150.00 146.62  WBC 1.4*  --   --  15.0* 26.1*  LATICACIDVEN  --  5.1* 8.1*  --   --     Liver Function Tests: Recent Labs  Lab 10/26/20 1807  AST 23  ALT 10  ALKPHOS 147*  BILITOT 1.7*  PROT 7.0  ALBUMIN 2.9*   No results for input(s): LIPASE, AMYLASE in the last 168 hours. No results for input(s): AMMONIA in the last 168 hours.  ABG    Component Value Date/Time   PHART 7.519 (H) 10/26/2020 2353   PCO2ART 23.3 (L) 10/26/2020 2353   PO2ART 66 (L) 10/26/2020 2353   HCO3 18.7 (L) 10/26/2020 2353   TCO2 19 (L) 10/26/2020 2353   ACIDBASEDEF 3.0 (H) 10/26/2020 2353   O2SAT 94.0 10/26/2020 2353     Coagulation Profile: Recent Labs  Lab 10/26/20 1807  INR 2.1*    Cardiac Enzymes: No results for input(s): CKTOTAL, CKMB, CKMBINDEX, TROPONINI in the last 168 hours.  HbA1C: Hgb A1c MFr Bld  Date/Time Value Ref Range Status  06/18/2020 11:08 AM 5.8 (H) 4.8 - 5.6 % Final    Comment:    (NOTE) Pre diabetes:          5.7%-6.4%  Diabetes:              >6.4%  Glycemic control for   <7.0% adults with diabetes   03/01/2019 03:40 PM 6.2 4.6 - 6.5 % Final    Comment:    Glycemic Control Guidelines for People with Diabetes:Non Diabetic:  <6%Goal of Therapy: <7%Additional Action Suggested:  >8%     CBG: Recent Labs  Lab 10/26/20 1745 10/27/20 1529 10/28/20 0707  GLUCAP 126* 157* 99     Review of Systems:   Patient unable to provide any information  Past Medical History:  He,  has a past medical history of Acute asthmatic bronchitis, Anemia, Anxiety disorder, Borderline diabetes mellitus, Borderline hypertension, Chronic back pain, Degenerative joint disease, Depression, GERD (gastroesophageal reflux disease), History of adenomatous polyp of colon, History of sinusitis, Posttraumatic stress disorder, and Sleep apnea.   Surgical History:   Past Surgical History:  Procedure Laterality Date  . COLONOSCOPY    . FINGER SURGERY Right    index  . NASAL SINUS SURGERY     x 2  . POLYPECTOMY  Social History:   reports that he quit smoking about 37 years ago. His smoking use included cigarettes. He has a 12.50 pack-year smoking history. He has never used smokeless tobacco. He reports that he does not drink alcohol and does not use drugs.   Family History:  His family history includes Arthritis in his mother; Diabetes in his father. There is no history of Colon cancer, Colon polyps, Rectal cancer, Stomach cancer, Allergic rhinitis, Angioedema, Asthma, Eczema, Immunodeficiency, or Urticaria.   Allergies No Known Allergies   Home Medications  Prior to Admission medications   Medication Sig Start Date End Date Taking? Authorizing Provider  albuterol (VENTOLIN HFA) 108 (90 Base) MCG/ACT inhaler INHALE TWO PUFFS EVERY FOUR TO SIX HOURS AS NEEDED FOR COUGH OR WHEEZE. Patient taking differently: Inhale 2 puffs into the lungs every 4 (four) hours as needed for wheezing (cough). 04/03/20   Kozlow, Donnamarie Poag, MD  alum & mag hydroxide-simeth [facility] MAXIMUM STRENGTH) 400-400-40 MG/5ML suspension Take 5 mLs by mouth every 6 (six) hours as needed (abdominal pain or indigestion). 06/10/19   Carmin Muskrat, MD  amLODipine (NORVASC) 10 MG tablet Take 1 tablet (10 mg total) by mouth daily. 12/28/19   Samuella Cota, MD  apixaban (ELIQUIS) 5 MG TABS tablet Take 1 tablet (5 mg  total) by mouth 2 (two) times daily. 09/09/20 10/09/20  Donne Hazel, MD  BREO ELLIPTA 200-25 MCG/INH AEPB INHALE 1 PUFF BY MOUTH EVERY DAY Patient taking differently: Inhale 1 puff into the lungs daily. 03/16/20   Kozlow, Donnamarie Poag, MD  furosemide (LASIX) 40 MG tablet TAKE 1 TABLET BY MOUTH EVERY DAY Patient taking differently: Take 40 mg by mouth daily. 08/07/20   Minus Breeding, MD  HYDROcodone-acetaminophen (NORCO) 5-325 MG tablet Take 1-2 tablets by mouth every 6 (six) hours as needed for moderate pain. 09/09/20   Donne Hazel, MD  montelukast (SINGULAIR) 10 MG tablet TAKE 1 TABLET BY MOUTH EVERYDAY AT BEDTIME Patient taking differently: Take 10 mg by mouth at bedtime. 09/09/19   Kozlow, Donnamarie Poag, MD  pantoprazole (PROTONIX) 40 MG tablet Take 1 tablet (40 mg total) by mouth daily. 09/10/20 10/10/20  Donne Hazel, MD  Propylene Glycol (SYSTANE BALANCE OP) Place 1 drop into both eyes daily as needed (for dry eyes).    [provider]  triamcinolone (NASACORT ALLERGY 24HR) 55 MCG/ACT AERO nasal inhaler Use one spray in each nostril once daily as directed. Patient taking differently: Place 1 spray into the nose daily. 01/28/17   Kozlow, Donnamarie Poag, MD    The patient is critically ill with multiple organ systems failure and requires high complexity decision making for assessment and support, frequent evaluation and titration of therapies, application of advanced monitoring technologies and extensive interpretation of multiple databases. Critical Care Time devoted to patient care services described in this note independent of APP/resident time (if applicable)  is 32 minutes.   Sherrilyn Rist MD [facility] Personal pager: [phone] If unanswered, please page CCM On-call: [phone]

## 2020-10-28 NOTE — Consult Note (Addendum)
Cardiology Consultation:   Patient ID: Shane [name] MRN: [id number]; DOB: 02/07/1946  Admit date: 10/26/2020 Date of Consult: 10/28/2020  PCP:  Pcp, No   [place]  Cardiologist:  Minus Breeding, MD  Advanced Practice Provider:  No care team member to display Electrophysiologist:  None    Patient Profile:   Shane [name] is a 75 y.o. 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   History of Present Illness:   Shane [name] started seeing Dr Percival Spanish outpatient since 02/11/2018 for evaluation of heart palpitation. He reported hx of heart attack in the past and was seen at [place]. Records showed a negative exercise treadmill in 2016, but poor exercise tolerance was reported. He was placed on Holter monitor which showed bradycardia, PVCs, PACs,  and questionable runs of high degree heart block. He was recommend POET for evaluation of chronotropic competence, but failed to follow up. He had a murmur which Echo is completed 09/02/18 showed normal LV systolic function, EF [id number], moderate basal septal hypertrophy, RV normal, no valvular disease. He was lost to follow up with cardiology for some time.    On 12/26/19, he was admitted for left shoulder pain and hypertensive urgency with BP 232/178, required nitro gtt. Cardiology was consulted during that hospitalization, he had negative troponin, CTA did show aortic atherosclerosis, and Echo repeated 6/26/21showed EF 65-70%, no RWMA, moderate LVH of septal segment, indeterminate diastolic function, trivial MR. It was felt his left shoulder pain is not cardiac in origin but referred from left perinephric stranding on CT and MSK in nature. He was transitioned to amlodipine for BP control and recommended outpatient stress test.   He then followed up in the office  02/04/2020, it was felt he is not very aware of his medical situations, forgets to take his meds and was trying to be more complaints with salt restriction. He denied having any chest pain. No ischemic workup was felt needed at the time.   He followed up last in the office 06/06/2020, he was noted weight gain of 30 pounds, reported eating more, no chest pain complaints but occasional palpitation. His BP was mildly elevated. He was given 4 days lasix for leg swelling but felt overall not in left sided heart failure. Echo repeated 06/19/20 with grade I DD, no RWMA, LVH, EF 65-70%, trivial MR.   He was hospitalized in March 2022 for knee pain, housing insecurity, and weakness, was found non-compliant for Eliquis, doppler showed chronic DVTs of RLE, CTA negative for PE, eliquis was re-started, and he was discharged to SNF due to inability to ambulate. He was sent in from SNF on 10/26/20 for evaluation of lethargy, hematuria in Foley, and hypotension on 10/26/20. He was found to be in septic shock with encephalopathy, hypotension, AKI 2/2 E coli bacteremia. He is admitted to ICU, required norepinephrine support, IVF resuscitation, and board spectrum antibiotic. He did not require mechanical ventilation. He was on heparin gtt substituting Eliquis and Eliquis has been resumed now. Hs trop was elevated 1741 at admission and uptrending to 2358, Echo 4/29  showed inferoseptal hypokinesis, EF 50-55%, LV low normal function, moderate LVH, grade I DD, RV normal size and systolic function, no valvular disease. Cardiology is consulted for abnormal Echo.   During encounter, patient is easily awakened from  sleep. He is oriented to person, place, time. He is very slow to respond to questions and is a limited historian. He states he has not been feeling well. He reports SOB while laying flat and with exertional activities. He denied any chest pain or pressure feeling. He continue to have sensation of irregular heart beat from time to  time, with some dizziness intermittently. He reports some left sided flank pain.   He does not know if he has gained a lot weight. He endorses leg swelling, has been taking lasix at SNF, unaware if they are better. He is essentially bed bound, has not been ambulating at SNF due to profound weakness. He states he had Foley for "a while" now due to inability to urinate.     Past Medical History:  Diagnosis Date  . Acute asthmatic bronchitis   . Anemia   . Anxiety disorder   . Borderline diabetes mellitus   . Borderline hypertension   . Chronic back pain   . Degenerative joint disease   . Depression   . GERD (gastroesophageal reflux disease)   . History of adenomatous polyp of colon   . History of sinusitis   . Posttraumatic stress disorder   . Sleep apnea    Uses CPAP    Past Surgical History:  Procedure Laterality Date  . COLONOSCOPY    . FINGER SURGERY Right    index  . NASAL SINUS SURGERY     x 2  . POLYPECTOMY       Home Medications:  Prior to Admission medications   Medication Sig Start Date End Date Taking? Authorizing Provider  albuterol (VENTOLIN HFA) 108 (90 Base) MCG/ACT inhaler INHALE TWO PUFFS EVERY FOUR TO SIX HOURS AS NEEDED FOR COUGH OR WHEEZE. Patient taking differently: Inhale 2 puffs into the lungs every 4 (four) hours as needed for wheezing (cough). 04/03/20  Yes Kozlow, Alvira Philips, MD  alum & mag hydroxide-simeth [facility] MAXIMUM STRENGTH) 400-400-40 MG/5ML suspension Take 5 mLs by mouth every 6 (six) hours as needed (abdominal pain or indigestion). 06/10/19  Yes Gerhard Munch, MD  amLODipine (NORVASC) 10 MG tablet Take 1 tablet (10 mg total) by mouth daily. 12/28/19  Yes Standley Brooking, MD  apixaban (ELIQUIS) 5 MG TABS tablet Take 1 tablet (5 mg total) by mouth 2 (two) times daily. 09/09/20 10/09/20 Yes Jerald Kief, MD  chlorhexidine (PERIDEX) 0.12 % solution 5 mLs by Mouth Rinse route 2 (two) times daily. 10/24/20  Yes [provider]   fluticasone-salmeterol (ADVAIR) 500-50 MCG/ACT AEPB Inhale 1 puff into the lungs in the morning and at bedtime.   Yes [provider]  furosemide (LASIX) 40 MG tablet TAKE 1 TABLET BY MOUTH EVERY DAY Patient taking differently: Take 40 mg by mouth daily. 08/07/20  Yes Rollene Rotunda, MD  HYDROcodone-acetaminophen (NORCO) 5-325 MG tablet Take 1-2 tablets by mouth every 6 (six) hours as needed for moderate pain. 09/09/20  Yes Jerald Kief, MD  montelukast (SINGULAIR) 10 MG tablet TAKE 1 TABLET BY MOUTH EVERYDAY AT BEDTIME Patient taking differently: Take 10 mg by mouth at bedtime. 09/09/19  Yes Kozlow, Alvira Philips, MD  nitrofurantoin, macrocrystal-monohydrate, (MACROBID) 100 MG capsule Take 100 mg by mouth 2 (two) times daily. 10/25/20  Yes [provider]  pantoprazole (PROTONIX) 40 MG tablet Take 1 tablet (40 mg total) by mouth daily. 09/10/20 10/10/20 Yes Jerald Kief, MD  phenazopyridine (PYRIDIUM) 100 MG tablet Take 100 mg by mouth 3 (three) times daily  with meals. For pain   Yes [provider]  Propylene Glycol (SYSTANE BALANCE OP) Place 1 drop into both eyes daily as needed (for dry eyes).   Yes [provider]  tamsulosin (FLOMAX) 0.4 MG CAPS capsule Take 0.4 mg by mouth daily. 10/25/20  Yes [provider]  triamcinolone (NASACORT ALLERGY 24HR) 55 MCG/ACT AERO nasal inhaler Use one spray in each nostril once daily as directed. Patient taking differently: Place 1 spray into the nose daily. 01/28/17  Yes Kozlow, Alvira PhilipsEric J, MD  BREO ELLIPTA 200-25 MCG/INH AEPB INHALE 1 PUFF BY MOUTH EVERY DAY Patient not taking: Reported on 10/27/2020 03/16/20   Jessica PriestKozlow, Eric J, MD    Inpatient Medications: Scheduled Meds: . apixaban  5 mg Oral BID  . Chlorhexidine Gluconate Cloth  6 each Topical Q0600  . pantoprazole (PROTONIX) IV  40 mg Intravenous QHS   Continuous Infusions: . sodium chloride 10 mL/hr at 10/27/20 2038  . cefTRIAXone (ROCEPHIN)  IV Stopped (10/27/20  2106)  . lactated ringers     And  . lactated ringers    . norepinephrine (LEVOPHED) Adult infusion Stopped (10/28/20 0533)   PRN Meds: acetaminophen, docusate sodium, ondansetron (ZOFRAN) IV, oxyCODONE, polyethylene glycol  Allergies:   No Known Allergies  Social History:   Social History   Socioeconomic History  . Marital status: Single    Spouse name: Not on file  . Number of children: 1  . Years of education: Not on file  . Highest education level: Not on file  Occupational History  . Occupation: former Psychologist, occupationalwelder for [employer]    Employer: [employer]  Tobacco Use  . Smoking status: Former Smoker    Packs/day: 0.50    Years: 25.00    Pack years: 12.50    Types: Cigarettes    Quit date: 07/02/1983    Years since quitting: 37.3  . Smokeless tobacco: Never Used  Vaping Use  . Vaping Use: Never used  Substance and Sexual Activity  . Alcohol use: No  . Drug use: No  . Sexual activity: Not on file  Other Topics Concern  . Not on file  Social History Narrative   Lives alone.  One child.    Social Determinants of Health   Financial Resource Strain: Not on file  Food Insecurity: Not on file  Transportation Needs: Not on file  Physical Activity: Not on file  Stress: Not on file  Social Connections: Not on file  Intimate Partner Violence: Not on file    Family History:    Family History  Problem Relation Age of Onset  . Diabetes Father   . Arthritis Mother        Unknown cause of death  . Colon cancer Neg Hx   . Colon polyps Neg Hx   . Rectal cancer Neg Hx   . Stomach cancer Neg Hx   . Allergic rhinitis Neg Hx   . Angioedema Neg Hx   . Asthma Neg Hx   . Eczema Neg Hx   . Immunodeficiency Neg Hx   . Urticaria Neg Hx      ROS:   Constitutional: Denied fever, chills, malaise, night sweats Eyes: Denied vision change or loss Ears/Nose/Mouth/Throat: Denied ear ache, sore throat, coughing, sinus pain Cardiovascular: see HPI Respiratory: see HPI   Gastrointestinal: Denied nausea, vomiting, abdominal pain, diarrhea Genital/Urinary: see HPI  Musculoskeletal: see HPI  Skin: Denied rash, wound Neuro: see HPI  Psych: Denied history of depression/anxiety  Endocrine: Denied history of diabetes  Physical Exam/Data:   Vitals:   10/28/20 0800 10/28/20 0900 10/28/20 1000 10/28/20 1100  BP: (!) 90/54 (!) 73/63 (!) 97/59 101/60  Pulse: 75 82 76 75  Resp: 19 (!) 27 20 18   Temp: 98.78 F (37.1 C) 98.78 F (37.1 C) 98.96 F (37.2 C) 98.96 F (37.2 C)  TempSrc: Bladder     SpO2: 100% 99% 100% 97%  Weight:      Height:        Intake/Output Summary (Last 24 hours) at 10/28/2020 1205 Last data filed at 10/28/2020 0800 Gross per 24 hour  Intake 1849.78 ml  Output 1460 ml  Net 389.78 ml   Last 3 Weights 10/28/2020 10/27/2020 10/26/2020  Weight (lbs) 253 lb 8.5 oz 250 lb 270 lb 1 oz  Weight (kg) 115 kg 113.4 kg 122.5 kg     Body mass index is 32.55 kg/m.    Vitals:  Vitals:   10/28/20 1000 10/28/20 1100  BP: (!) 97/59 101/60  Pulse: 76 75  Resp: 20 18  Temp: 98.96 F (37.2 C) 98.96 F (37.2 C)  SpO2: 100% 97%   General Appearance: In no apparent distress, laying in bed HEENT: Normocephalic, atraumatic. EOMs intact.  Oropharynx is dry.  Neck: Supple, trachea midline, no JVDs Cardiovascular: Regular rate and rhythm, normal S1-S2,  no murmur/rub/gallop Respiratory: Resting breathing unlabored, lungs sounds clear to auscultation bilaterally, no use of accessory muscles. On room air.  No wheezes, rales or rhonchi.   Gastrointestinal: Bowel sounds positive, abdomen obese, soft, non-tender, non-distended. Extremities: Able to move all extremities in bed without difficulty, chronic non-pitting edema of BLE Genitourinary: Foley with cloudy urine with sediment Musculoskeletal: Normal muscle bulk and tone, profound generalized weakness noted  Skin: Intact, warm, dry. No rashes or petechiae noted in exposed areas.  Neurologic: Alert,  oriented to person, place and time. Fluent speech, no cognitive deficit,  no gross focal neuro deficit Psychiatric: Normal affect. Mood is appropriate.    EKG:  The EKG was personally reviewed and demonstrates:  Initial EKGs from 4/28 possible sinus tachycardia 120s, artifacts limiting review. Repeat EKG 4/29 sinus rhythm with rate of 97 bpm, first degree AV block, and QT not prolonged upon manual calculation.    Telemetry:  Telemetry was personally reviewed and demonstrates:  Sinus rhythm with HR 80s, paired PVCs, first degree AV block    Relevant CV Studies:  Echo from 10/27/20:  1. Inferior and inferoseptal hypokinesis. Left ventricular ejection  fraction, by estimation, is 50 to 55%. The left ventricle has low normal  function. The left ventricle demonstrates regional wall motion  abnormalities (see scoring diagram/findings for  description). There is moderate concentric left ventricular hypertrophy.  Left ventricular diastolic parameters are consistent with Grade I  diastolic dysfunction (impaired relaxation).  2. Right ventricular systolic function is normal. The right ventricular  size is normal.  3. The mitral valve is normal in structure. No evidence of mitral valve  regurgitation. No evidence of mitral stenosis.  4. The aortic valve is tricuspid. Aortic valve regurgitation is not  visualized. No aortic stenosis is present.  5. The inferior vena cava is normal in size with greater than 50%  respiratory variability, suggesting right atrial pressure of 3 mmHg.    Echo from 06/19/20:  1. Technically difficult study with limited echo windows and no IV access  for contrast  2. Left ventricular ejection fraction, by estimation, is 65 to 70%. The  left ventricle has normal function. The left ventricle has no  regional  wall motion abnormalities. There is severe left ventricular hypertrophy.  Left ventricular diastolic parameters  are consistent with Grade I diastolic  dysfunction (impaired relaxation).  3. Right ventricular systolic function is low normal. The right  ventricular size is normal. Tricuspid regurgitation signal is inadequate  for assessing PA pressure.  4. The mitral valve is grossly normal. Trivial mitral valve  regurgitation.  5. The aortic valve is tricuspid. Aortic valve regurgitation is not  visualized.  6. Aortic dilatation noted. There is borderline dilatation of the aortic  root, measuring 38 mm.    Laboratory Data:  High Sensitivity Troponin:   Recent Labs  Lab 10/27/20 0126 10/27/20 0439  TROPONINIHS 1,741* 2,358*     Chemistry Recent Labs  Lab 10/27/20 0118 10/27/20 1255 10/28/20 0042  NA 139 134* 136  K 2.6* 3.7 4.0  CL 101 101 101  CO2 18* 18* 20*  GLUCOSE 87 167* 139*  BUN 18 18 18   CREATININE 2.17* 1.87* 1.53*  CALCIUM 8.5* 7.7* 7.5*  GFRNONAA 31* 37* 47*  ANIONGAP 20* 15 15    Recent Labs  Lab 10/26/20 1807  PROT 7.0  ALBUMIN 2.9*  AST 23  ALT 10  ALKPHOS 147*  BILITOT 1.7*   Hematology Recent Labs  Lab 10/26/20 1807 10/26/20 2353 10/27/20 0118 10/28/20 0042  WBC 1.4*  --  15.0* 26.1*  RBC 6.23*  --  5.17 4.58  HGB 11.7* 9.2* 9.7* 8.8*  HCT 38.1* 27.0* 31.2* 26.7*  MCV 61.2*  --  60.3* 58.3*  MCH 18.8*  --  18.8* 19.2*  MCHC 30.7  --  31.1 33.0  RDW 17.8*  --  17.3* 16.5*  PLT 206  --  208 169   BNPNo results for input(s): BNP, PROBNP in the last 168 hours.  DDimer No results for input(s): DDIMER in the last 168 hours.   Radiology/Studies:  DG Chest Port 1 View  Result Date: 10/26/2020 CLINICAL DATA:  Blood in urine back. Eliquis for history of DVT. Unresponsive, tachycardic, and hypertensive. EXAM: PORTABLE CHEST 1 VIEW COMPARISON:  09/22/2020 FINDINGS: Shallow inspiration. Heart size and pulmonary vascularity are normal for inspiratory effort. No focal consolidation or airspace disease in the lungs. No pleural effusions. No pneumothorax. Mediastinal contours appear intact.  Degenerative changes in the spine. IMPRESSION: No active disease. Electronically Signed   By: Lucienne Capers M.D.   On: 10/26/2020 19:19   ECHOCARDIOGRAM COMPLETE  Result Date: 10/27/2020    ECHOCARDIOGRAM REPORT   Patient Name:   Shane [name] Date of Exam: 10/27/2020 Medical Rec #:  [id number]          Height:       74.0 in Accession #:    [id number]         Weight:       250.0 lb Date of Birth:  1945-08-11          BSA:          2.390 m Patient Age:    85 years           BP:           97/58 mmHg Patient Gender: M                  HR:           81 bpm. Exam Location:  Inpatient Procedure: 2D Echo, Cardiac Doppler and Color Doppler Indications:    CHF  History:        Patient  has prior history of Echocardiogram examinations, most                 recent 06/19/2020. Risk Factors:Hypertension and Diabetes.  Sonographer:    Cammy Brochure Referring Phys: [id number] ADEWALE A Ander Slade  Sonographer Comments: Image acquisition challenging due to patient body habitus and Image acquisition challenging due to respiratory motion. IMPRESSIONS  1. Inferior and inferoseptal hypokinesis. Left ventricular ejection fraction, by estimation, is 50 to 55%. The left ventricle has low normal function. The left ventricle demonstrates regional wall motion abnormalities (see scoring diagram/findings for description). There is moderate concentric left ventricular hypertrophy. Left ventricular diastolic parameters are consistent with Grade I diastolic dysfunction (impaired relaxation).  2. Right ventricular systolic function is normal. The right ventricular size is normal.  3. The mitral valve is normal in structure. No evidence of mitral valve regurgitation. No evidence of mitral stenosis.  4. The aortic valve is tricuspid. Aortic valve regurgitation is not visualized. No aortic stenosis is present.  5. The inferior vena cava is normal in size with greater than 50% respiratory variability, suggesting right atrial pressure of 3 mmHg.  FINDINGS  Left Ventricle: Inferior and inferoseptal hypokinesis. Left ventricular ejection fraction, by estimation, is 50 to 55%. The left ventricle has low normal function. The left ventricle demonstrates regional wall motion abnormalities. The left ventricular internal cavity size was normal in size. There is moderate concentric left ventricular hypertrophy. Left ventricular diastolic parameters are consistent with Grade I diastolic dysfunction (impaired relaxation). Indeterminate filling pressures. Right Ventricle: The right ventricular size is normal. No increase in right ventricular wall thickness. Right ventricular systolic function is normal. Left Atrium: Left atrial size was normal in size. Right Atrium: Right atrial size was normal in size. Pericardium: There is no evidence of pericardial effusion. Mitral Valve: The mitral valve is normal in structure. No evidence of mitral valve regurgitation. No evidence of mitral valve stenosis. Tricuspid Valve: The tricuspid valve is normal in structure. Tricuspid valve regurgitation is mild . No evidence of tricuspid stenosis. Aortic Valve: The aortic valve is tricuspid. Aortic valve regurgitation is not visualized. No aortic stenosis is present. Aortic valve mean gradient measures 5.0 mmHg. Aortic valve peak gradient measures 9.6 mmHg. Aortic valve area, by VTI measures 3.42 cm. Pulmonic Valve: The pulmonic valve was normal in structure. Pulmonic valve regurgitation is trivial. No evidence of pulmonic stenosis. Aorta: The aortic root is normal in size and structure. Venous: The inferior vena cava is normal in size with greater than 50% respiratory variability, suggesting right atrial pressure of 3 mmHg. IAS/Shunts: No atrial level shunt detected by color flow Doppler.  LEFT VENTRICLE PLAX 2D LVIDd:         4.20 cm  Diastology LVIDs:         2.80 cm  LV e' medial:    6.74 cm/s LV PW:         1.40 cm  LV E/e' medial:  10.1 LV IVS:        1.60 cm  LV e' lateral:   10.60  cm/s LVOT diam:     2.30 cm  LV E/e' lateral: 6.4 LV SV:         81 LV SV Index:   34 LVOT Area:     4.15 cm  RIGHT VENTRICLE RV S prime:     11.10 cm/s TAPSE (M-mode): 2.0 cm LEFT ATRIUM             Index  RIGHT ATRIUM           Index LA diam:        3.80 cm 1.59 cm/m  RA Area:     19.00 cm LA Vol (A2C):   43.6 ml 18.25 ml/m RA Volume:   50.50 ml  21.13 ml/m LA Vol (A4C):   32.3 ml 13.52 ml/m LA Biplane Vol: 38.1 ml 15.94 ml/m  AORTIC VALVE AV Area (Vmax):    4.05 cm AV Area (Vmean):   3.63 cm AV Area (VTI):     3.42 cm AV Vmax:           155.00 cm/s AV Vmean:          103.000 cm/s AV VTI:            0.238 m AV Peak Grad:      9.6 mmHg AV Mean Grad:      5.0 mmHg LVOT Vmax:         151.00 cm/s LVOT Vmean:        90.100 cm/s LVOT VTI:          0.196 m LVOT/AV VTI ratio: 0.82  AORTA Ao Root diam: 3.40 cm Ao Asc diam:  3.60 cm MITRAL VALVE               TRICUSPID VALVE MV Area (PHT): 4.15 cm    TR Peak grad:   15.7 mmHg MV Decel Time: 183 msec    TR Vmax:        198.00 cm/s MV E velocity: 67.90 cm/s MV A velocity: 61.10 cm/s  SHUNTS MV E/A ratio:  1.11        Systemic VTI:  0.20 m                            Systemic Diam: 2.30 cm Skeet Latch MD Electronically signed by Skeet Latch MD Signature Date/Time: 10/27/2020/4:25:51 PM    Final      Assessment and Plan:   Abnormal echocardiogram Elevated Troponin  - currently admitted for sepsis with AKI 2/2  E coli bacteremia - patient denied chest pain - Hs Trop 1741 >2358  - Echo showed EF 50-55% (dropped from 65-70% on 12/21) with inferoseptal hypokinesis  - non-cardiac CTA from 12/25/19 concerning for aortic arthrosclerosis, subsequent CTA 07/18/20 showed no aortic atherosclerotic calcification  - suspect trop elevation is demand ischemia in the setting of septic shock/AKI, ischemia can not be ruled out, will repeat outpatient Echo and consider ischemic workup if EF remains reduced   Chronic diastolic heart failure  - Echo from 12/21  for grade I diastolic dysfunction; repeat 4/28 with EF 50-55% with inferoseptal hypokinesis  - on Lasix 40mg  daily at home for leg edema , currently held due to hypotension  - clinically euvolemic at this time   Irregular heart beat sensation - historically reporting this outpatient, without chest pain, but some intermittent dizziness - EKG and telemetry with pairs of PVCs and first degree AV block here  - follow up outpatient  Septic shock E coli bacteremia Complicated chronic catheter associated UTI AKI on CKD stage IIIa - s/p vasopressor support, on IV CTX, renal index improving, managed per ICU - consider imaging if not clinically improving    HTN - home meds include amlodipine and lasix, hold for septic shock   Asthma  - no clinical evidence of exacerbation, managed per ICU   Anemia  - Hgb dropped from 11.7  to 8.8 since admission - no active hematuria at this time - possible hemodilution   Chronic RLE  DVT since 11/2018 - on Eliquis , was interrupted   OSA - recommend continue CPAP at night     Risk Assessment/Risk Scores:      New York Heart Association (NYHA) Functional Class NYHA Class II    For questions or updates, please contact [facility] Please consult [email or web address] for contact info under    Signed, Margie Billet, NP  10/28/2020 12:05 PM   Personally seen and examined. Agree with above.   75 year old admitted with septic shock, AKI with elevated troponin of 2300 with ejection fraction reduction to 50 to 55% dropped from 65 to 70% in December with inferoseptal wall hypokinesis.  Not feeling well currently.  No chest pain no shortness of breath  On exam alert and oriented, pleasant, regular rate and rhythm with occasional ectopy, no murmurs lungs are clear abdomen soft  Assessment and plan:  Elevated troponin Low normal ejection fraction, changed echocardiogram from prior with inferoseptal hypokinesis - Fairly elevated at 2300 compatible with  demand ischemia in the setting of septic shock secondary to E. coli bacteremia.  Could potentially have underlying coronary artery disease however at this time given lack of anginal symptoms, would continue to treat conservatively.  Unable to initiate beta-blocker or ACE inhibitor at this time given AKI and hypotension. -As outpatient, may be helpful for [place] to pursue nuclear stress test to make sure there is no high risk evidence of ischemia present. - Reduced ejection fraction also likely result of septic shock.  In approximately 3 months as outpatient, could repeat echocardiogram.  Nonetheless, low normal.  Not severely reduced.  Once again outpatient pharmacologic stress testing once healed may be helpful to exclude high risk ischemia.  Chronic diastolic heart failure - Lasix and other antihypertensives currently on hold secondary to septic shock.  Clinically appears euvolemic.  No significant hypoxia.  Irregular heartbeat, palpitations - PVCs/PACs noted.  Previously documented on event monitor.  AKI - Improved renal function.  Creatinine down from 2.17-1.53  In summary, continue current course, treatment of E. coli sepsis.  No specific cardiac interventions at this time.  Please let us know if we can be of further assistance.  Candee Furbish, MD

## 2020-10-29 DIAGNOSIS — Z86010 Personal history of colonic polyps: Secondary | ICD-10-CM

## 2020-10-29 DIAGNOSIS — Z7901 Long term (current) use of anticoagulants: Secondary | ICD-10-CM

## 2020-10-29 DIAGNOSIS — D500 Iron deficiency anemia secondary to blood loss (chronic): Secondary | ICD-10-CM

## 2020-10-29 DIAGNOSIS — R195 Other fecal abnormalities: Secondary | ICD-10-CM

## 2020-10-29 LAB — CBC
HCT: 22 % — ABNORMAL LOW (ref 39.0–52.0)
Hemoglobin: 7.2 g/dL — ABNORMAL LOW (ref 13.0–17.0)
MCH: 18.9 pg — ABNORMAL LOW (ref 26.0–34.0)
MCHC: 32.7 g/dL (ref 30.0–36.0)
MCV: 57.9 fL — ABNORMAL LOW (ref 80.0–100.0)
Platelets: 129 10*3/uL — ABNORMAL LOW (ref 150–400)
RBC: 3.8 MIL/uL — ABNORMAL LOW (ref 4.22–5.81)
RDW: 16.1 % — ABNORMAL HIGH (ref 11.5–15.5)
WBC: 21.7 10*3/uL — ABNORMAL HIGH (ref 4.0–10.5)
nRBC: 0.1 % (ref 0.0–0.2)

## 2020-10-29 LAB — TYPE AND SCREEN
ABO/RH(D): O POS
Antibody Screen: NEGATIVE
Unit division: 0
Unit division: 0

## 2020-10-29 LAB — IRON AND TIBC
Iron: 35 ug/dL — ABNORMAL LOW (ref 45–182)
Saturation Ratios: 22 % (ref 17.9–39.5)
TIBC: 157 ug/dL — ABNORMAL LOW (ref 250–450)
UIBC: 122 ug/dL

## 2020-10-29 LAB — BPAM RBC
Blood Product Expiration Date: 202205302359
Blood Product Expiration Date: 202205302359
Unit Type and Rh: 5100
Unit Type and Rh: 5100

## 2020-10-29 LAB — BASIC METABOLIC PANEL
Anion gap: 11 (ref 5–15)
BUN: 18 mg/dL (ref 8–23)
CO2: 21 mmol/L — ABNORMAL LOW (ref 22–32)
Calcium: 7.6 mg/dL — ABNORMAL LOW (ref 8.9–10.3)
Chloride: 103 mmol/L (ref 98–111)
Creatinine, Ser: 1.17 mg/dL (ref 0.61–1.24)
GFR, Estimated: >60 mL/min (ref >60)
Glucose, Bld: 79 mg/dL (ref 70–99)
Potassium: 3.5 mmol/L (ref 3.5–5.1)
Sodium: 135 mmol/L (ref 135–145)

## 2020-10-29 LAB — TSH: TSH: 4.748 u[IU]/mL — ABNORMAL HIGH (ref 0.350–4.500)

## 2020-10-29 LAB — CORTISOL: Cortisol, Plasma: 15 ug/dL

## 2020-10-29 LAB — HEMOGLOBIN AND HEMATOCRIT, BLOOD
HCT: 28.3 % — ABNORMAL LOW (ref 39.0–52.0)
Hemoglobin: 9.3 g/dL — ABNORMAL LOW (ref 13.0–17.0)

## 2020-10-29 LAB — URINE CULTURE: Culture: >=100000 — AB

## 2020-10-29 LAB — CULTURE, BLOOD (ROUTINE X 2)

## 2020-10-29 LAB — FERRITIN: Ferritin: 648 ng/mL — ABNORMAL HIGH (ref 24–336)

## 2020-10-29 LAB — PROCALCITONIN: Procalcitonin: 66.81 ng/mL

## 2020-10-29 LAB — C-REACTIVE PROTEIN: CRP: 20.5 mg/dL — ABNORMAL HIGH (ref <1.0)

## 2020-10-29 LAB — MAGNESIUM: Magnesium: 1.8 mg/dL (ref 1.7–2.4)

## 2020-10-29 LAB — FOLATE: Folate: 7.6 ng/mL (ref >5.9)

## 2020-10-29 LAB — RETICULOCYTES
Immature Retic Fract: 11.4 % (ref 2.3–15.9)
RBC.: 3.9 MIL/uL — ABNORMAL LOW (ref 4.22–5.81)
Retic Count, Absolute: 43.7 10*3/uL (ref 19.0–186.0)
Retic Ct Pct: 1.1 % (ref 0.4–3.1)

## 2020-10-29 LAB — VITAMIN B12: Vitamin B-12: 672 pg/mL (ref 180–914)

## 2020-10-29 LAB — D-DIMER, QUANTITATIVE: D-Dimer, Quant: 3.29 ug/mL-FEU — ABNORMAL HIGH (ref 0.00–0.50)

## 2020-10-29 LAB — BRAIN NATRIURETIC PEPTIDE: B Natriuretic Peptide: 681.7 pg/mL — ABNORMAL HIGH (ref 0.0–100.0)

## 2020-10-29 MED ORDER — DIPHENHYDRAMINE HCL 50 MG/ML IJ SOLN: 25.0000 mg | Freq: Four times a day (QID) | INTRAMUSCULAR | Status: DC | PRN

## 2020-10-29 MED ORDER — ROSUVASTATIN CALCIUM 5 MG PO TABS
10.0000 mg | ORAL_TABLET | Freq: Every day | ORAL | Status: DC
  Administered 2020-10-29 – 2020-11-02 (×5): 10 mg via ORAL
  Filled 2020-10-29 (×5): qty 2

## 2020-10-29 MED ORDER — MIDODRINE HCL 5 MG PO TABS
10.0000 mg | ORAL_TABLET | Freq: Three times a day (TID) | ORAL | Status: DC
  Administered 2020-10-29 – 2020-10-30 (×6): 10 mg via ORAL
  Filled 2020-10-29 (×6): qty 2

## 2020-10-29 MED ORDER — FERROUS SULFATE 325 (65 FE) MG PO TABS
325.0000 mg | ORAL_TABLET | Freq: Three times a day (TID) | ORAL | Status: DC
  Administered 2020-10-29 – 2020-11-02 (×9): 325 mg via ORAL
  Filled 2020-10-29 (×10): qty 1

## 2020-10-29 MED ORDER — SODIUM CHLORIDE 0.9% IV SOLUTION: Freq: Once | INTRAVENOUS | Status: AC

## 2020-10-29 MED ORDER — ACETAMINOPHEN 325 MG PO TABS
650.0000 mg | ORAL_TABLET | Freq: Four times a day (QID) | ORAL | Status: DC | PRN
  Administered 2020-10-29 – 2020-11-02 (×6): 650 mg via ORAL
  Filled 2020-10-29 (×6): qty 2

## 2020-10-29 MED ORDER — PANTOPRAZOLE SODIUM 40 MG IV SOLR
40.0000 mg | Freq: Two times a day (BID) | INTRAVENOUS | Status: DC
Start: 2020-10-29 — End: 2020-11-01
  Administered 2020-10-29 – 2020-11-01 (×6): 40 mg via INTRAVENOUS
  Filled 2020-10-29 (×6): qty 40

## 2020-10-29 MED ORDER — LACTATED RINGERS IV SOLN: INTRAVENOUS | Status: DC

## 2020-10-29 MED ORDER — FUROSEMIDE 10 MG/ML IJ SOLN
20.0000 mg | Freq: Once | INTRAMUSCULAR | Status: AC
  Administered 2020-10-29: 20 mg via INTRAVENOUS
  Filled 2020-10-29: qty 2

## 2020-10-29 NOTE — Progress Notes (Signed)
Pt transferred from 43m. Pt on room air. Vitals taken. Bath completed. Pt alert and oriented x4. Skin check completed. Pt belongings at bedside: watch in pink denture cup. Call bell within reach.

## 2020-10-29 NOTE — Progress Notes (Signed)
[facility] Progress Note Patient Name: NARAYAN SCULL DOB: 1946-01-11 MRN: [id number]   Date of Service  10/29/2020  HPI/Events of Note  HA and back pain - Patient requests Tylenol PO. AST and ALT both normal.   eICU Interventions  Plan: 1. D/C Tylenol suppository. 2. Tylenol 650 mg PO Q 6 hours PRN pain, HA or Temp > 101.0 F.      Intervention Category Major Interventions: Other:  Lysle Dingwall 10/29/2020, 12:20 AM

## 2020-10-29 NOTE — Consult Note (Addendum)
Consultation  Referring Provider:    Dr. Candiss Norse Primary Care Physician:  Pcp, No Primary Gastroenterologist:   Dr. Havery Moros      Reason for Consultation: Hemoccult positive stool and anemia            HPI:   Shane [name] is a 75 y.o. 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  He was diagnosed with sepsis due to E. coli UTI and initially stabilized in the ICU then transferred to the hospital service on 10/29/2020.  We are consulted in regards to the patient's microcytic anemia in the presence of ongoing Eliquis use and Hemoccult positive stool on 10/26/2020.  Apparently had a significant drop in hemoglobin over the past 24 hours (9.7 -->8.8 --> 7.2 )and is receiving 2 units of PRBCs.    Today, the patient explains that he has not noticed any change in his bowel movements.  The last time he had a stool was on 10/27/2020.  Denies seeing any blood.  In fact does not have many complaints today when I see him, the only real comment he makes is "y'all sure are doing a whole lot to me while I am here".    Denies fever, chills, weight loss, change in bowel habits, nausea, vomiting, heartburn or reflux.     GI history: Office visit 04/17/2018 Dr. Havery Moros discussed reflux and dysphagia; at that time increase Nexium to 40 twice a day for a few weeks, it was recommended he have an EGD, but never had this done Colonoscopy 04/02/2017: Normal ileum, 5 small polyps, internal hemorrhoids-Path consistent with adenoma/sessile serrated polyps-repeat recommended 10/21 Colonoscopy 09/23/2012 with a sigmoid adenoma x1, otherwise normal EGD 09/23/2012 with small hiatal hernia and otherwise  normal  Past Medical History:  Diagnosis Date  . Acute asthmatic bronchitis   . Anemia   . Anxiety disorder   . Borderline diabetes mellitus   . Borderline hypertension   . Chronic back pain   . Degenerative joint disease   . Depression   . GERD (gastroesophageal reflux disease)   . History of adenomatous polyp of colon   . History of sinusitis   . Posttraumatic stress disorder   . Sleep apnea    Uses CPAP    Past Surgical History:  Procedure Laterality Date  . COLONOSCOPY    . FINGER SURGERY Right    index  . NASAL SINUS SURGERY     x 2  . POLYPECTOMY      Family History  Problem Relation Age of Onset  . Diabetes Father   . Arthritis Mother        Unknown cause of death  . Colon cancer Neg Hx   . Colon polyps Neg Hx   . Rectal cancer Neg Hx   . Stomach cancer Neg Hx   . Allergic rhinitis Neg Hx   . Angioedema Neg Hx   . Asthma Neg Hx   . Eczema Neg Hx   . Immunodeficiency Neg Hx   . Urticaria Neg Hx      Social History   Tobacco Use  . Smoking status: Former Smoker    Packs/day: 0.50    Years: 25.00  Pack years: 12.50    Types: Cigarettes    Quit date: 07/02/1983    Years since quitting: 37.3  . Smokeless tobacco: Never Used  Vaping Use  . Vaping Use: Never used  Substance Use Topics  . Alcohol use: No  . Drug use: No    Prior to Admission medications   Medication Sig Start Date End Date Taking? Authorizing Provider  albuterol (VENTOLIN HFA) 108 (90 Base) MCG/ACT inhaler INHALE TWO PUFFS EVERY FOUR TO SIX HOURS AS NEEDED FOR COUGH OR WHEEZE. Patient taking differently: Inhale 2 puffs into the lungs every 4 (four) hours as needed for wheezing (cough). 04/03/20  Yes Kozlow, Alvira Philips, MD  alum & mag hydroxide-[facility] MAXIMUM STRENGTH) 400-400-40 MG/5ML suspension Take 5 mLs by mouth every 6 (six) hours as needed (abdominal pain or indigestion). 06/10/19  Yes Gerhard Munch, MD  amLODipine (NORVASC) 10 MG tablet Take 1 tablet (10 mg total) by  mouth daily. 12/28/19  Yes Standley Brooking, MD  apixaban (ELIQUIS) 5 MG TABS tablet Take 1 tablet (5 mg total) by mouth 2 (two) times daily. 09/09/20 10/09/20 Yes Jerald Kief, MD  chlorhexidine (PERIDEX) 0.12 % solution 5 mLs by Mouth Rinse route 2 (two) times daily. 10/24/20  Yes [provider]  fluticasone-salmeterol (ADVAIR) 500-50 MCG/ACT AEPB Inhale 1 puff into the lungs in the morning and at bedtime.   Yes [provider]  furosemide (LASIX) 40 MG tablet TAKE 1 TABLET BY MOUTH EVERY DAY Patient taking differently: Take 40 mg by mouth daily. 08/07/20  Yes Rollene Rotunda, MD  HYDROcodone-acetaminophen (NORCO) 5-325 MG tablet Take 1-2 tablets by mouth every 6 (six) hours as needed for moderate pain. 09/09/20  Yes Jerald Kief, MD  montelukast (SINGULAIR) 10 MG tablet TAKE 1 TABLET BY MOUTH EVERYDAY AT BEDTIME Patient taking differently: Take 10 mg by mouth at bedtime. 09/09/19  Yes Kozlow, Alvira Philips, MD  nitrofurantoin, macrocrystal-monohydrate, (MACROBID) 100 MG capsule Take 100 mg by mouth 2 (two) times daily. 10/25/20  Yes [provider]  pantoprazole (PROTONIX) 40 MG tablet Take 1 tablet (40 mg total) by mouth daily. 09/10/20 10/10/20 Yes Jerald Kief, MD  phenazopyridine (PYRIDIUM) 100 MG tablet Take 100 mg by mouth 3 (three) times daily with meals. For pain   Yes [provider]  Propylene Glycol (SYSTANE BALANCE OP) Place 1 drop into both eyes daily as needed (for dry eyes).   Yes [provider]  tamsulosin (FLOMAX) 0.4 MG CAPS capsule Take 0.4 mg by mouth daily. 10/25/20  Yes [provider]  triamcinolone (NASACORT ALLERGY 24HR) 55 MCG/ACT AERO nasal inhaler Use one spray in each nostril once daily as directed. Patient taking differently: Place 1 spray into the nose daily. 01/28/17  Yes Kozlow, Alvira Philips, MD  BREO ELLIPTA 200-25 MCG/INH AEPB INHALE 1 PUFF BY MOUTH EVERY DAY Patient not taking: Reported on 10/27/2020 03/16/20   Jessica Priest, MD    Current Facility-Administered Medications  Medication Dose Route Frequency Provider Last Rate Last Admin  . 0.9 %  sodium chloride infusion (Manually program via Guardrails IV Fluids)   Intravenous Once Leroy Sea, MD      . 0.9 %  sodium chloride infusion  250 mL Intravenous Continuous Kirtland Bouchard, MD 10 mL/hr at 10/27/20 2038 Infusion Verify at 10/27/20 2038  . acetaminophen (TYLENOL) tablet 650 mg  650 mg Oral Q6H PRN Karl Ito, MD   650 mg at 10/29/20 0039  .  cefTRIAXone (ROCEPHIN) 2 g in sodium chloride 0.9 % 100 mL IVPB  2 g Intravenous Q24H Laurin Coder, MD   Stopped at 10/28/20 2118  . Chlorhexidine Gluconate Cloth 2 % PADS 6 each  6 each Topical Q0600 Deland Pretty, MD   6 each at 10/28/20 2225  . diphenhydrAMINE (BENADRYL) injection 25 mg  25 mg Intravenous Q6H PRN Thurnell Lose, MD      . docusate sodium (COLACE) capsule 100 mg  100 mg Oral BID PRN Deland Pretty, MD      . ferrous sulfate tablet 325 mg  325 mg Oral TID WC Thurnell Lose, MD      . furosemide (LASIX) injection 20 mg  20 mg Intravenous Once Lala Lund K, MD      . lactated ringers bolus 1,000 mL  1,000 mL Intravenous Once Blanchie Dessert, MD       And  . lactated ringers bolus 1,000 mL  1,000 mL Intravenous Once Blanchie Dessert, MD      . midodrine (PROAMATINE) tablet 10 mg  10 mg Oral TID WC Thurnell Lose, MD   10 mg at 10/29/20 0802  . norepinephrine (LEVOPHED) 4mg  in 28mL premix infusion  2-10 mcg/min Intravenous Titrated Sherrilyn Rist A, MD   Stopped at 10/28/20 0533  . ondansetron (ZOFRAN) injection 4 mg  4 mg Intravenous Q6H PRN Deland Pretty, MD      . pantoprazole (PROTONIX) injection 40 mg  40 mg Intravenous Q12H Lala Lund K, MD      . polyethylene glycol (MIRALAX / GLYCOLAX) packet 17 g  17 g Oral Daily PRN Deland Pretty, MD      . rosuvastatin (CRESTOR) tablet 10 mg  10 mg Oral Daily Thurnell Lose, MD        Allergies as of 10/26/2020  . (No Known Allergies)     Review of Systems:    Constitutional: No weight loss, fever or chills Skin: No rash  Cardiovascular: No chest pain Respiratory: No SOB Gastrointestinal: See HPI and otherwise negative Genitourinary: No dysuria  Neurological: No headache, dizziness or syncope Musculoskeletal: No new muscle or joint pain Hematologic: No bleeding  Psychiatric: No history of depression or anxiety    Physical Exam:  Vital signs in last 24 hours: Temp:  [98.2 F (36.8 C)-99.68 F (37.6 C)] 98.2 F (36.8 C) (05/01 0355) Pulse Rate:  [64-85] 67 (05/01 0800) Resp:  [13-27] 13 (05/01 0800) BP: (90-108)/(54-69) 96/57 (05/01 0800) SpO2:  [95 %-100 %] 95 % (05/01 0800) Weight:  [124.6 kg] 124.6 kg (05/01 0525) Last BM Date: 10/27/20 General:   Pleasant AA male appears to be in NAD, Well developed, Well nourished, alert and cooperative Head:  Normocephalic and atraumatic. Eyes:   PEERL, EOMI. No icterus. Conjunctiva pink. Ears:  Normal auditory acuity. Neck:  Supple Throat: Oral cavity and pharynx without inflammation, swelling or lesion.  Lungs: Respirations even and unlabored. Lungs clear to auscultation bilaterally.   No wheezes, crackles, or rhonchi.  Heart: Normal S1, S2. No MRG. Regular rate and rhythm. No peripheral edema, cyanosis or pallor.  Abdomen:  Soft, nondistended, nontender. No rebound or guarding. Normal bowel sounds. No appreciable masses or hepatomegaly. Rectal:  Not performed.  Urinary: +foley catheter with blood tinged urine Msk:  Symmetrical without gross deformities. Peripheral pulses intact.  Extremities:  Without edema, no deformity or joint abnormality.  Neurologic:  Alert and  oriented x4;  grossly normal neurologically.  Skin:  Dry and intact without significant lesions or rashes. Psychiatric: Demonstrates good judgement and reason without abnormal affect or behaviors.   LAB RESULTS: Recent Labs     10/27/20 0118 10/28/20 0042 10/29/20 0031  WBC 15.0* 26.1* 21.7*  HGB 9.7* 8.8* 7.2*  HCT 31.2* 26.7* 22.0*  PLT 208 169 129*   BMET Recent Labs    10/27/20 1255 10/28/20 0042 10/29/20 0031  NA 134* 136 135  K 3.7 4.0 3.5  CL 101 101 103  CO2 18* 20* 21*  GLUCOSE 167* 139* 79  BUN 18 18 18   CREATININE 1.87* 1.53* 1.17  CALCIUM 7.7* 7.5* 7.6*   LFT Recent Labs    10/26/20 1807  PROT 7.0  ALBUMIN 2.9*  AST 23  ALT 10  ALKPHOS 147*  BILITOT 1.7*   PT/INR Recent Labs    10/26/20 1807  LABPROT 23.3*  INR 2.1*    STUDIES: ECHOCARDIOGRAM COMPLETE  Result Date: 10/27/2020    ECHOCARDIOGRAM REPORT   Patient Name:   Shane [name] Date of Exam: 10/27/2020 Medical Rec #:  [id number]          Height:       74.0 in Accession #:    [id number]         Weight:       250.0 lb Date of Birth:  [birth year]          BSA:          2.390 m Patient Age:    89 years           BP:           97/58 mmHg Patient Gender: M                  HR:           81 bpm. Exam Location:  Inpatient Procedure: 2D Echo, Cardiac Doppler and Color Doppler Indications:    CHF  History:        Patient has prior history of Echocardiogram examinations, most                 recent 06/19/2020. Risk Factors:Hypertension and Diabetes.  Sonographer:    Cammy Brochure Referring Phys: [id number] ADEWALE A Ander Slade  Sonographer Comments: Image acquisition challenging due to patient body habitus and Image acquisition challenging due to respiratory motion. IMPRESSIONS  1. Inferior and inferoseptal hypokinesis. Left ventricular ejection fraction, by estimation, is 50 to 55%. The left ventricle has low normal function. The left ventricle demonstrates regional wall motion abnormalities (see scoring diagram/findings for description). There is moderate concentric left ventricular hypertrophy. Left ventricular diastolic parameters are consistent with Grade I diastolic dysfunction (impaired relaxation).  2. Right ventricular systolic  function is normal. The right ventricular size is normal.  3. The mitral valve is normal in structure. No evidence of mitral valve regurgitation. No evidence of mitral stenosis.  4. The aortic valve is tricuspid. Aortic valve regurgitation is not visualized. No aortic stenosis is present.  5. The inferior vena cava is normal in size with greater than 50% respiratory variability, suggesting right atrial pressure of 3 mmHg. FINDINGS  Left Ventricle: Inferior and inferoseptal hypokinesis. Left ventricular ejection fraction, by estimation, is 50 to 55%. The left ventricle has low normal function. The left ventricle demonstrates regional wall motion abnormalities. The left ventricular internal cavity size was normal in size. There is moderate concentric left ventricular hypertrophy. Left ventricular diastolic parameters are consistent with Grade I diastolic dysfunction (impaired  relaxation). Indeterminate filling pressures. Right Ventricle: The right ventricular size is normal. No increase in right ventricular wall thickness. Right ventricular systolic function is normal. Left Atrium: Left atrial size was normal in size. Right Atrium: Right atrial size was normal in size. Pericardium: There is no evidence of pericardial effusion. Mitral Valve: The mitral valve is normal in structure. No evidence of mitral valve regurgitation. No evidence of mitral valve stenosis. Tricuspid Valve: The tricuspid valve is normal in structure. Tricuspid valve regurgitation is mild . No evidence of tricuspid stenosis. Aortic Valve: The aortic valve is tricuspid. Aortic valve regurgitation is not visualized. No aortic stenosis is present. Aortic valve mean gradient measures 5.0 mmHg. Aortic valve peak gradient measures 9.6 mmHg. Aortic valve area, by VTI measures 3.42 cm. Pulmonic Valve: The pulmonic valve was normal in structure. Pulmonic valve regurgitation is trivial. No evidence of pulmonic stenosis. Aorta: The aortic root is normal in  size and structure. Venous: The inferior vena cava is normal in size with greater than 50% respiratory variability, suggesting right atrial pressure of 3 mmHg. IAS/Shunts: No atrial level shunt detected by color flow Doppler.  LEFT VENTRICLE PLAX 2D LVIDd:         4.20 cm  Diastology LVIDs:         2.80 cm  LV e' medial:    6.74 cm/s LV PW:         1.40 cm  LV E/e' medial:  10.1 LV IVS:        1.60 cm  LV e' lateral:   10.60 cm/s LVOT diam:     2.30 cm  LV E/e' lateral: 6.4 LV SV:         81 LV SV Index:   34 LVOT Area:     4.15 cm  RIGHT VENTRICLE RV S prime:     11.10 cm/s TAPSE (M-mode): 2.0 cm LEFT ATRIUM             Index       RIGHT ATRIUM           Index LA diam:        3.80 cm 1.59 cm/m  RA Area:     19.00 cm LA Vol (A2C):   43.6 ml 18.25 ml/m RA Volume:   50.50 ml  21.13 ml/m LA Vol (A4C):   32.3 ml 13.52 ml/m LA Biplane Vol: 38.1 ml 15.94 ml/m  AORTIC VALVE AV Area (Vmax):    4.05 cm AV Area (Vmean):   3.63 cm AV Area (VTI):     3.42 cm AV Vmax:           155.00 cm/s AV Vmean:          103.000 cm/s AV VTI:            0.238 m AV Peak Grad:      9.6 mmHg AV Mean Grad:      5.0 mmHg LVOT Vmax:         151.00 cm/s LVOT Vmean:        90.100 cm/s LVOT VTI:          0.196 m LVOT/AV VTI ratio: 0.82  AORTA Ao Root diam: 3.40 cm Ao Asc diam:  3.60 cm MITRAL VALVE               TRICUSPID VALVE MV Area (PHT): 4.15 cm    TR Peak grad:   15.7 mmHg MV Decel Time: 183 msec    TR Vmax:  198.00 cm/s MV E velocity: 67.90 cm/s MV A velocity: 61.10 cm/s  SHUNTS MV E/A ratio:  1.11        Systemic VTI:  0.20 m                            Systemic Diam: 2.30 cm Skeet Latch MD Electronically signed by Skeet Latch MD Signature Date/Time: 10/27/2020/4:25:51 PM    Final     Impression / Plan:   Impression: 1.  Microcytic anemia with Hemoccult positive stool: 4/29 hemoglobin 9.7--> 4/3 8.8--> 5/1 7.2, patient has seen no active GI bleeding, does continue to have some hematuria; consider GI versus urinary  source 2.  History of DVT: Currently on Eliquis, last dose this morning 10/29/2020-now on hold 3.  Metabolic encephalopathy 4.  Septic shock E. coli UTI in the presence of chronic indwelling Foley catheter: On Rocephin 5.  Chronic indwelling Foley catheter 6.  AKI caused by hypoperfusion due to septic shock: Improving after hydration 7.  Non-ACS pattern elevation of troponin: Now no chest pain  Plan: 1.  Patient would likely benefit from endoscopic procedures to rule out GI mucosal lesions.  As well due for colon polyp surveillance.  Need to await for Eliquis washout.  Likely Wednesday did discuss risks, benefits, limitations and alternatives and patient agrees to proceed.  2.  Patient can continue current diet today.  He will be n.p.o. at midnight. 3.  Please await further recommendations from Dr. Henrene Pastor later today.  Thank you for your kind consultation, we will continue to follow.  Lavone Nian Lemmon  10/29/2020, 11:07 AM  GI ATTENDING  History, laboratories, x-rays, prior endoscopy reports reviewed.  Patient seen and examined.  Agree with comprehensive consultation note as outlined above.  Medically complex patient with multiple significant medical problems.  He presents with urosepsis.  We are asked to see him for worsening anemia and Hemoccult-positive stool.  He is on chronic Eliquis therapy.  Last dose today.  He does have a history of multiple adenomatous and sessile serrated polyps.  Ideally would perform colonoscopy and upper endoscopy when Eliquis has washed out.  He is high risk.  GI will follow.  Docia Chuck. Geri Seminole., M.D. [facility] Division of Gastroenterology

## 2020-10-29 NOTE — Evaluation (Signed)
Occupational Therapy Evaluation Patient Details Name: Shane [name] MRN: [id number] DOB: Sep 19, 1945 Today's Date: 10/29/2020    History of Present Illness This is a 75 year old black male that presented from a skilled nursing facility.  Very limited history is available of recent events.  The patient was sent to the emergency room because he was less responsive than normal and that he had hematuria after having a Foley catheter replaced yesterday.Found to have septic shock due to E. coli UTI .  Acute renal insufficiency  Intravascular volume depletion  Metabolic encephalopathy,Microcytic anemia with Hemoccult positive stool   Clinical Impression   This 75 yo male admitted with above presents to acute OT with PLOF of being at SNF for 1.5 months and per brother and pt has no longer been getting out of bed at [facility] and was total A for basic ADLs. Currently he has good use of arms at bed level to do some of his basic ADLs (bathing, grooming) and is Max A +2 for all bed mobility and attempt at sit<>stand (but unable to get hips off of bed with bed raised). He has potential to be able to do more for himself and decrease burden of care so will benefit from continued acute OT with follow up recommended at SNF.    Follow Up Recommendations  SNF;Supervision/Assistance - 24 hour    Equipment Recommendations  Other (comment) (TBD at next venue)       Precautions / Restrictions Precautions Precautions: Fall Restrictions Weight Bearing Restrictions: No      Mobility Bed Mobility Overal bed mobility: Needs Assistance Bed Mobility: Supine to Sit;Sit to Supine     Supine to sit: Max assist;+2 for physical assistance;HOB elevated Sit to supine: Max assist;+2 for physical assistance   General bed mobility comments: A for legs and trunk with pt A'ing some with legs with cues for supine>sit AND he A'd with trunk and legs for sit>supine (needed more A for LLE both directions)    Transfers Overall  transfer level: Needs assistance   Transfers: Sit to/from Stand           General transfer comment: Attempted sit<>stand from raised bed x3 trials with pt getting more weight on feet/legs on last trial but unable to raise hips off of bed    Balance Overall balance assessment: Needs assistance Sitting-balance support: Bilateral upper extremity supported;Feet supported Sitting balance-Leahy Scale: Poor                                     ADL either performed or assessed with clinical judgement   ADL Overall ADL's : Needs assistance/impaired Eating/Feeding: Minimal assistance;Bed level   Grooming: Moderate assistance;Bed level   Upper Body Bathing: Moderate assistance;Bed level   Lower Body Bathing: Total assistance;Bed level   Upper Body Dressing : Total assistance;Bed level   Lower Body Dressing: Total assistance;Bed level                                    Pertinent Vitals/Pain Pain Assessment: Faces Faces Pain Scale: Hurts whole lot Pain Location: LLE with movement (from knee to foot) Pain Descriptors / Indicators: Aching;Sore Pain Intervention(s): Limited activity within patient's tolerance;Monitored during session;Repositioned     Hand Dominance Right   Extremity/Trunk Assessment Upper Extremity Assessment Upper Extremity Assessment: Generalized weakness  Communication Communication Communication: No difficulties   Cognition Arousal/Alertness: Awake/alert Behavior During Therapy: Anxious Overall Cognitive Status: Impaired/Different from baseline Area of Impairment: Safety/judgement;Problem solving                         Safety/Judgement: Decreased awareness of safety   Problem Solving: Difficulty sequencing;Requires verbal cues;Requires tactile cues                Home Living Family/patient expects to be discharged to:: Skilled nursing facility                                         Prior Functioning/Environment Level of Independence: Needs assistance  Gait / Transfers Assistance Needed: Brother reports when he first went to SNF they had him up and walking, then only sitting on EOB, and as late not doing anything with him therapy wise ADL's / Homemaking Assistance Needed: total A   Comments: Has been at [facility] for past 1 1/2 months per brother in room        OT Problem List: Decreased strength;Decreased range of motion;Impaired balance (sitting and/or standing);Decreased cognition;Pain      OT Treatment/Interventions: Self-care/ADL training;DME and/or AE instruction;Patient/family education;Balance training;Therapeutic exercise    OT Goals(Current goals can be found in the care plan section) Acute Rehab OT Goals Patient Stated Goal: To lay back down once he sat up on EOB with [place] OT Goal Formulation: With patient Time For Goal Achievement: 11/12/20 Potential to Achieve Goals: Fair  OT Frequency: Min 2X/week   Barriers to D/C: Decreased caregiver support          Co-evaluation PT/OT/SLP Co-Evaluation/Treatment: Yes Reason for Co-Treatment: Necessary to address cognition/behavior during functional activity;For patient/therapist safety PT goals addressed during session: Mobility/safety with mobility;Balance;Strengthening/ROM OT goals addressed during session: Strengthening/ROM      AM-PAC OT "6 Clicks" Daily Activity     Outcome Measure Help from another person eating meals?: A Little Help from another person taking care of personal grooming?: A Lot Help from another person toileting, which includes using toliet, bedpan, or urinal?: Total Help from another person bathing (including washing, rinsing, drying)?: A Lot Help from another person to put on and taking off regular upper body clothing?: Total Help from another person to put on and taking off regular lower body clothing?: Total 6 Click Score: 10   End of Session Equipment Utilized  During Treatment: Gait belt;Rolling walker  Activity Tolerance: Patient limited by pain Patient left: in bed;with call bell/phone within reach;with bed alarm set;with family/visitor present  OT Visit Diagnosis: Other abnormalities of gait and mobility (R26.89);Muscle weakness (generalized) (M62.81);Pain Pain - Right/Left: Left Pain - part of body: Leg                Time: 4098-1191 OT Time Calculation (min): 32 min Charges:  OT General Charges $OT Visit: 1 Visit OT Evaluation $OT Eval Moderate Complexity: Ferguson, OTR/L Acute [employer] Pager [phone] Office [phone]     Almon Register 10/29/2020, 4:53 PM

## 2020-10-29 NOTE — Evaluation (Signed)
Physical Therapy Evaluation Patient Details Name: Shane [name] MRN: [id number] DOB: [birth year] Today's Date: 10/29/2020   History of Present Illness  This is a 75 year old black male that presented from a skilled nursing facility.  Very limited history is available of recent events.  The patient was sent to the emergency room because he was less responsive than normal and that he had hematuria after having a Foley catheter replaced yesterday.Found to have septic shock due to E. coli UTI .  Acute renal insufficiency  Intravascular volume depletion  Metabolic encephalopathy,Microcytic anemia with Hemoccult positive stool  Clinical Impression  Patient presents with decreased mobility due to weakness, pain, decreased activity tolerance, decreased balance and he will benefit from skilled PT in the acute setting and from follow up STSNF level rehab at d/c.  Brother states he did not participate much at SNF recently, but he hopes to assist with motivating him by attending therapy sessions when he can.      Follow Up Recommendations SNF (but brother states would like different than current faciltiy)    Equipment Recommendations  None recommended by PT    Recommendations for Other Services       Precautions / Restrictions Precautions Precautions: Fall Restrictions Weight Bearing Restrictions: No      Mobility  Bed Mobility Overal bed mobility: Needs Assistance Bed Mobility: Supine to Sit;Sit to Supine     Supine to sit: Max assist;+2 for physical assistance;HOB elevated Sit to supine: Max assist;+2 for physical assistance   General bed mobility comments: A for legs and trunk with pt A'ing some with legs with cues for supine>sit AND he A'd with trunk and legs for sit>supine (needed more A for LLE both directions)    Transfers Overall transfer level: Needs assistance   Transfers: Sit to/from Stand Sit to Stand: +2 physical assistance;Max assist         General transfer  comment: Attempted sit<>stand from raised bed x3 trials with pt getting more weight on feet/legs on last trial but unable to raise hips off of bed  Ambulation/Gait                Stairs            Wheelchair Mobility    Modified Rankin (Stroke Patients Only)       Balance Overall balance assessment: Needs assistance Sitting-balance support: Bilateral upper extremity supported;Feet supported Sitting balance-Leahy Scale: Poor       Standing balance-Leahy Scale: Zero Standing balance comment: unable to stand fully with +2 A                             Pertinent Vitals/Pain Pain Assessment: Faces Faces Pain Scale: Hurts whole lot Pain Location: LLE with movement (from knee to foot) Pain Descriptors / Indicators: Aching;Sore Pain Intervention(s): Limited activity within patient's tolerance;Monitored during session;Repositioned    Home Living Family/patient expects to be discharged to:: Skilled nursing facility                      Prior Function Level of Independence: Needs assistance   Gait / Transfers Assistance Needed: Brother reports when he first went to SNF they had him up and walking, then only sitting on EOB, and as late not doing anything with him therapy wise  ADL's / Homemaking Assistance Needed: total A  Comments: Has been at [facility] for past 1 1/2 months per brother in room  Hand Dominance   Dominant Hand: Right    Extremity/Trunk Assessment   Upper Extremity Assessment Upper Extremity Assessment: Defer to OT evaluation    Lower Extremity Assessment Lower Extremity Assessment: LLE deficits/detail;RLE deficits/detail RLE Deficits / Details: AAROM WFL, strength hip flexion 2+/5, knee extension 3+/5, ankle DF 4-/5 RLE Sensation: decreased light touch;history of peripheral neuropathy LLE Deficits / Details: AAROM limited by pain, strength hip flexion 1+/5, knee extension 2/5 LLE: Unable to fully assess due to  pain LLE Sensation: decreased light touch;history of peripheral neuropathy       Communication   Communication: No difficulties  Cognition Arousal/Alertness: Awake/alert Behavior During Therapy: Anxious Overall Cognitive Status: Impaired/Different from baseline Area of Impairment: Safety/judgement;Problem solving                         Safety/Judgement: Decreased awareness of safety   Problem Solving: Difficulty sequencing;Requires verbal cues;Requires tactile cues        General Comments General comments (skin integrity, edema, etc.): skin changes on toes and feet R worse than L, toe nails long, thick and tortuous    Exercises     Assessment/Plan    PT Assessment Patient needs continued PT services  PT Problem List Decreased strength;Decreased activity tolerance;Decreased mobility;Decreased safety awareness;Pain;Decreased knowledge of precautions;Decreased knowledge of use of DME;Decreased balance;Decreased range of motion       PT Treatment Interventions DME instruction;Balance training;Therapeutic activities;Gait training;Functional mobility training;Therapeutic exercise;Patient/family education;Wheelchair mobility training    PT Goals (Current goals can be found in the Care Plan section)  Acute Rehab PT Goals Patient Stated Goal: To lay back down once he sat up on EOB with [place] PT Goal Formulation: With patient/family Time For Goal Achievement: 11/12/20 Potential to Achieve Goals: Fair    Frequency Min 2X/week   Barriers to discharge        Co-evaluation PT/OT/SLP Co-Evaluation/Treatment: Yes Reason for Co-Treatment: Necessary to address cognition/behavior during functional activity;For patient/therapist safety;To address functional/ADL transfers PT goals addressed during session: Mobility/safety with mobility;Balance OT goals addressed during session: Strengthening/ROM       AM-PAC PT "6 Clicks" Mobility  Outcome Measure Help needed turning from  your back to your side while in a flat bed without using bedrails?: A Lot Help needed moving from lying on your back to sitting on the side of a flat bed without using bedrails?: Total Help needed moving to and from a bed to a chair (including a wheelchair)?: Total Help needed standing up from a chair using your arms (e.g., wheelchair or bedside chair)?: Total Help needed to walk in hospital room?: Total Help needed climbing 3-5 steps with a railing? : Total 6 Click Score: 7    End of Session Equipment Utilized During Treatment: Gait belt Activity Tolerance: Patient limited by fatigue;Patient limited by pain Patient left: in bed;with call bell/phone within reach;with family/visitor present;with bed alarm set   PT Visit Diagnosis: Other abnormalities of gait and mobility (R26.89);Muscle weakness (generalized) (M62.81);Difficulty in walking, not elsewhere classified (R26.2)    Time: 6967-8938 PT Time Calculation (min) (ACUTE ONLY): 32 min   Charges:   PT Evaluation $PT Eval Moderate Complexity: 1 Mod          Cyndi Gloriann Riede, PT Acute Rehabilitation Services BOFBP:[phone] Office:[phone] 10/29/2020    Reginia Naas 10/29/2020, 7:28 PM

## 2020-10-29 NOTE — Progress Notes (Signed)
Pt does not wear cpap at home and does not wish to wear one here.  RT available if pt changes his mind.

## 2020-10-29 NOTE — Progress Notes (Addendum)
PROGRESS NOTE                                                                                                                                                                                                             Patient Demographics:    Shane [name], is a 75 y.o. male, DOB - 12-22-1945, US:[id number]  Outpatient Primary MD for the patient is Pcp, No    LOS - 3  Admit date - 10/26/2020    Chief Complaint  Patient presents with  . Hematuria       Brief Narrative (HPI from H&P) - This is a 75 year old black male that presented from a skilled nursing facility.  Very limited history is available of recent events.  The patient was sent to the emergency room because he was less responsive than normal and that he had hematuria after having a Foley catheter replaced on 10/25/2020, patient was diagnosed with sepsis due to E. coli UTI, initially stabilized in ICU then transferred to hospitalist service on 10/29/2017.   Subjective:    Shane [name] today has, No headache, No chest pain, No abdominal pain - No Nausea, No new weakness tingling or numbness, no SOB.   Assessment  & Plan :     1.  Septic shock E. coli UTI in the presence of chronic indwelling Foley catheter which was switched at SNF on 10/25/2020.  Sepsis pathophysiology has also been, still requires some IV fluid and will receive some IV fluids on 10/30/2019, continue Rocephin, procalcitonin trending down appears nontoxic.  2.  AKI caused by hypoperfusion due to septic shock.  Improving after hydration.  3.  Chronic indwelling Foley catheter.  Replaced on 10/25/2020 at SNF.  Outpatient follow-up with urology.  4.  Non-ACS pattern elevation of troponin.  Likely due to demand mismatch caused by hypotension in the presence of septic shock, EF has dropped a little bit as compared to previous echo, he has been evaluated by cardiology who recommends outpatient cardiology  follow-up and a repeat echocardiogram likely in the next few weeks.  He is chest pain-free already on Eliquis, currently blood pressure too low for beta-blockers, will place him on some statin for secondary prevention.   5.  Microcytic anemia in the presence of ongoing Eliquis use.  IV PPI, check anemia panel. Patient was Hemoccult positive on 10/27/2019, since  he has significant H&H drop will request GI to evaluate, for now hold anticoagulation for 24 hours, will transfuse 2 units on 10/29/2020 and monitor..  6. 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 Check Leg [place].  7.  Metabolic encephalopathy.  Due to sepsis, improving.  No focal deficits      Condition - Extremely Guarded  Family Communication  : None Present  Code Status :  DNR  Consults  :  PCCM, Cards, GI  PUD Prophylaxis : PPI   Procedures  :       Leg [place] -   TTE - 1. Inferior and inferoseptal hypokinesis. Left ventricular ejection fraction, by estimation, is 50 to 55%. The left ventricle has low normal function. The left ventricle demonstrates regional wall motion abnormalities (see scoring diagram/findings for description). There is moderate concentric left ventricular hypertrophy. Left ventricular diastolic parameters are consistent with Grade I diastolic dysfunction (impaired relaxation).  2. Right ventricular systolic function is normal. The right ventricular size is normal.  3. The mitral valve is normal in structure. No evidence of mitral valve regurgitation. No evidence of mitral stenosis.  4. The aortic valve is tricuspid. Aortic valve regurgitation is not visualized. No aortic stenosis is present.  5. The inferior vena cava is normal in size with greater than 50% respiratory variability, suggesting right atrial pressure of 3 mmHg.       Disposition Plan  :    Status is: Inpatient  Remains inpatient appropriate because:IV treatments appropriate due  to intensity of illness or inability to take PO   Dispo: The patient is from: SNF              Anticipated d/c is to: SNF              Patient currently is not medically stable to d/c.   Difficult to place patient No  DVT Prophylaxis  :    SCDs Start: 10/26/20 2320     Lab Results  Component Value Date   PLT 129 (L) 10/29/2020    Diet :  Diet Order            Diet renal/carb modified with fluid restriction Diet-HS Snack? Nothing; Fluid restriction: 1200 mL Fluid; Room service appropriate? Yes; Fluid consistency: Thin  Diet effective now                  Inpatient Medications  Scheduled Meds: . sodium chloride   Intravenous Once  . Chlorhexidine Gluconate Cloth  6 each Topical Q0600  . ferrous sulfate  325 mg Oral TID WC  . furosemide  20 mg Intravenous Once  . midodrine  10 mg Oral TID WC  . pantoprazole (PROTONIX) IV  40 mg Intravenous Q12H  . rosuvastatin  10 mg Oral Daily   Continuous Infusions: . sodium chloride 10 mL/hr at 10/27/20 2038  . cefTRIAXone (ROCEPHIN)  IV Stopped (10/28/20 2118)  . lactated ringers     And  . lactated ringers    . norepinephrine (LEVOPHED) Adult infusion Stopped (10/28/20 0533)   PRN Meds:.acetaminophen, diphenhydrAMINE, docusate sodium, ondansetron (ZOFRAN) IV, polyethylene glycol  Antibiotics  :    Anti-infectives (From admission, onward)   Start     Dose/Rate Route Frequency Ordered Stop   10/27/20 2100  cefTRIAXone (ROCEPHIN) 2 g in sodium chloride 0.9 % 100 mL IVPB        2 g 200 mL/hr  over 30 Minutes Intravenous Every 24 hours 10/27/20 1047     10/27/20 2000  vancomycin (VANCOREADY) IVPB 1500 mg/300 mL  Status:  Discontinued        1,500 mg 150 mL/hr over 120 Minutes Intravenous Every 24 hours 10/26/20 1923 10/27/20 0633   10/27/20 0900  ceFEPIme (MAXIPIME) 2 g in sodium chloride 0.9 % 100 mL IVPB  Status:  Discontinued        2 g 200 mL/hr over 30 Minutes Intravenous Every 12 hours 10/27/20 0806 10/27/20 1047    10/27/20 0632  vancomycin variable dose per unstable renal function (pharmacist dosing)  Status:  Discontinued         Does not apply See admin instructions 10/27/20 0633 10/27/20 0945   10/27/20 0600  ceFEPIme (MAXIPIME) 1 g in sodium chloride 0.9 % 100 mL IVPB  Status:  Discontinued        1 g 200 mL/hr over 30 Minutes Intravenous Every 12 hours 10/26/20 1923 10/27/20 0806   10/26/20 1830  vancomycin (VANCOREADY) IVPB 2000 mg/400 mL        2,000 mg 200 mL/hr over 120 Minutes Intravenous  Once 10/26/20 1755 10/26/20 2157   10/26/20 1800  ceFEPIme (MAXIPIME) 2 g in sodium chloride 0.9 % 100 mL IVPB        2 g 200 mL/hr over 30 Minutes Intravenous  Once 10/26/20 1755 10/26/20 1857       Time Spent in minutes  30   Lala Lund M.D on 10/29/2020 at 10:57 AM  To page go to [email or web address]   [facility] -  Office  [phone]    See all Orders from today for further details    Objective:   Vitals:   10/28/20 2220 10/29/20 0355 10/29/20 0525 10/29/20 0800  BP: 97/64 (!) 90/59  (!) 96/57  Pulse: 79 64  67  Resp: 20 20  13   Temp: 98.6 F (37 C) 98.2 F (36.8 C)    TempSrc: Oral Oral    SpO2: 97% 100%  95%  Weight:   124.6 kg   Height:        Wt Readings from Last 3 Encounters:  10/29/20 124.6 kg  09/05/20 122.5 kg  06/21/20 128.7 kg     Intake/Output Summary (Last 24 hours) at 10/29/2020 1057 Last data filed at 10/29/2020 1000 Gross per 24 hour  Intake 945.26 ml  Output 250 ml  Net 695.26 ml     Physical Exam  Awake Alert, No new F.N deficits, Normal affect [place].AT,PERRAL Supple Neck,No JVD, No cervical lymphadenopathy appriciated.  Symmetrical Chest wall movement, Good air movement bilaterally, CTAB RRR,No Gallops,Rubs or new Murmurs, No Parasternal Heave +ve B.Sounds, Abd Soft, No tenderness, No organomegaly appriciated, No rebound - guarding or rigidity. No Cyanosis, Clubbing or edema, Foley in place    Data Review:    CBC Recent Labs  Lab  10/26/20 1807 10/26/20 2353 10/27/20 0118 10/28/20 0042 10/29/20 0031  WBC 1.4*  --  15.0* 26.1* 21.7*  HGB 11.7* 9.2* 9.7* 8.8* 7.2*  HCT 38.1* 27.0* 31.2* 26.7* 22.0*  PLT 206  --  208 169 129*  MCV 61.2*  --  60.3* 58.3* 57.9*  MCH 18.8*  --  18.8* 19.2* 18.9*  MCHC 30.7  --  31.1 33.0 32.7  RDW 17.8*  --  17.3* 16.5* 16.1*  LYMPHSABS 0.5*  --   --   --   --   MONOABS 0.0*  --   --   --   --  EOSABS 0.0  --   --   --   --   BASOSABS 0.0  --   --   --   --     Recent Labs  Lab 10/26/20 1807 10/26/20 1808 10/26/20 2009 10/26/20 2353 10/27/20 0118 10/27/20 1255 10/28/20 0042 10/29/20 0031 10/29/20 0747  NA 135  --   --  140 139 134* 136 135  --   K 3.0*  --   --  2.6* 2.6* 3.7 4.0 3.5  --   CL 99  --   --   --  101 101 101 103  --   CO2 21*  --   --   --  18* 18* 20* 21*  --   GLUCOSE 127*  --   --   --  87 167* 139* 79  --   BUN 11  --   --   --  18 18 18 18   --   CREATININE 1.74*  --   --   --  2.17* 1.87* 1.53* 1.17  --   CALCIUM 8.8*  --   --   --  8.5* 7.7* 7.5* 7.6*  --   AST 23  --   --   --   --   --   --   --   --   ALT 10  --   --   --   --   --   --   --   --   ALKPHOS 147*  --   --   --   --   --   --   --   --   BILITOT 1.7*  --   --   --   --   --   --   --   --   ALBUMIN 2.9*  --   --   --   --   --   --   --   --   MG  --   --   --   --  1.2* 1.5*  --   --  1.8  DDIMER  --   --   --   --   --   --   --   --  3.29*  PROCALCITON 1.18  --   --   --  >150.00  --  146.62  --  66.81  LATICACIDVEN  --  5.1* 8.1*  --   --   --   --   --   --   INR 2.1*  --   --   --   --   --   --   --   --   BNP  --   --   --   --   --   --   --   --  681.7*    ------------------------------------------------------------------------------------------------------------------ No results for input(s): CHOL, HDL, LDLCALC, TRIG, CHOLHDL, LDLDIRECT in the last 72 hours.  Lab Results  Component Value Date   HGBA1C 5.8 (H) 06/18/2020    ------------------------------------------------------------------------------------------------------------------ No results for input(s): TSH, T4TOTAL, T3FREE, THYROIDAB in the last 72 hours.  Invalid input(s): FREET3  Cardiac Enzymes No results for input(s): CKMB, TROPONINI, MYOGLOBIN in the last 168 hours.  Invalid input(s): CK ------------------------------------------------------------------------------------------------------------------    Component Value Date/Time   BNP 681.7 (H) 10/29/2020 0747    Radiology Reports DG Chest Port 1 View  Result Date: 10/26/2020 CLINICAL DATA:  Blood in urine back. Eliquis for history of DVT. Unresponsive,  tachycardic, and hypertensive. EXAM: PORTABLE CHEST 1 VIEW COMPARISON:  09/22/2020 FINDINGS: Shallow inspiration. Heart size and pulmonary vascularity are normal for inspiratory effort. No focal consolidation or airspace disease in the lungs. No pleural effusions. No pneumothorax. Mediastinal contours appear intact. Degenerative changes in the spine. IMPRESSION: No active disease. Electronically Signed   By: Lucienne Capers M.D.   On: 10/26/2020 19:19   ECHOCARDIOGRAM COMPLETE  Result Date: 10/27/2020    ECHOCARDIOGRAM REPORT   Patient Name:   Shane [name] Date of Exam: 10/27/2020 Medical Rec #:  [id number]          Height:       74.0 in Accession #:    [id number]         Weight:       250.0 lb Date of Birth:  10/09/1945          BSA:          2.390 m Patient Age:    15 years           BP:           97/58 mmHg Patient Gender: M                  HR:           81 bpm. Exam Location:  Inpatient Procedure: 2D Echo, Cardiac Doppler and Color Doppler Indications:    CHF  History:        Patient has prior history of Echocardiogram examinations, most                 recent 06/19/2020. Risk Factors:Hypertension and Diabetes.  Sonographer:    Cammy Brochure Referring Phys: [id number] ADEWALE A Ander Slade  Sonographer Comments: Image acquisition  challenging due to patient body habitus and Image acquisition challenging due to respiratory motion. IMPRESSIONS  1. Inferior and inferoseptal hypokinesis. Left ventricular ejection fraction, by estimation, is 50 to 55%. The left ventricle has low normal function. The left ventricle demonstrates regional wall motion abnormalities (see scoring diagram/findings for description). There is moderate concentric left ventricular hypertrophy. Left ventricular diastolic parameters are consistent with Grade I diastolic dysfunction (impaired relaxation).  2. Right ventricular systolic function is normal. The right ventricular size is normal.  3. The mitral valve is normal in structure. No evidence of mitral valve regurgitation. No evidence of mitral stenosis.  4. The aortic valve is tricuspid. Aortic valve regurgitation is not visualized. No aortic stenosis is present.  5. The inferior vena cava is normal in size with greater than 50% respiratory variability, suggesting right atrial pressure of 3 mmHg. FINDINGS  Left Ventricle: Inferior and inferoseptal hypokinesis. Left ventricular ejection fraction, by estimation, is 50 to 55%. The left ventricle has low normal function. The left ventricle demonstrates regional wall motion abnormalities. The left ventricular internal cavity size was normal in size. There is moderate concentric left ventricular hypertrophy. Left ventricular diastolic parameters are consistent with Grade I diastolic dysfunction (impaired relaxation). Indeterminate filling pressures. Right Ventricle: The right ventricular size is normal. No increase in right ventricular wall thickness. Right ventricular systolic function is normal. Left Atrium: Left atrial size was normal in size. Right Atrium: Right atrial size was normal in size. Pericardium: There is no evidence of pericardial effusion. Mitral Valve: The mitral valve is normal in structure. No evidence of mitral valve regurgitation. No evidence of mitral  valve stenosis. Tricuspid Valve: The tricuspid valve is normal in structure. Tricuspid valve regurgitation is mild . No evidence  of tricuspid stenosis. Aortic Valve: The aortic valve is tricuspid. Aortic valve regurgitation is not visualized. No aortic stenosis is present. Aortic valve mean gradient measures 5.0 mmHg. Aortic valve peak gradient measures 9.6 mmHg. Aortic valve area, by VTI measures 3.42 cm. Pulmonic Valve: The pulmonic valve was normal in structure. Pulmonic valve regurgitation is trivial. No evidence of pulmonic stenosis. Aorta: The aortic root is normal in size and structure. Venous: The inferior vena cava is normal in size with greater than 50% respiratory variability, suggesting right atrial pressure of 3 mmHg. IAS/Shunts: No atrial level shunt detected by color flow Doppler.  LEFT VENTRICLE PLAX 2D LVIDd:         4.20 cm  Diastology LVIDs:         2.80 cm  LV e' medial:    6.74 cm/s LV PW:         1.40 cm  LV E/e' medial:  10.1 LV IVS:        1.60 cm  LV e' lateral:   10.60 cm/s LVOT diam:     2.30 cm  LV E/e' lateral: 6.4 LV SV:         81 LV SV Index:   34 LVOT Area:     4.15 cm  RIGHT VENTRICLE RV S prime:     11.10 cm/s TAPSE (M-mode): 2.0 cm LEFT ATRIUM             Index       RIGHT ATRIUM           Index LA diam:        3.80 cm 1.59 cm/m  RA Area:     19.00 cm LA Vol (A2C):   43.6 ml 18.25 ml/m RA Volume:   50.50 ml  21.13 ml/m LA Vol (A4C):   32.3 ml 13.52 ml/m LA Biplane Vol: 38.1 ml 15.94 ml/m  AORTIC VALVE AV Area (Vmax):    4.05 cm AV Area (Vmean):   3.63 cm AV Area (VTI):     3.42 cm AV Vmax:           155.00 cm/s AV Vmean:          103.000 cm/s AV VTI:            0.238 m AV Peak Grad:      9.6 mmHg AV Mean Grad:      5.0 mmHg LVOT Vmax:         151.00 cm/s LVOT Vmean:        90.100 cm/s LVOT VTI:          0.196 m LVOT/AV VTI ratio: 0.82  AORTA Ao Root diam: 3.40 cm Ao Asc diam:  3.60 cm MITRAL VALVE               TRICUSPID VALVE MV Area (PHT): 4.15 cm    TR Peak grad:    15.7 mmHg MV Decel Time: 183 msec    TR Vmax:        198.00 cm/s MV E velocity: 67.90 cm/s MV A velocity: 61.10 cm/s  SHUNTS MV E/A ratio:  1.11        Systemic VTI:  0.20 m                            Systemic Diam: 2.30 cm Skeet Latch MD Electronically signed by Skeet Latch MD Signature Date/Time: 10/27/2020/4:25:51 PM    Final

## 2020-10-30 ENCOUNTER — Encounter (HOSPITAL_COMMUNITY)
Admission: EM | Disposition: A | Discharge status: Skilled Nursing Facility | Payer: Self-pay | Source: Home / Self Care | Attending: Internal Medicine

## 2020-10-30 ENCOUNTER — Inpatient Hospital Stay (HOSPITAL_COMMUNITY): Payer: PPO

## 2020-10-30 DIAGNOSIS — Z86718 Personal history of other venous thrombosis and embolism: Secondary | ICD-10-CM | POA: Diagnosis not present

## 2020-10-30 DIAGNOSIS — R7989 Other specified abnormal findings of blood chemistry: Secondary | ICD-10-CM

## 2020-10-30 DIAGNOSIS — R195 Other fecal abnormalities: Secondary | ICD-10-CM

## 2020-10-30 DIAGNOSIS — D62 Acute posthemorrhagic anemia: Secondary | ICD-10-CM

## 2020-10-30 DIAGNOSIS — D509 Iron deficiency anemia, unspecified: Secondary | ICD-10-CM

## 2020-10-30 DIAGNOSIS — K222 Esophageal obstruction: Secondary | ICD-10-CM

## 2020-10-30 LAB — COMPREHENSIVE METABOLIC PANEL
ALT: 16 U/L (ref 0–44)
AST: 43 U/L — ABNORMAL HIGH (ref 15–41)
Albumin: 1.8 g/dL — ABNORMAL LOW (ref 3.5–5.0)
Alkaline Phosphatase: 148 U/L — ABNORMAL HIGH (ref 38–126)
Anion gap: 8 (ref 5–15)
BUN: 15 mg/dL (ref 8–23)
CO2: 25 mmol/L (ref 22–32)
Calcium: 8 mg/dL — ABNORMAL LOW (ref 8.9–10.3)
Chloride: 106 mmol/L (ref 98–111)
Creatinine, Ser: 1.02 mg/dL (ref 0.61–1.24)
GFR, Estimated: >60 mL/min (ref >60)
Glucose, Bld: 76 mg/dL (ref 70–99)
Potassium: 3.2 mmol/L — ABNORMAL LOW (ref 3.5–5.1)
Sodium: 139 mmol/L (ref 135–145)
Total Bilirubin: 0.6 mg/dL (ref 0.3–1.2)
Total Protein: 5 g/dL — ABNORMAL LOW (ref 6.5–8.1)

## 2020-10-30 LAB — PROCALCITONIN: Procalcitonin: 48.33 ng/mL

## 2020-10-30 LAB — CBC WITH DIFFERENTIAL/PLATELET
Abs Immature Granulocytes: 0.12 10*3/uL — ABNORMAL HIGH (ref 0.00–0.07)
Basophils Absolute: 0.1 10*3/uL (ref 0.0–0.1)
Basophils Relative: 0 %
Eosinophils Absolute: 0.3 10*3/uL (ref 0.0–0.5)
Eosinophils Relative: 2 %
HCT: 27.5 % — ABNORMAL LOW (ref 39.0–52.0)
Hemoglobin: 9.2 g/dL — ABNORMAL LOW (ref 13.0–17.0)
Immature Granulocytes: 1 %
Lymphocytes Relative: 10 %
Lymphs Abs: 1.9 10*3/uL (ref 0.7–4.0)
MCH: 20.4 pg — ABNORMAL LOW (ref 26.0–34.0)
MCHC: 33.5 g/dL (ref 30.0–36.0)
MCV: 61.1 fL — ABNORMAL LOW (ref 80.0–100.0)
Monocytes Absolute: 0.9 10*3/uL (ref 0.1–1.0)
Monocytes Relative: 5 %
Neutro Abs: 14.8 10*3/uL — ABNORMAL HIGH (ref 1.7–7.7)
Neutrophils Relative %: 82 %
Platelets: 123 10*3/uL — ABNORMAL LOW (ref 150–400)
RBC: 4.5 MIL/uL (ref 4.22–5.81)
RDW: 21.8 % — ABNORMAL HIGH (ref 11.5–15.5)
WBC: 18 10*3/uL — ABNORMAL HIGH (ref 4.0–10.5)
nRBC: 0.1 % (ref 0.0–0.2)

## 2020-10-30 LAB — BPAM RBC
Blood Product Expiration Date: 202205302359
Blood Product Expiration Date: 202205302359
ISSUE DATE / TIME: 202205011238
ISSUE DATE / TIME: 202205011524
Unit Type and Rh: 5100
Unit Type and Rh: 5100

## 2020-10-30 LAB — TYPE AND SCREEN
ABO/RH(D): O POS
Antibody Screen: NEGATIVE
Unit division: 0
Unit division: 0

## 2020-10-30 LAB — C-REACTIVE PROTEIN: CRP: 14.3 mg/dL — ABNORMAL HIGH (ref <1.0)

## 2020-10-30 LAB — PREPARE RBC (CROSSMATCH)

## 2020-10-30 LAB — BRAIN NATRIURETIC PEPTIDE: B Natriuretic Peptide: 593.4 pg/mL — ABNORMAL HIGH (ref 0.0–100.0)

## 2020-10-30 LAB — MAGNESIUM: Magnesium: 1.7 mg/dL (ref 1.7–2.4)

## 2020-10-30 SURGERY — ESOPHAGOGASTRODUODENOSCOPY (EGD) WITH PROPOFOL
Anesthesia: Monitor Anesthesia Care

## 2020-10-30 MED ORDER — PEG-KCL-NACL-NASULF-NA ASC-C 100 G PO SOLR: 1.0000 | Freq: Once | ORAL | Status: DC

## 2020-10-30 MED ORDER — METOCLOPRAMIDE HCL 5 MG/ML IJ SOLN
10.0000 mg | Freq: Once | INTRAMUSCULAR | Status: AC
  Administered 2020-10-30: 10 mg via INTRAVENOUS

## 2020-10-30 MED ORDER — PEG-KCL-NACL-NASULF-NA ASC-C 100 G PO SOLR
0.5000 | Freq: Once | ORAL | Status: DC
  Filled 2020-10-30: qty 1

## 2020-10-30 MED ORDER — POTASSIUM CHLORIDE CRYS ER 20 MEQ PO TBCR
40.0000 meq | EXTENDED_RELEASE_TABLET | Freq: Once | ORAL | Status: AC
  Administered 2020-10-30: 40 meq via ORAL
  Filled 2020-10-30: qty 2

## 2020-10-30 MED ORDER — METOCLOPRAMIDE HCL 5 MG/ML IJ SOLN
10.0000 mg | Freq: Once | INTRAMUSCULAR | Status: DC
  Filled 2020-10-30: qty 2

## 2020-10-30 MED ORDER — LACTATED RINGERS IV BOLUS
500.0000 mL | Freq: Once | INTRAVENOUS | Status: AC
  Administered 2020-10-30: 500 mL via INTRAVENOUS

## 2020-10-30 MED ORDER — BISACODYL 5 MG PO TBEC
20.0000 mg | DELAYED_RELEASE_TABLET | Freq: Once | ORAL | Status: AC
  Administered 2020-10-30: 20 mg via ORAL
  Filled 2020-10-30: qty 4

## 2020-10-30 MED ORDER — POLYETHYLENE GLYCOL 3350 17 G PO PACK: 17.0000 g | PACK | Freq: Once | ORAL | Status: DC

## 2020-10-30 MED ORDER — PEG-KCL-NACL-NASULF-NA ASC-C 100 G PO SOLR
0.5000 | Freq: Once | ORAL | Status: AC
  Administered 2020-10-30: 100 g via ORAL
  Filled 2020-10-30: qty 1

## 2020-10-30 MED ORDER — CEPHALEXIN 500 MG PO CAPS
500.0000 mg | ORAL_CAPSULE | Freq: Four times a day (QID) | ORAL | Status: DC
  Administered 2020-10-31 – 2020-11-02 (×9): 500 mg via ORAL
  Filled 2020-10-30 (×10): qty 1

## 2020-10-30 MED ORDER — HEPARIN SODIUM (PORCINE) 5000 UNIT/ML IJ SOLN
5000.0000 [IU] | Freq: Three times a day (TID) | INTRAMUSCULAR | Status: DC
  Administered 2020-10-30 – 2020-11-02 (×10): 5000 [IU] via SUBCUTANEOUS
  Filled 2020-10-30 (×10): qty 1

## 2020-10-30 NOTE — Progress Notes (Signed)
PROGRESS NOTE                                                                                                                                                                                                             Patient Demographics:    Shane [name], is a 75 y.o. male, DOB - 1946-02-09, EX:[id number]  Outpatient Primary MD for the patient is Pcp, No    LOS - 4  Admit date - 10/26/2020    Chief Complaint  Patient presents with  . Hematuria       Brief Narrative (HPI from H&P) - This is a 75 year old black male that presented from a skilled nursing facility.  Very limited history is available of recent events.  The patient was sent to the emergency room because he was less responsive than normal and that he had hematuria after having a Foley catheter replaced on 10/25/2020, patient was diagnosed with sepsis due to E. coli UTI, initially stabilized in ICU then transferred to hospitalist service on 10/29/2017.   Subjective:   Patient in bed, appears comfortable, denies any headache, no fever, no chest pain or pressure, no shortness of breath , no abdominal pain. No new focal weakness.   Assessment  & Plan :     1.  Septic shock E. coli UTI and Bacteremia in the presence of chronic indwelling Foley catheter which was switched at SNF on 10/25/2020.  Sepsis pathophysiology has also been, post IVF, continue Rocephin at Bacteremia dose, procalcitonin trending down appears nontoxic.  2.  AKI caused by hypoperfusion due to septic shock.  Improving after hydration.  3.  Chronic indwelling Foley catheter.  Replaced on 10/25/2020 at SNF.  Outpatient follow-up with urology.  4.  Non-ACS pattern elevation of troponin.  Likely due to demand mismatch caused by hypotension in the presence of septic shock, EF has dropped a little bit as compared to previous echo, he has been evaluated by cardiology who recommends outpatient cardiology  follow-up and a repeat echocardiogram likely in the next few weeks.  He is chest pain-free already on Eliquis, currently blood pressure too low for beta-blockers, will place him on some statin for secondary prevention.   5.  Microcytic anemia in the presence of ongoing Eliquis use.  IV PPI, stable anemia panel. Patient was Hemoccult positive on 10/27/2019, post 2 units of  packed RBC transfusion on 10/29/2020 with stable posttransfusion H&H, GI on board likely endoscopy once Eliquis has been washed out.  6. H/O DVTs.  As in #5 above Eliquis held currently on prophylactic dose heparin, check leg ultrasound and monitor clinically.  7.  Metabolic encephalopathy.  Due to sepsis, improving.  No focal deficits, may require placement for deconditioning and weakness.      Condition - Extremely Guarded  Family Communication  : None Present  Code Status :  DNR  Consults  :  PCCM, Cards, GI  PUD Prophylaxis : PPI   Procedures  :       Leg [place] -   TTE - 1. Inferior and inferoseptal hypokinesis. Left ventricular ejection fraction, by estimation, is 50 to 55%. The left ventricle has low normal function. The left ventricle demonstrates regional wall motion abnormalities (see scoring diagram/findings for description). There is moderate concentric left ventricular hypertrophy. Left ventricular diastolic parameters are consistent with Grade I diastolic dysfunction (impaired relaxation).  2. Right ventricular systolic function is normal. The right ventricular size is normal.  3. The mitral valve is normal in structure. No evidence of mitral valve regurgitation. No evidence of mitral stenosis.  4. The aortic valve is tricuspid. Aortic valve regurgitation is not visualized. No aortic stenosis is present.  5. The inferior vena cava is normal in size with greater than 50% respiratory variability, suggesting right atrial pressure of 3 mmHg.       Disposition Plan  :    Status is: Inpatient  Remains inpatient  appropriate because:IV treatments appropriate due to intensity of illness or inability to take PO   Dispo: The patient is from: SNF              Anticipated d/c is to: SNF              Patient currently is not medically stable to d/c.   Difficult to place patient No  DVT Prophylaxis  :    heparin injection 5,000 Units Start: 10/30/20 0745 SCDs Start: 10/26/20 2320    Lab Results  Component Value Date   PLT 123 (L) 10/30/2020    Diet :  Diet Order            Diet renal/carb modified with fluid restriction Diet-HS Snack? Nothing; Fluid restriction: 1200 mL Fluid; Room service appropriate? Yes; Fluid consistency: Thin  Diet effective now                  Inpatient Medications  Scheduled Meds: . Chlorhexidine Gluconate Cloth  6 each Topical Q0600  . ferrous sulfate  325 mg Oral TID WC  . heparin injection (subcutaneous)  5,000 Units Subcutaneous Q8H  . midodrine  10 mg Oral TID WC  . pantoprazole (PROTONIX) IV  40 mg Intravenous Q12H  . rosuvastatin  10 mg Oral Daily   Continuous Infusions: . cefTRIAXone (ROCEPHIN)  IV Stopped (10/29/20 2148)   PRN Meds:.acetaminophen, docusate sodium, ondansetron (ZOFRAN) IV, polyethylene glycol    Time Spent in minutes  30   Lala Lund M.D on 10/30/2020 at 10:23 AM  To page go to [email or web address]   [facility] -  Office  [phone]    See all Orders from today for further details    Objective:   Vitals:   10/29/20 2000 10/30/20 0000 10/30/20 0331 10/30/20 0415  BP: 100/72 99/62  94/67  Pulse: 60 61  60  Resp: 18 18  16   Temp: 98.4 F (36.9 C) 98.5  F (36.9 C)  98.1 F (36.7 C)  TempSrc: Oral Oral  Oral  SpO2: 94% 93%  95%  Weight:   121.9 kg   Height:        Wt Readings from Last 3 Encounters:  10/30/20 121.9 kg  09/05/20 122.5 kg  06/21/20 128.7 kg     Intake/Output Summary (Last 24 hours) at 10/30/2020 1023 Last data filed at 10/30/2020 [id number] Gross per 24 hour  Intake 1024.14 ml  Output 2250 ml   Net -1225.86 ml     Physical Exam  Awake Alert, No new F.N deficits, flat affect [place].AT,PERRAL Supple Neck,No JVD, No cervical lymphadenopathy appriciated.  Symmetrical Chest wall movement, Good air movement bilaterally, CTAB RRR,No Gallops, Rubs or new Murmurs, No Parasternal Heave +ve B.Sounds, Abd Soft, No tenderness, No organomegaly appriciated, No rebound - guarding or rigidity. No Cyanosis, Clubbing or edema, Foley in place    Data Review:    CBC Recent Labs  Lab 10/26/20 1807 10/26/20 2353 10/27/20 0118 10/28/20 0042 10/29/20 0031 10/29/20 2207 10/30/20 0104  WBC 1.4*  --  15.0* 26.1* 21.7*  --  18.0*  HGB 11.7*   < > 9.7* 8.8* 7.2* 9.3* 9.2*  HCT 38.1*   < > 31.2* 26.7* 22.0* 28.3* 27.5*  PLT 206  --  208 169 129*  --  123*  MCV 61.2*  --  60.3* 58.3* 57.9*  --  61.1*  MCH 18.8*  --  18.8* 19.2* 18.9*  --  20.4*  MCHC 30.7  --  31.1 33.0 32.7  --  33.5  RDW 17.8*  --  17.3* 16.5* 16.1*  --  21.8*  LYMPHSABS 0.5*  --   --   --   --   --  1.9  MONOABS 0.0*  --   --   --   --   --  0.9  EOSABS 0.0  --   --   --   --   --  0.3  BASOSABS 0.0  --   --   --   --   --  0.1   < > = values in this interval not displayed.    Recent Labs  Lab 10/26/20 1807 10/26/20 1808 10/26/20 2009 10/26/20 2353 10/27/20 0118 10/27/20 1255 10/28/20 0042 10/29/20 0031 10/29/20 0747 10/30/20 0104  NA 135  --   --    < > 139 134* 136 135  --  139  K 3.0*  --   --    < > 2.6* 3.7 4.0 3.5  --  3.2*  CL 99  --   --   --  101 101 101 103  --  106  CO2 21*  --   --   --  18* 18* 20* 21*  --  25  GLUCOSE 127*  --   --   --  87 167* 139* 79  --  76  BUN 11  --   --   --  18 18 18 18   --  15  CREATININE 1.74*  --   --   --  2.17* 1.87* 1.53* 1.17  --  1.02  CALCIUM 8.8*  --   --   --  8.5* 7.7* 7.5* 7.6*  --  8.0*  AST 23  --   --   --   --   --   --   --   --  43*  ALT 10  --   --   --   --   --   --   --   --  16  ALKPHOS 147*  --   --   --   --   --   --   --   --  148*   BILITOT 1.7*  --   --   --   --   --   --   --   --  0.6  ALBUMIN 2.9*  --   --   --   --   --   --   --   --  1.8*  MG  --   --   --   --  1.2* 1.5*  --   --  1.8 1.7  CRP  --   --   --   --   --   --   --   --  20.5* 14.3*  DDIMER  --   --   --   --   --   --   --   --  3.29*  --   PROCALCITON 1.18  --   --   --  >150.00  --  146.62  --  66.81 48.33  LATICACIDVEN  --  5.1* 8.1*  --   --   --   --   --   --   --   INR 2.1*  --   --   --   --   --   --   --   --   --   TSH  --   --   --   --   --   --   --   --  4.748*  --   BNP  --   --   --   --   --   --   --   --  681.7* 593.4*   < > = values in this interval not displayed.    ------------------------------------------------------------------------------------------------------------------ No results for input(s): CHOL, HDL, LDLCALC, TRIG, CHOLHDL, LDLDIRECT in the last 72 hours.  Lab Results  Component Value Date   HGBA1C 5.8 (H) 06/18/2020   ------------------------------------------------------------------------------------------------------------------ Recent Labs    10/29/20 0747  TSH 4.748*    Cardiac Enzymes No results for input(s): CKMB, TROPONINI, MYOGLOBIN in the last 168 hours.  Invalid input(s): CK ------------------------------------------------------------------------------------------------------------------    Component Value Date/Time   BNP 593.4 (H) 10/30/2020 0104    Radiology Reports DG Chest Port 1 View  Result Date: 10/26/2020 CLINICAL DATA:  Blood in urine back. Eliquis for history of DVT. Unresponsive, tachycardic, and hypertensive. EXAM: PORTABLE CHEST 1 VIEW COMPARISON:  09/22/2020 FINDINGS: Shallow inspiration. Heart size and pulmonary vascularity are normal for inspiratory effort. No focal consolidation or airspace disease in the lungs. No pleural effusions. No pneumothorax. Mediastinal contours appear intact. Degenerative changes in the spine. IMPRESSION: No active disease. Electronically  Signed   By: Lucienne Capers M.D.   On: 10/26/2020 19:19   ECHOCARDIOGRAM COMPLETE  Result Date: 10/27/2020    ECHOCARDIOGRAM REPORT   Patient Name:   Shane [name] Date of Exam: 10/27/2020 Medical Rec #:  [id number]          Height:       74.0 in Accession #:    [id number]         Weight:       250.0 lb Date of Birth:  24-Dec-1945          BSA:          2.390 m Patient Age:    67 years  BP:           97/58 mmHg Patient Gender: M                  HR:           81 bpm. Exam Location:  Inpatient Procedure: 2D Echo, Cardiac Doppler and Color Doppler Indications:    CHF  History:        Patient has prior history of Echocardiogram examinations, most                 recent 06/19/2020. Risk Factors:Hypertension and Diabetes.  Sonographer:    Cammy Brochure Referring Phys: [id number] ADEWALE A Ander Slade  Sonographer Comments: Image acquisition challenging due to patient body habitus and Image acquisition challenging due to respiratory motion. IMPRESSIONS  1. Inferior and inferoseptal hypokinesis. Left ventricular ejection fraction, by estimation, is 50 to 55%. The left ventricle has low normal function. The left ventricle demonstrates regional wall motion abnormalities (see scoring diagram/findings for description). There is moderate concentric left ventricular hypertrophy. Left ventricular diastolic parameters are consistent with Grade I diastolic dysfunction (impaired relaxation).  2. Right ventricular systolic function is normal. The right ventricular size is normal.  3. The mitral valve is normal in structure. No evidence of mitral valve regurgitation. No evidence of mitral stenosis.  4. The aortic valve is tricuspid. Aortic valve regurgitation is not visualized. No aortic stenosis is present.  5. The inferior vena cava is normal in size with greater than 50% respiratory variability, suggesting right atrial pressure of 3 mmHg. FINDINGS  Left Ventricle: Inferior and inferoseptal hypokinesis. Left  ventricular ejection fraction, by estimation, is 50 to 55%. The left ventricle has low normal function. The left ventricle demonstrates regional wall motion abnormalities. The left ventricular internal cavity size was normal in size. There is moderate concentric left ventricular hypertrophy. Left ventricular diastolic parameters are consistent with Grade I diastolic dysfunction (impaired relaxation). Indeterminate filling pressures. Right Ventricle: The right ventricular size is normal. No increase in right ventricular wall thickness. Right ventricular systolic function is normal. Left Atrium: Left atrial size was normal in size. Right Atrium: Right atrial size was normal in size. Pericardium: There is no evidence of pericardial effusion. Mitral Valve: The mitral valve is normal in structure. No evidence of mitral valve regurgitation. No evidence of mitral valve stenosis. Tricuspid Valve: The tricuspid valve is normal in structure. Tricuspid valve regurgitation is mild . No evidence of tricuspid stenosis. Aortic Valve: The aortic valve is tricuspid. Aortic valve regurgitation is not visualized. No aortic stenosis is present. Aortic valve mean gradient measures 5.0 mmHg. Aortic valve peak gradient measures 9.6 mmHg. Aortic valve area, by VTI measures 3.42 cm. Pulmonic Valve: The pulmonic valve was normal in structure. Pulmonic valve regurgitation is trivial. No evidence of pulmonic stenosis. Aorta: The aortic root is normal in size and structure. Venous: The inferior vena cava is normal in size with greater than 50% respiratory variability, suggesting right atrial pressure of 3 mmHg. IAS/Shunts: No atrial level shunt detected by color flow Doppler.  LEFT VENTRICLE PLAX 2D LVIDd:         4.20 cm  Diastology LVIDs:         2.80 cm  LV e' medial:    6.74 cm/s LV PW:         1.40 cm  LV E/e' medial:  10.1 LV IVS:        1.60 cm  LV e' lateral:   10.60 cm/s LVOT diam:  2.30 cm  LV E/e' lateral: 6.4 LV SV:         81  LV SV Index:   34 LVOT Area:     4.15 cm  RIGHT VENTRICLE RV S prime:     11.10 cm/s TAPSE (M-mode): 2.0 cm LEFT ATRIUM             Index       RIGHT ATRIUM           Index LA diam:        3.80 cm 1.59 cm/m  RA Area:     19.00 cm LA Vol (A2C):   43.6 ml 18.25 ml/m RA Volume:   50.50 ml  21.13 ml/m LA Vol (A4C):   32.3 ml 13.52 ml/m LA Biplane Vol: 38.1 ml 15.94 ml/m  AORTIC VALVE AV Area (Vmax):    4.05 cm AV Area (Vmean):   3.63 cm AV Area (VTI):     3.42 cm AV Vmax:           155.00 cm/s AV Vmean:          103.000 cm/s AV VTI:            0.238 m AV Peak Grad:      9.6 mmHg AV Mean Grad:      5.0 mmHg LVOT Vmax:         151.00 cm/s LVOT Vmean:        90.100 cm/s LVOT VTI:          0.196 m LVOT/AV VTI ratio: 0.82  AORTA Ao Root diam: 3.40 cm Ao Asc diam:  3.60 cm MITRAL VALVE               TRICUSPID VALVE MV Area (PHT): 4.15 cm    TR Peak grad:   15.7 mmHg MV Decel Time: 183 msec    TR Vmax:        198.00 cm/s MV E velocity: 67.90 cm/s MV A velocity: 61.10 cm/s  SHUNTS MV E/A ratio:  1.11        Systemic VTI:  0.20 m                            Systemic Diam: 2.30 cm Skeet Latch MD Electronically signed by Skeet Latch MD Signature Date/Time: 10/27/2020/4:25:51 PM    Final

## 2020-10-30 NOTE — Progress Notes (Signed)
Physical Therapy Treatment Patient Details Name: Shane [name] MRN: [id number] DOB: 1946/04/22 Today's Date: 10/30/2020    History of Present Illness This is a 75 year old black male that presented from a skilled nursing facility.  Very limited history is available of recent events.  The patient was sent to the emergency room because he was less responsive than normal and that he had hematuria after having a Foley catheter replaced yesterday.Found to have septic shock due to E. coli UTI .  Acute renal insufficiency  Intravascular volume depletion  Metabolic encephalopathy,Microcytic anemia with Hemoccult positive stool    PT Comments    Shane [name] was cleared by Dr. Candiss [name] for participation in PT today. Patient received in bed, brother present and assisted in helping to motivate patient to participate. Shane [name] was with very flat affect today, and needed heavy multimodal cues for all tasks as well as increased time for processing and initiation. Able to get to EOB with less assist today, but unfortunately we were unable to get to standing today- had difficulty getting him to focus on/initiate task of standing due to cognition. Left in bed positioned to comfort with all needs met, bed alarm active and brother present. Continue to recommend SNF.    Follow Up Recommendations  SNF     Equipment Recommendations  None recommended by PT    Recommendations for Other Services       Precautions / Restrictions Precautions Precautions: Fall Restrictions Weight Bearing Restrictions: No    Mobility  Bed Mobility Overal bed mobility: Needs Assistance Bed Mobility: Supine to Sit;Sit to Supine     Supine to sit: Max assist;HOB elevated Sit to supine: Max assist;+2 for physical assistance   General bed mobility comments: able to get to EOB with MaxA of 1 person and HOB elevated- once leg moveement was initated by PT, he brought his trunk up without external assist! Did need maxAx2 to  return to bed in part from perseverating on needing to pee    Transfers                 General transfer comment: attempted- unable to clear buttocks from bed even with multiple attempts. Zero initation from patietn.  Ambulation/Gait             General Gait Details: unable   Stairs             Wheelchair Mobility    Modified Rankin (Stroke Patients Only)       Balance Overall balance assessment: Needs assistance Sitting-balance support: Feet supported;Bilateral upper extremity supported Sitting balance-Leahy Scale: Good Sitting balance - Comments: standby assist for balance     Standing balance-Leahy Scale: Zero Standing balance comment: unable to stand fully with +2 A                            Cognition Arousal/Alertness: Awake/alert Behavior During Therapy: Flat affect Overall Cognitive Status: Impaired/Different from baseline Area of Impairment: Attention;Memory;Following commands;Awareness;Safety/judgement;Problem solving                   Current Attention Level: Sustained Memory: Decreased short-term memory Following Commands: Follows one step commands inconsistently;Follows one step commands with increased time Safety/Judgement: Decreased awareness of safety Awareness: Intellectual Problem Solving: Difficulty sequencing;Requires verbal cues;Requires tactile cues General Comments: perseverating on needing to pee even tho he has foley cath in place; needed heavy multimodal commands as well as increased processing time with little awareness  of situation/safety      Exercises      General Comments        Pertinent Vitals/Pain Pain Assessment: Faces Faces Pain Scale: Hurts even more Pain Location: LLE with movement (from knee to foot) Pain Descriptors / Indicators: Aching;Sore Pain Intervention(s): Limited activity within patient's tolerance;Monitored during session    Home Living                       Prior Function            PT Goals (current goals can now be found in the care plan section) Acute Rehab PT Goals Patient Stated Goal: To lay back down once he sat up on EOB with [place] PT Goal Formulation: With patient/family Time For Goal Achievement: 11/12/20 Potential to Achieve Goals: Fair Progress towards PT goals: Progressing toward goals    Frequency    Min 2X/week      PT Plan Current plan remains appropriate    Co-evaluation              AM-PAC PT "6 Clicks" Mobility   Outcome Measure  Help needed turning from your back to your side while in a flat bed without using bedrails?: A Lot Help needed moving from lying on your back to sitting on the side of a flat bed without using bedrails?: Total Help needed moving to and from a bed to a chair (including a wheelchair)?: Total Help needed standing up from a chair using your arms (e.g., wheelchair or bedside chair)?: Total Help needed to walk in hospital room?: Total Help needed climbing 3-5 steps with a railing? : Total 6 Click Score: 7    End of Session Equipment Utilized During Treatment: Gait belt Activity Tolerance: Patient tolerated treatment well Patient left: in bed;with call bell/phone within reach;with family/visitor present;with bed alarm set Nurse Communication: Mobility status PT Visit Diagnosis: Other abnormalities of gait and mobility (R26.89);Muscle weakness (generalized) (M62.81);Difficulty in walking, not elsewhere classified (R26.2)     Time: 1278-7183 PT Time Calculation (min) (ACUTE ONLY): 30 min  Charges:  $Therapeutic Activity: 23-37 mins                     Shane [name], DPT, PN1   Supplemental Physical Shane [name]    Pager [phone] Acute Rehab Office [phone]

## 2020-10-30 NOTE — Anesthesia Preprocedure Evaluation (Addendum)
Anesthesia Evaluation  Patient identified by MRN, date of birth, ID band Patient awake    Reviewed: Allergy & Precautions, NPO status , Patient's Chart, lab work & pertinent test results  History of Anesthesia Complications Negative for: history of anesthetic complications  Airway Mallampati: II  TM Distance: >3 FB Neck ROM: Full    Dental  (+) Partial Lower, Partial Upper, Dental Advisory Given   Pulmonary asthma , sleep apnea (does not use CPAP) , COPD,  COPD inhaler, former smoker,  10/26/2020 SARS coronavirus NEG   breath sounds clear to auscultation       Cardiovascular hypertension, Pt. on medications (-) angina+ DVT   Rhythm:Regular Rate:Normal  09/2020 ECHO: EF 50-55%, inferior and inferoseptal hypokinesis, mod LVH, grade 1 DD, no significant valvular abnormalities   Neuro/Psych Anxiety Depression negative neurological ROS     GI/Hepatic Neg liver ROS, GERD  Controlled,  Endo/Other  Morbid obesity  Renal/GU Renal InsufficiencyRenal disease   urosepsis    Musculoskeletal  (+) Arthritis ,   Abdominal (+) + obese,   Peds  Hematology  (+) Blood dyscrasia (Hb 9.2), anemia , Eliquis   Anesthesia Other Findings   Reproductive/Obstetrics                            Anesthesia Physical Anesthesia Plan  ASA: IV  Anesthesia Plan: MAC   Post-op Pain Management:    Induction:   PONV Risk Score and Plan: 1  Airway Management Planned: Natural Airway and Simple Face Mask  Additional Equipment: None  Intra-op Plan:   Post-operative Plan:   Informed Consent: I have reviewed the patients History and Physical, chart, labs and discussed the procedure including the risks, benefits and alternatives for the proposed anesthesia with the patient or authorized representative who has indicated his/her understanding and acceptance.   Patient has DNR.  Discussed DNR with patient and Suspend  DNR.   Dental advisory given  Plan Discussed with: CRNA and Surgeon  Anesthesia Plan Comments:        Anesthesia Quick Evaluation

## 2020-10-30 NOTE — Progress Notes (Signed)
PT Cancellation Note  Patient Details Name: Shane [name] MRN: [id number] DOB: April 15, 1946   Cancelled Treatment:    Reason Eval/Treat Not Completed: Medical issues which prohibited therapy per chart, looks like he is getting a doppler today to r/o DVT. Will follow closely and attempt to return once results are available, if time/schedule allow.    Windell Norfolk, DPT, PN1   Supplemental Physical Therapist [facility]    Pager [phone] Acute Rehab Office [phone]

## 2020-10-30 NOTE — Progress Notes (Addendum)
Attending physician's note   I have taken an interval history, reviewed the chart and examined the patient. I agree with the Advanced Practitioner's note, impression, and recommendations as outlined.   AKI resolved Baseline Hgb ~12 with MCV low 60's in 08/2020; admission Hgb 7.2 with 57.9. Stable H/H since 2 unit PRBC transfusion.  Otherwise hemodynamically stable.   - Plan for EGD/colonoscopy tomorrow following Eliquis washout (last dose 10/29/2020) - Clears today with n.p.o. at midnight - Bowel prep this evening - Will need SWS consult for dispo planning  The indications, risks, and benefits of EGD and colonoscopy were explained to the patient in detail along with his brother by telephone per patient request. Risks include but are not limited to bleeding, perforation, adverse reaction to medications, and cardiopulmonary compromise. Sequelae include but are not limited to the possibility of surgery, prolonged hospitalization, and mortality. The patient verbalized understanding and wished to proceed.   [place], DO, FACG [phone] office                                                                      Daily Rounding Note  10/30/2020, 10:25 AM  LOS: 4 days   SUBJECTIVE:   Chief complaint: FOBT + anemia.       C/o discomfort in left upper quadrant that started a few hours ago.  No nausea or vomiting.  Last bowel movement was a few days ago. Nausea or vomiting.  OBJECTIVE:         Vital signs in last 24 hours:    Temp:  [97.6 F (36.4 C)-98.6 F (37 C)] 98.2 F (36.8 C) (05/02 0830) Pulse Rate:  [60-69] 63 (05/02 0830) Resp:  [13-21] 15 (05/02 0830) BP: (94-118)/(61-82) 106/61 (05/02 0830) SpO2:  [93 %-98 %] 96 % (05/02 0830) Weight:  [121.9 kg] 121.9 kg (05/02 0331) Last BM Date: 10/27/20 Filed Weights   10/28/20 0500 10/29/20 0525 10/30/20 0331  Weight: 115 kg 124.6 kg 121.9 kg   General: Morbidly obese.  Delayed responses to questions. Heart:  RRR. Chest: Clear bilaterally without labored breathing. Abdomen: Large, nontender, nondistended, active bowel sounds. Extremities: Left greater than right lower extremity edema.  SCDs and socks not removed for exam. Neuro/Psych: Patient has a marked delay in responding to questions.  When he does answer the questions they are appropriate responses.  Moves all 4 limbs without tremor.  Oriented to situation, self, 2022.  Intake/Output from previous day: 05/01 0701 - 05/02 0700 In: 1389.4 [P.O.:120; I.V.:592.8; Blood:576; IV Piggyback:100.6] Out: 2250 [Urine:2250]  Intake/Output this shift: Total I/O In: 100 [IV Piggyback:100] Out: -   Lab Results: Recent Labs    10/28/20 0042 10/29/20 0031 10/29/20 2207 10/30/20 0104  WBC 26.1* 21.7*  --  18.0*  HGB 8.8* 7.2* 9.3* 9.2*  HCT 26.7* 22.0* 28.3* 27.5*  PLT 169 129*  --  123*   BMET Recent Labs    10/28/20 0042 10/29/20 0031 10/30/20 0104  NA 136 135 139  K 4.0 3.5 3.2*  CL 101 103 106  CO2 20* 21* 25  GLUCOSE 139* 79 76  BUN 18 18 15   CREATININE 1.53* 1.17 1.02  CALCIUM 7.5* 7.6* 8.0*   LFT Recent Labs    10/30/20 0104  PROT 5.0*  ALBUMIN 1.8*  AST 43*  ALT 16  ALKPHOS 148*  BILITOT 0.6   PT/INR No results for input(s): LABPROT, INR in the last 72 hours. Hepatitis Panel No results for input(s): HEPBSAG, HCVAB, HEPAIGM, HEPBIGM in the last 72 hours.  Studies/Results: No results found.  ASSESMENT:   *    FOBT positive anemia.  Microcytosis. S/p RBCs x2.  Adenomatous and sessile serrated polyps on colonoscopy 03/2017. Small hiatal hernia on 08/2012 EGD.  *   E. coli UTI with sepsis, bacteremia..  *    chronic DVT RLE, on Eliquis.  *    Stage 3 CKD.  AKI improved.  *    metabolic encephalopathy.  *    SNF resident at baseline.  PLAN   *    Plan EGD and colonoscopy on 5/3.   *   Begin clear liquids now.  Give Dulcolax p.o. now and begin split dose movie prep bowel prep's evening.     Shane [name]  10/30/2020, 10:25 AM Phone [phone]

## 2020-10-30 NOTE — H&P (View-Only) (Signed)
Attending physician's note   I have taken an interval history, reviewed the chart and examined the patient. I agree with the Advanced Practitioner's note, impression, and recommendations as outlined.   AKI resolved Baseline Hgb ~12 with MCV low 60's in 08/2020; admission Hgb 7.2 with 57.9. Stable H/H since 2 unit PRBC transfusion.  Otherwise hemodynamically stable.   - Plan for EGD/colonoscopy tomorrow following Eliquis washout (last dose 10/29/2020) - Clears today with n.p.o. at midnight - Bowel prep this evening - Will need SWS consult for dispo planning  The indications, risks, and benefits of EGD and colonoscopy were explained to the patient in detail along with his brother by telephone per patient request. Risks include but are not limited to bleeding, perforation, adverse reaction to medications, and cardiopulmonary compromise. Sequelae include but are not limited to the possibility of surgery, prolonged hospitalization, and mortality. The patient verbalized understanding and wished to proceed.   [place], DO, FACG [phone] office                                                                      Daily Rounding Note  10/30/2020, 10:25 AM  LOS: 4 days   SUBJECTIVE:   Chief complaint: FOBT + anemia.       C/o discomfort in left upper quadrant that started a few hours ago.  No nausea or vomiting.  Last bowel movement was a few days ago. Nausea or vomiting.  OBJECTIVE:         Vital signs in last 24 hours:    Temp:  [97.6 F (36.4 C)-98.6 F (37 C)] 98.2 F (36.8 C) (05/02 0830) Pulse Rate:  [60-69] 63 (05/02 0830) Resp:  [13-21] 15 (05/02 0830) BP: (94-118)/(61-82) 106/61 (05/02 0830) SpO2:  [93 %-98 %] 96 % (05/02 0830) Weight:  [121.9 kg] 121.9 kg (05/02 0331) Last BM Date: 10/27/20 Filed Weights   10/28/20 0500 10/29/20 0525 10/30/20 0331  Weight: 115 kg 124.6 kg 121.9 kg   General: Morbidly obese.  Delayed responses to questions. Heart:  RRR. Chest: Clear bilaterally without labored breathing. Abdomen: Large, nontender, nondistended, active bowel sounds. Extremities: Left greater than right lower extremity edema.  SCDs and socks not removed for exam. Neuro/Psych: Patient has a marked delay in responding to questions.  When he does answer the questions they are appropriate responses.  Moves all 4 limbs without tremor.  Oriented to situation, self, 2022.  Intake/Output from previous day: 05/01 0701 - 05/02 0700 In: 1389.4 [P.O.:120; I.V.:592.8; Blood:576; IV Piggyback:100.6] Out: 2250 [Urine:2250]  Intake/Output this shift: Total I/O In: 100 [IV Piggyback:100] Out: -   Lab Results: Recent Labs    10/28/20 0042 10/29/20 0031 10/29/20 2207 10/30/20 0104  WBC 26.1* 21.7*  --  18.0*  HGB 8.8* 7.2* 9.3* 9.2*  HCT 26.7* 22.0* 28.3* 27.5*  PLT 169 129*  --  123*   BMET Recent Labs    10/28/20 0042 10/29/20 0031 10/30/20 0104  NA 136 135 139  K 4.0 3.5 3.2*  CL 101 103 106  CO2 20* 21* 25  GLUCOSE 139* 79 76  BUN 18 18 15   CREATININE 1.53* 1.17 1.02  CALCIUM 7.5* 7.6* 8.0*   LFT Recent Labs    10/30/20 0104  PROT 5.0*  ALBUMIN 1.8*  AST 43*  ALT 16  ALKPHOS 148*  BILITOT 0.6   PT/INR No results for input(s): LABPROT, INR in the last 72 hours. Hepatitis Panel No results for input(s): HEPBSAG, HCVAB, HEPAIGM, HEPBIGM in the last 72 hours.  Studies/Results: No results found.  ASSESMENT:   *    FOBT positive anemia.  Microcytosis. S/p RBCs x2.  Adenomatous and sessile serrated polyps on colonoscopy 03/2017. Small hiatal hernia on 08/2012 EGD.  *   E. coli UTI with sepsis, bacteremia..  *    chronic DVT RLE, on Eliquis.  *    Stage 3 CKD.  AKI improved.  *    metabolic encephalopathy.  *    SNF resident at baseline.  PLAN   *    Plan EGD and colonoscopy on 5/3.   *   Begin clear liquids now.  Give Dulcolax p.o. now and begin split dose movie prep bowel prep's evening.     [name]  10/30/2020, 10:25 AM Phone [phone]

## 2020-10-30 NOTE — Progress Notes (Signed)
Lower extremity venous bilateral study completed.   Please see CV Proc for preliminary results.   Darlin Coco, RDMS, RVT on behalf of [place], RDMS, RVT

## 2020-10-31 ENCOUNTER — Inpatient Hospital Stay (HOSPITAL_COMMUNITY): Payer: PPO | Admitting: Anesthesiology

## 2020-10-31 ENCOUNTER — Encounter (HOSPITAL_COMMUNITY): Payer: Self-pay | Admitting: Pulmonary Disease

## 2020-10-31 ENCOUNTER — Encounter (HOSPITAL_COMMUNITY)
Admission: EM | Disposition: A | Discharge status: Skilled Nursing Facility | Payer: Self-pay | Source: Home / Self Care | Attending: Internal Medicine

## 2020-10-31 DIAGNOSIS — K449 Diaphragmatic hernia without obstruction or gangrene: Secondary | ICD-10-CM

## 2020-10-31 DIAGNOSIS — K2970 Gastritis, unspecified, without bleeding: Secondary | ICD-10-CM

## 2020-10-31 DIAGNOSIS — K254 Chronic or unspecified gastric ulcer with hemorrhage: Secondary | ICD-10-CM

## 2020-10-31 DIAGNOSIS — K2980 Duodenitis without bleeding: Secondary | ICD-10-CM

## 2020-10-31 DIAGNOSIS — D132 Benign neoplasm of duodenum: Secondary | ICD-10-CM

## 2020-10-31 HISTORY — PX: ESOPHAGOGASTRODUODENOSCOPY (EGD) WITH PROPOFOL: SHX5813

## 2020-10-31 HISTORY — PX: POLYPECTOMY: SHX5525

## 2020-10-31 HISTORY — PX: HEMOSTASIS CLIP PLACEMENT: SHX6857

## 2020-10-31 HISTORY — PX: BIOPSY: SHX5522

## 2020-10-31 LAB — BRAIN NATRIURETIC PEPTIDE: B Natriuretic Peptide: 548.1 pg/mL — ABNORMAL HIGH (ref 0.0–100.0)

## 2020-10-31 LAB — CBC WITH DIFFERENTIAL/PLATELET
Abs Immature Granulocytes: 0.45 10*3/uL — ABNORMAL HIGH (ref 0.00–0.07)
Basophils Absolute: 0 10*3/uL (ref 0.0–0.1)
Basophils Relative: 0 %
Eosinophils Absolute: 0.3 10*3/uL (ref 0.0–0.5)
Eosinophils Relative: 2 %
HCT: 32.2 % — ABNORMAL LOW (ref 39.0–52.0)
Hemoglobin: 10.5 g/dL — ABNORMAL LOW (ref 13.0–17.0)
Immature Granulocytes: 3 %
Lymphocytes Relative: 15 %
Lymphs Abs: 2.2 10*3/uL (ref 0.7–4.0)
MCH: 20 pg — ABNORMAL LOW (ref 26.0–34.0)
MCHC: 32.6 g/dL (ref 30.0–36.0)
MCV: 61.3 fL — ABNORMAL LOW (ref 80.0–100.0)
Monocytes Absolute: 1.2 10*3/uL — ABNORMAL HIGH (ref 0.1–1.0)
Monocytes Relative: 9 %
Neutro Abs: 9.7 10*3/uL — ABNORMAL HIGH (ref 1.7–7.7)
Neutrophils Relative %: 71 %
Platelets: 158 10*3/uL (ref 150–400)
RBC: 5.25 MIL/uL (ref 4.22–5.81)
RDW: 22 % — ABNORMAL HIGH (ref 11.5–15.5)
WBC: 13.9 10*3/uL — ABNORMAL HIGH (ref 4.0–10.5)
nRBC: 0.3 % — ABNORMAL HIGH (ref 0.0–0.2)

## 2020-10-31 LAB — COMPREHENSIVE METABOLIC PANEL
ALT: 16 U/L (ref 0–44)
AST: 31 U/L (ref 15–41)
Albumin: 2 g/dL — ABNORMAL LOW (ref 3.5–5.0)
Alkaline Phosphatase: 191 U/L — ABNORMAL HIGH (ref 38–126)
Anion gap: 8 (ref 5–15)
BUN: 14 mg/dL (ref 8–23)
CO2: 22 mmol/L (ref 22–32)
Calcium: 8.3 mg/dL — ABNORMAL LOW (ref 8.9–10.3)
Chloride: 109 mmol/L (ref 98–111)
Creatinine, Ser: 0.97 mg/dL (ref 0.61–1.24)
GFR, Estimated: >60 mL/min (ref >60)
Glucose, Bld: 85 mg/dL (ref 70–99)
Potassium: 3.2 mmol/L — ABNORMAL LOW (ref 3.5–5.1)
Sodium: 139 mmol/L (ref 135–145)
Total Bilirubin: 0.7 mg/dL (ref 0.3–1.2)
Total Protein: 5.6 g/dL — ABNORMAL LOW (ref 6.5–8.1)

## 2020-10-31 LAB — PROCALCITONIN: Procalcitonin: 23.13 ng/mL

## 2020-10-31 LAB — C-REACTIVE PROTEIN: CRP: 9 mg/dL — ABNORMAL HIGH (ref <1.0)

## 2020-10-31 LAB — MAGNESIUM: Magnesium: 1.5 mg/dL — ABNORMAL LOW (ref 1.7–2.4)

## 2020-10-31 SURGERY — ESOPHAGOGASTRODUODENOSCOPY (EGD) WITH PROPOFOL
Anesthesia: Monitor Anesthesia Care

## 2020-10-31 MED ORDER — MAGNESIUM SULFATE 2 GM/50ML IV SOLN
2.0000 g | Freq: Once | INTRAVENOUS | Status: AC
  Administered 2020-10-31: 2 g via INTRAVENOUS
  Filled 2020-10-31: qty 50

## 2020-10-31 MED ORDER — PROPOFOL 500 MG/50ML IV EMUL
INTRAVENOUS | Status: DC | PRN
  Administered 2020-10-31: 100 ug/kg/min via INTRAVENOUS

## 2020-10-31 MED ORDER — LACTATED RINGERS IV SOLN: INTRAVENOUS | Status: DC

## 2020-10-31 MED ORDER — PHENYLEPHRINE 40 MCG/ML (10ML) SYRINGE FOR IV PUSH (FOR BLOOD PRESSURE SUPPORT)
PREFILLED_SYRINGE | INTRAVENOUS | Status: DC | PRN
Start: 2020-10-31 — End: 2020-10-31
  Administered 2020-10-31: 120 ug via INTRAVENOUS
  Administered 2020-10-31: 40 ug via INTRAVENOUS
  Administered 2020-10-31 (×2): 120 ug via INTRAVENOUS

## 2020-10-31 MED ORDER — METOPROLOL TARTRATE 25 MG PO TABS
25.0000 mg | ORAL_TABLET | Freq: Two times a day (BID) | ORAL | Status: DC
  Administered 2020-10-31 – 2020-11-02 (×5): 25 mg via ORAL
  Filled 2020-10-31 (×5): qty 1

## 2020-10-31 MED ORDER — POTASSIUM CHLORIDE CRYS ER 20 MEQ PO TBCR
40.0000 meq | EXTENDED_RELEASE_TABLET | Freq: Once | ORAL | Status: AC
  Administered 2020-10-31: 40 meq via ORAL
  Filled 2020-10-31 (×2): qty 2

## 2020-10-31 SURGICAL SUPPLY — 15 items

## 2020-10-31 NOTE — Therapy (Signed)
Pt refused hospital cpap unit currently on unit in room.

## 2020-10-31 NOTE — Transfer of Care (Signed)
Immediate Anesthesia Transfer of Care Note  Patient: Shane [name]  Procedure(s) Performed: ESOPHAGOGASTRODUODENOSCOPY (EGD) WITH PROPOFO (N/A ) HEMOSTASIS CLIP PLACEMENT POLYPECTOMY BIOPSY  Patient Location: Endoscopy Unit  Anesthesia Type:MAC  Level of Consciousness: drowsy  Airway & Oxygen Therapy: Patient Spontanous Breathing and Patient connected to face mask oxygen  Post-op Assessment: Report given to RN, Post -op Vital signs reviewed and stable and Patient moving all extremities  Post vital signs: Reviewed and stable  Last Vitals:  Vitals Value Taken Time  BP    Temp    Pulse    Resp    SpO2      Last Pain:  Vitals:   10/31/20 0723  TempSrc:   PainSc: 0-No pain      Patients Stated Pain Goal: 0 (40/45/91 3685)  Complications: No complications documented.

## 2020-10-31 NOTE — Anesthesia Procedure Notes (Signed)
Procedure Name: MAC Date/Time: 10/31/2020 7:50 AM Performed by: Jenne Campus, CRNA Oxygen Delivery Method: Simple face mask

## 2020-10-31 NOTE — Anesthesia Postprocedure Evaluation (Signed)
Anesthesia Post Note  Patient: Shane [name]  Procedure(s) Performed: ESOPHAGOGASTRODUODENOSCOPY (EGD) WITH PROPOFO (N/A ) HEMOSTASIS CLIP PLACEMENT POLYPECTOMY BIOPSY     Patient location during evaluation: Endoscopy Anesthesia Type: MAC Level of consciousness: sedated, patient cooperative and oriented Pain management: pain level controlled Vital Signs Assessment: post-procedure vital signs reviewed and stable Respiratory status: spontaneous breathing, nonlabored ventilation, respiratory function stable and patient connected to nasal cannula oxygen Cardiovascular status: blood pressure returned to baseline and stable Postop Assessment: no apparent nausea or vomiting Anesthetic complications: no   No complications documented.  Last Vitals:  Vitals:   10/31/20 0723 10/31/20 0834  BP: 137/70 102/67  Pulse: 71 73  Resp: 16 (!) 23  Temp:  (!) 36.2 C  SpO2: 98% 100%    Last Pain:  Vitals:   10/31/20 0834  TempSrc: Temporal  PainSc: 0-No pain                 Genoveva Singleton,E. Dariush Mcnellis

## 2020-10-31 NOTE — Op Note (Signed)
[facility] Patient Name: Shane [name] Procedure Date : 10/31/2020 MRN: [id number] Attending MD: Gerrit Heck , MD Date of Birth: 1946/06/04 CSN: [id number] Age: 75 Admit Type: Inpatient Procedure:                Upper GI endoscopy Indications:              Acute post hemorrhagic anemia, Iron deficiency                            anemia, Heme positive stool Providers:                Gerrit Heck, MD, Janee Morn, Technician,                            Kary Kos RN, RN Referring MD:              Medicines:                Monitored Anesthesia Care Complications:            No immediate complications. Estimated Blood Loss:     Estimated blood loss was minimal. Procedure:                Pre-Anesthesia Assessment:                           - Prior to the procedure, a History and Physical                            was performed, and patient medications and                            allergies were reviewed. The patient's tolerance of                            previous anesthesia was also reviewed. The risks                            and benefits of the procedure and the sedation                            options and risks were discussed with the patient.                            All questions were answered, and informed consent                            was obtained. Prior Anticoagulants: The patient has                            taken Eliquis (apixaban), last dose was 3 days                            prior to procedure. ASA Grade Assessment: III - A  patient with severe systemic disease. After                            reviewing the risks and benefits, the patient was                            deemed in satisfactory condition to undergo the                            procedure.                           After obtaining informed consent, the endoscope was                            passed under direct vision. Throughout  the                            procedure, the patient's blood pressure, pulse, and                            oxygen saturations were monitored continuously. The                            [id number]-[id number]) Olympus gastroscope was                            introduced through the mouth, and advanced to the                            second part of duodenum. The upper GI endoscopy was                            accomplished without difficulty. The patient                            tolerated the procedure well. Scope In: Scope Out: Findings:      A non-obstructing Schatzki ring was found in the lower third of the       esophagus.      A 2 cm hiatal hernia was present.      One oozing superficial gastric ulcer was found in the gastric fundus.       The lesion was 3 mm in largest dimension. For hemostasis, two hemostatic       clips were successfully placed (MR conditional). There was no bleeding       at the end of the procedure.      Scattered mild inflammation characterized by erythema was found in the       gastric fundus, in the gastric body and in the gastric antrum. Biopsies       were taken with a cold forceps for histology. Estimated blood loss was       minimal.      A single 14 mm sessile polyp with no bleeding was found in the duodenal       bulb. This appeared to have an ulcerated tip, but no active  bleeding or       stigmata of recent bleeding. The polyp was removed with a hot snare.       Resection and retrieval were complete. Given recent bleed and need to       resume anticoagulation, the defect was closed using three hemostatic       clips. There was no bleeding at the end of the procedure. Estimated       blood loss: none.      Localized moderate inflammation characterized by congestion (edema),       erosions and erythema was found in the duodenal bulb. Biopsies were       taken with a cold forceps for histology. Estimated blood loss was       minimal.      The  second portion of the duodenum was normal. Impression:               - Non-obstructing Schatzki ring.                           - 2 cm hiatal hernia.                           - Oozing gastric ulcer. Clips (MR conditional) were                            placed.                           - Gastritis. Biopsied.                           - A single duodenal polyp. Resected and retrieved.                            Clips were placed.                           - Duodenitis. Biopsied.                           - Normal second portion of the duodenum. Recommendation:           - Return patient to hospital ward for ongoing care.                           - Full liquid diet today, and can advance as                            tolerated tomorrow.                           - Continue present medications.                           - Use Protonix (pantoprazole) 40 mg PO BID for 8                            weeks, then reduce to 40 mg daily.                           -  If no further bleeding, ok to resume Eliquis                            (apixaban) at prior dose in 3 days.                           - Await pathology results.                           - Repeat upper endoscopy in 6-8 weeks to check                            healing.                           - If continued anemia or concern for ongoing                            bleeding, will need colonoscopy on this admission                            (refused prep last night) for additional diagnostic                            and therapeutic intent. If no more evidence of                            bleeding, will plan on colonoscopy as an outpatient                            for ongoing polyp surveillance.                           - GI service will sign off at this time. Please do                            not hesitate to contact with additional questions                            or if concern for ongoing bleeding. Otherwise, will                             start to arrange for outpatient follow-up in the GI                            clinic to include repeat CBC check 7-10 days post                            discharge, follow-up appointment, EGD in 6-8 weeks,                            and colonoscopy. Procedure Code(s):        --- Professional ---  43255, 59, Esophagogastroduodenoscopy, flexible,                            transoral; with control of bleeding, any method                           43251, Esophagogastroduodenoscopy, flexible,                            transoral; with removal of tumor(s), polyp(s), or                            other lesion(s) by snare technique                           43239, 59, Esophagogastroduodenoscopy, flexible,                            transoral; with biopsy, single or multiple Diagnosis Code(s):        --- Professional ---                           K22.2, Esophageal obstruction                           K44.9, Diaphragmatic hernia without obstruction or                            gangrene                           K25.4, Chronic or unspecified gastric ulcer with                            hemorrhage                           K29.70, Gastritis, unspecified, without bleeding                           K31.7, Polyp of stomach and duodenum                           K29.80, Duodenitis without bleeding                           D62, Acute posthemorrhagic anemia                           D50.9, Iron deficiency anemia, unspecified                           R19.5, Other fecal abnormalities CPT copyright 2019 [employer]. All rights reserved. The codes documented in this report are preliminary and upon coder review may  be revised to meet current compliance requirements. Gerrit Heck, MD 10/31/2020 8:44:57 AM Number of Addenda: 0

## 2020-10-31 NOTE — Interval H&P Note (Signed)
**Note Shane-Identified via Obfuscation** History and Physical Interval Note:   Patient refused prep overnight, so colonoscopy cancelled. Will proceed with EGD for diagnostic and therapeutic intent. If EGD unrevealing, will again plan for slow prep through the day, additional prep overnight, and repeat attempt at colonoscopy tomorrow.   10/31/2020 7:27 AM  Shane [name]  has presented today for surgery, with the diagnosis of Anemia.  The various methods of treatment have been discussed with the patient and family. After consideration of risks, benefits and other options for treatment, the patient has consented to  Procedure(s): ESOPHAGOGASTRODUODENOSCOPY (EGD) WITH PROPOFO (N/A) as a surgical intervention.  The patient's history has been reviewed, patient examined, no change in status, stable for surgery.  I have reviewed the patient's chart and labs.  Questions were answered to the patient's satisfaction.     Shane [name]

## 2020-10-31 NOTE — Progress Notes (Addendum)
PROGRESS NOTE                                                                                                                                                                                                             Patient Demographics:    Shane [name], is a 75 y.o. male, DOB - [birth year], CNO:[id number]  Outpatient Primary MD for the patient is Pcp, No    LOS - 5  Admit date - 10/26/2020    Chief Complaint  Patient presents with  . Hematuria       Brief Narrative (HPI from H&P) - This is a 75 year old black male that presented from a skilled nursing facility.  Very limited history is available of recent events.  The patient was sent to the emergency room because he was less responsive than normal and that he had hematuria after having a Foley catheter replaced on 10/25/2020, patient was diagnosed with sepsis due to E. coli UTI, initially stabilized in ICU then transferred to hospitalist service on 10/29/2017.   Subjective:   Patient in bed, appears comfortable, denies any headache, no fever, no chest pain or pressure, no shortness of breath , no abdominal pain. No new focal weakness.   Assessment  & Plan :     1.  Septic shock E. coli UTI and Bacteremia in the presence of chronic indwelling Foley catheter which was switched at SNF on 10/25/2020.  Sepsis pathophysiology has also been, post IVF, he received Rocephin and for likely 5 days of oral antibiotics, procalcitonin trending down appears nontoxic.  Of note foley catheter was stitched again before his hospitalization at SNF.  2.  AKI caused by hypoperfusion due to septic shock.  Improving after hydration.  3.  Chronic indwelling Foley catheter.  Replaced on 10/25/2020 at SNF.  Outpatient follow-up with urology.  4.  Non-ACS pattern elevation of troponin.  Likely due to demand mismatch caused by hypotension in the presence of septic shock, EF has dropped a little bit  as compared to previous echo, he has been evaluated by cardiology who recommends outpatient cardiology follow-up and a repeat echocardiogram likely in the next few weeks.  He is chest pain-free will place him on low-dose on Lopressor and Statin (BP soft).   5.  Microcytic anemia in the presence of ongoing Eliquis use.  IV PPI, stable anemia  panel. Patient was Hemoccult positive on 10/27/2019, post 2 units of packed RBC transfusion on 10/29/2020 with stable posttransfusion H&H, seen by GI and underwent EGD 10/31/2020 showing large polyp in duodenum- removed and clip closed to prevent bleeding. Active bleeding ulcer in stomach- clip close. Mild gastritis/duodenitis- biopsied - stable ON PPI, DW GI Dr. Clarisa Fling, commence Eliquis on 11/03/20, outpt [facility] follow-up 2 to 3 weeks post discharge.  6. H/O DVTs.  As in #5 above Eliquis held currently on prophylactic dose heparin, leg ultrasound showing bilateral chronic DVTs, resume Eliquis 11/01/9824.  7.  Metabolic encephalopathy.  Due to sepsis, improving.  No focal deficits, may require placement for deconditioning and weakness.      Condition - Extremely Guarded  Family Communication  : None Present  Code Status :  DNR  Consults  :  PCCM, Cards, GI  PUD Prophylaxis : PPI   Procedures  :       EGD - large polyp in duodenum- removed and clip closed to prevent bleeding. Active bleeding ulcer in stomach- clip close. Mild gastritis/duodenitis- biopsied    Leg [place] -   RIGHT: - Findings consistent with 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    LEFT: - Findings consistent with chronic deep vein thrombosis involving the left femoral vein, left popliteal vein, and left posterior tibial veins.    TTE - 1. Inferior and inferoseptal hypokinesis. Left ventricular ejection fraction, by estimation, is 50 to 55%. The left ventricle has low normal function. The left ventricle  demonstrates regional wall motion abnormalities (see scoring diagram/findings for description). There is moderate concentric left ventricular hypertrophy. Left ventricular diastolic parameters are consistent with Grade I diastolic dysfunction (impaired relaxation).  2. Right ventricular systolic function is normal. The right ventricular size is normal.  3. The mitral valve is normal in structure. No evidence of mitral valve regurgitation. No evidence of mitral stenosis.  4. The aortic valve is tricuspid. Aortic valve regurgitation is not visualized. No aortic stenosis is present.  5. The inferior vena cava is normal in size with greater than 50% respiratory variability, suggesting right atrial pressure of 3 mmHg.       Disposition Plan  :    Status is: Inpatient  Remains inpatient appropriate because:IV treatments appropriate due to intensity of illness or inability to take PO   Dispo: The patient is from: SNF              Anticipated d/c is to: SNF              Patient currently is not medically stable to d/c.   Difficult to place patient No  DVT Prophylaxis  :    heparin injection 5,000 Units Start: 10/30/20 0745 SCDs Start: 10/26/20 2320    Lab Results  Component Value Date   PLT 158 10/31/2020    Diet :  Diet Order            Diet NPO time specified  Diet effective now                  Inpatient Medications  Scheduled Meds: . cephALEXin  500 mg Oral Q6H  . Chlorhexidine Gluconate Cloth  6 each Topical Q0600  . ferrous sulfate  325 mg Oral TID WC  . heparin injection (subcutaneous)  5,000 Units Subcutaneous Q8H  . midodrine  10 mg Oral TID WC  . pantoprazole (PROTONIX) IV  40 mg  Intravenous Q12H  . peg 3350 powder  0.5 kit Oral Once  . potassium chloride  40 mEq Oral Once  . rosuvastatin  10 mg Oral Daily   Continuous Infusions:  PRN Meds:.acetaminophen, docusate sodium, ondansetron (ZOFRAN) IV, polyethylene glycol     Lala Lund M.D on 10/31/2020 at  10:24 AM  To page go to [email or web address]   [facility] -  Office  [phone]    See all Orders from today for further details    Objective:   Vitals:   10/31/20 0834 10/31/20 0845 10/31/20 0855 10/31/20 0953  BP: 102/67 (!) 106/57 (!) 114/58 118/82  Pulse: 73 66 66 68  Resp: (!) 23 (!) 22 20 17   Temp: (!) 97.2 F (36.2 C)   98.2 F (36.8 C)  TempSrc: Temporal   Oral  SpO2: 100% 97% 99% 94%  Weight:      Height:        Wt Readings from Last 3 Encounters:  10/30/20 121.9 kg  09/05/20 122.5 kg  06/21/20 128.7 kg     Intake/Output Summary (Last 24 hours) at 10/31/2020 1024 Last data filed at 10/31/2020 0830 Gross per 24 hour  Intake 680 ml  Output 975 ml  Net -295 ml     Physical Exam  Awake Alert, No new F.N deficits, Normal affect [place].AT,PERRAL Supple Neck,No JVD, No cervical lymphadenopathy appriciated.  Symmetrical Chest wall movement, Good air movement bilaterally, CTAB RRR,No Gallops, Rubs or new Murmurs, No Parasternal Heave +ve B.Sounds, Abd Soft, No tenderness, No organomegaly appriciated, No rebound - guarding or rigidity. No Cyanosis, Foley in place, long toenails     Data Review:    CBC Recent Labs  Lab 10/26/20 1807 10/26/20 2353 10/27/20 0118 10/28/20 0042 10/29/20 0031 10/29/20 2207 10/30/20 0104 10/31/20 0312  WBC 1.4*  --  15.0* 26.1* 21.7*  --  18.0* 13.9*  HGB 11.7*   < > 9.7* 8.8* 7.2* 9.3* 9.2* 10.5*  HCT 38.1*   < > 31.2* 26.7* 22.0* 28.3* 27.5* 32.2*  PLT 206  --  208 169 129*  --  123* 158  MCV 61.2*  --  60.3* 58.3* 57.9*  --  61.1* 61.3*  MCH 18.8*  --  18.8* 19.2* 18.9*  --  20.4* 20.0*  MCHC 30.7  --  31.1 33.0 32.7  --  33.5 32.6  RDW 17.8*  --  17.3* 16.5* 16.1*  --  21.8* 22.0*  LYMPHSABS 0.5*  --   --   --   --   --  1.9 2.2  MONOABS 0.0*  --   --   --   --   --  0.9 1.2*  EOSABS 0.0  --   --   --   --   --  0.3 0.3  BASOSABS 0.0  --   --   --   --   --  0.1 0.0   < > = values in this interval not displayed.     Recent Labs  Lab 10/26/20 1807 10/26/20 1808 10/26/20 2009 10/26/20 2353 10/27/20 0118 10/27/20 1255 10/28/20 0042 10/29/20 0031 10/29/20 0747 10/30/20 0104 10/31/20 0312  NA 135  --   --    < > 139 134* 136 135  --  139 139  K 3.0*  --   --    < > 2.6* 3.7 4.0 3.5  --  3.2* 3.2*  CL 99  --   --   --  101 101 101 103  --  106 109  CO2 21*  --   --   --  18* 18* 20* 21*  --  25 22  GLUCOSE 127*  --   --   --  87 167* 139* 79  --  76 85  BUN 11  --   --   --  18 18 18 18   --  15 14  CREATININE 1.74*  --   --   --  2.17* 1.87* 1.53* 1.17  --  1.02 0.97  CALCIUM 8.8*  --   --   --  8.5* 7.7* 7.5* 7.6*  --  8.0* 8.3*  AST 23  --   --   --   --   --   --   --   --  43* 31  ALT 10  --   --   --   --   --   --   --   --  16 16  ALKPHOS 147*  --   --   --   --   --   --   --   --  148* 191*  BILITOT 1.7*  --   --   --   --   --   --   --   --  0.6 0.7  ALBUMIN 2.9*  --   --   --   --   --   --   --   --  1.8* 2.0*  MG  --   --   --   --  1.2* 1.5*  --   --  1.8 1.7 1.5*  CRP  --   --   --   --   --   --   --   --  20.5* 14.3* 9.0*  DDIMER  --   --   --   --   --   --   --   --  3.29*  --   --   PROCALCITON 1.18  --   --   --  >150.00  --  146.62  --  66.81 48.33 23.13  LATICACIDVEN  --  5.1* 8.1*  --   --   --   --   --   --   --   --   INR 2.1*  --   --   --   --   --   --   --   --   --   --   TSH  --   --   --   --   --   --   --   --  4.748*  --   --   BNP  --   --   --   --   --   --   --   --  681.7* 593.4* 548.1*   < > = values in this interval not displayed.    ------------------------------------------------------------------------------------------------------------------ No results for input(s): CHOL, HDL, LDLCALC, TRIG, CHOLHDL, LDLDIRECT in the last 72 hours.  Lab Results  Component Value Date   HGBA1C 5.8 (H) 06/18/2020   ------------------------------------------------------------------------------------------------------------------ Recent Labs     10/29/20 0747  TSH 4.748*    Cardiac Enzymes No results for input(s): CKMB, TROPONINI, MYOGLOBIN in the last 168 hours.  Invalid input(s): CK ------------------------------------------------------------------------------------------------------------------    Component Value Date/Time   BNP 548.1 (H) 10/31/2020 0312    Radiology Reports DG Chest Port 1 View  Result Date: 10/26/2020 CLINICAL DATA:  Blood in urine  back. Eliquis for history of DVT. Unresponsive, tachycardic, and hypertensive. EXAM: PORTABLE CHEST 1 VIEW COMPARISON:  09/22/2020 FINDINGS: Shallow inspiration. Heart size and pulmonary vascularity are normal for inspiratory effort. No focal consolidation or airspace disease in the lungs. No pleural effusions. No pneumothorax. Mediastinal contours appear intact. Degenerative changes in the spine. IMPRESSION: No active disease. Electronically Signed   By: Lucienne Capers M.D.   On: 10/26/2020 19:19   ECHOCARDIOGRAM COMPLETE  Result Date: 10/27/2020    ECHOCARDIOGRAM REPORT   Patient Name:   Shane [name] Date of Exam: 10/27/2020 Medical Rec #:  [id number]          Height:       74.0 in Accession #:    [id number]         Weight:       250.0 lb Date of Birth:  [birth year]          BSA:          2.390 m Patient Age:    50 years           BP:           97/58 mmHg Patient Gender: M                  HR:           81 bpm. Exam Location:  Inpatient Procedure: 2D Echo, Cardiac Doppler and Color Doppler Indications:    CHF  History:        Patient has prior history of Echocardiogram examinations, most                 recent 06/19/2020. Risk Factors:Hypertension and Diabetes.  Sonographer:    Cammy Brochure Referring Phys: [id number] ADEWALE A Ander Slade  Sonographer Comments: Image acquisition challenging due to patient body habitus and Image acquisition challenging due to respiratory motion. IMPRESSIONS  1. Inferior and inferoseptal hypokinesis. Left ventricular ejection fraction, by estimation,  is 50 to 55%. The left ventricle has low normal function. The left ventricle demonstrates regional wall motion abnormalities (see scoring diagram/findings for description). There is moderate concentric left ventricular hypertrophy. Left ventricular diastolic parameters are consistent with Grade I diastolic dysfunction (impaired relaxation).  2. Right ventricular systolic function is normal. The right ventricular size is normal.  3. The mitral valve is normal in structure. No evidence of mitral valve regurgitation. No evidence of mitral stenosis.  4. The aortic valve is tricuspid. Aortic valve regurgitation is not visualized. No aortic stenosis is present.  5. The inferior vena cava is normal in size with greater than 50% respiratory variability, suggesting right atrial pressure of 3 mmHg. FINDINGS  Left Ventricle: Inferior and inferoseptal hypokinesis. Left ventricular ejection fraction, by estimation, is 50 to 55%. The left ventricle has low normal function. The left ventricle demonstrates regional wall motion abnormalities. The left ventricular internal cavity size was normal in size. There is moderate concentric left ventricular hypertrophy. Left ventricular diastolic parameters are consistent with Grade I diastolic dysfunction (impaired relaxation). Indeterminate filling pressures. Right Ventricle: The right ventricular size is normal. No increase in right ventricular wall thickness. Right ventricular systolic function is normal. Left Atrium: Left atrial size was normal in size. Right Atrium: Right atrial size was normal in size. Pericardium: There is no evidence of pericardial effusion. Mitral Valve: The mitral valve is normal in structure. No evidence of mitral valve regurgitation. No evidence of mitral valve stenosis. Tricuspid Valve: The tricuspid valve is normal in structure. Tricuspid valve  regurgitation is mild . No evidence of tricuspid stenosis. Aortic Valve: The aortic valve is tricuspid. Aortic valve  regurgitation is not visualized. No aortic stenosis is present. Aortic valve mean gradient measures 5.0 mmHg. Aortic valve peak gradient measures 9.6 mmHg. Aortic valve area, by VTI measures 3.42 cm. Pulmonic Valve: The pulmonic valve was normal in structure. Pulmonic valve regurgitation is trivial. No evidence of pulmonic stenosis. Aorta: The aortic root is normal in size and structure. Venous: The inferior vena cava is normal in size with greater than 50% respiratory variability, suggesting right atrial pressure of 3 mmHg. IAS/Shunts: No atrial level shunt detected by color flow Doppler.  LEFT VENTRICLE PLAX 2D LVIDd:         4.20 cm  Diastology LVIDs:         2.80 cm  LV e' medial:    6.74 cm/s LV PW:         1.40 cm  LV E/e' medial:  10.1 LV IVS:        1.60 cm  LV e' lateral:   10.60 cm/s LVOT diam:     2.30 cm  LV E/e' lateral: 6.4 LV SV:         81 LV SV Index:   34 LVOT Area:     4.15 cm  RIGHT VENTRICLE RV S prime:     11.10 cm/s TAPSE (M-mode): 2.0 cm LEFT ATRIUM             Index       RIGHT ATRIUM           Index LA diam:        3.80 cm 1.59 cm/m  RA Area:     19.00 cm LA Vol (A2C):   43.6 ml 18.25 ml/m RA Volume:   50.50 ml  21.13 ml/m LA Vol (A4C):   32.3 ml 13.52 ml/m LA Biplane Vol: 38.1 ml 15.94 ml/m  AORTIC VALVE AV Area (Vmax):    4.05 cm AV Area (Vmean):   3.63 cm AV Area (VTI):     3.42 cm AV Vmax:           155.00 cm/s AV Vmean:          103.000 cm/s AV VTI:            0.238 m AV Peak Grad:      9.6 mmHg AV Mean Grad:      5.0 mmHg LVOT Vmax:         151.00 cm/s LVOT Vmean:        90.100 cm/s LVOT VTI:          0.196 m LVOT/AV VTI ratio: 0.82  AORTA Ao Root diam: 3.40 cm Ao Asc diam:  3.60 cm MITRAL VALVE               TRICUSPID VALVE MV Area (PHT): 4.15 cm    TR Peak grad:   15.7 mmHg MV Decel Time: 183 msec    TR Vmax:        198.00 cm/s MV E velocity: 67.90 cm/s MV A velocity: 61.10 cm/s  SHUNTS MV E/A ratio:  1.11        Systemic VTI:  0.20 m                            Systemic  Diam: 2.30 cm Skeet Latch MD Electronically signed by Skeet Latch MD Signature Date/Time: 10/27/2020/4:25:51 PM  Final    VAS [place] LOWER EXTREMITY VENOUS (DVT)  Result Date: 10/30/2020  Lower Venous DVT Study Patient Name:  KENNIS WISSMANN  Date of Exam:   10/30/2020 Medical Rec #: [id number]           Accession #:    [id number] Date of Birth: 01-17-1946           Patient Gender: M Patient Age:   75Y Exam Location:  [facility] Procedure:      VAS [place] LOWER EXTREMITY VENOUS (DVT) Referring Phys: [id number] Margaree Mackintosh [facility] --------------------------------------------------------------------------------  Indications: Rapidly rising d-dimer, history of chronic DVT.  Anticoagulation: Heparin injection. Limitations: Poor ultrasound/tissue interface. Comparison Study: 09-05-2020 Most recent prior lower venous exam was positive                   for chronic DVT involving the RT femoral vein, popliteal vein,                   and posterior tibial veins. Performing Technologist: Rogelia Rohrer RDMS, RVT  Examination Guidelines: A complete evaluation includes B-mode imaging, spectral Doppler, color Doppler, and power Doppler as needed of all accessible portions of each vessel. Bilateral testing is considered an integral part of a complete examination. Limited examinations for reoccurring indications may be performed as noted. The reflux portion of the exam is performed with the patient in reverse Trendelenburg.  +---------+---------------+---------+-----------+----------+-------------------+ RIGHT    CompressibilityPhasicitySpontaneityPropertiesThrombus Aging      +---------+---------------+---------+-----------+----------+-------------------+ CFV      Full           Yes      Yes                                      +---------+---------------+---------+-----------+----------+-------------------+ SFJ      Full                                                              +---------+---------------+---------+-----------+----------+-------------------+ FV Prox  Full                                                             +---------+---------------+---------+-----------+----------+-------------------+ FV Mid   Partial        Yes      Yes                  Chronic             +---------+---------------+---------+-----------+----------+-------------------+ FV DistalPartial        Yes      Yes                  Chronic             +---------+---------------+---------+-----------+----------+-------------------+ PFV      Full                                                             +---------+---------------+---------+-----------+----------+-------------------+  POP      Partial        Yes      Yes                  Chronic             +---------+---------------+---------+-----------+----------+-------------------+ PTV      Partial                                      Chronic             +---------+---------------+---------+-----------+----------+-------------------+ PERO                                                  Not well visualized +---------+---------------+---------+-----------+----------+-------------------+   +---------+---------------+---------+-----------+----------+--------------+ LEFT     CompressibilityPhasicitySpontaneityPropertiesThrombus Aging +---------+---------------+---------+-----------+----------+--------------+ CFV      Full           Yes      Yes                                 +---------+---------------+---------+-----------+----------+--------------+ SFJ      Full                                                        +---------+---------------+---------+-----------+----------+--------------+ FV Prox  Partial        Yes      Yes                  Chronic        +---------+---------------+---------+-----------+----------+--------------+ FV Mid   None           No       No                    Chronic        +---------+---------------+---------+-----------+----------+--------------+ FV DistalNone           No       No                   Chronic        +---------+---------------+---------+-----------+----------+--------------+ PFV      Full                                                        +---------+---------------+---------+-----------+----------+--------------+ POP      Partial        Yes      Yes                  Chronic        +---------+---------------+---------+-----------+----------+--------------+ PTV      Partial                                      Chronic        +---------+---------------+---------+-----------+----------+--------------+  PERO     Full                                                        +---------+---------------+---------+-----------+----------+--------------+     Summary: BILATERAL: -No evidence of popliteal cyst, bilaterally. RIGHT: - Findings consistent with 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  LEFT: - Findings consistent with chronic deep vein thrombosis involving the left femoral vein, left popliteal vein, and left posterior tibial veins.  *See table(s) above for measurements and observations. Electronically signed by Ruta Hinds MD on 10/30/2020 at 2:34:11 PM.    Final

## 2020-10-31 NOTE — TOC Initial Note (Addendum)
Transition of Care [facility]) - Initial/Assessment Note    Patient Details  Name: Shane [name] MRN: [id number] Date of Birth: May 13, 1946  Transition of Care [facility]) CM/SW Contact:    [name], LCSW Phone Number: 10/31/2020, 2:34 PM  Clinical Narrative:                 CSW spoke with patient regarding discharge plan. He has resided at [facility] since March and is aware that he is able to remain there under Medicaid pending status since his [facility] stay is in copay status (which other SNFs are not able to accept). He provided permission for CSW to reach out to his brother to make him aware as well.   CSW spoke with patient's brother, Shane [name]. He reported frustration with [facility] not calling 911 when patient experienced issues with his foley catheter. He was hopeful that patient could go to a different facility but understands patient's Medicaid is not in place yet. He will continue to look out for the patient's care.  Expected Discharge Plan: Skilled Nursing Facility Barriers to Discharge: Continued Medical Work up   Patient Goals and CMS Choice Patient states their goals for this hospitalization and ongoing recovery are:: Feel better CMS Medicare.gov Compare Post Acute Care list provided to:: Patient Choice offered to / list presented to : Patient  Expected Discharge Plan and Services Expected Discharge Plan: [place] In-house Referral: Clinical Social Work   Post Acute Care Choice: [place] Living arrangements for the past 2 months: [place]                                      Prior Living Arrangements/Services Living arrangements for the past 2 months: [place] Lives with:: Facility Resident Patient language and need for interpreter reviewed:: Yes Do you feel safe going back to the place where you live?: Yes      Need for Family Participation in Patient Care: Yes (Comment) Care giver  support system in place?: Yes (comment)   Criminal Activity/Legal Involvement Pertinent to Current Situation/Hospitalization: No - Comment as needed  Activities of Daily Living      Permission Sought/Granted Permission sought to share information with : Facility Contact Representative,Family Supports Permission granted to share information with : Yes, Verbal Permission Granted  Share Information with NAME: Shane [name]  Permission granted to share info w AGENCY: [place] granted to share info w Relationship: Brother  Permission granted to share info w Contact Information: [phone]  Emotional Assessment Appearance:: Appears stated age Attitude/Demeanor/Rapport:  (Appropriate, quiet) Affect (typically observed): Quiet,Accepting,Appropriate Orientation: : Oriented to Self,Oriented to Place,Oriented to  Time,Oriented to Situation Alcohol / Substance Use: Not Applicable Psych Involvement: No (comment)  Admission diagnosis:  Septic shock ([place]) [A41.9, R65.21] Sepsis with acute renal failure and septic shock, due to unspecified organism, unspecified acute renal failure type ([place]) [A41.9, R65.21, N17.9] Patient Active Problem List   Diagnosis Date Noted  . Gastritis and gastroduodenitis   . Gastric ulcer with hemorrhage   . Adenomatous duodenal polyp   . Hiatal hernia   . Heme positive stool   . Acute blood loss anemia   . Sepsis with acute renal failure and septic shock ([place]) 10/26/2020  . DVT (deep venous thrombosis) ([place]) 09/08/2020  . Bilateral leg pain 09/05/2020  . 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 Poor tolerance for ambulation 09/05/2020  . Effusion of right knee 09/05/2020  . Osteoarthritis of right knee 09/05/2020  . Essential hypertension 09/05/2020  . Noncompliance 09/05/2020  . Homelessness 09/05/2020  . Acquired thrombophilia  ([place]) 09/05/2020  . Poor personal hygiene 06/18/2020  . Stage 3a chronic kidney disease ([place]) 06/18/2020  . Diet-controlled diabetes mellitus ([place]) 06/18/2020  . Class 2 obesity due to excess calories with body mass index (BMI) of 38.0 to 38.9 in adult 06/18/2020  . Back pain 06/18/2020  . Leg swelling 06/04/2020  . Educated about COVID-19 virus infection 02/03/2020  . Precordial chest pain 02/03/2020  . ACS (acute coronary syndrome) ([place]) 12/26/2019  . AKI (acute kidney injury) ([place]) 12/26/2019  . Moderate persistent asthma 12/25/2019  . Right femoral vein DVT ([place]) 03/01/2019  . Bradycardia 07/30/2018  . Sleep apnea 07/30/2018  . Murmur 04/30/2018  . Dizziness 04/30/2018  . Palpitations 04/06/2016  . Elevated blood pressure reading without diagnosis of hypertension 01/12/2016  . Sinusitis, chronic 12/19/2015  . Pain in joint, upper arm 11/23/2015  . Numbness and tingling in left arm 11/23/2015  . Asthmatic bronchitis 02/21/2015  . PTSD (post-traumatic stress disorder) 02/21/2015  . Hemorrhoids 06/15/2014  . Microcytic anemia 03/31/2008  . GERD 03/31/2008  . Impaired fasting glucose 03/31/2008   PCP:  Pcp, No Pharmacy:   [id number] - [place], [place] [address] [place] [address] Phone: [phone] Fax: [phone]  [facility] [address] [place] [address] Phone: [phone] Fax: [phone]     Social Determinants of Health (SDOH) Interventions    Readmission Risk Interventions No flowsheet data found.

## 2020-10-31 NOTE — NC FL2 (Signed)
[place] LEVEL OF CARE SCREENING TOOL     IDENTIFICATION  Patient Name: Shane [name] Birthdate: 23-Apr-1946 Sex: male Admission Date (Current Location): 10/26/2020  [facility] and [place] Number:  Herbalist and Address:  [facility], [place] [address], [place], [place] [address]      Provider Number: [id number]  Attending Physician Name and Address:  Thurnell Lose, MD  Relative Name and Phone Number:  [name], brother, [phone]    Current Level of Care: Hospital Recommended Level of Care: [place] Prior Approval Number:    Date Approved/Denied:   PASRR Number: [id number]  Discharge Plan: SNF    Current Diagnoses: Patient Active Problem List   Diagnosis Date Noted  . Gastritis and gastroduodenitis   . Gastric ulcer with hemorrhage   . Adenomatous duodenal polyp   . Hiatal hernia   . Heme positive stool   . Acute blood loss anemia   . Sepsis with acute renal failure and septic shock ([place]) 10/26/2020  . DVT (deep venous thrombosis) ([place]) 09/08/2020  . Bilateral leg pain 09/05/2020  . 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 Poor tolerance for ambulation 09/05/2020  . Effusion of right knee 09/05/2020  . Osteoarthritis of right knee 09/05/2020  . Essential hypertension 09/05/2020  . Noncompliance 09/05/2020  . Homelessness 09/05/2020  . Acquired thrombophilia ([place]) 09/05/2020  . Poor personal hygiene 06/18/2020  . Stage 3a chronic kidney disease ([place]) 06/18/2020  . Diet-controlled diabetes mellitus ([place]) 06/18/2020  . Class 2 obesity due to excess calories with body mass index (BMI) of 38.0 to 38.9 in adult 06/18/2020  . Back pain 06/18/2020  . Leg swelling 06/04/2020  . Educated about COVID-19 virus infection 02/03/2020  . Precordial chest pain 02/03/2020  . ACS  (acute coronary syndrome) ([place]) 12/26/2019  . AKI (acute kidney injury) ([place]) 12/26/2019  . Moderate persistent asthma 12/25/2019  . Right femoral vein DVT ([place]) 03/01/2019  . Bradycardia 07/30/2018  . Sleep apnea 07/30/2018  . Murmur 04/30/2018  . Dizziness 04/30/2018  . Palpitations 04/06/2016  . Elevated blood pressure reading without diagnosis of hypertension 01/12/2016  . Sinusitis, chronic 12/19/2015  . Pain in joint, upper arm 11/23/2015  . Numbness and tingling in left arm 11/23/2015  . Asthmatic bronchitis 02/21/2015  . PTSD (post-traumatic stress disorder) 02/21/2015  . Hemorrhoids 06/15/2014  . Microcytic anemia 03/31/2008  . GERD 03/31/2008  . Impaired fasting glucose 03/31/2008    Orientation RESPIRATION BLADDER Height & Weight     Self,Time,Situation,Place  Normal Incontinent,Indwelling catheter Weight: 268 lb 11.9 oz (121.9 kg) Height:  _0  (188 cm)  BEHAVIORAL SYMPTOMS/MOOD NEUROLOGICAL BOWEL NUTRITION STATUS      Incontinent (rectal tube) Diet (Please see DC Summary)  AMBULATORY STATUS COMMUNICATION OF NEEDS Skin   Extensive Assist Verbally Normal                       Personal Care Assistance Level of Assistance  Bathing,Feeding,Dressing Bathing Assistance: Maximum assistance Feeding assistance: Limited assistance Dressing Assistance: Limited assistance     Functional Limitations Info             SPECIAL CARE FACTORS FREQUENCY  PT (By licensed PT),OT (By licensed OT)     PT Frequency: 5x/week OT Frequency: 2x/week            Contractures Contractures Info:  Not present    Additional Factors Info  Code Status,Allergies Code Status Info: DNR Allergies Info: NKA           Current Medications (10/31/2020):  This is the current hospital active medication list Current Facility-Administered Medications  Medication Dose Route Frequency Provider Last Rate Last Admin  . acetaminophen (TYLENOL) tablet 650 mg  650 mg Oral Q6H PRN Anders Simmonds, MD   650 mg at 10/29/20 1650  . cephALEXin (KEFLEX) capsule 500 mg  500 mg Oral Q6H Thurnell Lose, MD   500 mg at 10/31/20 1152  . Chlorhexidine Gluconate Cloth 2 % PADS 6 each  6 each Topical Q0600 Deland Pretty, MD   6 each at 10/31/20 0630  . docusate sodium (COLACE) capsule 100 mg  100 mg Oral BID PRN Deland Pretty, MD      . ferrous sulfate tablet 325 mg  325 mg Oral TID WC Thurnell Lose, MD   325 mg at 10/30/20 1721  . heparin injection 5,000 Units  5,000 Units Subcutaneous Q8H Thurnell Lose, MD   5,000 Units at 10/31/20 1156  . metoprolol tartrate (LOPRESSOR) tablet 25 mg  25 mg Oral BID Thurnell Lose, MD   25 mg at 10/31/20 1152  . ondansetron (ZOFRAN) injection 4 mg  4 mg Intravenous Q6H PRN Deland Pretty, MD      . pantoprazole (PROTONIX) injection 40 mg  40 mg Intravenous Q12H Thurnell Lose, MD   40 mg at 10/31/20 1154  . peg 3350 powder (MOVIPREP) kit 100 g  0.5 kit Oral Once [place], [place] Q, RPH-CPP      . polyethylene glycol (MIRALAX / GLYCOLAX) packet 17 g  17 g Oral Daily PRN Deland Pretty, MD      . rosuvastatin (CRESTOR) tablet 10 mg  10 mg Oral Daily Thurnell Lose, MD   10 mg at 10/31/20 1152     Discharge Medications: Please see discharge summary for a list of discharge medications.  Relevant Imaging Results:  Relevant Lab Results:   Additional Information SSN [id number]     [place] COVID-19 Vaccine 04/10/2020 , 09/28/2019 , 08/29/2019  [name], LCSW

## 2020-11-01 ENCOUNTER — Encounter (HOSPITAL_COMMUNITY): Payer: Self-pay | Admitting: Gastroenterology

## 2020-11-01 DIAGNOSIS — K2970 Gastritis, unspecified, without bleeding: Secondary | ICD-10-CM

## 2020-11-01 DIAGNOSIS — D62 Acute posthemorrhagic anemia: Secondary | ICD-10-CM

## 2020-11-01 DIAGNOSIS — N179 Acute kidney failure, unspecified: Secondary | ICD-10-CM

## 2020-11-01 DIAGNOSIS — K2990 Gastroduodenitis, unspecified, without bleeding: Secondary | ICD-10-CM

## 2020-11-01 LAB — COMPREHENSIVE METABOLIC PANEL
ALT: 16 U/L (ref 0–44)
AST: 23 U/L (ref 15–41)
Albumin: 2 g/dL — ABNORMAL LOW (ref 3.5–5.0)
Alkaline Phosphatase: 167 U/L — ABNORMAL HIGH (ref 38–126)
Anion gap: 7 (ref 5–15)
BUN: 11 mg/dL (ref 8–23)
CO2: 22 mmol/L (ref 22–32)
Calcium: 8.4 mg/dL — ABNORMAL LOW (ref 8.9–10.3)
Chloride: 112 mmol/L — ABNORMAL HIGH (ref 98–111)
Creatinine, Ser: 0.96 mg/dL (ref 0.61–1.24)
GFR, Estimated: >60 mL/min (ref >60)
Glucose, Bld: 115 mg/dL — ABNORMAL HIGH (ref 70–99)
Potassium: 3.6 mmol/L (ref 3.5–5.1)
Sodium: 141 mmol/L (ref 135–145)
Total Bilirubin: 0.5 mg/dL (ref 0.3–1.2)
Total Protein: 5.7 g/dL — ABNORMAL LOW (ref 6.5–8.1)

## 2020-11-01 LAB — BRAIN NATRIURETIC PEPTIDE: B Natriuretic Peptide: 259.1 pg/mL — ABNORMAL HIGH (ref 0.0–100.0)

## 2020-11-01 LAB — CBC WITH DIFFERENTIAL/PLATELET
Abs Immature Granulocytes: 0.58 10*3/uL — ABNORMAL HIGH (ref 0.00–0.07)
Basophils Absolute: 0 10*3/uL (ref 0.0–0.1)
Basophils Relative: 0 %
Eosinophils Absolute: 0.2 10*3/uL (ref 0.0–0.5)
Eosinophils Relative: 2 %
HCT: 31.4 % — ABNORMAL LOW (ref 39.0–52.0)
Hemoglobin: 10.2 g/dL — ABNORMAL LOW (ref 13.0–17.0)
Immature Granulocytes: 5 %
Lymphocytes Relative: 19 %
Lymphs Abs: 2.2 10*3/uL (ref 0.7–4.0)
MCH: 20.1 pg — ABNORMAL LOW (ref 26.0–34.0)
MCHC: 32.5 g/dL (ref 30.0–36.0)
MCV: 61.9 fL — ABNORMAL LOW (ref 80.0–100.0)
Monocytes Absolute: 1.5 10*3/uL — ABNORMAL HIGH (ref 0.1–1.0)
Monocytes Relative: 13 %
Neutro Abs: 6.8 10*3/uL (ref 1.7–7.7)
Neutrophils Relative %: 61 %
Platelets: 185 10*3/uL (ref 150–400)
RBC: 5.07 MIL/uL (ref 4.22–5.81)
RDW: 22.5 % — ABNORMAL HIGH (ref 11.5–15.5)
WBC: 11.4 10*3/uL — ABNORMAL HIGH (ref 4.0–10.5)
nRBC: 0.5 % — ABNORMAL HIGH (ref 0.0–0.2)

## 2020-11-01 LAB — MAGNESIUM: Magnesium: 1.6 mg/dL — ABNORMAL LOW (ref 1.7–2.4)

## 2020-11-01 LAB — C-REACTIVE PROTEIN: CRP: 10.6 mg/dL — ABNORMAL HIGH (ref <1.0)

## 2020-11-01 LAB — SURGICAL PATHOLOGY

## 2020-11-01 MED ORDER — PANTOPRAZOLE SODIUM 40 MG PO TBEC
40.0000 mg | DELAYED_RELEASE_TABLET | Freq: Two times a day (BID) | ORAL | Status: DC
  Administered 2020-11-01 – 2020-11-02 (×2): 40 mg via ORAL
  Filled 2020-11-01 (×2): qty 1

## 2020-11-01 MED ORDER — MAGNESIUM SULFATE 2 GM/50ML IV SOLN
2.0000 g | Freq: Once | INTRAVENOUS | Status: AC
  Administered 2020-11-01: 2 g via INTRAVENOUS
  Filled 2020-11-01: qty 50

## 2020-11-01 MED ORDER — HYDROCODONE-ACETAMINOPHEN 5-325 MG PO TABS
1.0000 | ORAL_TABLET | Freq: Four times a day (QID) | ORAL | Status: DC | PRN
  Administered 2020-11-01 (×2): 2 via ORAL
  Administered 2020-11-01: 1 via ORAL
  Filled 2020-11-01 (×2): qty 2
  Filled 2020-11-01: qty 1

## 2020-11-01 NOTE — Progress Notes (Signed)
Biopsy results from yesterday's upper endoscopy reviewed and notable for the following: - Benign duodenal hyperplastic polyp - Benign peptic duodenitis without evidence of metaplasia or H. pylori infection - Biopsies from the stomach with inflammation, again no H. pylori infection.  Continue high-dose PPI as prescribed and to follow-up in the GI clinic with repeat upper endoscopy in 6-8 weeks to document appropriate mucosal healing.  This will be arranged through our clinic.

## 2020-11-01 NOTE — Progress Notes (Signed)
PHARMACIST - PHYSICIAN COMMUNICATION ° °DR:   Elgergawy ° °CONCERNING: IV to Oral Route Change Policy ° °RECOMMENDATION: °This patient is receiving Protonix by the intravenous route.  Based on criteria approved by the Pharmacy and Therapeutics Committee, the intravenous medication(s) is/are being converted to the equivalent oral dose form(s). ° ° °DESCRIPTION: °These criteria include: °The patient is eating (either orally or via tube) and/or has been taking other orally administered medications for a least 24 hours °The patient has no evidence of active gastrointestinal bleeding or impaired GI absorption (gastrectomy, short bowel, patient on TNA or NPO). ° °If you have questions about this conversion, please contact the Pharmacy Department  °[]  ( [phone] )  [place] °[]  ( [phone] )  [facility] °[x]  ( [phone] )  [place] °[]  ( [phone] )  [facility] °[]  ( [phone] )  [facility]  ° ° ° °

## 2020-11-01 NOTE — Progress Notes (Signed)
Occupational Therapy Treatment Patient Details Name: Shane [name] MRN: [id number] DOB: 1946/03/09 Today's Date: 11/01/2020    History of present illness This is a 75 year old black male that presented from a skilled nursing facility.  Very limited history is available of recent events.  The patient was sent to the emergency room because he was less responsive than normal and that he had hematuria after having a Foley catheter replaced yesterday.Found to have septic shock due to E. coli UTI .  Acute renal insufficiency  Intravascular volume depletion  Metabolic encephalopathy,Microcytic anemia with Hemoccult positive stool   OT comments  Pt making slow progress with functional goals. Max A to sit EOB with c/o B knee/LE pain with bed mobility. Session focused on sitting EOB for functional tasks. Pt sat EOB x 7 minutes for simple grooming, UB dressing and sitting tolerance. pt c/o of B knee/LE pain througout session. Total A to return to supine. OT will continue to follow acutely to maximize level of function and safety  Follow Up Recommendations  SNF;Supervision/Assistance - 24 hour    Equipment Recommendations  None recommended by OT    Recommendations for Other Services      Precautions / Restrictions Precautions Precautions: Fall Restrictions Weight Bearing Restrictions: No       Mobility Bed Mobility Overal bed mobility: Needs Assistance Bed Mobility: Supine to Sit;Sit to Supine     Supine to sit: Max assist;HOB elevated Sit to supine: Total assist   General bed mobility comments: max A with LEs to EOB and for trunk elevation, cues tro use rail, increased time and effort required. total A with LEs back onto bed and for positioning to [place] transfer comment: unable    Balance Overall balance assessment: Needs assistance Sitting-balance support: Feet supported;Bilateral upper extremity supported Sitting balance-Leahy Scale:  Fair Sitting balance - Comments: min A for balance, pt sat EOB x 7 minutes                                   ADL either performed or assessed with clinical judgement   ADL Overall ADL's : Needs assistance/impaired     Grooming: Wash/dry hands;Wash/dry face;Min guard;Sitting           Upper Body Dressing : Maximal assistance;Sitting Upper Body Dressing Details (indicate cue type and reason): donned clean gown                   General ADL Comments: pt sat EOB x 7 minutes for simple grooming, UB dressing and sitting tolerance. pt c/o of B knee/LE pain througout session     Vision Patient Visual Report: No change from baseline     Perception     Praxis      Cognition Arousal/Alertness: Awake/alert Behavior During Therapy: Flat affect Overall Cognitive Status: Impaired/Different from baseline Area of Impairment: Attention;Memory;Following commands;Awareness;Safety/judgement;Problem solving                     Memory: Decreased short-term memory Following Commands: Follows one step commands inconsistently;Follows one step commands with increased time Safety/Judgement: Decreased awareness of safety   Problem Solving: Difficulty sequencing;Requires verbal cues;Requires tactile cues          Exercises     Shoulder Instructions       General Comments  Pertinent Vitals/ Pain       Pain Assessment: Faces Faces Pain Scale: Hurts even more Pain Location: LLE with movement (from knee to foot) Pain Descriptors / Indicators: Aching;Sore Pain Intervention(s): Limited activity within patient's tolerance;Monitored during session;Repositioned  Home Living                                          Prior Functioning/Environment              Frequency  Min 2X/week        Progress Toward Goals  OT Goals(current goals can now be found in the care plan section)  Progress towards OT goals: Progressing toward  goals     Plan Discharge plan remains appropriate    Co-evaluation                 AM-PAC OT "6 Clicks" Daily Activity     Outcome Measure   Help from another person eating meals?: A Little Help from another person taking care of personal grooming?: A Little Help from another person toileting, which includes using toliet, bedpan, or urinal?: Total Help from another person bathing (including washing, rinsing, drying)?: A Lot Help from another person to put on and taking off regular upper body clothing?: A Lot Help from another person to put on and taking off regular lower body clothing?: Total 6 Click Score: 12    End of Session    OT Visit Diagnosis: Other abnormalities of gait and mobility (R26.89);Muscle weakness (generalized) (M62.81);Pain Pain - part of body: Leg;Knee (B knees/LEs)   Activity Tolerance Patient limited by pain;Patient limited by fatigue   Patient Left in bed;with call bell/phone within reach;with bed alarm set   Nurse Communication          Time: 3419-6222 OT Time Calculation (min): 14 min  Charges: OT General Charges $OT Visit: 1 Visit OT Treatments $Self Care/Home Management : 8-22 mins     Britt Bottom 11/01/2020, 2:28 PM

## 2020-11-01 NOTE — TOC Progression Note (Addendum)
Transition of Care [facility]) - Progression Note    Patient Details  Name: Shane [name] MRN: [id number] Date of Birth: [birth year]  Transition of Care [facility]) CM/SW [place], LCSW Phone Number: 11/01/2020, 9:03 AM  Clinical Narrative:    9am-Though [facility] will accept patient back Medicaid pending, CSW submitted for insurance authorization in case patient can get more rehab at SNF as well as an ambulance authorization.   4pm-CSW received PTAR approval from [facility] effective for 90 days: #[id number]. SNF authorization has been denied. Peer to peer offered; MD agreed with denial. [facility] will still accept patient back.    Expected Discharge Plan: [place] Barriers to Discharge: Continued Medical Work up  Expected Discharge Plan and Services Expected Discharge Plan: [place] In-house Referral: Clinical Social Work   Post Acute Care Choice: [place] Living arrangements for the past 2 months: [place]                                       Social Determinants of Health (SDOH) Interventions    Readmission Risk Interventions No flowsheet data found.

## 2020-11-01 NOTE — Progress Notes (Signed)
PROGRESS NOTE                                                                                                                                                                                                             Patient Demographics:    Frandy Basnett, is a 75 y.o. male, DOB - 1946-05-21, WRU:[id number]  Outpatient Primary MD for the patient is Pcp, No    LOS - 6  Admit date - 10/26/2020    Chief Complaint  Patient presents with  . Hematuria       Brief Narrative (HPI from H&P)  - This is a 75 year old black male that presented from a skilled nursing facility.  Very limited history is available of recent events.  The patient was sent to the emergency room because he was less responsive than normal and that he had hematuria after having a Foley catheter replaced on 10/25/2020, patient was diagnosed with sepsis due to E. coli UTI, initially stabilized in ICU then transferred to hospitalist service on 10/29/2017.   Subjective:   Patient in bed, patient denies any fever, chills, no chest pain, no abdominal pain, no focal deficits, he does report generalized weakness and fatigue.    Assessment  & Plan :     Septic shock E. coli UTI and Bacteremia in the presence of chronic indwelling Foley catheter which was switched at SNF on 10/25/2020.   - Sepsis pathophysiology has also been, post IVF, he received Rocephin and for likely 5 days of oral antibiotics, procalcitonin trending down appears nontoxic.  Of note foley catheter was stitched again before his hospitalization at SNF.  AKI caused by hypoperfusion due to septic shock.   - Improving after hydration.  Chronic indwelling Foley catheter.  Replaced on 10/25/2020 at SNF.  Outpatient follow-up with urology.  Elevation of troponin.  Likely due to demand mismatch caused by hypotension in the presence of septic shock, EF has dropped a little bit as compared to previous echo,  he has been evaluated by cardiology who recommends outpatient cardiology follow-up and a repeat echocardiogram likely in the next few weeks.  He is chest pain-free will place him on low-dose on Lopressor and Statin (BP soft).   Microcytic anemia in the presence of ongoing Eliquis use.  IV PPI, stable anemia panel. Patient was Hemoccult positive on 10/27/2019, post 2  units of packed RBC transfusion on 10/29/2020 with stable posttransfusion H&H, seen by GI and underwent EGD 07/23/2020 showing large polyp in duodenum- removed and clip closed to prevent bleeding. Active bleeding ulcer in stomach- clip close. Mild gastritis/duodenitis- biopsied - stable ON PPI,previous MD  [facility] Dr. Clarisa Fling, commence Eliquis on 11/03/20, outpt [facility] follow-up 2 to 3 weeks post discharge.  H/O DVTs.  As in #5 above Eliquis held currently on prophylactic dose heparin, leg ultrasound showing bilateral chronic DVTs, resume Eliquis 11/03/2020.  Metabolic encephalopathy.  Due to sepsis, improving.  No focal deficits, may require placement for deconditioning and weakness.  Hypomagnesemia -Repleted.  Patient with long toenails, will request SNF to refer to podiatry to trim toenails.      Condition - Extremely Guarded  Family Communication  : None Present  Code Status :  DNR  Consults  :  PCCM, Cards, GI  PUD Prophylaxis : PPI   Procedures  :       EGD - large polyp in duodenum- removed and clip closed to prevent bleeding. Active bleeding ulcer in stomach- clip close. Mild gastritis/duodenitis- biopsied    Leg [place] -   RIGHT: - Findings consistent with 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    LEFT: - Findings consistent with chronic deep vein thrombosis involving the left femoral vein, left popliteal vein, and left posterior tibial veins.    TTE - 1. Inferior and inferoseptal hypokinesis. Left ventricular ejection fraction, by  estimation, is 50 to 55%. The left ventricle has low normal function. The left ventricle demonstrates regional wall motion abnormalities (see scoring diagram/findings for description). There is moderate concentric left ventricular hypertrophy. Left ventricular diastolic parameters are consistent with Grade I diastolic dysfunction (impaired relaxation).  2. Right ventricular systolic function is normal. The right ventricular size is normal.  3. The mitral valve is normal in structure. No evidence of mitral valve regurgitation. No evidence of mitral stenosis.  4. The aortic valve is tricuspid. Aortic valve regurgitation is not visualized. No aortic stenosis is present.  5. The inferior vena cava is normal in size with greater than 50% respiratory variability, suggesting right atrial pressure of 3 mmHg.       Disposition Plan  :    Status is: Inpatient  Remains inpatient appropriate because:IV treatments appropriate due to intensity of illness or inability to take PO   Dispo: The patient is from: SNF              Anticipated d/c is to: SNF              Patient currently is not medically stable to d/c.   Difficult to place patient No  DVT Prophylaxis  :    heparin injection 5,000 Units Start: 10/30/20 0745 SCDs Start: 10/26/20 2320    Lab Results  Component Value Date   PLT 185 11/01/2020    Diet :  Diet Order            DIET SOFT Room service appropriate? Yes; Fluid consistency: Thin  Diet effective now                  Inpatient Medications  Scheduled Meds: . cephALEXin  500 mg Oral Q6H  . Chlorhexidine Gluconate Cloth  6 each Topical Q0600  . ferrous sulfate  325 mg Oral TID WC  . heparin injection (subcutaneous)  5,000 Units Subcutaneous Q8H  . metoprolol tartrate  25 mg Oral BID  . pantoprazole  40 mg Oral BID  . peg 3350 powder  0.5 kit Oral Once  . rosuvastatin  10 mg Oral Daily   Continuous Infusions:  PRN Meds:.acetaminophen, docusate sodium,  HYDROcodone-acetaminophen, ondansetron (ZOFRAN) IV, polyethylene glycol     Phillips Climes M.D on 11/01/2020 at 2:48 PM  To page go to [email or web address]   [facility] -  Office  [phone]    See all Orders from today for further details    Objective:   Vitals:   11/01/20 0416 11/01/20 0732 11/01/20 0917 11/01/20 1230  BP: 111/68 108/61 127/74 112/69  Pulse: 67 72 74 61  Resp: 20 19  19   Temp: 98 F (36.7 C) 98.4 F (36.9 C)  98.2 F (36.8 C)  TempSrc: Axillary Axillary  Axillary  SpO2: 96% 97%  97%  Weight:      Height:        Wt Readings from Last 3 Encounters:  10/30/20 121.9 kg  09/05/20 122.5 kg  06/21/20 128.7 kg     Intake/Output Summary (Last 24 hours) at 11/01/2020 1448 Last data filed at 11/01/2020 1232 Gross per 24 hour  Intake 240 ml  Output 600 ml  Net -360 ml     Physical Exam:  Awake Alert, Oriented X 3, No new F.N deficits, Normal affect Symmetrical Chest wall movement, Good air movement bilaterally, CTAB RRR,No Gallops,Rubs or new Murmurs, No Parasternal Heave +ve B.Sounds, Abd Soft, No tenderness, No rebound - guarding or rigidity. No Cyanosis, Clubbing or edema, long toenail  Foley in  Place.    Data Review:    CBC Recent Labs  Lab 10/26/20 1807 10/26/20 2353 10/28/20 0042 10/29/20 0031 10/29/20 2207 10/30/20 0104 10/31/20 0312 11/01/20 0120  WBC 1.4*   < > 26.1* 21.7*  --  18.0* 13.9* 11.4*  HGB 11.7*   < > 8.8* 7.2* 9.3* 9.2* 10.5* 10.2*  HCT 38.1*   < > 26.7* 22.0* 28.3* 27.5* 32.2* 31.4*  PLT 206   < > 169 129*  --  123* 158 185  MCV 61.2*   < > 58.3* 57.9*  --  61.1* 61.3* 61.9*  MCH 18.8*   < > 19.2* 18.9*  --  20.4* 20.0* 20.1*  MCHC 30.7   < > 33.0 32.7  --  33.5 32.6 32.5  RDW 17.8*   < > 16.5* 16.1*  --  21.8* 22.0* 22.5*  LYMPHSABS 0.5*  --   --   --   --  1.9 2.2 2.2  MONOABS 0.0*  --   --   --   --  0.9 1.2* 1.5*  EOSABS 0.0  --   --   --   --  0.3 0.3 0.2  BASOSABS 0.0  --   --   --   --  0.1 0.0 0.0    < > = values in this interval not displayed.    Recent Labs  Lab 10/26/20 1807 10/26/20 1808 10/26/20 2009 10/26/20 2353 10/27/20 0118 10/27/20 1255 10/28/20 0042 10/29/20 0031 10/29/20 0747 10/30/20 0104 10/31/20 0312 11/01/20 0120  NA 135  --   --    < > 139 134* 136 135  --  139 139 141  K 3.0*  --   --    < > 2.6* 3.7 4.0 3.5  --  3.2* 3.2* 3.6  CL 99  --   --   --  101 101 101 103  --  106 109 112*  CO2 21*  --   --   --  18* 18* 20* 21*  --  25 22 22   GLUCOSE 127*  --   --   --  87 167* 139* 79  --  76 85 115*  BUN 11  --   --   --  18 18 18 18   --  15 14 11   CREATININE 1.74*  --   --   --  2.17* 1.87* 1.53* 1.17  --  1.02 0.97 0.96  CALCIUM 8.8*  --   --   --  8.5* 7.7* 7.5* 7.6*  --  8.0* 8.3* 8.4*  AST 23  --   --   --   --   --   --   --   --  43* 31 23  ALT 10  --   --   --   --   --   --   --   --  16 16 16   ALKPHOS 147*  --   --   --   --   --   --   --   --  148* 191* 167*  BILITOT 1.7*  --   --   --   --   --   --   --   --  0.6 0.7 0.5  ALBUMIN 2.9*  --   --   --   --   --   --   --   --  1.8* 2.0* 2.0*  MG  --   --   --    < > 1.2* 1.5*  --   --  1.8 1.7 1.5* 1.6*  CRP  --   --   --   --   --   --   --   --  20.5* 14.3* 9.0* 10.6*  DDIMER  --   --   --   --   --   --   --   --  3.29*  --   --   --   PROCALCITON 1.18  --   --   --  >150.00  --  146.62  --  66.81 48.33 23.13  --   LATICACIDVEN  --  5.1* 8.1*  --   --   --   --   --   --   --   --   --   INR 2.1*  --   --   --   --   --   --   --   --   --   --   --   TSH  --   --   --   --   --   --   --   --  4.748*  --   --   --   BNP  --   --   --   --   --   --   --   --  681.7* 593.4* 548.1* 259.1*   < > = values in this interval not displayed.    ------------------------------------------------------------------------------------------------------------------ No results for input(s): CHOL, HDL, LDLCALC, TRIG, CHOLHDL, LDLDIRECT in the last 72 hours.  Lab Results  Component Value Date   HGBA1C 5.8  (H) 06/18/2020   ------------------------------------------------------------------------------------------------------------------ No results for input(s): TSH, T4TOTAL, T3FREE, THYROIDAB in the last 72 hours.  Invalid input(s): FREET3  Cardiac Enzymes No results for input(s): CKMB, TROPONINI, MYOGLOBIN in the last 168 hours.  Invalid input(s): CK ------------------------------------------------------------------------------------------------------------------  Component Value Date/Time   BNP 259.1 (H) 11/01/2020 0120    Radiology Reports DG Chest Port 1 View  Result Date: 10/26/2020 CLINICAL DATA:  Blood in urine back. Eliquis for history of DVT. Unresponsive, tachycardic, and hypertensive. EXAM: PORTABLE CHEST 1 VIEW COMPARISON:  09/22/2020 FINDINGS: Shallow inspiration. Heart size and pulmonary vascularity are normal for inspiratory effort. No focal consolidation or airspace disease in the lungs. No pleural effusions. No pneumothorax. Mediastinal contours appear intact. Degenerative changes in the spine. IMPRESSION: No active disease. Electronically Signed   By: Lucienne Capers M.D.   On: 10/26/2020 19:19   ECHOCARDIOGRAM COMPLETE  Result Date: 10/27/2020    ECHOCARDIOGRAM REPORT   Patient Name:   TRACE WIRICK Date of Exam: 10/27/2020 Medical Rec #:  [id number]          Height:       74.0 in Accession #:    [id number]         Weight:       250.0 lb Date of Birth:  1946-05-03          BSA:          2.390 m Patient Age:    1 years           BP:           97/58 mmHg Patient Gender: M                  HR:           81 bpm. Exam Location:  Inpatient Procedure: 2D Echo, Cardiac Doppler and Color Doppler Indications:    CHF  History:        Patient has prior history of Echocardiogram examinations, most                 recent 06/19/2020. Risk Factors:Hypertension and Diabetes.  Sonographer:    Cammy Brochure Referring Phys: [id number] ADEWALE A Ander Slade  Sonographer Comments: Image  acquisition challenging due to patient body habitus and Image acquisition challenging due to respiratory motion. IMPRESSIONS  1. Inferior and inferoseptal hypokinesis. Left ventricular ejection fraction, by estimation, is 50 to 55%. The left ventricle has low normal function. The left ventricle demonstrates regional wall motion abnormalities (see scoring diagram/findings for description). There is moderate concentric left ventricular hypertrophy. Left ventricular diastolic parameters are consistent with Grade I diastolic dysfunction (impaired relaxation).  2. Right ventricular systolic function is normal. The right ventricular size is normal.  3. The mitral valve is normal in structure. No evidence of mitral valve regurgitation. No evidence of mitral stenosis.  4. The aortic valve is tricuspid. Aortic valve regurgitation is not visualized. No aortic stenosis is present.  5. The inferior vena cava is normal in size with greater than 50% respiratory variability, suggesting right atrial pressure of 3 mmHg. FINDINGS  Left Ventricle: Inferior and inferoseptal hypokinesis. Left ventricular ejection fraction, by estimation, is 50 to 55%. The left ventricle has low normal function. The left ventricle demonstrates regional wall motion abnormalities. The left ventricular internal cavity size was normal in size. There is moderate concentric left ventricular hypertrophy. Left ventricular diastolic parameters are consistent with Grade I diastolic dysfunction (impaired relaxation). Indeterminate filling pressures. Right Ventricle: The right ventricular size is normal. No increase in right ventricular wall thickness. Right ventricular systolic function is normal. Left Atrium: Left atrial size was normal in size. Right Atrium: Right atrial size was normal in size. Pericardium: There is no evidence of pericardial effusion. Mitral Valve:  The mitral valve is normal in structure. No evidence of mitral valve regurgitation. No evidence  of mitral valve stenosis. Tricuspid Valve: The tricuspid valve is normal in structure. Tricuspid valve regurgitation is mild . No evidence of tricuspid stenosis. Aortic Valve: The aortic valve is tricuspid. Aortic valve regurgitation is not visualized. No aortic stenosis is present. Aortic valve mean gradient measures 5.0 mmHg. Aortic valve peak gradient measures 9.6 mmHg. Aortic valve area, by VTI measures 3.42 cm. Pulmonic Valve: The pulmonic valve was normal in structure. Pulmonic valve regurgitation is trivial. No evidence of pulmonic stenosis. Aorta: The aortic root is normal in size and structure. Venous: The inferior vena cava is normal in size with greater than 50% respiratory variability, suggesting right atrial pressure of 3 mmHg. IAS/Shunts: No atrial level shunt detected by color flow Doppler.  LEFT VENTRICLE PLAX 2D LVIDd:         4.20 cm  Diastology LVIDs:         2.80 cm  LV e' medial:    6.74 cm/s LV PW:         1.40 cm  LV E/e' medial:  10.1 LV IVS:        1.60 cm  LV e' lateral:   10.60 cm/s LVOT diam:     2.30 cm  LV E/e' lateral: 6.4 LV SV:         81 LV SV Index:   34 LVOT Area:     4.15 cm  RIGHT VENTRICLE RV S prime:     11.10 cm/s TAPSE (M-mode): 2.0 cm LEFT ATRIUM             Index       RIGHT ATRIUM           Index LA diam:        3.80 cm 1.59 cm/m  RA Area:     19.00 cm LA Vol (A2C):   43.6 ml 18.25 ml/m RA Volume:   50.50 ml  21.13 ml/m LA Vol (A4C):   32.3 ml 13.52 ml/m LA Biplane Vol: 38.1 ml 15.94 ml/m  AORTIC VALVE AV Area (Vmax):    4.05 cm AV Area (Vmean):   3.63 cm AV Area (VTI):     3.42 cm AV Vmax:           155.00 cm/s AV Vmean:          103.000 cm/s AV VTI:            0.238 m AV Peak Grad:      9.6 mmHg AV Mean Grad:      5.0 mmHg LVOT Vmax:         151.00 cm/s LVOT Vmean:        90.100 cm/s LVOT VTI:          0.196 m LVOT/AV VTI ratio: 0.82  AORTA Ao Root diam: 3.40 cm Ao Asc diam:  3.60 cm MITRAL VALVE               TRICUSPID VALVE MV Area (PHT): 4.15 cm    TR  Peak grad:   15.7 mmHg MV Decel Time: 183 msec    TR Vmax:        198.00 cm/s MV E velocity: 67.90 cm/s MV A velocity: 61.10 cm/s  SHUNTS MV E/A ratio:  1.11        Systemic VTI:  0.20 m  Systemic Diam: 2.30 cm Skeet Latch MD Electronically signed by Skeet Latch MD Signature Date/Time: 10/27/2020/4:25:51 PM    Final    VAS [place] LOWER EXTREMITY VENOUS (DVT)  Result Date: 10/30/2020  Lower Venous DVT Study Patient Name:  GRACEN SOUTHWELL  Date of Exam:   10/30/2020 Medical Rec #: [id number]           Accession #:    [id number] Date of Birth: 01/27/1946           Patient Gender: M Patient Age:   75Y Exam Location:  [facility] Procedure:      VAS [place] LOWER EXTREMITY VENOUS (DVT) Referring Phys: [id number] Margaree Mackintosh [facility] --------------------------------------------------------------------------------  Indications: Rapidly rising d-dimer, history of chronic DVT.  Anticoagulation: Heparin injection. Limitations: Poor ultrasound/tissue interface. Comparison Study: 09-05-2020 Most recent prior lower venous exam was positive                   for chronic DVT involving the RT femoral vein, popliteal vein,                   and posterior tibial veins. Performing Technologist: Rogelia Rohrer RDMS, RVT  Examination Guidelines: A complete evaluation includes B-mode imaging, spectral Doppler, color Doppler, and power Doppler as needed of all accessible portions of each vessel. Bilateral testing is considered an integral part of a complete examination. Limited examinations for reoccurring indications may be performed as noted. The reflux portion of the exam is performed with the patient in reverse Trendelenburg.  +---------+---------------+---------+-----------+----------+-------------------+ RIGHT    CompressibilityPhasicitySpontaneityPropertiesThrombus Aging      +---------+---------------+---------+-----------+----------+-------------------+ CFV      Full           Yes      Yes                                       +---------+---------------+---------+-----------+----------+-------------------+ SFJ      Full                                                             +---------+---------------+---------+-----------+----------+-------------------+ FV Prox  Full                                                             +---------+---------------+---------+-----------+----------+-------------------+ FV Mid   Partial        Yes      Yes                  Chronic             +---------+---------------+---------+-----------+----------+-------------------+ FV DistalPartial        Yes      Yes                  Chronic             +---------+---------------+---------+-----------+----------+-------------------+ PFV      Full                                                             +---------+---------------+---------+-----------+----------+-------------------+  POP      Partial        Yes      Yes                  Chronic             +---------+---------------+---------+-----------+----------+-------------------+ PTV      Partial                                      Chronic             +---------+---------------+---------+-----------+----------+-------------------+ PERO                                                  Not well visualized +---------+---------------+---------+-----------+----------+-------------------+   +---------+---------------+---------+-----------+----------+--------------+ LEFT     CompressibilityPhasicitySpontaneityPropertiesThrombus Aging +---------+---------------+---------+-----------+----------+--------------+ CFV      Full           Yes      Yes                                 +---------+---------------+---------+-----------+----------+--------------+ SFJ      Full                                                        +---------+---------------+---------+-----------+----------+--------------+ FV  Prox  Partial        Yes      Yes                  Chronic        +---------+---------------+---------+-----------+----------+--------------+ FV Mid   None           No       No                   Chronic        +---------+---------------+---------+-----------+----------+--------------+ FV DistalNone           No       No                   Chronic        +---------+---------------+---------+-----------+----------+--------------+ PFV      Full                                                        +---------+---------------+---------+-----------+----------+--------------+ POP      Partial        Yes      Yes                  Chronic        +---------+---------------+---------+-----------+----------+--------------+ PTV      Partial                                      Chronic        +---------+---------------+---------+-----------+----------+--------------+  PERO     Full                                                        +---------+---------------+---------+-----------+----------+--------------+     Summary: BILATERAL: -No evidence of popliteal cyst, bilaterally. RIGHT: - Findings consistent with 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  LEFT: - Findings consistent with chronic deep vein thrombosis involving the left femoral vein, left popliteal vein, and left posterior tibial veins.  *See table(s) above for measurements and observations. Electronically signed by Ruta Hinds MD on 10/30/2020 at 2:34:11 PM.    Final

## 2020-11-02 DIAGNOSIS — K250 Acute gastric ulcer with hemorrhage: Secondary | ICD-10-CM

## 2020-11-02 LAB — CBC WITH DIFFERENTIAL/PLATELET
Abs Immature Granulocytes: 0 10*3/uL (ref 0.00–0.07)
Basophils Absolute: 0 10*3/uL (ref 0.0–0.1)
Basophils Relative: 0 %
Eosinophils Absolute: 0.5 10*3/uL (ref 0.0–0.5)
Eosinophils Relative: 5 %
HCT: 30.2 % — ABNORMAL LOW (ref 39.0–52.0)
Hemoglobin: 9.6 g/dL — ABNORMAL LOW (ref 13.0–17.0)
Lymphocytes Relative: 9 %
Lymphs Abs: 0.9 10*3/uL (ref 0.7–4.0)
MCH: 19.8 pg — ABNORMAL LOW (ref 26.0–34.0)
MCHC: 31.8 g/dL (ref 30.0–36.0)
MCV: 62.3 fL — ABNORMAL LOW (ref 80.0–100.0)
Monocytes Absolute: 1.1 10*3/uL — ABNORMAL HIGH (ref 0.1–1.0)
Monocytes Relative: 10 %
Neutro Abs: 8 10*3/uL — ABNORMAL HIGH (ref 1.7–7.7)
Neutrophils Relative %: 76 %
Platelets: 192 10*3/uL (ref 150–400)
RBC: 4.85 MIL/uL (ref 4.22–5.81)
RDW: 22.7 % — ABNORMAL HIGH (ref 11.5–15.5)
WBC: 10.5 10*3/uL (ref 4.0–10.5)
nRBC: 0.2 % (ref 0.0–0.2)
nRBC: 1 /100 WBC — ABNORMAL HIGH

## 2020-11-02 LAB — COMPREHENSIVE METABOLIC PANEL
ALT: 14 U/L (ref 0–44)
AST: 18 U/L (ref 15–41)
Albumin: 1.9 g/dL — ABNORMAL LOW (ref 3.5–5.0)
Alkaline Phosphatase: 134 U/L — ABNORMAL HIGH (ref 38–126)
Anion gap: 8 (ref 5–15)
BUN: 14 mg/dL (ref 8–23)
CO2: 22 mmol/L (ref 22–32)
Calcium: 8.1 mg/dL — ABNORMAL LOW (ref 8.9–10.3)
Chloride: 108 mmol/L (ref 98–111)
Creatinine, Ser: 0.94 mg/dL (ref 0.61–1.24)
GFR, Estimated: >60 mL/min (ref >60)
Glucose, Bld: 88 mg/dL (ref 70–99)
Potassium: 3.6 mmol/L (ref 3.5–5.1)
Sodium: 138 mmol/L (ref 135–145)
Total Bilirubin: 0.8 mg/dL (ref 0.3–1.2)
Total Protein: 5.4 g/dL — ABNORMAL LOW (ref 6.5–8.1)

## 2020-11-02 LAB — PROCALCITONIN: Procalcitonin: 7.14 ng/mL

## 2020-11-02 LAB — RESP PANEL BY RT-PCR (FLU A&B, COVID) ARPGX2
Influenza A by PCR: NEGATIVE
Influenza B by PCR: NEGATIVE
SARS Coronavirus 2 by RT PCR: NEGATIVE

## 2020-11-02 LAB — MAGNESIUM: Magnesium: 1.7 mg/dL (ref 1.7–2.4)

## 2020-11-02 LAB — C-REACTIVE PROTEIN: CRP: 13.8 mg/dL — ABNORMAL HIGH (ref <1.0)

## 2020-11-02 LAB — BRAIN NATRIURETIC PEPTIDE: B Natriuretic Peptide: 243 pg/mL — ABNORMAL HIGH (ref 0.0–100.0)

## 2020-11-02 MED ORDER — FERROUS SULFATE 325 (65 FE) MG PO TABS: 325.0000 mg | ORAL_TABLET | Freq: Three times a day (TID) | ORAL | 3 refills | Status: DC

## 2020-11-02 MED ORDER — FUROSEMIDE 40 MG PO TABS: 20.0000 mg | ORAL_TABLET | ORAL | 10 refills | Status: DC

## 2020-11-02 MED ORDER — CEPHALEXIN 500 MG PO CAPS: 500.0000 mg | ORAL_CAPSULE | Freq: Four times a day (QID) | ORAL | 0 refills | Status: AC

## 2020-11-02 MED ORDER — POLYETHYLENE GLYCOL 3350 17 G PO PACK: 17.0000 g | PACK | Freq: Every day | ORAL | 0 refills | Status: AC | PRN

## 2020-11-02 MED ORDER — METOPROLOL TARTRATE 25 MG PO TABS: 25.0000 mg | ORAL_TABLET | Freq: Two times a day (BID) | ORAL | Status: DC

## 2020-11-02 MED ORDER — PANTOPRAZOLE SODIUM 40 MG PO TBEC
40.0000 mg | DELAYED_RELEASE_TABLET | Freq: Two times a day (BID) | ORAL | Status: DC
Start: 2020-11-02 — End: 2021-05-30

## 2020-11-02 MED ORDER — HYDROCODONE-ACETAMINOPHEN 5-325 MG PO TABS: 1.0000 | ORAL_TABLET | Freq: Four times a day (QID) | ORAL | 0 refills | Status: DC | PRN

## 2020-11-02 MED ORDER — DOCUSATE SODIUM 100 MG PO CAPS: 100.0000 mg | ORAL_CAPSULE | Freq: Two times a day (BID) | ORAL | 0 refills | Status: AC

## 2020-11-02 MED ORDER — APIXABAN 5 MG PO TABS: 5.0000 mg | ORAL_TABLET | Freq: Two times a day (BID) | ORAL | 0 refills | Status: AC

## 2020-11-02 MED ORDER — ROSUVASTATIN CALCIUM 10 MG PO TABS: 10.0000 mg | ORAL_TABLET | Freq: Every day | ORAL | Status: AC

## 2020-11-02 NOTE — Plan of Care (Signed)
  Problem: Clinical Measurements: Goal: Ability to maintain clinical measurements within normal limits will improve Outcome: Progressing   Problem: Clinical Measurements: Goal: Will remain free from infection Outcome: Progressing   Problem: Clinical Measurements: Goal: Respiratory complications will improve Outcome: Progressing   Problem: Pain Managment: Goal: General experience of comfort will improve Outcome: Progressing   Problem: Safety: Goal: Ability to remain free from injury will improve Outcome: Progressing   Problem: Education: Goal: Knowledge of General Education information will improve Description: Including pain rating scale, medication(s)/side effects and non-pharmacologic comfort measures Outcome: Not Progressing   Problem: Health Behavior/Discharge Planning: Goal: Ability to manage health-related needs will improve Outcome: Not Progressing   Problem: Activity: Goal: Risk for activity intolerance will decrease Outcome: Not Progressing   Problem: Nutrition: Goal: Adequate nutrition will be maintained Outcome: Not Progressing   Problem: Coping: Goal: Level of anxiety will decrease Outcome: Not Progressing   Problem: Skin Integrity: Goal: Risk for impaired skin integrity will decrease Outcome: Not Progressing

## 2020-11-02 NOTE — Discharge Instructions (Signed)
Follow with SNF MD  Get CBC, CMP, 2 view Chest X ray checked  by Primary MD next visit.    Activity: As tolerated with Full fall precautions use walker/cane & assistance as needed   Disposition SNF   Diet: Soft diet  For Heart failure patients - Check your Weight same time everyday, if you gain over 2 pounds, or you develop in leg swelling, experience more shortness of breath or chest pain, call your Primary MD immediately. Follow Cardiac Low Salt Diet and 1.5 lit/day fluid restriction.   On your next visit with your primary care physician please Get Medicines reviewed and adjusted.   Please request your Prim.MD to go over all Hospital Tests and Procedure/Radiological results at the follow up, please get all Hospital records sent to your Prim MD by signing hospital release before you go home.   If you experience worsening of your admission symptoms, develop shortness of breath, life threatening emergency, suicidal or homicidal thoughts you must seek medical attention immediately by calling 911 or calling your MD immediately  if symptoms less severe.  You Must read complete instructions/literature along with all the possible adverse reactions/side effects for all the Medicines you take and that have been prescribed to you. Take any new Medicines after you have completely understood and accpet all the possible adverse reactions/side effects.   Do not drive, operating heavy machinery, perform activities at heights, swimming or participation in water activities or provide baby sitting services if your were admitted for syncope or siezures until you have seen by Primary MD or a Neurologist and advised to do so again.  Do not drive when taking Pain medications.    Do not take more than prescribed Pain, Sleep and Anxiety Medications  Special Instructions: If you have smoked or chewed Tobacco  in the last 2 yrs please stop smoking, stop any regular Alcohol  and or any Recreational drug  use.  Wear Seat belts while driving.   Please note  You were cared for by a hospitalist during your hospital stay. If you have any questions about your discharge medications or the care you received while you were in the hospital after you are discharged, you can call the unit and asked to speak with the hospitalist on call if the hospitalist that took care of you is not available. Once you are discharged, your primary care physician will handle any further medical issues. Please note that NO REFILLS for any discharge medications will be authorized once you are discharged, as it is imperative that you return to your primary care physician (or establish a relationship with a primary care physician if you do not have one) for your aftercare needs so that they can reassess your need for medications and monitor your lab values.

## 2020-11-02 NOTE — Progress Notes (Signed)
Physical Therapy Treatment Patient Details Name: Shane [name] MRN: [id number] DOB: [birth year] Today's Date: 11/02/2020    History of Present Illness This is a 75 year old black male that presented from a skilled nursing facility.  Very limited history is available of recent events.  The patient was sent to the emergency room because he was less responsive than normal and that he had hematuria after having a Foley catheter replaced yesterday.Found to have septic shock due to E. coli UTI .  Acute renal insufficiency  Intravascular volume depletion  Metabolic encephalopathy,Microcytic anemia with Hemoccult positive stool    PT Comments    Patient received in bed, agreeable to working with PT but requiring max verbal encouragement to participate today; eventually told me he would get to EOB, but in his way. Gave him plenty of extended time to try to problem solve but ultimately he was unable- so needed MaxAx2 to get to/from EOB today. Tolerated sitting for 3-4 minutes before we returned to bed due to pain. Left in bed with all needs met, bed alarm active. Continue to recommend SNF.    Follow Up Recommendations  SNF     Equipment Recommendations  None recommended by PT    Recommendations for Other Services       Precautions / Restrictions Precautions Precautions: Fall Restrictions Weight Bearing Restrictions: No    Mobility  Bed Mobility Overal bed mobility: Needs Assistance Bed Mobility: Supine to Sit;Sit to Supine     Supine to sit: Max assist;+2 for physical assistance;HOB elevated Sit to supine: Max assist;+2 for physical assistance   General bed mobility comments: 2 person assist to get to/from EOB due to limited initation by patient; able to sit at EOB for 3-4 minutes before returning back to bed due to pain    Transfers                 General transfer comment: unable  Ambulation/Gait             General Gait Details: unable   Stairs              Wheelchair Mobility    Modified Rankin (Stroke Patients Only)       Balance Overall balance assessment: Needs assistance Sitting-balance support: Feet supported;Bilateral upper extremity supported Sitting balance-Leahy Scale: Poor Sitting balance - Comments: MaxA initially due to posterior lean, improved to min guard but still with posterior bias     Standing balance-Leahy Scale: Zero                              Cognition Arousal/Alertness: Awake/alert Behavior During Therapy: Flat affect Overall Cognitive Status: Impaired/Different from baseline Area of Impairment: Attention;Memory;Following commands;Awareness;Safety/judgement;Problem solving                   Current Attention Level: Sustained Memory: Decreased short-term memory Following Commands: Follows one step commands inconsistently;Follows one step commands with increased time Safety/Judgement: Decreased awareness of safety Awareness: Intellectual Problem Solving: Difficulty sequencing;Requires verbal cues;Requires tactile cues General Comments: heavy multimodal commands; able to tell me he needs to move in his own way but unable to problem solve through      Exercises      General Comments        Pertinent Vitals/Pain Pain Assessment: Faces Faces Pain Scale: Hurts even more Pain Location: LLE with movement (from knee to foot) Pain Descriptors / Indicators: Aching;Sore Pain Intervention(s): Limited activity within patient's  tolerance;Monitored during session    Home Living                      Prior Function            PT Goals (current goals can now be found in the care plan section) Acute Rehab PT Goals Patient Stated Goal: To lay back down once he sat up on EOB with [place] PT Goal Formulation: With patient/family Time For Goal Achievement: 11/12/20 Potential to Achieve Goals: Fair Progress towards PT goals: Progressing toward goals    Frequency    Min  2X/week      PT Plan Current plan remains appropriate    Co-evaluation              AM-PAC PT "6 Clicks" Mobility   Outcome Measure  Help needed turning from your back to your side while in a flat bed without using bedrails?: A Lot Help needed moving from lying on your back to sitting on the side of a flat bed without using bedrails?: Total Help needed moving to and from a bed to a chair (including a wheelchair)?: Total Help needed standing up from a chair using your arms (e.g., wheelchair or bedside chair)?: Total Help needed to walk in hospital room?: Total Help needed climbing 3-5 steps with a railing? : Total 6 Click Score: 7    End of Session   Activity Tolerance: Patient tolerated treatment well;Patient limited by pain Patient left: in bed;with call bell/phone within reach;with bed alarm set Nurse Communication: Mobility status PT Visit Diagnosis: Other abnormalities of gait and mobility (R26.89);Muscle weakness (generalized) (M62.81);Difficulty in walking, not elsewhere classified (R26.2)     Time: 5217-4715 PT Time Calculation (min) (ACUTE ONLY): 26 min  Charges:  $Therapeutic Activity: 23-37 mins                     Windell Norfolk, DPT, PN1   Supplemental Physical Therapist [place]    Pager [phone] Acute Rehab Office [phone]

## 2020-11-02 NOTE — Care Management Important Message (Signed)
Important Message  Patient Details  Name: GOR VESTAL MRN: [id number] Date of Birth: [birth year]   Medicare Important Message Given:  Yes - Important Message mailed due to current National Emergency   Verbal consent obtained due to current National Emergency  Relationship to patient: Self Contact Name: Izzy Call Date: 11/02/20  Time: 1213 Phone: [phone] Outcome: No Answer/Busy Important Message mailed to: Patient address on file    Delorse Lek 11/02/2020, 12:13 PM

## 2020-11-02 NOTE — Discharge Summary (Signed)
Physician Discharge Summary  Shane [name] [id number] DOB: 20-Jan-1946 DOA: 10/26/2020  PCP: Pcp, No  Admit date: 10/26/2020 Discharge date: 11/02/2020  Admitted From: SNF Disposition:  SNF  Recommendations for Outpatient Follow-up:  -Please check CBC, CMP in 3 days. -Resume Eliquis on 11/03/2020. - Patient with long toenails, will need  podiatry to trim toenails. -Patient to follow with [facility] as [name] outpatient in 6 to 8 weeks for repeat endoscopy to document mucosal healing regarding peptic ulcer disease.  Home Health:None Equipment/Devices:None  Discharge Condition:Stable CODE STATUS:DNR Diet recommendation: Heart Healthy / soft   Brief/Interim Summary:   This is a 75 year old black male that presented from a skilled nursing facility. Very limited history is available of recent events. The patient was sent to the emergency room because he was less responsive than normal and that he had hematuria after having a Foley catheter replaced on 10/25/2020, patient was diagnosed with sepsis due to E. coli UTI, initially stabilized in ICU then transferred to hospitalist service on 10/29/2017.     Septic shock E. coli UTI and Bacteremia in the presence of chronic indwelling Foley catheter which was switched at SNF on 10/25/2020.   - Sepsis presents on admission, pathophysiology has resolved , urine culture, blood culture, growing E. coli, he was treated with IV Rocephin initially, currently transitioned to Keflex, to continue until 11/05/2020, procalcitonin trending down, leukocytosis has resolved, nontoxic-appearing. - Of note foley catheter was stitched again before his hospitalization at SNF.  AKI caused by hypoperfusion due to septic shock.   - resolved with hydration  Chronic indwelling Foley catheter.   - Replaced on 10/25/2020 at SNF.  Outpatient follow-up with urology.  Elevation of troponin.  -  Likely due to demand mismatch caused by hypotension in the presence of septic  shock, EF has dropped a little bit as compared to previous echo, he has been evaluated by cardiology who recommends outpatient cardiology follow-up and a repeat echocardiogram likely in the next few weeks.  He is chest pain-free  And was placed on  on low-dose on Lopressor and Statin (BP soft).   Acute blood loss anemia due to bleeding gastric ulcer with ongoing Eliquis use.  . - treated  IV PPI, stable anemia panel. Patient was Hemoccult positive on 10/27/2019, post 2 units of packed RBC transfusion on 10/29/2020 with stable posttransfusion H&H, seen by GI and underwent EGD 07/23/2020 showing large polyp in duodenum- removed and clip closed to prevent bleeding. Active bleeding ulcer in stomach- clip close. Mild gastritis/duodenitis- biopsied - stable ON PPI. -H&H has been stable, okay to resume Eliquis on 11/03/2020. -GI input greatly appreciated, biopsy results notable for benign duodenal hyperplastic polyp, benign peptic duodenitis without evidence of metaplasia or H. pylori infection, biopsies from the stomach with inflammation, again no H. pylori infection, recommendation by GI to continue high-dose PPI, and to follow with GI clinic for repeat upper endoscopy in 6 to 8 weeks to document appropriate mucosal healing, it will be arranged through low-power GI clinic.  Hypertension - blood pressure is soft, Norvasc was stopped, now he is on metoprolol  H/O DVTs.  Eliquis has been held due to GI bleed, he was kept on subcu heparin during hospital stay, leg ultrasound showing bilateral chronic DVTs, resume Eliquis 11/03/2020.  Metabolic encephalopathy.  Due to sepsis, improving.  No focal deficits,.  Hypomagnesemia -Repleted.  Patient with long toenails, will request SNF to refer to podiatry to trim toenails.  Discharge Diagnoses:  Active Problems:   Sepsis  with acute renal failure and septic shock (HCC)   Heme positive stool   Acute blood loss anemia   Gastritis and gastroduodenitis   Gastric  ulcer with hemorrhage   Adenomatous duodenal polyp   Hiatal hernia    Discharge Instructions  Discharge Instructions    Discharge instructions   Complete by: As directed    Follow with SNF MD  Get CBC, CMP, 2 view Chest X ray checked  by Primary MD next visit.    Activity: As tolerated with Full fall precautions use walker/cane & assistance as needed   Disposition SNF   Diet: Soft diet  For Heart failure patients - Check your Weight same time everyday, if you gain over 2 pounds, or you develop in leg swelling, experience more shortness of breath or chest pain, call your Primary MD immediately. Follow Cardiac Low Salt Diet and 1.5 lit/day fluid restriction.   On your next visit with your primary care physician please Get Medicines reviewed and adjusted.   Please request your Prim.MD to go over all Hospital Tests and Procedure/Radiological results at the follow up, please get all Hospital records sent to your Prim MD by signing hospital release before you go home.   If you experience worsening of your admission symptoms, develop shortness of breath, life threatening emergency, suicidal or homicidal thoughts you must seek medical attention immediately by calling 911 or calling your MD immediately  if symptoms less severe.  You Must read complete instructions/literature along with all the possible adverse reactions/side effects for all the Medicines you take and that have been prescribed to you. Take any new Medicines after you have completely understood and accpet all the possible adverse reactions/side effects.   Do not drive, operating heavy machinery, perform activities at heights, swimming or participation in water activities or provide baby sitting services if your were admitted for syncope or siezures until you have seen by Primary MD or a Neurologist and advised to do so again.  Do not drive when taking Pain medications.    Do not take more than prescribed Pain, Sleep  and Anxiety Medications  Special Instructions: If you have smoked or chewed Tobacco  in the last 2 yrs please stop smoking, stop any regular Alcohol  and or any Recreational drug use.  Wear Seat belts while driving.   Please note  You were cared for by a hospitalist during your hospital stay. If you have any questions about your discharge medications or the care you received while you were in the hospital after you are discharged, you can call the unit and asked to speak with the hospitalist on call if the hospitalist that took care of you is not available. Once you are discharged, your primary care physician will handle any further medical issues. Please note that NO REFILLS for any discharge medications will be authorized once you are discharged, as it is imperative that you return to your primary care physician (or establish a relationship with a primary care physician if you do not have one) for your aftercare needs so that they can reassess your need for medications and monitor your lab values.   Increase activity slowly   Complete by: As directed      Allergies as of 11/02/2020   No Known Allergies     Medication List    STOP taking these medications   amLODipine 10 MG tablet Commonly known as: NORVASC   Breo Ellipta 200-25 MCG/INH Aepb Generic drug: fluticasone furoate-vilanterol   nitrofurantoin (  macrocrystal-monohydrate) 100 MG capsule Commonly known as: MACROBID     TAKE these medications   albuterol 108 (90 Base) MCG/ACT inhaler Commonly known as: VENTOLIN HFA INHALE TWO PUFFS EVERY FOUR TO SIX HOURS AS NEEDED FOR COUGH OR WHEEZE. What changed:   how much to take  how to take this  when to take this  reasons to take this  additional instructions   alum & mag hydroxide-simeth 400-400-40 MG/5ML suspension Commonly known as: Mylanta Maximum Strength Take 5 mLs by mouth every 6 (six) hours as needed (abdominal pain or indigestion).   apixaban 5 MG Tabs  tablet Commonly known as: ELIQUIS Take 1 tablet (5 mg total) by mouth 2 (two) times daily. Start taking on: Nov 03, 2020   cephALEXin 500 MG capsule Commonly known as: KEFLEX Take 1 capsule (500 mg total) by mouth every 6 (six) hours for 12 doses.   chlorhexidine 0.12 % solution Commonly known as: PERIDEX 5 mLs by Mouth Rinse route 2 (two) times daily.   docusate sodium 100 MG capsule Commonly known as: COLACE Take 1 capsule (100 mg total) by mouth 2 (two) times daily.   ferrous sulfate 325 (65 FE) MG tablet Take 1 tablet (325 mg total) by mouth 3 (three) times daily with meals.   fluticasone-salmeterol 500-50 MCG/ACT Aepb Commonly known as: ADVAIR Inhale 1 puff into the lungs in the morning and at bedtime.   furosemide 40 MG tablet Commonly known as: LASIX Take 0.5 tablets (20 mg total) by mouth every other day. What changed:   how much to take  when to take this   HYDROcodone-acetaminophen 5-325 MG tablet Commonly known as: Norco Take 1 tablet by mouth every 6 (six) hours as needed for moderate pain. What changed: how much to take   metoprolol tartrate 25 MG tablet Commonly known as: LOPRESSOR Take 1 tablet (25 mg total) by mouth 2 (two) times daily.   montelukast 10 MG tablet Commonly known as: SINGULAIR TAKE 1 TABLET BY MOUTH EVERYDAY AT BEDTIME What changed: See the new instructions.   pantoprazole 40 MG tablet Commonly known as: PROTONIX Take 1 tablet (40 mg total) by mouth 2 (two) times daily. What changed: when to take this   phenazopyridine 100 MG tablet Commonly known as: PYRIDIUM Take 100 mg by mouth 3 (three) times daily with meals. For pain   polyethylene glycol 17 g packet Commonly known as: MIRALAX / GLYCOLAX Take 17 g by mouth daily as needed for moderate constipation.   rosuvastatin 10 MG tablet Commonly known as: CRESTOR Take 1 tablet (10 mg total) by mouth daily. Start taking on: Nov 03, 2020   SYSTANE BALANCE OP Place 1 drop into  both eyes daily as needed (for dry eyes).   tamsulosin 0.4 MG Caps capsule Commonly known as: FLOMAX Take 0.4 mg by mouth daily.   triamcinolone 55 MCG/ACT Aero nasal inhaler Commonly known as: Nasacort Allergy 24HR Use one spray in each nostril once daily as directed. What changed:   how much to take  how to take this  when to take this  additional instructions       No Known Allergies  Consultations: PCCM, Cards, GI   Procedures/Studies: DG Chest Port 1 View  Result Date: 10/26/2020 CLINICAL DATA:  Blood in urine back. Eliquis for history of DVT. Unresponsive, tachycardic, and hypertensive. EXAM: PORTABLE CHEST 1 VIEW COMPARISON:  09/22/2020 FINDINGS: Shallow inspiration. Heart size and pulmonary vascularity are normal for inspiratory effort. No focal consolidation or airspace  disease in the lungs. No pleural effusions. No pneumothorax. Mediastinal contours appear intact. Degenerative changes in the spine. IMPRESSION: No active disease. Electronically Signed   By: Lucienne Capers M.D.   On: 10/26/2020 19:19   ECHOCARDIOGRAM COMPLETE  Result Date: 10/27/2020    ECHOCARDIOGRAM REPORT   Patient Name:   Shane [name] Date of Exam: 10/27/2020 Medical Rec #:  [id number]          Height:       74.0 in Accession #:    [id number]         Weight:       250.0 lb Date of Birth:  Jan 30, 1946          BSA:          2.390 m Patient Age:    27 years           BP:           97/58 mmHg Patient Gender: M                  HR:           81 bpm. Exam Location:  Inpatient Procedure: 2D Echo, Cardiac Doppler and Color Doppler Indications:    CHF  History:        Patient has prior history of Echocardiogram examinations, most                 recent 06/19/2020. Risk Factors:Hypertension and Diabetes.  Sonographer:    Cammy Brochure Referring Phys: [id number] ADEWALE A Ander Slade  Sonographer Comments: Image acquisition challenging due to patient body habitus and Image acquisition challenging due to  respiratory motion. IMPRESSIONS  1. Inferior and inferoseptal hypokinesis. Left ventricular ejection fraction, by estimation, is 50 to 55%. The left ventricle has low normal function. The left ventricle demonstrates regional wall motion abnormalities (see scoring diagram/findings for description). There is moderate concentric left ventricular hypertrophy. Left ventricular diastolic parameters are consistent with Grade I diastolic dysfunction (impaired relaxation).  2. Right ventricular systolic function is normal. The right ventricular size is normal.  3. The mitral valve is normal in structure. No evidence of mitral valve regurgitation. No evidence of mitral stenosis.  4. The aortic valve is tricuspid. Aortic valve regurgitation is not visualized. No aortic stenosis is present.  5. The inferior vena cava is normal in size with greater than 50% respiratory variability, suggesting right atrial pressure of 3 mmHg. FINDINGS  Left Ventricle: Inferior and inferoseptal hypokinesis. Left ventricular ejection fraction, by estimation, is 50 to 55%. The left ventricle has low normal function. The left ventricle demonstrates regional wall motion abnormalities. The left ventricular internal cavity size was normal in size. There is moderate concentric left ventricular hypertrophy. Left ventricular diastolic parameters are consistent with Grade I diastolic dysfunction (impaired relaxation). Indeterminate filling pressures. Right Ventricle: The right ventricular size is normal. No increase in right ventricular wall thickness. Right ventricular systolic function is normal. Left Atrium: Left atrial size was normal in size. Right Atrium: Right atrial size was normal in size. Pericardium: There is no evidence of pericardial effusion. Mitral Valve: The mitral valve is normal in structure. No evidence of mitral valve regurgitation. No evidence of mitral valve stenosis. Tricuspid Valve: The tricuspid valve is normal in structure.  Tricuspid valve regurgitation is mild . No evidence of tricuspid stenosis. Aortic Valve: The aortic valve is tricuspid. Aortic valve regurgitation is not visualized. No aortic stenosis is present. Aortic valve mean gradient measures 5.0 mmHg. Aortic  valve peak gradient measures 9.6 mmHg. Aortic valve area, by VTI measures 3.42 cm. Pulmonic Valve: The pulmonic valve was normal in structure. Pulmonic valve regurgitation is trivial. No evidence of pulmonic stenosis. Aorta: The aortic root is normal in size and structure. Venous: The inferior vena cava is normal in size with greater than 50% respiratory variability, suggesting right atrial pressure of 3 mmHg. IAS/Shunts: No atrial level shunt detected by color flow Doppler.  LEFT VENTRICLE PLAX 2D LVIDd:         4.20 cm  Diastology LVIDs:         2.80 cm  LV e' medial:    6.74 cm/s LV PW:         1.40 cm  LV E/e' medial:  10.1 LV IVS:        1.60 cm  LV e' lateral:   10.60 cm/s LVOT diam:     2.30 cm  LV E/e' lateral: 6.4 LV SV:         81 LV SV Index:   34 LVOT Area:     4.15 cm  RIGHT VENTRICLE RV S prime:     11.10 cm/s TAPSE (M-mode): 2.0 cm LEFT ATRIUM             Index       RIGHT ATRIUM           Index LA diam:        3.80 cm 1.59 cm/m  RA Area:     19.00 cm LA Vol (A2C):   43.6 ml 18.25 ml/m RA Volume:   50.50 ml  21.13 ml/m LA Vol (A4C):   32.3 ml 13.52 ml/m LA Biplane Vol: 38.1 ml 15.94 ml/m  AORTIC VALVE AV Area (Vmax):    4.05 cm AV Area (Vmean):   3.63 cm AV Area (VTI):     3.42 cm AV Vmax:           155.00 cm/s AV Vmean:          103.000 cm/s AV VTI:            0.238 m AV Peak Grad:      9.6 mmHg AV Mean Grad:      5.0 mmHg LVOT Vmax:         151.00 cm/s LVOT Vmean:        90.100 cm/s LVOT VTI:          0.196 m LVOT/AV VTI ratio: 0.82  AORTA Ao Root diam: 3.40 cm Ao Asc diam:  3.60 cm MITRAL VALVE               TRICUSPID VALVE MV Area (PHT): 4.15 cm    TR Peak grad:   15.7 mmHg MV Decel Time: 183 msec    TR Vmax:        198.00 cm/s MV E  velocity: 67.90 cm/s MV A velocity: 61.10 cm/s  SHUNTS MV E/A ratio:  1.11        Systemic VTI:  0.20 m                            Systemic Diam: 2.30 cm Skeet Latch MD Electronically signed by Skeet Latch MD Signature Date/Time: 10/27/2020/4:25:51 PM    Final    VAS [place] LOWER EXTREMITY VENOUS (DVT)  Result Date: 10/30/2020  Lower Venous DVT Study Patient Name:  Shane [name]  Date of Exam:   10/30/2020 Medical Rec #:  [id number]           Accession #:    [id number] Date of Birth: 1945-07-17           Patient Gender: M Patient Age:   75Y Exam Location:  [facility] Procedure:      VAS [place] LOWER EXTREMITY VENOUS (DVT) Referring Phys: [id number] [facility] --------------------------------------------------------------------------------  Indications: Rapidly rising d-dimer, history of chronic DVT.  Anticoagulation: Heparin injection. Limitations: Poor ultrasound/tissue interface. Comparison Study: 09-05-2020 Most recent prior lower venous exam was positive                   for chronic DVT involving the RT femoral vein, popliteal vein,                   and posterior tibial veins. Performing Technologist: Rogelia Rohrer RDMS, RVT  Examination Guidelines: A complete evaluation includes B-mode imaging, spectral Doppler, color Doppler, and power Doppler as needed of all accessible portions of each vessel. Bilateral testing is considered [name] integral part of a complete examination. Limited examinations for reoccurring indications may be performed as noted. The reflux portion of the exam is performed with the patient in reverse Trendelenburg.  +---------+---------------+---------+-----------+----------+-------------------+ RIGHT    CompressibilityPhasicitySpontaneityPropertiesThrombus Aging      +---------+---------------+---------+-----------+----------+-------------------+ CFV      Full           Yes      Yes                                       +---------+---------------+---------+-----------+----------+-------------------+ SFJ      Full                                                             +---------+---------------+---------+-----------+----------+-------------------+ FV Prox  Full                                                             +---------+---------------+---------+-----------+----------+-------------------+ FV Mid   Partial        Yes      Yes                  Chronic             +---------+---------------+---------+-----------+----------+-------------------+ FV DistalPartial        Yes      Yes                  Chronic             +---------+---------------+---------+-----------+----------+-------------------+ PFV      Full                                                             +---------+---------------+---------+-----------+----------+-------------------+ POP      Partial        Yes  Yes                  Chronic             +---------+---------------+---------+-----------+----------+-------------------+ PTV      Partial                                      Chronic             +---------+---------------+---------+-----------+----------+-------------------+ PERO                                                  Not well visualized +---------+---------------+---------+-----------+----------+-------------------+   +---------+---------------+---------+-----------+----------+--------------+ LEFT     CompressibilityPhasicitySpontaneityPropertiesThrombus Aging +---------+---------------+---------+-----------+----------+--------------+ CFV      Full           Yes      Yes                                 +---------+---------------+---------+-----------+----------+--------------+ SFJ      Full                                                        +---------+---------------+---------+-----------+----------+--------------+ FV Prox  Partial        Yes       Yes                  Chronic        +---------+---------------+---------+-----------+----------+--------------+ FV Mid   None           No       No                   Chronic        +---------+---------------+---------+-----------+----------+--------------+ FV DistalNone           No       No                   Chronic        +---------+---------------+---------+-----------+----------+--------------+ PFV      Full                                                        +---------+---------------+---------+-----------+----------+--------------+ POP      Partial        Yes      Yes                  Chronic        +---------+---------------+---------+-----------+----------+--------------+ PTV      Partial                                      Chronic        +---------+---------------+---------+-----------+----------+--------------+ PERO     Full                                                        +---------+---------------+---------+-----------+----------+--------------+  Summary: BILATERAL: -No evidence of popliteal cyst, bilaterally. RIGHT: - Findings consistent with 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  LEFT: - Findings consistent with chronic deep vein thrombosis involving the left femoral vein, left popliteal vein, and left posterior tibial veins.  *See table(s) above for measurements and observations. Electronically signed by Ruta Hinds MD on 10/30/2020 at 2:34:11 PM.    Final      Procedure:                Upper GI endoscopy Indications:              Acute post hemorrhagic anemia, Iron deficiency                            anemia, Heme positive stool Providers:                Gerrit Heck, MD, Janee Morn, Technician,                            Kary Kos RN, RN Referring MD:              Medicines:                Monitored Anesthesia Care Complications:             No immediate complications. Estimated Blood Loss:     Estimated blood loss was minimal. Procedure:                Pre-Anesthesia Assessment:                           - Prior to the procedure, a History and Physical                            was performed, and patient medications and                            allergies were reviewed. The patient's tolerance of                            previous anesthesia was also reviewed. The risks                            and benefits of the procedure and the sedation                            options and risks were discussed with the patient.                            All questions were answered, and informed consent                            was obtained. Prior Anticoagulants: The patient has                            taken Eliquis (apixaban), last dose was 3 days  prior to procedure. ASA Grade Assessment: III - A                            patient with severe systemic disease. After                            reviewing the risks and benefits, the patient was                            deemed in satisfactory condition to undergo the                            procedure.                           After obtaining informed consent, the endoscope was                            passed under direct vision. Throughout the                            procedure, the patient's blood pressure, pulse, and                            oxygen saturations were monitored continuously. The                            [id number]) Olympus gastroscope was                            introduced through the mouth, and advanced to the                            second part of duodenum. The upper GI endoscopy was                            accomplished without difficulty. The patient                            tolerated the procedure well. Scope In: Scope Out: Findings:      A non-obstructing Schatzki ring was found in the lower  third of the       esophagus.      A 2 cm hiatal hernia was present.      One oozing superficial gastric ulcer was found in the gastric fundus.       The lesion was 3 mm in largest dimension. For hemostasis, two hemostatic       clips were successfully placed (MR conditional). There was no bleeding       at the end of the procedure.      Scattered mild inflammation characterized by erythema was found in the       gastric fundus, in the gastric body and in the gastric antrum. Biopsies       were taken with a cold forceps for histology. Estimated blood loss was       minimal.  A single 14 mm sessile polyp with no bleeding was found in the duodenal       bulb. This appeared to have [name] ulcerated tip, but no active bleeding or       stigmata of recent bleeding. The polyp was removed with a hot snare.       Resection and retrieval were complete. Given recent bleed and need to       resume anticoagulation, the defect was closed using three hemostatic       clips. There was no bleeding at the end of the procedure. Estimated       blood loss: none.      Localized moderate inflammation characterized by congestion (edema),       erosions and erythema was found in the duodenal bulb. Biopsies were       taken with a cold forceps for histology. Estimated blood loss was       minimal.      The second portion of the duodenum was normal. Impression:               - Non-obstructing Schatzki ring.                           - 2 cm hiatal hernia.                           - Oozing gastric ulcer. Clips (MR conditional) were                            placed.                           - Gastritis. Biopsied.                           - A single duodenal polyp. Resected and retrieved.                            Clips were placed.                           - Duodenitis. Biopsied.                           - Normal second portion of the duodenum. Recommendation:           - Return patient to hospital ward  for ongoing care.                           - Full liquid diet today, and can advance as                            tolerated tomorrow.                           - Continue present medications.                           - Use Protonix (pantoprazole) 40 mg PO BID for 8  weeks, then reduce to 40 mg daily.                           - If no further bleeding, ok to resume Eliquis                            (apixaban) at prior dose in 3 days.                           - Await pathology results.                           - Repeat upper endoscopy in 6-8 weeks to check                            healing.                           - If continued anemia or concern for ongoing                            bleeding, will need colonoscopy on this admission                            (refused prep last night) for additional diagnostic                            and therapeutic intent. If no more evidence of                            bleeding, will plan on colonoscopy as [name] outpatient                            for ongoing polyp surveillance.                           - GI service will sign off at this time. Please do                            not hesitate to contact with additional questions                            or if concern for ongoing bleeding. Otherwise, will                            start to arrange for outpatient follow-up in the GI                            clinic to include repeat CBC check 7-10 days post                            discharge, follow-up appointment, EGD in 6-8 weeks,  and colonoscopy. Procedure Code(s):        --- Professional ---                           [phone], 25, Esophagogastroduodenoscopy, flexible,                            transoral; with control of bleeding, any method                           43251, Esophagogastroduodenoscopy, flexible,                            transoral; with removal of tumor(s), polyp(s),  or                            other lesion(s) by snare technique                           43239, 86, Esophagogastroduodenoscopy, flexible,                            transoral; with biopsy, single or multiple Diagnosis Code(s):        --- Professional ---                           K22.2, Esophageal obstruction                           K44.9, Diaphragmatic hernia without obstruction or                            gangrene                           K25.4, Chronic or unspecified gastric ulcer with                            hemorrhage                           K29.70, Gastritis, unspecified, without bleeding                           K31.7, Polyp of stomach and duodenum                           K29.80, Duodenitis without bleeding                           D62, Acute posthemorrhagic anemia                           D50.9, Iron deficiency anemia, unspecified                           R19.5, Other fecal abnormalities CPT copyright 2019 American Medical Association. All rights reserved. The codes documented in this report are preliminary  and upon coder review may  be revised to meet current compliance requirements. Gerrit Heck, MD 10/31/2020 8:44:57 AM Number of Addenda: 0         Last signed by: Lavena Bullion, DO at 10/31/2020 8:45 AM      Subjective:   Discharge Exam: Vitals:   11/02/20 0726 11/02/20 0800  BP: (!) 114/102 116/63  Pulse: 97 69  Resp: 15 19  Temp: 98.4 F (36.9 C) 98.6 F (37 C)  SpO2: 95% 95%   Vitals:   11/02/20 0318 11/02/20 0500 11/02/20 0726 11/02/20 0800  BP: 120/66  (!) 114/102 116/63  Pulse: 71  97 69  Resp: 14  15 19   Temp: 98.2 F (36.8 C)  98.4 F (36.9 C) 98.6 F (37 C)  TempSrc: Axillary  Axillary Oral  SpO2: 95%  95% 95%  Weight:  121.7 kg    Height:        General: Pt is alert, frail , ill-appearing, , flat affect.   Cardiovascular: RRR, S1/S2 +, no rubs, no gallops Respiratory: CTA bilaterally, no wheezing, no  rhonchi Abdominal: Soft, NT, ND, bowel sounds + Extremities: no edema, no cyanosis, long toenail  Foley in  Place.    The results of significant diagnostics from this hospitalization (including imaging, microbiology, ancillary and laboratory) are listed below for reference.     Microbiology: Recent Results (from the past 240 hour(s))  Resp Panel by RT-PCR (Flu A&B, Covid) Nasopharyngeal Swab     Status: None   Collection Time: 10/26/20  5:54 PM   Specimen: Nasopharyngeal Swab; Nasopharyngeal(NP) swabs in vial transport medium  Result Value Ref Range Status   SARS Coronavirus 2 by RT PCR NEGATIVE NEGATIVE Final    Comment: (NOTE) SARS-CoV-2 target nucleic acids are NOT DETECTED.  The SARS-CoV-2 RNA is generally detectable in upper respiratory specimens during the acute phase of infection. The lowest concentration of SARS-CoV-2 viral copies this assay can detect is 138 copies/mL. A negative result does not preclude SARS-Cov-2 infection and should not be used as the sole basis for treatment or other patient management decisions. A negative result may occur with  improper specimen collection/handling, submission of specimen other than nasopharyngeal swab, presence of viral mutation(s) within the areas targeted by this assay, and inadequate number of viral copies(<138 copies/mL). A negative result must be combined with clinical observations, patient history, and epidemiological information. The expected result is Negative.  Fact Sheet for Patients:  EntrepreneurPulse.com.au  Fact Sheet for Healthcare Providers:  IncredibleEmployment.be  This test is no t yet approved or cleared by the [place] FDA and  has been authorized for detection and/or diagnosis of SARS-CoV-2 by FDA under [name] Emergency Use Authorization (EUA). This EUA will remain  in effect (meaning this test can be used) for the duration of the COVID-19 declaration under Section  564(b)(1) of the Act, 21 U.S.C.section 360bbb-3(b)(1), unless the authorization is terminated  or revoked sooner.       Influenza A by PCR NEGATIVE NEGATIVE Final   Influenza B by PCR NEGATIVE NEGATIVE Final    Comment: (NOTE) The Xpert Xpress SARS-CoV-2/FLU/RSV plus assay is intended as [name] aid in the diagnosis of influenza from Nasopharyngeal swab specimens and should not be used as a sole basis for treatment. Nasal washings and aspirates are unacceptable for Xpert Xpress SARS-CoV-2/FLU/RSV testing.  Fact Sheet for Patients: EntrepreneurPulse.com.au  Fact Sheet for Healthcare Providers: IncredibleEmployment.be  This test is not yet approved or cleared by the [place] and  has been authorized for detection and/or diagnosis of SARS-CoV-2 by FDA under [name] Emergency Use Authorization (EUA). This EUA will remain in effect (meaning this test can be used) for the duration of the COVID-19 declaration under Section 564(b)(1) of the Act, 21 U.S.C. section 360bbb-3(b)(1), unless the authorization is terminated or revoked.  Performed at [facility], [address] [place], [place] [address]   Urine culture     Status: Abnormal   Collection Time: 10/26/20  5:54 PM   Specimen: In/Out Cath Urine  Result Value Ref Range Status   Specimen Description IN/OUT CATH URINE  Final   Special Requests   Final    NONE Performed at [facility], [address] [place], [place] [address]    Culture >=100,000 COLONIES/mL ESCHERICHIA COLI (A)  Final   Report Status 10/29/2020 FINAL  Final   Organism ID, Bacteria ESCHERICHIA COLI (A)  Final      Susceptibility   Escherichia coli - MIC*    AMPICILLIN >=32 RESISTANT Resistant     CEFAZOLIN <=4 SENSITIVE Sensitive     CEFEPIME <=0.12 SENSITIVE Sensitive     CEFTRIAXONE <=0.25 SENSITIVE Sensitive     CIPROFLOXACIN >=4 RESISTANT Resistant     GENTAMICIN <=1 SENSITIVE Sensitive     IMIPENEM  <=0.25 SENSITIVE Sensitive     NITROFURANTOIN <=16 SENSITIVE Sensitive     TRIMETH/SULFA <=20 SENSITIVE Sensitive     AMPICILLIN/SULBACTAM 16 INTERMEDIATE Intermediate     PIP/TAZO <=4 SENSITIVE Sensitive     * >=100,000 COLONIES/mL ESCHERICHIA COLI  Blood Culture (routine x 2)     Status: Abnormal   Collection Time: 10/26/20  6:20 PM   Specimen: BLOOD LEFT HAND  Result Value Ref Range Status   Specimen Description BLOOD LEFT HAND  Final   Special Requests   Final    BOTTLES DRAWN AEROBIC ONLY Blood Culture results may not be optimal due to [name] inadequate volume of blood received in culture bottles   Culture  Setup Time   Final    GRAM NEGATIVE RODS AEROBIC BOTTLE ONLY CRITICAL RESULT CALLED TO, READ BACK BY AND VERIFIED WITH: Frederico Hamman [phone] FCP Performed at [facility], [address] [place], [place] [address]    Culture ESCHERICHIA COLI (A)  Final   Report Status 10/29/2020 FINAL  Final   Organism ID, Bacteria ESCHERICHIA COLI  Final      Susceptibility   Escherichia coli - MIC*    AMPICILLIN >=32 RESISTANT Resistant     CEFAZOLIN <=4 SENSITIVE Sensitive     CEFEPIME <=0.12 SENSITIVE Sensitive     CEFTAZIDIME <=1 SENSITIVE Sensitive     CEFTRIAXONE <=0.25 SENSITIVE Sensitive     CIPROFLOXACIN >=4 RESISTANT Resistant     GENTAMICIN <=1 SENSITIVE Sensitive     IMIPENEM <=0.25 SENSITIVE Sensitive     TRIMETH/SULFA <=20 SENSITIVE Sensitive     AMPICILLIN/SULBACTAM 16 INTERMEDIATE Intermediate     PIP/TAZO <=4 SENSITIVE Sensitive     * ESCHERICHIA COLI  Blood Culture ID Panel (Reflexed)     Status: Abnormal   Collection Time: 10/26/20  6:20 PM  Result Value Ref Range Status   Enterococcus faecalis NOT DETECTED NOT DETECTED Final   Enterococcus Faecium NOT DETECTED NOT DETECTED Final   Listeria monocytogenes NOT DETECTED NOT DETECTED Final   Staphylococcus species NOT DETECTED NOT DETECTED Final   Staphylococcus aureus (BCID) NOT DETECTED NOT DETECTED  Final   Staphylococcus epidermidis NOT DETECTED NOT  DETECTED Final   Staphylococcus lugdunensis NOT DETECTED NOT DETECTED Final   Streptococcus species NOT DETECTED NOT DETECTED Final   Streptococcus agalactiae NOT DETECTED NOT DETECTED Final   Streptococcus pneumoniae NOT DETECTED NOT DETECTED Final   Streptococcus pyogenes NOT DETECTED NOT DETECTED Final   A.calcoaceticus-baumannii NOT DETECTED NOT DETECTED Final   Bacteroides fragilis NOT DETECTED NOT DETECTED Final   Enterobacterales DETECTED (A) NOT DETECTED Final    Comment: Enterobacterales represent a large order of gram negative bacteria, not a single organism. CRITICAL RESULT CALLED TO, READ BACK BY AND VERIFIED WITH: PHARMD C. Sherryll Burger [id number] FCP    Enterobacter cloacae complex NOT DETECTED NOT DETECTED Final   Escherichia coli DETECTED (A) NOT DETECTED Final    Comment: CRITICAL RESULT CALLED TO, READ BACK BY AND VERIFIED WITH: PHARMD CSherryll Burger [id number] FCP    Klebsiella aerogenes NOT DETECTED NOT DETECTED Final   Klebsiella oxytoca NOT DETECTED NOT DETECTED Final   Klebsiella pneumoniae NOT DETECTED NOT DETECTED Final   Proteus species NOT DETECTED NOT DETECTED Final   Salmonella species NOT DETECTED NOT DETECTED Final   Serratia marcescens NOT DETECTED NOT DETECTED Final   Haemophilus influenzae NOT DETECTED NOT DETECTED Final   Neisseria meningitidis NOT DETECTED NOT DETECTED Final   Pseudomonas aeruginosa NOT DETECTED NOT DETECTED Final   Stenotrophomonas maltophilia NOT DETECTED NOT DETECTED Final   Candida albicans NOT DETECTED NOT DETECTED Final   Candida auris NOT DETECTED NOT DETECTED Final   Candida glabrata NOT DETECTED NOT DETECTED Final   Candida krusei NOT DETECTED NOT DETECTED Final   Candida parapsilosis NOT DETECTED NOT DETECTED Final   Candida tropicalis NOT DETECTED NOT DETECTED Final   Cryptococcus neoformans/gattii NOT DETECTED NOT DETECTED Final   CTX-M ESBL NOT DETECTED NOT DETECTED  Final   Carbapenem resistance IMP NOT DETECTED NOT DETECTED Final   Carbapenem resistance KPC NOT DETECTED NOT DETECTED Final   Carbapenem resistance NDM NOT DETECTED NOT DETECTED Final   Carbapenem resist OXA 48 LIKE NOT DETECTED NOT DETECTED Final   Carbapenem resistance VIM NOT DETECTED NOT DETECTED Final    Comment: Performed at [facility], [address], [place], [place] [address]  MRSA PCR Screening     Status: None   Collection Time: 10/27/20 12:27 AM   Specimen: Nasal Mucosa; Nasopharyngeal  Result Value Ref Range Status   MRSA by PCR NEGATIVE NEGATIVE Final    Comment:        The GeneXpert MRSA Assay (FDA approved for NASAL specimens only), is one component of a comprehensive MRSA colonization surveillance program. It is not intended to diagnose MRSA infection nor to guide or monitor treatment for MRSA infections. Performed at [facility], [place] [address] [place], [place] [address]      Labs: BNP (last 3 results) Recent Labs    10/31/20 0312 11/01/20 0120 11/02/20 0145  BNP 548.1* 259.1* [id number]*   Basic Metabolic Panel: Recent Labs  Lab 10/27/20 0118 10/27/20 1255 10/28/20 0042 10/29/20 0031 10/29/20 0747 10/30/20 0104 10/31/20 0312 11/01/20 0120 11/02/20 0145  NA 139 134*   < > 135  --  139 139 141 138  K 2.6* 3.7   < > 3.5  --  3.2* 3.2* 3.6 3.6  CL 101 101   < > 103  --  106 109 112* 108  CO2 18* 18*   < > 21*  --  25 22 22 22   GLUCOSE 87 167*   < >  79  --  76 85 115* 88  BUN 18 18   < > 18  --  15 14 11 14   CREATININE 2.17* 1.87*   < > 1.17  --  1.02 0.97 0.96 0.94  CALCIUM 8.5* 7.7*   < > 7.6*  --  8.0* 8.3* 8.4* 8.1*  MG 1.2* 1.5*  --   --  1.8 1.7 1.5* 1.6* 1.7  PHOS 1.8* 4.4  --   --   --   --   --   --   --    < > = values in this interval not displayed.   Liver Function Tests: Recent Labs  Lab 10/26/20 1807 10/30/20 0104 10/31/20 0312 11/01/20 0120 11/02/20 0145  AST 23 43* 31 23 18   ALT 10 16 16 16 14   ALKPHOS  147* 148* 191* 167* 134*  BILITOT 1.7* 0.6 0.7 0.5 0.8  PROT 7.0 5.0* 5.6* 5.7* 5.4*  ALBUMIN 2.9* 1.8* 2.0* 2.0* 1.9*   No results for input(s): LIPASE, AMYLASE in the last 168 hours. No results for input(s): AMMONIA in the last 168 hours. CBC: Recent Labs  Lab 10/26/20 1807 10/26/20 2353 10/29/20 0031 10/29/20 2207 10/30/20 0104 10/31/20 0312 11/01/20 0120 11/02/20 0145  WBC 1.4*   < > 21.7*  --  18.0* 13.9* 11.4* 10.5  NEUTROABS 0.9*  --   --   --  14.8* 9.7* 6.8 8.0*  HGB 11.7*   < > 7.2* 9.3* 9.2* 10.5* 10.2* 9.6*  HCT 38.1*   < > 22.0* 28.3* 27.5* 32.2* 31.4* 30.2*  MCV 61.2*   < > 57.9*  --  61.1* 61.3* 61.9* 62.3*  PLT 206   < > 129*  --  123* 158 185 192   < > = values in this interval not displayed.   Cardiac Enzymes: No results for input(s): CKTOTAL, CKMB, CKMBINDEX, TROPONINI in the last 168 hours. BNP: Invalid input(s): POCBNP CBG: Recent Labs  Lab 10/26/20 1745 10/27/20 1529 10/28/20 0707 10/28/20 1057  GLUCAP 126* 157* 99 145*   D-Dimer No results for input(s): DDIMER in the last 72 hours. Hgb A1c No results for input(s): HGBA1C in the last 72 hours. Lipid Profile No results for input(s): CHOL, HDL, LDLCALC, TRIG, CHOLHDL, LDLDIRECT in the last 72 hours. Thyroid function studies No results for input(s): TSH, T4TOTAL, T3FREE, THYROIDAB in the last 72 hours.  Invalid input(s): FREET3 Anemia work up No results for input(s): VITAMINB12, FOLATE, FERRITIN, TIBC, IRON, RETICCTPCT in the last 72 hours. Urinalysis    Component Value Date/Time   COLORURINE AMBER (A) 10/26/2020 1754   APPEARANCEUR HAZY (A) 10/26/2020 1754   LABSPEC 1.009 10/26/2020 1754   PHURINE 7.0 10/26/2020 1754   GLUCOSEU NEGATIVE 10/26/2020 1754   HGBUR LARGE (A) 10/26/2020 1754   BILIRUBINUR NEGATIVE 10/26/2020 1754   KETONESUR NEGATIVE 10/26/2020 1754   PROTEINUR NEGATIVE 10/26/2020 1754   NITRITE POSITIVE (A) 10/26/2020 1754   LEUKOCYTESUR TRACE (A) 10/26/2020 1754    Sepsis Labs Invalid input(s): PROCALCITONIN,  WBC,  LACTICIDVEN Microbiology Recent Results (from the past 240 hour(s))  Resp Panel by RT-PCR (Flu A&B, Covid) Nasopharyngeal Swab     Status: None   Collection Time: 10/26/20  5:54 PM   Specimen: Nasopharyngeal Swab; Nasopharyngeal(NP) swabs in vial transport medium  Result Value Ref Range Status   SARS Coronavirus 2 by RT PCR NEGATIVE NEGATIVE Final    Comment: (NOTE) SARS-CoV-2 target nucleic acids are NOT DETECTED.  The SARS-CoV-2 RNA is generally detectable  in upper respiratory specimens during the acute phase of infection. The lowest concentration of SARS-CoV-2 viral copies this assay can detect is 138 copies/mL. A negative result does not preclude SARS-Cov-2 infection and should not be used as the sole basis for treatment or other patient management decisions. A negative result may occur with  improper specimen collection/handling, submission of specimen other than nasopharyngeal swab, presence of viral mutation(s) within the areas targeted by this assay, and inadequate number of viral copies(<138 copies/mL). A negative result must be combined with clinical observations, patient history, and epidemiological information. The expected result is Negative.  Fact Sheet for Patients:  EntrepreneurPulse.com.au  Fact Sheet for Healthcare Providers:  IncredibleEmployment.be  This test is no t yet approved or cleared by the [place] FDA and  has been authorized for detection and/or diagnosis of SARS-CoV-2 by FDA under [name] Emergency Use Authorization (EUA). This EUA will remain  in effect (meaning this test can be used) for the duration of the COVID-19 declaration under Section 564(b)(1) of the Act, 21 U.S.C.section 360bbb-3(b)(1), unless the authorization is terminated  or revoked sooner.       Influenza A by PCR NEGATIVE NEGATIVE Final   Influenza B by PCR NEGATIVE NEGATIVE Final     Comment: (NOTE) The Xpert Xpress SARS-CoV-2/FLU/RSV plus assay is intended as [name] aid in the diagnosis of influenza from Nasopharyngeal swab specimens and should not be used as a sole basis for treatment. Nasal washings and aspirates are unacceptable for Xpert Xpress SARS-CoV-2/FLU/RSV testing.  Fact Sheet for Patients: EntrepreneurPulse.com.au  Fact Sheet for Healthcare Providers: IncredibleEmployment.be  This test is not yet approved or cleared by the [place] FDA and has been authorized for detection and/or diagnosis of SARS-CoV-2 by FDA under [name] Emergency Use Authorization (EUA). This EUA will remain in effect (meaning this test can be used) for the duration of the COVID-19 declaration under Section 564(b)(1) of the Act, 21 U.S.C. section 360bbb-3(b)(1), unless the authorization is terminated or revoked.  Performed at [facility], [place] [address], [place], [place] [address]   Urine culture     Status: Abnormal   Collection Time: 10/26/20  5:54 PM   Specimen: In/Out Cath Urine  Result Value Ref Range Status   Specimen Description IN/OUT CATH URINE  Final   Special Requests   Final    NONE Performed at [facility], [place] [address], [place], [place] [address]    Culture >=100,000 COLONIES/mL ESCHERICHIA COLI (A)  Final   Report Status 10/29/2020 FINAL  Final   Organism ID, Bacteria ESCHERICHIA COLI (A)  Final      Susceptibility   Escherichia coli - MIC*    AMPICILLIN >=32 RESISTANT Resistant     CEFAZOLIN <=4 SENSITIVE Sensitive     CEFEPIME <=0.12 SENSITIVE Sensitive     CEFTRIAXONE <=0.25 SENSITIVE Sensitive     CIPROFLOXACIN >=4 RESISTANT Resistant     GENTAMICIN <=1 SENSITIVE Sensitive     IMIPENEM <=0.25 SENSITIVE Sensitive     NITROFURANTOIN <=16 SENSITIVE Sensitive     TRIMETH/SULFA <=20 SENSITIVE Sensitive     AMPICILLIN/SULBACTAM 16 INTERMEDIATE Intermediate     PIP/TAZO <=4 SENSITIVE Sensitive     *  >=100,000 COLONIES/mL ESCHERICHIA COLI  Blood Culture (routine x 2)     Status: Abnormal   Collection Time: 10/26/20  6:20 PM   Specimen: BLOOD LEFT HAND  Result Value Ref Range Status   Specimen Description BLOOD LEFT HAND  Final   Special Requests  Final    BOTTLES DRAWN AEROBIC ONLY Blood Culture results may not be optimal due to [name] inadequate volume of blood received in culture bottles   Culture  Setup Time   Final    GRAM NEGATIVE RODS AEROBIC BOTTLE ONLY CRITICAL RESULT CALLED TO, READ BACK BY AND VERIFIED WITH: [place] [id number] FCP Performed at [facility], [place] [address], [place], [place] [address]    Culture ESCHERICHIA COLI (A)  Final   Report Status 10/29/2020 FINAL  Final   Organism ID, Bacteria ESCHERICHIA COLI  Final      Susceptibility   Escherichia coli - MIC*    AMPICILLIN >=32 RESISTANT Resistant     CEFAZOLIN <=4 SENSITIVE Sensitive     CEFEPIME <=0.12 SENSITIVE Sensitive     CEFTAZIDIME <=1 SENSITIVE Sensitive     CEFTRIAXONE <=0.25 SENSITIVE Sensitive     CIPROFLOXACIN >=4 RESISTANT Resistant     GENTAMICIN <=1 SENSITIVE Sensitive     IMIPENEM <=0.25 SENSITIVE Sensitive     TRIMETH/SULFA <=20 SENSITIVE Sensitive     AMPICILLIN/SULBACTAM 16 INTERMEDIATE Intermediate     PIP/TAZO <=4 SENSITIVE Sensitive     * ESCHERICHIA COLI  Blood Culture ID Panel (Reflexed)     Status: Abnormal   Collection Time: 10/26/20  6:20 PM  Result Value Ref Range Status   Enterococcus faecalis NOT DETECTED NOT DETECTED Final   Enterococcus Faecium NOT DETECTED NOT DETECTED Final   Listeria monocytogenes NOT DETECTED NOT DETECTED Final   Staphylococcus species NOT DETECTED NOT DETECTED Final   Staphylococcus aureus (BCID) NOT DETECTED NOT DETECTED Final   Staphylococcus epidermidis NOT DETECTED NOT DETECTED Final   Staphylococcus lugdunensis NOT DETECTED NOT DETECTED Final   Streptococcus species NOT DETECTED NOT DETECTED Final   Streptococcus agalactiae NOT  DETECTED NOT DETECTED Final   Streptococcus pneumoniae NOT DETECTED NOT DETECTED Final   Streptococcus pyogenes NOT DETECTED NOT DETECTED Final   A.calcoaceticus-baumannii NOT DETECTED NOT DETECTED Final   Bacteroides fragilis NOT DETECTED NOT DETECTED Final   Enterobacterales DETECTED (A) NOT DETECTED Final    Comment: Enterobacterales represent a large order of gram negative bacteria, not a single organism. CRITICAL RESULT CALLED TO, READ BACK BY AND VERIFIED WITH: PHARMD C. Sherryll Burger [id number] FCP    Enterobacter cloacae complex NOT DETECTED NOT DETECTED Final   Escherichia coli DETECTED (A) NOT DETECTED Final    Comment: CRITICAL RESULT CALLED TO, READ BACK BY AND VERIFIED WITH: PHARMD CSherryll Burger [id number] FCP    Klebsiella aerogenes NOT DETECTED NOT DETECTED Final   Klebsiella oxytoca NOT DETECTED NOT DETECTED Final   Klebsiella pneumoniae NOT DETECTED NOT DETECTED Final   Proteus species NOT DETECTED NOT DETECTED Final   Salmonella species NOT DETECTED NOT DETECTED Final   Serratia marcescens NOT DETECTED NOT DETECTED Final   Haemophilus influenzae NOT DETECTED NOT DETECTED Final   Neisseria meningitidis NOT DETECTED NOT DETECTED Final   Pseudomonas aeruginosa NOT DETECTED NOT DETECTED Final   Stenotrophomonas maltophilia NOT DETECTED NOT DETECTED Final   Candida albicans NOT DETECTED NOT DETECTED Final   Candida auris NOT DETECTED NOT DETECTED Final   Candida glabrata NOT DETECTED NOT DETECTED Final   Candida krusei NOT DETECTED NOT DETECTED Final   Candida parapsilosis NOT DETECTED NOT DETECTED Final   Candida tropicalis NOT DETECTED NOT DETECTED Final   Cryptococcus neoformans/gattii NOT DETECTED NOT DETECTED Final   CTX-M ESBL NOT DETECTED NOT DETECTED Final   Carbapenem resistance IMP NOT DETECTED  NOT DETECTED Final   Carbapenem resistance KPC NOT DETECTED NOT DETECTED Final   Carbapenem resistance NDM NOT DETECTED NOT DETECTED Final   Carbapenem resist OXA 48 LIKE  NOT DETECTED NOT DETECTED Final   Carbapenem resistance VIM NOT DETECTED NOT DETECTED Final    Comment: Performed at [facility], [place] [address] [place], [place] [address]  MRSA PCR Screening     Status: None   Collection Time: 10/27/20 12:27 AM   Specimen: Nasal Mucosa; Nasopharyngeal  Result Value Ref Range Status   MRSA by PCR NEGATIVE NEGATIVE Final    Comment:        The GeneXpert MRSA Assay (FDA approved for NASAL specimens only), is one component of a comprehensive MRSA colonization surveillance program. It is not intended to diagnose MRSA infection nor to guide or monitor treatment for MRSA infections. Performed at [facility], [place] [address] [place], [place] [address]      Time coordinating discharge: Over 30 minutes  SIGNED:   Phillips Climes, MD  [facility] 11/02/2020, 10:28 AM Pager   If 7PM-7AM, please contact night-coverage [email or web address] Password [email or web address]

## 2020-11-02 NOTE — Progress Notes (Signed)
Nsg Discharge Note  Admit Date:  10/26/2020 Discharge date: 11/02/2020   De Burrs to be D/C'd Skilled nursing facility per MD order.     Discharge Medication: Allergies as of 11/02/2020   No Known Allergies     Medication List    STOP taking these medications   amLODipine 10 MG tablet Commonly known as: NORVASC   Breo Ellipta 200-25 MCG/INH Aepb Generic drug: fluticasone furoate-vilanterol   nitrofurantoin (macrocrystal-monohydrate) 100 MG capsule Commonly known as: MACROBID     TAKE these medications   albuterol 108 (90 Base) MCG/ACT inhaler Commonly known as: VENTOLIN HFA INHALE TWO PUFFS EVERY FOUR TO SIX HOURS AS NEEDED FOR COUGH OR WHEEZE. What changed:   how much to take  how to take this  when to take this  reasons to take this  additional instructions   alum & mag hydroxide-simeth 400-400-40 MG/5ML suspension Commonly known as: Mylanta Maximum Strength Take 5 mLs by mouth every 6 (six) hours as needed (abdominal pain or indigestion).   apixaban 5 MG Tabs tablet Commonly known as: ELIQUIS Take 1 tablet (5 mg total) by mouth 2 (two) times daily. Start taking on: Nov 03, 2020   cephALEXin 500 MG capsule Commonly known as: KEFLEX Take 1 capsule (500 mg total) by mouth every 6 (six) hours for 12 doses.   chlorhexidine 0.12 % solution Commonly known as: PERIDEX 5 mLs by Mouth Rinse route 2 (two) times daily.   docusate sodium 100 MG capsule Commonly known as: COLACE Take 1 capsule (100 mg total) by mouth 2 (two) times daily.   ferrous sulfate 325 (65 FE) MG tablet Take 1 tablet (325 mg total) by mouth 3 (three) times daily with meals.   fluticasone-salmeterol 500-50 MCG/ACT Aepb Commonly known as: ADVAIR Inhale 1 puff into the lungs in the morning and at bedtime. Notes to patient: Did not receive in hospital; resume as directed.   furosemide 40 MG tablet Commonly known as: LASIX Take 0.5 tablets (20 mg total) by mouth every other  day. What changed:   how much to take  when to take this   HYDROcodone-acetaminophen 5-325 MG tablet Commonly known as: Norco Take 1 tablet by mouth every 6 (six) hours as needed for moderate pain. What changed: how much to take   metoprolol tartrate 25 MG tablet Commonly known as: LOPRESSOR Take 1 tablet (25 mg total) by mouth 2 (two) times daily.   montelukast 10 MG tablet Commonly known as: SINGULAIR TAKE 1 TABLET BY MOUTH EVERYDAY AT BEDTIME What changed: See the new instructions. Notes to patient: Did not receive in hospital; resume as directed.   pantoprazole 40 MG tablet Commonly known as: PROTONIX Take 1 tablet (40 mg total) by mouth 2 (two) times daily. What changed: when to take this   phenazopyridine 100 MG tablet Commonly known as: PYRIDIUM Take 100 mg by mouth 3 (three) times daily with meals. For pain Notes to patient: Did not receive in hospital; resume as directed.   polyethylene glycol 17 g packet Commonly known as: MIRALAX / GLYCOLAX Take 17 g by mouth daily as needed for moderate constipation.   rosuvastatin 10 MG tablet Commonly known as: CRESTOR Take 1 tablet (10 mg total) by mouth daily. Start taking on: Nov 03, 2020   SYSTANE BALANCE OP Place 1 drop into both eyes daily as needed (for dry eyes). Notes to patient: Did not receive in hospital; resume as directed.   tamsulosin 0.4 MG Caps capsule Commonly known as:  FLOMAX Take 0.4 mg by mouth daily. Notes to patient: Did not receive in hospital; resume as directed.   triamcinolone 55 MCG/ACT Aero nasal inhaler Commonly known as: Nasacort Allergy 24HR Use one spray in each nostril once daily as directed. What changed:   how much to take  how to take this  when to take this  additional instructions Notes to patient: Did not receive in hospital; resume as directed.       Discharge Assessment: Vitals:   11/02/20 0726 11/02/20 0800  BP: (!) 114/102 116/63  Pulse: 97 69  Resp: 15 19   Temp: 98.4 F (36.9 C) 98.6 F (37 C)  SpO2: 95% 95%   Skin clean, dry and intact without evidence of skin break down, no evidence of skin tears noted. IV catheter discontinued intact. Site without signs and symptoms of complications - no redness or edema noted at insertion site, patient denies c/o pain - only slight tenderness at site.  Dressing with slight pressure applied.  D/c Instructions-Education: Discharge instructions given to patient/family with verbalized understanding. D/c education completed with patient/family including follow up instructions, medication list, d/c activities limitations if indicated, with other d/c instructions as indicated by MD - patient able to verbalize understanding, all questions fully answered. Patient instructed to return to ED, call 911, or call MD for any changes in condition.  Patient escorted via stretcher, and D/C to [facility] in [place] via [facility]. Report called to [facility], receiving nurse, prior to transport.  Hiram Comber, RN 11/02/2020 2:02 PM

## 2020-11-02 NOTE — TOC Transition Note (Signed)
Transition of Care [facility]) - CM/SW Discharge Note   Patient Details  Name: Shane [name] MRN: [id number] Date of Birth: 05/25/1946  Transition of Care [facility]) CM/SW Contact:  [name], [place] Phone Number: 11/02/2020, 11:23 AM   Clinical Narrative:    Patient will DC to: [facility] Anticipated DC date: 11/02/20 Family notified: [name], brother Transport by: [name] pending covid results   Per MD patient ready for DC to [facility]. RN to call report prior to discharge [phone] Room 104). RN, patient, patient's family, and facility notified of DC. Discharge Summary and FL2 sent to facility. DC packet on chart. Ambulance transport requested for patient.   CSW will sign off for now as social work intervention is no longer needed. Please consult [place] again if new needs arise.      Final next level of care: Skilled Nursing Facility Barriers to Discharge: Barriers Resolved   Patient Goals and CMS Choice Patient states their goals for this hospitalization and ongoing recovery are:: Feel better CMS Medicare.gov Compare Post Acute Care list provided to:: Patient Choice offered to / list presented to : Patient  Discharge Placement   Existing PASRR number confirmed : 11/02/20          Patient chooses bed at: [facility] Patient to be transferred to facility by: [name] Name of family member notified: Brother Patient and family notified of of transfer: 11/02/20  Discharge Plan and Services In-house Referral: Clinical Social Work   Post Acute Care Choice: [place]                               Social Determinants of Health (SDOH) Interventions     Readmission Risk Interventions No flowsheet data found.

## 2020-11-03 DIAGNOSIS — R278 Other lack of coordination: Secondary | ICD-10-CM | POA: Diagnosis not present

## 2020-11-03 DIAGNOSIS — G9341 Metabolic encephalopathy: Secondary | ICD-10-CM | POA: Diagnosis not present

## 2020-11-03 DIAGNOSIS — N179 Acute kidney failure, unspecified: Secondary | ICD-10-CM | POA: Diagnosis not present

## 2020-11-03 DIAGNOSIS — R2681 Unsteadiness on feet: Secondary | ICD-10-CM | POA: Diagnosis not present

## 2020-11-03 DIAGNOSIS — M6281 Muscle weakness (generalized): Secondary | ICD-10-CM | POA: Diagnosis not present

## 2020-11-03 DIAGNOSIS — A4151 Sepsis due to Escherichia coli [E. coli]: Secondary | ICD-10-CM | POA: Diagnosis not present

## 2020-11-03 DIAGNOSIS — R488 Other symbolic dysfunctions: Secondary | ICD-10-CM | POA: Diagnosis not present

## 2020-11-06 DIAGNOSIS — M1711 Unilateral primary osteoarthritis, right knee: Secondary | ICD-10-CM | POA: Diagnosis not present

## 2020-11-06 DIAGNOSIS — M1712 Unilateral primary osteoarthritis, left knee: Secondary | ICD-10-CM | POA: Diagnosis not present

## 2020-11-15 DIAGNOSIS — N401 Enlarged prostate with lower urinary tract symptoms: Secondary | ICD-10-CM | POA: Diagnosis not present

## 2020-11-15 DIAGNOSIS — R338 Other retention of urine: Secondary | ICD-10-CM | POA: Diagnosis not present

## 2020-11-22 DIAGNOSIS — N319 Neuromuscular dysfunction of bladder, unspecified: Secondary | ICD-10-CM | POA: Diagnosis not present

## 2020-11-29 DIAGNOSIS — R278 Other lack of coordination: Secondary | ICD-10-CM | POA: Diagnosis not present

## 2020-11-29 DIAGNOSIS — N179 Acute kidney failure, unspecified: Secondary | ICD-10-CM | POA: Diagnosis not present

## 2020-11-29 DIAGNOSIS — M6281 Muscle weakness (generalized): Secondary | ICD-10-CM | POA: Diagnosis not present

## 2020-11-29 DIAGNOSIS — A4151 Sepsis due to Escherichia coli [E. coli]: Secondary | ICD-10-CM | POA: Diagnosis not present

## 2020-11-29 DIAGNOSIS — R2681 Unsteadiness on feet: Secondary | ICD-10-CM | POA: Diagnosis not present

## 2020-11-29 DIAGNOSIS — R488 Other symbolic dysfunctions: Secondary | ICD-10-CM | POA: Diagnosis not present

## 2020-11-29 DIAGNOSIS — G9341 Metabolic encephalopathy: Secondary | ICD-10-CM | POA: Diagnosis not present

## 2020-12-06 DIAGNOSIS — R338 Other retention of urine: Secondary | ICD-10-CM | POA: Diagnosis not present

## 2020-12-07 DIAGNOSIS — I82511 Chronic embolism and thrombosis of right femoral vein: Secondary | ICD-10-CM | POA: Diagnosis not present

## 2020-12-07 DIAGNOSIS — E089 Diabetes mellitus due to underlying condition without complications: Secondary | ICD-10-CM | POA: Diagnosis not present

## 2020-12-07 DIAGNOSIS — M6281 Muscle weakness (generalized): Secondary | ICD-10-CM | POA: Diagnosis not present

## 2020-12-08 ENCOUNTER — Encounter (HOSPITAL_COMMUNITY): Payer: Self-pay | Admitting: *Deleted

## 2020-12-08 ENCOUNTER — Other Ambulatory Visit: Payer: Self-pay

## 2020-12-08 ENCOUNTER — Emergency Department (HOSPITAL_COMMUNITY)
Admission: EM | Admit: 2020-12-08 | Discharge: 2020-12-09 | Disposition: A | Discharge status: Home / Self Care | Payer: PPO | Attending: Emergency Medicine | Admitting: Emergency Medicine

## 2020-12-08 DIAGNOSIS — R300 Dysuria: Secondary | ICD-10-CM | POA: Diagnosis not present

## 2020-12-08 DIAGNOSIS — Z7901 Long term (current) use of anticoagulants: Secondary | ICD-10-CM | POA: Diagnosis not present

## 2020-12-08 DIAGNOSIS — Z87891 Personal history of nicotine dependence: Secondary | ICD-10-CM | POA: Diagnosis not present

## 2020-12-08 DIAGNOSIS — R319 Hematuria, unspecified: Secondary | ICD-10-CM | POA: Diagnosis not present

## 2020-12-08 DIAGNOSIS — J4540 Moderate persistent asthma, uncomplicated: Secondary | ICD-10-CM | POA: Diagnosis not present

## 2020-12-08 DIAGNOSIS — R339 Retention of urine, unspecified: Secondary | ICD-10-CM | POA: Diagnosis not present

## 2020-12-08 DIAGNOSIS — E1122 Type 2 diabetes mellitus with diabetic chronic kidney disease: Secondary | ICD-10-CM | POA: Diagnosis not present

## 2020-12-08 DIAGNOSIS — R0902 Hypoxemia: Secondary | ICD-10-CM | POA: Diagnosis not present

## 2020-12-08 DIAGNOSIS — Z7952 Long term (current) use of systemic steroids: Secondary | ICD-10-CM | POA: Diagnosis not present

## 2020-12-08 DIAGNOSIS — N183 Chronic kidney disease, stage 3 unspecified: Secondary | ICD-10-CM | POA: Insufficient documentation

## 2020-12-08 DIAGNOSIS — Z79899 Other long term (current) drug therapy: Secondary | ICD-10-CM | POA: Insufficient documentation

## 2020-12-08 DIAGNOSIS — I129 Hypertensive chronic kidney disease with stage 1 through stage 4 chronic kidney disease, or unspecified chronic kidney disease: Secondary | ICD-10-CM | POA: Diagnosis not present

## 2020-12-08 LAB — CBC WITH DIFFERENTIAL/PLATELET
Abs Immature Granulocytes: 0 10*3/uL (ref 0.00–0.07)
Basophils Absolute: 0 10*3/uL (ref 0.0–0.1)
Basophils Relative: 0 %
Eosinophils Absolute: 0.1 10*3/uL (ref 0.0–0.5)
Eosinophils Relative: 1 %
HCT: 29.8 % — ABNORMAL LOW (ref 39.0–52.0)
Hemoglobin: 8.9 g/dL — ABNORMAL LOW (ref 13.0–17.0)
Lymphocytes Relative: 11 %
Lymphs Abs: 0.7 10*3/uL (ref 0.7–4.0)
MCH: 19.3 pg — ABNORMAL LOW (ref 26.0–34.0)
MCHC: 29.9 g/dL — ABNORMAL LOW (ref 30.0–36.0)
MCV: 64.6 fL — ABNORMAL LOW (ref 80.0–100.0)
Monocytes Absolute: 0.4 10*3/uL (ref 0.1–1.0)
Monocytes Relative: 6 %
Neutro Abs: 4.9 10*3/uL (ref 1.7–7.7)
Neutrophils Relative %: 82 %
Platelets: 193 10*3/uL (ref 150–400)
RBC: 4.61 MIL/uL (ref 4.22–5.81)
RDW: 23.4 % — ABNORMAL HIGH (ref 11.5–15.5)
WBC: 6 10*3/uL (ref 4.0–10.5)
nRBC: 0 % (ref 0.0–0.2)
nRBC: 0 /100 WBC

## 2020-12-08 LAB — URINALYSIS, ROUTINE W REFLEX MICROSCOPIC
Bilirubin Urine: NEGATIVE
Glucose, UA: NEGATIVE mg/dL
Hgb urine dipstick: NEGATIVE
Ketones, ur: NEGATIVE mg/dL
Leukocytes,Ua: NEGATIVE
Nitrite: POSITIVE — AB
Protein, ur: NEGATIVE mg/dL
Specific Gravity, Urine: 1.01 (ref 1.005–1.030)
pH: 6 (ref 5.0–8.0)

## 2020-12-08 LAB — BASIC METABOLIC PANEL
Anion gap: 8 (ref 5–15)
BUN: 8 mg/dL (ref 8–23)
CO2: 26 mmol/L (ref 22–32)
Calcium: 8.5 mg/dL — ABNORMAL LOW (ref 8.9–10.3)
Chloride: 105 mmol/L (ref 98–111)
Creatinine, Ser: 0.93 mg/dL (ref 0.61–1.24)
GFR, Estimated: >60 mL/min (ref >60)
Glucose, Bld: 104 mg/dL — ABNORMAL HIGH (ref 70–99)
Potassium: 3.6 mmol/L (ref 3.5–5.1)
Sodium: 139 mmol/L (ref 135–145)

## 2020-12-08 MED ORDER — SULFAMETHOXAZOLE-TRIMETHOPRIM 800-160 MG PO TABS: 1.0000 | ORAL_TABLET | Freq: Two times a day (BID) | ORAL | 0 refills | Status: AC

## 2020-12-08 NOTE — ED Notes (Signed)
Pt was given discharge instructions. Unit secretary notified pt will need PTAR for transport, due to unable to ambulate.

## 2020-12-08 NOTE — Discharge Instructions (Addendum)
You have been seen and discharged from the emergency department.  You showed no signs of urinary retention.  Your urine had nitrate positive and some bacteria, you are being treated for possible urinary tract infection.  Take antibiotic as directed.  Urine culture has been sent and we will call you if results are abnormal.  Follow-up with your primary provider for reevaluation and further care. Take home medications as prescribed. If you have any worsening symptoms or further concerns for your health please return to an emergency department for further evaluation.

## 2020-12-08 NOTE — ED Triage Notes (Signed)
Patient presents to ed via [facility] from [facility], states he refused to allow them to put in a foley, MD wanted him sent to the ED for bladder scan. . Patient  states  he doesn't want a foley because they keep taking it out and pulling it back in. Bladder scanned upon arrival 65 cc.

## 2020-12-08 NOTE — ED Notes (Signed)
PTAR was called couple a head of him

## 2020-12-08 NOTE — ED Provider Notes (Signed)
[facility] EMERGENCY DEPARTMENT Provider Note   CSN: [id number] Arrival date & time: 12/08/20  1249     History Chief Complaint  Patient presents with   Urinary Retention    Shane [name] is a 75 y.o. male.  HPI  75 year old male with past medical history of DVT anticoagulated on Eliquis, chronic anemia, urinary issues presents the emergency department from nursing home for concern of urinary retention/hematuria.  Patient is not a great historian but he states that they keep recommending to place and remove a Foley because the patient symptoms of difficulty urinating.  He admits that when he urinates he has dysuria and hematuria.  Reportedly the patient was sent to the facility for bladder scan to rule out urinary retention.  Patient at this time denies any abdominal/back pain.  Denies any other acute illness.  Past Medical History:  Diagnosis Date   Acute asthmatic bronchitis    Anemia    Anxiety disorder    Borderline diabetes mellitus    Borderline hypertension    Chronic back pain    Degenerative joint disease    Depression    GERD (gastroesophageal reflux disease)    History of adenomatous polyp of colon    History of sinusitis    Posttraumatic stress disorder    Sleep apnea    Uses CPAP    Patient Active Problem List   Diagnosis Date Noted   Gastritis and gastroduodenitis    Gastric ulcer with hemorrhage    Adenomatous duodenal polyp    Hiatal hernia    Heme positive stool    Acute blood loss anemia    Sepsis with acute renal failure and septic shock ([place]) 10/26/2020   DVT (deep venous thrombosis) ([place]) 09/08/2020   Bilateral leg pain 09/05/2020   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 ([place]) 09/05/2020   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 09/05/2020   Poor tolerance for ambulation 09/05/2020   Effusion of right knee 09/05/2020   Osteoarthritis of right knee 09/05/2020    Essential hypertension 09/05/2020   Noncompliance 09/05/2020   Homelessness 09/05/2020   Acquired thrombophilia ([place]) 09/05/2020   Poor personal hygiene 06/18/2020   Stage 3a chronic kidney disease ([place]) 06/18/2020   Diet-controlled diabetes mellitus ([place]) 06/18/2020   Class 2 obesity due to excess calories with body mass index (BMI) of 38.0 to 38.9 in adult 06/18/2020   Back pain 06/18/2020   Leg swelling 06/04/2020   Educated about COVID-19 virus infection 02/03/2020   Precordial chest pain 02/03/2020   ACS (acute coronary syndrome) ([place]) 12/26/2019   AKI (acute kidney injury) ([place]) 12/26/2019   Moderate persistent asthma 12/25/2019   Right femoral vein DVT ([place]) 03/01/2019   Bradycardia 07/30/2018   Sleep apnea 07/30/2018   Murmur 04/30/2018   Dizziness 04/30/2018   Palpitations 04/06/2016   Elevated blood pressure reading without diagnosis of hypertension 01/12/2016   Sinusitis, chronic 12/19/2015   Pain in joint, upper arm 11/23/2015   Numbness and tingling in left arm 11/23/2015   Asthmatic bronchitis 02/21/2015   PTSD (post-traumatic stress disorder) 02/21/2015   Hemorrhoids 06/15/2014   Microcytic anemia 03/31/2008   GERD 03/31/2008   Impaired fasting glucose 03/31/2008    Past Surgical History:  Procedure Laterality Date   BIOPSY  10/31/2020   Procedure: BIOPSY;  Surgeon: Lavena Bullion, DO;  Location: [facility];  Service: Gastroenterology;;   COLONOSCOPY     ESOPHAGOGASTRODUODENOSCOPY (EGD) WITH PROPOFOL N/A 10/31/2020  Procedure: ESOPHAGOGASTRODUODENOSCOPY (EGD) WITH PROPOFO;  Surgeon: Lavena Bullion, DO;  Location: [facility];  Service: Gastroenterology;  Laterality: N/A;   FINGER SURGERY Right    index   HEMOSTASIS CLIP PLACEMENT  10/31/2020   Procedure: HEMOSTASIS CLIP PLACEMENT;  Surgeon: Lavena Bullion, DO;  Location: [facility];  Service: Gastroenterology;;   NASAL SINUS SURGERY     x 2   POLYPECTOMY     POLYPECTOMY  10/31/2020   Procedure:  POLYPECTOMY;  Surgeon: Lavena Bullion, DO;  Location: [facility];  Service: Gastroenterology;;       Family History  Problem Relation Age of Onset   Diabetes Father    Arthritis Mother        Unknown cause of death   Colon cancer Neg Hx    Colon polyps Neg Hx    Rectal cancer Neg Hx    Stomach cancer Neg Hx    Allergic rhinitis Neg Hx    Angioedema Neg Hx    Asthma Neg Hx    Eczema Neg Hx    Immunodeficiency Neg Hx    Urticaria Neg Hx     Social History   Tobacco Use   Smoking status: Former    Packs/day: 0.50    Years: 25.00    Pack years: 12.50    Types: Cigarettes    Quit date: 07/02/1983    Years since quitting: 37.4   Smokeless tobacco: Never  Vaping Use   Vaping Use: Never used  Substance Use Topics   Alcohol use: No   Drug use: No    Home Medications Prior to Admission medications   Medication Sig Start Date End Date Taking? Authorizing Provider  albuterol (VENTOLIN HFA) 108 (90 Base) MCG/ACT inhaler INHALE TWO PUFFS EVERY FOUR TO SIX HOURS AS NEEDED FOR COUGH OR WHEEZE. Patient taking differently: Inhale 2 puffs into the lungs every 4 (four) hours as needed for wheezing (cough). 04/03/20   Kozlow, Donnamarie Poag, MD  alum & mag hydroxide-simeth [facility] MAXIMUM STRENGTH) 400-400-40 MG/5ML suspension Take 5 mLs by mouth every 6 (six) hours as needed (abdominal pain or indigestion). 06/10/19   Carmin Muskrat, MD  apixaban (ELIQUIS) 5 MG TABS tablet Take 1 tablet (5 mg total) by mouth 2 (two) times daily. 11/03/20   Elgergawy, Silver Huguenin, MD  chlorhexidine (PERIDEX) 0.12 % solution 5 mLs by Mouth Rinse route 2 (two) times daily. 10/24/20   [provider]  docusate sodium (COLACE) 100 MG capsule Take 1 capsule (100 mg total) by mouth 2 (two) times daily. 11/02/20   Elgergawy, Silver Huguenin, MD  ferrous sulfate 325 (65 FE) MG tablet Take 1 tablet (325 mg total) by mouth 3 (three) times daily with meals. 11/02/20   Elgergawy, Silver Huguenin, MD  fluticasone-salmeterol (ADVAIR)  500-50 MCG/ACT AEPB Inhale 1 puff into the lungs in the morning and at bedtime.    [provider]  furosemide (LASIX) 40 MG tablet Take 0.5 tablets (20 mg total) by mouth every other day. 11/02/20   Elgergawy, Silver Huguenin, MD  HYDROcodone-acetaminophen (NORCO) 5-325 MG tablet Take 1 tablet by mouth every 6 (six) hours as needed for moderate pain. 11/02/20   Elgergawy, Silver Huguenin, MD  metoprolol tartrate (LOPRESSOR) 25 MG tablet Take 1 tablet (25 mg total) by mouth 2 (two) times daily. 11/02/20   Elgergawy, Silver Huguenin, MD  montelukast (SINGULAIR) 10 MG tablet TAKE 1 TABLET BY MOUTH EVERYDAY AT BEDTIME Patient taking differently: Take 10 mg by mouth at bedtime. 09/09/19  Kozlow, Donnamarie Poag, MD  pantoprazole (PROTONIX) 40 MG tablet Take 1 tablet (40 mg total) by mouth 2 (two) times daily. 11/02/20   Elgergawy, Silver Huguenin, MD  phenazopyridine (PYRIDIUM) 100 MG tablet Take 100 mg by mouth 3 (three) times daily with meals. For pain    [provider]  polyethylene glycol (MIRALAX / GLYCOLAX) 17 g packet Take 17 g by mouth daily as needed for moderate constipation. 11/02/20   Elgergawy, Silver Huguenin, MD  Propylene Glycol (SYSTANE BALANCE OP) Place 1 drop into both eyes daily as needed (for dry eyes).    [provider]  rosuvastatin (CRESTOR) 10 MG tablet Take 1 tablet (10 mg total) by mouth daily. 11/03/20   Elgergawy, Silver Huguenin, MD  tamsulosin (FLOMAX) 0.4 MG CAPS capsule Take 0.4 mg by mouth daily. 10/25/20   [provider]  triamcinolone (NASACORT ALLERGY 24HR) 55 MCG/ACT AERO nasal inhaler Use one spray in each nostril once daily as directed. Patient taking differently: Place 1 spray into the nose daily. 01/28/17   Kozlow, Donnamarie Poag, MD    Allergies    Patient has no known allergies.  Review of Systems   Review of Systems  Constitutional:  Negative for chills and fever.  HENT:  Negative for congestion.   Eyes:  Negative for visual disturbance.  Respiratory:  Negative for shortness of breath.    Cardiovascular:  Negative for chest pain.  Gastrointestinal:  Negative for abdominal pain, diarrhea and vomiting.  Genitourinary:  Positive for dysuria and hematuria.  Skin:  Negative for rash.  Neurological:  Negative for headaches.   Physical Exam Updated Vital Signs BP (!) 114/51 (BP Location: Right Arm)   Pulse (!) 58   Temp 98.5 F (36.9 C) (Oral)   Resp 18   Ht 6\' 2"  (1.88 m)   Wt 121 kg   SpO2 100%   BMI 34.25 kg/m   Physical Exam Vitals and nursing note reviewed.  Constitutional:      Appearance: Normal appearance.  HENT:     Head: Normocephalic.     Mouth/Throat:     Mouth: Mucous membranes are moist.  Cardiovascular:     Rate and Rhythm: Normal rate.  Pulmonary:     Effort: Pulmonary effort is normal. No respiratory distress.  Abdominal:     Palpations: Abdomen is soft.     Tenderness: There is no abdominal tenderness.  Skin:    General: Skin is warm.  Neurological:     Mental Status: He is alert and oriented to person, place, and time.     Comments: Poor historian and seems slow to respond at times but this is apparently baseline  Psychiatric:        Mood and Affect: Mood normal.    ED Results / Procedures / Treatments   Labs (all labs ordered are listed, but only abnormal results are displayed) Labs Reviewed  URINALYSIS, ROUTINE W REFLEX MICROSCOPIC  CBC WITH DIFFERENTIAL/PLATELET  BASIC METABOLIC PANEL    EKG None  Radiology No results found.  Procedures Procedures   Medications Ordered in ED Medications - No data to display  ED Course  I have reviewed the triage vital signs and the nursing notes.  Pertinent labs & imaging results that were available during my care of the patient were reviewed by me and considered in my medical decision making (see chart for details).    MDM Rules/Calculators/A&P  75 year old male presents from nursing facility for concern of urinary retention, hematuria and dysuria.   Bladder scan shows less than 70 cc of urine.  Patient was able to give a urine sample.  No signs of urinary retention.  Urinalysis is positive for nitrate and some bacteria, no blood.  Given the patient's complaint of dysuria and these findings we will treat him preoperatively for a possible UTI, urine culture sent.  Blood work otherwise shows a baseline anemia and normal kidney function.  Patient will be discharged back to his facility for further care.  Patient will be discharged and treated as an outpatient.  Discharge plan and strict return to ED precautions discussed, patient verbalizes understanding and agreement.  Final Clinical Impression(s) / ED Diagnoses Final diagnoses:  None    Rx / DC Orders ED Discharge Orders     None        Lorelle Gibbs, DO 12/08/20 1559

## 2020-12-09 DIAGNOSIS — R531 Weakness: Secondary | ICD-10-CM | POA: Diagnosis not present

## 2020-12-09 DIAGNOSIS — Z743 Need for continuous supervision: Secondary | ICD-10-CM | POA: Diagnosis not present

## 2020-12-29 DIAGNOSIS — M6281 Muscle weakness (generalized): Secondary | ICD-10-CM | POA: Diagnosis not present

## 2020-12-29 DIAGNOSIS — R2681 Unsteadiness on feet: Secondary | ICD-10-CM | POA: Diagnosis not present

## 2020-12-29 DIAGNOSIS — A4151 Sepsis due to Escherichia coli [E. coli]: Secondary | ICD-10-CM | POA: Diagnosis not present

## 2020-12-29 DIAGNOSIS — N179 Acute kidney failure, unspecified: Secondary | ICD-10-CM | POA: Diagnosis not present

## 2020-12-29 DIAGNOSIS — R488 Other symbolic dysfunctions: Secondary | ICD-10-CM | POA: Diagnosis not present

## 2020-12-29 DIAGNOSIS — G9341 Metabolic encephalopathy: Secondary | ICD-10-CM | POA: Diagnosis not present

## 2020-12-29 DIAGNOSIS — R278 Other lack of coordination: Secondary | ICD-10-CM | POA: Diagnosis not present

## 2021-01-29 DIAGNOSIS — G9341 Metabolic encephalopathy: Secondary | ICD-10-CM | POA: Diagnosis not present

## 2021-01-29 DIAGNOSIS — A4151 Sepsis due to Escherichia coli [E. coli]: Secondary | ICD-10-CM | POA: Diagnosis not present

## 2021-01-29 DIAGNOSIS — R2681 Unsteadiness on feet: Secondary | ICD-10-CM | POA: Diagnosis not present

## 2021-01-29 DIAGNOSIS — R488 Other symbolic dysfunctions: Secondary | ICD-10-CM | POA: Diagnosis not present

## 2021-01-29 DIAGNOSIS — N179 Acute kidney failure, unspecified: Secondary | ICD-10-CM | POA: Diagnosis not present

## 2021-01-29 DIAGNOSIS — R278 Other lack of coordination: Secondary | ICD-10-CM | POA: Diagnosis not present

## 2021-01-29 DIAGNOSIS — M6281 Muscle weakness (generalized): Secondary | ICD-10-CM | POA: Diagnosis not present

## 2021-02-05 DIAGNOSIS — I7091 Generalized atherosclerosis: Secondary | ICD-10-CM | POA: Diagnosis not present

## 2021-02-05 DIAGNOSIS — B351 Tinea unguium: Secondary | ICD-10-CM | POA: Diagnosis not present

## 2021-02-05 DIAGNOSIS — N189 Chronic kidney disease, unspecified: Secondary | ICD-10-CM | POA: Diagnosis not present

## 2021-02-23 DIAGNOSIS — H524 Presbyopia: Secondary | ICD-10-CM | POA: Diagnosis not present

## 2021-02-23 DIAGNOSIS — H18413 Arcus senilis, bilateral: Secondary | ICD-10-CM | POA: Diagnosis not present

## 2021-02-23 DIAGNOSIS — H25813 Combined forms of age-related cataract, bilateral: Secondary | ICD-10-CM | POA: Diagnosis not present

## 2021-03-01 DIAGNOSIS — G9341 Metabolic encephalopathy: Secondary | ICD-10-CM | POA: Diagnosis not present

## 2021-03-01 DIAGNOSIS — M6281 Muscle weakness (generalized): Secondary | ICD-10-CM | POA: Diagnosis not present

## 2021-03-01 DIAGNOSIS — R278 Other lack of coordination: Secondary | ICD-10-CM | POA: Diagnosis not present

## 2021-03-01 DIAGNOSIS — N179 Acute kidney failure, unspecified: Secondary | ICD-10-CM | POA: Diagnosis not present

## 2021-03-01 DIAGNOSIS — R2681 Unsteadiness on feet: Secondary | ICD-10-CM | POA: Diagnosis not present

## 2021-03-01 DIAGNOSIS — A4151 Sepsis due to Escherichia coli [E. coli]: Secondary | ICD-10-CM | POA: Diagnosis not present

## 2021-03-08 DIAGNOSIS — R3914 Feeling of incomplete bladder emptying: Secondary | ICD-10-CM | POA: Diagnosis not present

## 2021-03-08 DIAGNOSIS — N401 Enlarged prostate with lower urinary tract symptoms: Secondary | ICD-10-CM | POA: Diagnosis not present

## 2021-03-13 DIAGNOSIS — E0801 Diabetes mellitus due to underlying condition with hyperosmolarity with coma: Secondary | ICD-10-CM | POA: Diagnosis not present

## 2021-03-13 DIAGNOSIS — R0602 Shortness of breath: Secondary | ICD-10-CM | POA: Diagnosis not present

## 2021-03-13 DIAGNOSIS — I10 Essential (primary) hypertension: Secondary | ICD-10-CM | POA: Diagnosis not present

## 2021-05-01 DIAGNOSIS — R0602 Shortness of breath: Secondary | ICD-10-CM | POA: Diagnosis not present

## 2021-05-01 DIAGNOSIS — I10 Essential (primary) hypertension: Secondary | ICD-10-CM | POA: Diagnosis not present

## 2021-05-01 DIAGNOSIS — R0609 Other forms of dyspnea: Secondary | ICD-10-CM | POA: Diagnosis not present

## 2021-05-01 DIAGNOSIS — R609 Edema, unspecified: Secondary | ICD-10-CM | POA: Diagnosis not present

## 2021-05-01 DIAGNOSIS — E7849 Other hyperlipidemia: Secondary | ICD-10-CM | POA: Diagnosis not present

## 2021-05-21 ENCOUNTER — Emergency Department (HOSPITAL_COMMUNITY): Payer: PPO

## 2021-05-21 ENCOUNTER — Inpatient Hospital Stay (HOSPITAL_COMMUNITY)
Admission: EM | Admit: 2021-05-21 | Discharge: 2021-05-30 | DRG: 176 | Disposition: A | Discharge status: Skilled Nursing Facility | Payer: PPO | Attending: Family Medicine | Admitting: Family Medicine

## 2021-05-21 ENCOUNTER — Other Ambulatory Visit: Payer: Self-pay

## 2021-05-21 ENCOUNTER — Encounter (HOSPITAL_COMMUNITY): Payer: Self-pay | Admitting: Emergency Medicine

## 2021-05-21 DIAGNOSIS — I2699 Other pulmonary embolism without acute cor pulmonale: Secondary | ICD-10-CM | POA: Diagnosis not present

## 2021-05-21 DIAGNOSIS — Z8616 Personal history of COVID-19: Secondary | ICD-10-CM

## 2021-05-21 DIAGNOSIS — U071 COVID-19: Secondary | ICD-10-CM | POA: Diagnosis present

## 2021-05-21 DIAGNOSIS — I517 Cardiomegaly: Secondary | ICD-10-CM | POA: Diagnosis not present

## 2021-05-21 DIAGNOSIS — T45516A Underdosing of anticoagulants, initial encounter: Secondary | ICD-10-CM | POA: Diagnosis present

## 2021-05-21 DIAGNOSIS — N400 Enlarged prostate without lower urinary tract symptoms: Secondary | ICD-10-CM | POA: Diagnosis not present

## 2021-05-21 DIAGNOSIS — Z86010 Personal history of colonic polyps: Secondary | ICD-10-CM

## 2021-05-21 DIAGNOSIS — Z8719 Personal history of other diseases of the digestive system: Secondary | ICD-10-CM | POA: Diagnosis not present

## 2021-05-21 DIAGNOSIS — G4733 Obstructive sleep apnea (adult) (pediatric): Secondary | ICD-10-CM | POA: Diagnosis not present

## 2021-05-21 DIAGNOSIS — K219 Gastro-esophageal reflux disease without esophagitis: Secondary | ICD-10-CM | POA: Diagnosis present

## 2021-05-21 DIAGNOSIS — Z86718 Personal history of other venous thrombosis and embolism: Secondary | ICD-10-CM | POA: Diagnosis not present

## 2021-05-21 DIAGNOSIS — I2609 Other pulmonary embolism with acute cor pulmonale: Secondary | ICD-10-CM | POA: Diagnosis not present

## 2021-05-21 DIAGNOSIS — R1311 Dysphagia, oral phase: Secondary | ICD-10-CM | POA: Diagnosis not present

## 2021-05-21 DIAGNOSIS — R079 Chest pain, unspecified: Secondary | ICD-10-CM | POA: Diagnosis not present

## 2021-05-21 DIAGNOSIS — J45909 Unspecified asthma, uncomplicated: Secondary | ICD-10-CM | POA: Diagnosis not present

## 2021-05-21 DIAGNOSIS — Z7401 Bed confinement status: Secondary | ICD-10-CM | POA: Diagnosis not present

## 2021-05-21 DIAGNOSIS — Z6834 Body mass index (BMI) 34.0-34.9, adult: Secondary | ICD-10-CM | POA: Diagnosis not present

## 2021-05-21 DIAGNOSIS — R946 Abnormal results of thyroid function studies: Secondary | ICD-10-CM | POA: Diagnosis present

## 2021-05-21 DIAGNOSIS — Z66 Do not resuscitate: Secondary | ICD-10-CM | POA: Diagnosis not present

## 2021-05-21 DIAGNOSIS — I10 Essential (primary) hypertension: Secondary | ICD-10-CM | POA: Diagnosis present

## 2021-05-21 DIAGNOSIS — Z79899 Other long term (current) drug therapy: Secondary | ICD-10-CM | POA: Diagnosis not present

## 2021-05-21 DIAGNOSIS — R262 Difficulty in walking, not elsewhere classified: Secondary | ICD-10-CM | POA: Diagnosis not present

## 2021-05-21 DIAGNOSIS — N179 Acute kidney failure, unspecified: Secondary | ICD-10-CM | POA: Diagnosis present

## 2021-05-21 DIAGNOSIS — I714 Abdominal aortic aneurysm, without rupture, unspecified: Secondary | ICD-10-CM | POA: Diagnosis present

## 2021-05-21 DIAGNOSIS — R41841 Cognitive communication deficit: Secondary | ICD-10-CM | POA: Diagnosis not present

## 2021-05-21 DIAGNOSIS — Z7901 Long term (current) use of anticoagulants: Secondary | ICD-10-CM | POA: Diagnosis not present

## 2021-05-21 DIAGNOSIS — G3184 Mild cognitive impairment, so stated: Secondary | ICD-10-CM | POA: Diagnosis present

## 2021-05-21 DIAGNOSIS — Z7951 Long term (current) use of inhaled steroids: Secondary | ICD-10-CM | POA: Diagnosis not present

## 2021-05-21 DIAGNOSIS — R0789 Other chest pain: Secondary | ICD-10-CM | POA: Diagnosis not present

## 2021-05-21 DIAGNOSIS — E785 Hyperlipidemia, unspecified: Secondary | ICD-10-CM | POA: Diagnosis present

## 2021-05-21 DIAGNOSIS — J9811 Atelectasis: Secondary | ICD-10-CM | POA: Diagnosis not present

## 2021-05-21 DIAGNOSIS — I5032 Chronic diastolic (congestive) heart failure: Secondary | ICD-10-CM | POA: Diagnosis not present

## 2021-05-21 DIAGNOSIS — G4730 Sleep apnea, unspecified: Secondary | ICD-10-CM | POA: Diagnosis present

## 2021-05-21 DIAGNOSIS — D563 Thalassemia minor: Secondary | ICD-10-CM | POA: Diagnosis present

## 2021-05-21 DIAGNOSIS — Z87891 Personal history of nicotine dependence: Secondary | ICD-10-CM

## 2021-05-21 DIAGNOSIS — F4310 Post-traumatic stress disorder, unspecified: Secondary | ICD-10-CM | POA: Diagnosis present

## 2021-05-21 DIAGNOSIS — Z7982 Long term (current) use of aspirin: Secondary | ICD-10-CM | POA: Diagnosis not present

## 2021-05-21 DIAGNOSIS — Z91138 Patient's unintentional underdosing of medication regimen for other reason: Secondary | ICD-10-CM

## 2021-05-21 DIAGNOSIS — D509 Iron deficiency anemia, unspecified: Secondary | ICD-10-CM | POA: Diagnosis present

## 2021-05-21 DIAGNOSIS — E669 Obesity, unspecified: Secondary | ICD-10-CM | POA: Diagnosis present

## 2021-05-21 DIAGNOSIS — N1411 Contrast-induced nephropathy: Secondary | ICD-10-CM | POA: Diagnosis not present

## 2021-05-21 DIAGNOSIS — Z833 Family history of diabetes mellitus: Secondary | ICD-10-CM

## 2021-05-21 DIAGNOSIS — K449 Diaphragmatic hernia without obstruction or gangrene: Secondary | ICD-10-CM | POA: Diagnosis present

## 2021-05-21 DIAGNOSIS — R296 Repeated falls: Secondary | ICD-10-CM | POA: Diagnosis not present

## 2021-05-21 DIAGNOSIS — D62 Acute posthemorrhagic anemia: Secondary | ICD-10-CM

## 2021-05-21 DIAGNOSIS — M6281 Muscle weakness (generalized): Secondary | ICD-10-CM | POA: Diagnosis not present

## 2021-05-21 DIAGNOSIS — I440 Atrioventricular block, first degree: Secondary | ICD-10-CM | POA: Diagnosis present

## 2021-05-21 DIAGNOSIS — R911 Solitary pulmonary nodule: Secondary | ICD-10-CM | POA: Diagnosis present

## 2021-05-21 DIAGNOSIS — M6258 Muscle wasting and atrophy, not elsewhere classified, other site: Secondary | ICD-10-CM | POA: Diagnosis not present

## 2021-05-21 DIAGNOSIS — I110 Hypertensive heart disease with heart failure: Secondary | ICD-10-CM | POA: Diagnosis present

## 2021-05-21 DIAGNOSIS — R2689 Other abnormalities of gait and mobility: Secondary | ICD-10-CM | POA: Diagnosis not present

## 2021-05-21 DIAGNOSIS — R531 Weakness: Secondary | ICD-10-CM | POA: Diagnosis not present

## 2021-05-21 LAB — COMPREHENSIVE METABOLIC PANEL
ALT: 11 U/L (ref 0–44)
AST: 18 U/L (ref 15–41)
Albumin: 3.9 g/dL (ref 3.5–5.0)
Alkaline Phosphatase: 80 U/L (ref 38–126)
Anion gap: 11 (ref 5–15)
BUN: 7 mg/dL — ABNORMAL LOW (ref 8–23)
CO2: 26 mmol/L (ref 22–32)
Calcium: 9.5 mg/dL (ref 8.9–10.3)
Chloride: 101 mmol/L (ref 98–111)
Creatinine, Ser: 1.19 mg/dL (ref 0.61–1.24)
GFR, Estimated: >60 mL/min (ref >60)
Glucose, Bld: 109 mg/dL — ABNORMAL HIGH (ref 70–99)
Potassium: 3.9 mmol/L (ref 3.5–5.1)
Sodium: 138 mmol/L (ref 135–145)
Total Bilirubin: 0.8 mg/dL (ref 0.3–1.2)
Total Protein: 7.4 g/dL (ref 6.5–8.1)

## 2021-05-21 LAB — URINALYSIS, ROUTINE W REFLEX MICROSCOPIC
Bilirubin Urine: NEGATIVE
Glucose, UA: NEGATIVE mg/dL
Hgb urine dipstick: NEGATIVE
Ketones, ur: NEGATIVE mg/dL
Nitrite: NEGATIVE
Protein, ur: NEGATIVE mg/dL
Specific Gravity, Urine: 1.011 (ref 1.005–1.030)
pH: 7 (ref 5.0–8.0)

## 2021-05-21 LAB — CBC WITH DIFFERENTIAL/PLATELET
Abs Immature Granulocytes: 0.02 10*3/uL (ref 0.00–0.07)
Basophils Absolute: 0 10*3/uL (ref 0.0–0.1)
Basophils Relative: 0 %
Eosinophils Absolute: 0.1 10*3/uL (ref 0.0–0.5)
Eosinophils Relative: 2 %
HCT: 39 % (ref 39.0–52.0)
Hemoglobin: 12.1 g/dL — ABNORMAL LOW (ref 13.0–17.0)
Immature Granulocytes: 0 %
Lymphocytes Relative: 26 %
Lymphs Abs: 1.6 10*3/uL (ref 0.7–4.0)
MCH: 19.9 pg — ABNORMAL LOW (ref 26.0–34.0)
MCHC: 31 g/dL (ref 30.0–36.0)
MCV: 64 fL — ABNORMAL LOW (ref 80.0–100.0)
Monocytes Absolute: 0.5 10*3/uL (ref 0.1–1.0)
Monocytes Relative: 8 %
Neutro Abs: 3.9 10*3/uL (ref 1.7–7.7)
Neutrophils Relative %: 64 %
Platelets: 207 10*3/uL (ref 150–400)
RBC: 6.09 MIL/uL — ABNORMAL HIGH (ref 4.22–5.81)
RDW: 16.9 % — ABNORMAL HIGH (ref 11.5–15.5)
WBC: 6.2 10*3/uL (ref 4.0–10.5)
nRBC: 0 % (ref 0.0–0.2)

## 2021-05-21 LAB — BRAIN NATRIURETIC PEPTIDE: B Natriuretic Peptide: 143 pg/mL — ABNORMAL HIGH (ref 0.0–100.0)

## 2021-05-21 LAB — TROPONIN I (HIGH SENSITIVITY): Troponin I (High Sensitivity): 16 ng/L (ref <18)

## 2021-05-21 LAB — LIPASE, BLOOD: Lipase: 25 U/L (ref 11–51)

## 2021-05-21 LAB — RESP PANEL BY RT-PCR (FLU A&B, COVID) ARPGX2
Influenza A by PCR: NEGATIVE
Influenza B by PCR: NEGATIVE
SARS Coronavirus 2 by RT PCR: POSITIVE — AB

## 2021-05-21 MED ORDER — OXYCODONE-ACETAMINOPHEN 5-325 MG PO TABS
1.0000 | ORAL_TABLET | Freq: Once | ORAL | Status: AC
  Administered 2021-05-21: 1 via ORAL
  Filled 2021-05-21: qty 1

## 2021-05-21 MED ORDER — ONDANSETRON 4 MG PO TBDP
8.0000 mg | ORAL_TABLET | Freq: Once | ORAL | Status: AC
  Administered 2021-05-21: 8 mg via ORAL
  Filled 2021-05-21: qty 2

## 2021-05-21 MED ORDER — ASPIRIN 325 MG PO TABS
325.0000 mg | ORAL_TABLET | Freq: Every day | ORAL | Status: DC
  Administered 2021-05-21 – 2021-05-25 (×5): 325 mg via ORAL
  Filled 2021-05-21 (×5): qty 1

## 2021-05-21 NOTE — ED Provider Notes (Signed)
Emergency Medicine Provider Triage Evaluation Note  Shane [name] , a 75 y.o. male  was evaluated in triage.  Pt complains of chest pain and left-sided flank pain.  Chest pain started few hours ago, its constant.  Substernal and radiates to the left arm and to the back.  Associated with nausea and shortness of breath.  Also has left-sided back/flank pain.  No dysuria or hematuria.  Review of Systems  Positive: Chest pain, shortness of breath, back pain, left flank pain Negative: Syncope  Physical Exam  BP (!) 184/102   Pulse 79   Temp 98 F (36.7 C)   Resp 16   SpO2 95%  Gen:   Awake, no distress   Resp:  Normal effort  MSK:   Moves extremities without difficulty  Other:  Radial pulses 2+ bilaterally, no accessory muscle use or tachypnea.  S1-S2  Medical Decision Making  Medically screening exam initiated at 8:18 PM.  Appropriate orders placed.  De Burrs was informed that the remainder of the evaluation will be completed by another provider, this initial triage assessment does not replace that evaluation, and the importance of remaining in the ED until their evaluation is complete.  Chest pain work-up, abdominal labs due to the flank pain although this appears to be back pain   Sherrill Raring, PA-C 05/21/21 2019    Regan Lemming, MD 05/21/21 2048

## 2021-05-21 NOTE — ED Provider Notes (Signed)
[facility] EMERGENCY DEPARTMENT Provider Note   CSN: [id number] Arrival date & time: 05/21/21  2005     History Chief Complaint  Patient presents with   Chest Pain   Flank Pain    Shane [name] is a 75 y.o. male.  Patient presents to the emergency department for evaluation of left chest and back pain.  Patient reports constant pain that began earlier today.  Pain is sharp and into the shoulder blade area.  He does report he has had cough and chest congestion.  He tested positive for COVID earlier this week.   Chest Pain Associated symptoms: back pain   Flank Pain Associated symptoms include chest pain.      Past Medical History:  Diagnosis Date   Acute asthmatic bronchitis    Anemia    Anxiety disorder    Borderline diabetes mellitus    Borderline hypertension    Chronic back pain    Degenerative joint disease    Depression    GERD (gastroesophageal reflux disease)    History of adenomatous polyp of colon    History of sinusitis    Posttraumatic stress disorder    Sleep apnea    Uses CPAP    Patient Active Problem List   Diagnosis Date Noted   Gastritis and gastroduodenitis    Gastric ulcer with hemorrhage    Adenomatous duodenal polyp    Hiatal hernia    Heme positive stool    Acute blood loss anemia    Sepsis with acute renal failure and septic shock ([place]) 10/26/2020   DVT (deep venous thrombosis) ([place]) 09/08/2020   Bilateral leg pain 09/05/2020   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 ([place]) 09/05/2020   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 09/05/2020   Poor tolerance for ambulation 09/05/2020   Effusion of right knee 09/05/2020   Osteoarthritis of right knee 09/05/2020   Essential hypertension 09/05/2020   Noncompliance 09/05/2020   Homelessness 09/05/2020   Acquired thrombophilia ([place]) 09/05/2020   Poor personal hygiene 06/18/2020   Stage 3a chronic kidney disease  ([place]) 06/18/2020   Diet-controlled diabetes mellitus ([place]) 06/18/2020   Class 2 obesity due to excess calories with body mass index (BMI) of 38.0 to 38.9 in adult 06/18/2020   Back pain 06/18/2020   Leg swelling 06/04/2020   Educated about COVID-19 virus infection 02/03/2020   Precordial chest pain 02/03/2020   ACS (acute coronary syndrome) ([place]) 12/26/2019   AKI (acute kidney injury) ([place]) 12/26/2019   Moderate persistent asthma 12/25/2019   Right femoral vein DVT ([place]) 03/01/2019   Bradycardia 07/30/2018   Sleep apnea 07/30/2018   Murmur 04/30/2018   Dizziness 04/30/2018   Palpitations 04/06/2016   Elevated blood pressure reading without diagnosis of hypertension 01/12/2016   Sinusitis, chronic 12/19/2015   Pain in joint, upper arm 11/23/2015   Numbness and tingling in left arm 11/23/2015   Asthmatic bronchitis 02/21/2015   PTSD (post-traumatic stress disorder) 02/21/2015   Hemorrhoids 06/15/2014   Microcytic anemia 03/31/2008   GERD 03/31/2008   Impaired fasting glucose 03/31/2008    Past Surgical History:  Procedure Laterality Date   BIOPSY  10/31/2020   Procedure: BIOPSY;  Surgeon: Lavena Bullion, DO;  Location: [facility];  Service: Gastroenterology;;   COLONOSCOPY     ESOPHAGOGASTRODUODENOSCOPY (EGD) WITH PROPOFOL N/A 10/31/2020   Procedure: ESOPHAGOGASTRODUODENOSCOPY (EGD) WITH PROPOFO;  Surgeon: Lavena Bullion, DO;  Location: [facility];  Service: Gastroenterology;  Laterality: N/A;  FINGER SURGERY Right    index   HEMOSTASIS CLIP PLACEMENT  10/31/2020   Procedure: HEMOSTASIS CLIP PLACEMENT;  Surgeon: Lavena Bullion, DO;  Location: [facility];  Service: Gastroenterology;;   NASAL SINUS SURGERY     x 2   POLYPECTOMY     POLYPECTOMY  10/31/2020   Procedure: POLYPECTOMY;  Surgeon: Lavena Bullion, DO;  Location: [facility];  Service: Gastroenterology;;       Family History  Problem Relation Age of Onset   Diabetes Father    Arthritis Mother         Unknown cause of death   Colon cancer Neg Hx    Colon polyps Neg Hx    Rectal cancer Neg Hx    Stomach cancer Neg Hx    Allergic rhinitis Neg Hx    Angioedema Neg Hx    Asthma Neg Hx    Eczema Neg Hx    Immunodeficiency Neg Hx    Urticaria Neg Hx     Social History   Tobacco Use   Smoking status: Former    Packs/day: 0.50    Years: 25.00    Pack years: 12.50    Types: Cigarettes    Quit date: 07/02/1983    Years since quitting: 37.9   Smokeless tobacco: Never  Vaping Use   Vaping Use: Never used  Substance Use Topics   Alcohol use: No   Drug use: No    Home Medications Prior to Admission medications   Medication Sig Start Date End Date Taking? Authorizing Provider  albuterol (VENTOLIN HFA) 108 (90 Base) MCG/ACT inhaler INHALE TWO PUFFS EVERY FOUR TO SIX HOURS AS NEEDED FOR COUGH OR WHEEZE. Patient taking differently: Inhale 2 puffs into the lungs every 4 (four) hours as needed for wheezing (cough). 04/03/20   Kozlow, Donnamarie Poag, MD  alum & mag hydroxide-simeth [facility] MAXIMUM STRENGTH) 400-400-40 MG/5ML suspension Take 5 mLs by mouth every 6 (six) hours as needed (abdominal pain or indigestion). 06/10/19   Carmin Muskrat, MD  apixaban (ELIQUIS) 5 MG TABS tablet Take 1 tablet (5 mg total) by mouth 2 (two) times daily. 11/03/20   Elgergawy, Silver Huguenin, MD  chlorhexidine (PERIDEX) 0.12 % solution 5 mLs by Mouth Rinse route 2 (two) times daily. 10/24/20   [provider]  docusate sodium (COLACE) 100 MG capsule Take 1 capsule (100 mg total) by mouth 2 (two) times daily. 11/02/20   Elgergawy, Silver Huguenin, MD  ferrous sulfate 325 (65 FE) MG tablet Take 1 tablet (325 mg total) by mouth 3 (three) times daily with meals. 11/02/20   Elgergawy, Silver Huguenin, MD  fluticasone-salmeterol (ADVAIR) 500-50 MCG/ACT AEPB Inhale 1 puff into the lungs in the morning and at bedtime.    [provider]  furosemide (LASIX) 40 MG tablet Take 0.5 tablets (20 mg total) by mouth every other day.  11/02/20   Elgergawy, Silver Huguenin, MD  HYDROcodone-acetaminophen (NORCO) 5-325 MG tablet Take 1 tablet by mouth every 6 (six) hours as needed for moderate pain. 11/02/20   Elgergawy, Silver Huguenin, MD  metoprolol tartrate (LOPRESSOR) 25 MG tablet Take 1 tablet (25 mg total) by mouth 2 (two) times daily. 11/02/20   Elgergawy, Silver Huguenin, MD  montelukast (SINGULAIR) 10 MG tablet TAKE 1 TABLET BY MOUTH EVERYDAY AT BEDTIME Patient taking differently: Take 10 mg by mouth at bedtime. 09/09/19   Kozlow, Donnamarie Poag, MD  pantoprazole (PROTONIX) 40 MG tablet Take 1 tablet (40 mg total) by mouth 2 (two) times  daily. 11/02/20   Elgergawy, Silver Huguenin, MD  phenazopyridine (PYRIDIUM) 100 MG tablet Take 100 mg by mouth 3 (three) times daily with meals. For pain    [provider]  polyethylene glycol (MIRALAX / GLYCOLAX) 17 g packet Take 17 g by mouth daily as needed for moderate constipation. 11/02/20   Elgergawy, Silver Huguenin, MD  Propylene Glycol (SYSTANE BALANCE OP) Place 1 drop into both eyes daily as needed (for dry eyes).    [provider]  rosuvastatin (CRESTOR) 10 MG tablet Take 1 tablet (10 mg total) by mouth daily. 11/03/20   Elgergawy, Silver Huguenin, MD  tamsulosin (FLOMAX) 0.4 MG CAPS capsule Take 0.4 mg by mouth daily. 10/25/20   [provider]  triamcinolone (NASACORT ALLERGY 24HR) 55 MCG/ACT AERO nasal inhaler Use one spray in each nostril once daily as directed. Patient taking differently: Place 1 spray into the nose daily. 01/28/17   Kozlow, Donnamarie Poag, MD    Allergies    Patient has no known allergies.  Review of Systems   Review of Systems  Cardiovascular:  Positive for chest pain.  Musculoskeletal:  Positive for back pain.  All other systems reviewed and are negative.  Physical Exam Updated Vital Signs BP (!) 168/90 (BP Location: Right Arm)   Pulse 68   Temp 98.6 F (37 C)   Resp 16   SpO2 96%   Physical Exam Vitals and nursing note reviewed.  Constitutional:      General: He is not in  acute distress.    Appearance: Normal appearance. He is well-developed.  HENT:     Head: Normocephalic and atraumatic.     Right Ear: Hearing normal.     Left Ear: Hearing normal.     Nose: Nose normal.  Eyes:     Conjunctiva/sclera: Conjunctivae normal.     Pupils: Pupils are equal, round, and reactive to light.  Cardiovascular:     Rate and Rhythm: Regular rhythm.     Heart sounds: S1 normal and S2 normal. No murmur heard.   No friction rub. No gallop.  Pulmonary:     Effort: Pulmonary effort is normal. No respiratory distress.     Breath sounds: Normal breath sounds.  Chest:     Chest wall: No tenderness.  Abdominal:     General: Bowel sounds are normal.     Palpations: Abdomen is soft.     Tenderness: There is no abdominal tenderness. There is no guarding or rebound. Negative signs include Murphy's sign and McBurney's sign.     Hernia: No hernia is present.  Musculoskeletal:        General: Normal range of motion.     Cervical back: Normal range of motion and neck supple.  Skin:    General: Skin is warm and dry.     Findings: No rash.  Neurological:     Mental Status: He is alert and oriented to person, place, and time.     GCS: GCS eye subscore is 4. GCS verbal subscore is 5. GCS motor subscore is 6.     Cranial Nerves: No cranial nerve deficit.     Sensory: No sensory deficit.     Coordination: Coordination normal.  Psychiatric:        Speech: Speech normal.        Behavior: Behavior normal.        Thought Content: Thought content normal.    ED Results / Procedures / Treatments   Labs (all labs ordered are  listed, but only abnormal results are displayed) Labs Reviewed  RESP PANEL BY RT-PCR (FLU A&B, COVID) ARPGX2 - Abnormal; Notable for the following components:      Result Value   SARS Coronavirus 2 by RT PCR POSITIVE (*)    All other components within normal limits  CBC WITH DIFFERENTIAL/PLATELET - Abnormal; Notable for the following components:   RBC 6.09  (*)    Hemoglobin 12.1 (*)    MCV 64.0 (*)    MCH 19.9 (*)    RDW 16.9 (*)    All other components within normal limits  COMPREHENSIVE METABOLIC PANEL - Abnormal; Notable for the following components:   Glucose, Bld 109 (*)    BUN 7 (*)    All other components within normal limits  URINALYSIS, ROUTINE W REFLEX MICROSCOPIC - Abnormal; Notable for the following components:   APPearance HAZY (*)    Leukocytes,Ua TRACE (*)    Bacteria, UA RARE (*)    All other components within normal limits  BRAIN NATRIURETIC PEPTIDE - Abnormal; Notable for the following components:   B Natriuretic Peptide 143.0 (*)    All other components within normal limits  LIPASE, BLOOD  TROPONIN I (HIGH SENSITIVITY)  TROPONIN I (HIGH SENSITIVITY)    EKG EKG Interpretation  Date/Time:  Monday May 21 2021 23:48:34 EST Ventricular Rate:  61 PR Interval:  203 QRS Duration: 91 QT Interval:  450 QTC Calculation: 454 R Axis:   1 Text Interpretation: Sinus rhythm Normal ECG Confirmed by Orpah Greek [phone]) on 05/21/2021 11:57:37 PM  Radiology DG Chest 2 View  Result Date: 05/21/2021 CLINICAL DATA:  Chest pain. EXAM: CHEST - 2 VIEW COMPARISON:  Chest radiograph dated 10/26/2020. FINDINGS: Minimal bibasilar atelectasis. Right paratracheal density may represent atelectasis related to shallow inspiration or mediastinal vasculature. Developing infiltrate is not excluded. Follow-up recommended. No pleural effusion, pneumothorax. Stable cardiomegaly. Degenerative changes of the spine. No acute osseous pathology. IMPRESSION: Right apical/paratracheal density may represent atelectasis or infiltrate. Continued follow-up recommended. Electronically Signed   By: Anner Crete M.D.   On: 05/21/2021 21:52   CT Angio Chest Pulmonary Embolism (PE) W or WO Contrast  Addendum Date: 05/22/2021   ADDENDUM REPORT: 05/22/2021 02:13 ADDENDUM: Critical findings were reported to Dr. Waverly Ferrari at 2:11 a.m. Electronically  Signed   By: Brett Fairy M.D.   On: 05/22/2021 02:13   Result Date: 05/22/2021 CLINICAL DATA:  Chest and back pain. PE suspected, high probability. EXAM: CT ANGIOGRAPHY CHEST WITH CONTRAST TECHNIQUE: Multidetector CT imaging of the chest was performed using the standard protocol during bolus administration of intravenous contrast. Multiplanar CT image reconstructions and MIPs were obtained to evaluate the vascular anatomy. CONTRAST:  68mL OMNIPAQUE IOHEXOL 350 MG/ML SOLN COMPARISON:  Nov 22, 2020. FINDINGS: Cardiovascular: The heart is normal in size and there is no pericardial effusion. A few scattered coronary artery calcifications are noted. There is mild dilatation of the ascending aorta measuring up to 4.0 cm in diameter. The pulmonary trunk is distended which may be associated with underlying pulmonary artery hypertension. A few tiny subsegmental pulmonary artery filling defects are noted in the lower lobes bilaterally. The right ventricle is mildly distended which is similar in appearance to the prior exam. Mediastinum/Nodes: No enlarged mediastinal, hilar, or axillary lymph nodes. Thyroid gland, trachea, and esophagus demonstrate no significant findings. Lungs/Pleura: Mild atelectasis is noted bilaterally. There is a 3 mm ground-glass nodule in the right middle lobe, axial image 77. No effusion or pneumothorax. Upper Abdomen: There  is a small hiatal hernia. A few scattered calcifications are present in the pancreas which may be related to chronic pancreatitis. There is thickening of the left adrenal gland without evidence of discrete nodule. The right adrenal gland is within normal limits. Musculoskeletal: Gynecomastia is noted bilaterally. Degenerative changes are present in the thoracic spine. Review of the MIP images confirms the above findings. IMPRESSION: 1. Small segmental pulmonary artery emboli defects in the lower lobes bilaterally. The right ventricle is distended which is similar in  appearance to the prior exam and may be chronic, however right heart strain can not be excluded. 2. Distended pulmonary trunk which may be associated with underlying pulmonary artery hypertension. 3. Cardiomegaly with coronary artery calcifications. 4. Mild dilatation of the ascending aorta measuring up to 4.0 cm. Recommend annual imaging followup by CTA or MRA. This recommendation follows 2010 ACCF/AHA/AATS/ACR/ASA/SCA/SCAI/SIR/STS/SVM Guidelines for the Diagnosis and Management of Patients with Thoracic Aortic Disease. Circulation. 2010; 121: E174-Y814. Aortic aneurysm NOS (ICD10-I71.9). 5. Small hiatal hernia. 6. Right middle lobe pulmonary nodule, unchanged from the prior exam. Electronically Signed: By: Brett Fairy M.D. On: 05/22/2021 02:05    Procedures Procedures   Medications Ordered in ED Medications  aspirin tablet 325 mg (325 mg Oral Given 05/21/21 2115)  ondansetron (ZOFRAN-ODT) disintegrating tablet 8 mg (8 mg Oral Given 05/21/21 2116)  oxyCODONE-acetaminophen (PERCOCET/ROXICET) 5-325 MG per tablet 1 tablet (1 tablet Oral Given 05/21/21 2116)  morphine 4 MG/ML injection 4 mg (4 mg Intravenous Given 05/22/21 0111)  ondansetron (ZOFRAN) injection 4 mg (4 mg Intravenous Given 05/22/21 0111)  iohexol (OMNIPAQUE) 350 MG/ML injection 75 mL (75 mLs Intravenous Contrast Given 05/22/21 4818)    ED Course  I have reviewed the triage vital signs and the nursing notes.  Pertinent labs & imaging results that were available during my care of the patient were reviewed by me and considered in my medical decision making (see chart for details).    MDM Rules/Calculators/A&P                           Patient presents to the emergency department for evaluation of chest pain.  Patient complaining of left-sided chest pain that radiates straight through to his back.  Patient reports a history of DVT, no history of PE.  He is on Eliquis.  I do question whether or not he is compliant with his  medications.  Is a complicated patient.  He recently tested positive for COVID.  CT scan shows bilateral PE.  Right ventricle is dilated but this may be chronic, cannot rule out right heart strain.  Size of the clots, speaks against this.  Patient requiring narcotic analgesia for pain control.  He appears to have developed PEs while on Eliquis.  Will hospitalize, initiate heparin.  CRITICAL CARE Performed by: Orpah Greek   Total critical care time: 35 minutes  Critical care time was exclusive of separately billable procedures and treating other patients.  Critical care was necessary to treat or prevent imminent or life-threatening deterioration.  Critical care was time spent personally by me on the following activities: development of treatment plan with patient and/or surrogate as well as nursing, discussions with consultants, evaluation of patient's response to treatment, examination of patient, obtaining history from patient or surrogate, ordering and performing treatments and interventions, ordering and review of laboratory studies, ordering and review of radiographic studies, pulse oximetry and re-evaluation of patient's condition.  Final Clinical Impression(s) / ED Diagnoses  Final diagnoses:  Acute pulmonary embolism, unspecified pulmonary embolism type, unspecified whether acute cor pulmonale present [facility])    Rx / DC Orders ED Discharge Orders     None        Ericberto Padget, Gwenyth Allegra, MD 05/22/21 0230

## 2021-05-21 NOTE — ED Triage Notes (Signed)
Patient arrived with EMS from home reports left chest pain and left upper flank pain this week , he tested positive for Covid this week . No emesis , mild SOB .

## 2021-05-22 ENCOUNTER — Emergency Department (HOSPITAL_COMMUNITY): Payer: PPO

## 2021-05-22 ENCOUNTER — Observation Stay (HOSPITAL_COMMUNITY): Payer: PPO

## 2021-05-22 ENCOUNTER — Encounter (HOSPITAL_COMMUNITY): Payer: Self-pay | Admitting: Internal Medicine

## 2021-05-22 DIAGNOSIS — Z7901 Long term (current) use of anticoagulants: Secondary | ICD-10-CM

## 2021-05-22 DIAGNOSIS — I2609 Other pulmonary embolism with acute cor pulmonale: Secondary | ICD-10-CM

## 2021-05-22 DIAGNOSIS — Z8719 Personal history of other diseases of the digestive system: Secondary | ICD-10-CM | POA: Diagnosis not present

## 2021-05-22 DIAGNOSIS — D509 Iron deficiency anemia, unspecified: Secondary | ICD-10-CM

## 2021-05-22 DIAGNOSIS — I10 Essential (primary) hypertension: Secondary | ICD-10-CM | POA: Diagnosis not present

## 2021-05-22 DIAGNOSIS — K219 Gastro-esophageal reflux disease without esophagitis: Secondary | ICD-10-CM

## 2021-05-22 DIAGNOSIS — Z86718 Personal history of other venous thrombosis and embolism: Secondary | ICD-10-CM | POA: Diagnosis not present

## 2021-05-22 DIAGNOSIS — F4310 Post-traumatic stress disorder, unspecified: Secondary | ICD-10-CM

## 2021-05-22 DIAGNOSIS — U071 COVID-19: Secondary | ICD-10-CM | POA: Diagnosis not present

## 2021-05-22 DIAGNOSIS — I2699 Other pulmonary embolism without acute cor pulmonale: Secondary | ICD-10-CM | POA: Diagnosis not present

## 2021-05-22 DIAGNOSIS — G4730 Sleep apnea, unspecified: Secondary | ICD-10-CM | POA: Diagnosis not present

## 2021-05-22 LAB — CBC
HCT: 39.4 % (ref 39.0–52.0)
Hemoglobin: 12.2 g/dL — ABNORMAL LOW (ref 13.0–17.0)
MCH: 19.8 pg — ABNORMAL LOW (ref 26.0–34.0)
MCHC: 31 g/dL (ref 30.0–36.0)
MCV: 64 fL — ABNORMAL LOW (ref 80.0–100.0)
Platelets: 201 10*3/uL (ref 150–400)
RBC: 6.16 MIL/uL — ABNORMAL HIGH (ref 4.22–5.81)
RDW: 17.1 % — ABNORMAL HIGH (ref 11.5–15.5)
WBC: 6.5 10*3/uL (ref 4.0–10.5)
nRBC: 0 % (ref 0.0–0.2)

## 2021-05-22 LAB — HEPARIN LEVEL (UNFRACTIONATED)
Heparin Unfractionated: 0.28 IU/mL — ABNORMAL LOW (ref 0.30–0.70)
Heparin Unfractionated: 0.94 IU/mL — ABNORMAL HIGH (ref 0.30–0.70)
Heparin Unfractionated: 0.2 IU/mL — ABNORMAL LOW (ref 0.30–0.70)

## 2021-05-22 LAB — TYPE AND SCREEN
ABO/RH(D): O POS
Antibody Screen: NEGATIVE

## 2021-05-22 LAB — BRAIN NATRIURETIC PEPTIDE: B Natriuretic Peptide: 134.7 pg/mL — ABNORMAL HIGH (ref 0.0–100.0)

## 2021-05-22 LAB — FERRITIN: Ferritin: 137 ng/mL (ref 24–336)

## 2021-05-22 LAB — APTT
aPTT: 28 seconds (ref 24–36)
aPTT: 137 seconds — ABNORMAL HIGH (ref 24–36)

## 2021-05-22 LAB — PHOSPHORUS: Phosphorus: 2.8 mg/dL (ref 2.5–4.6)

## 2021-05-22 LAB — PROCALCITONIN: Procalcitonin: <0.1 ng/mL

## 2021-05-22 LAB — TROPONIN I (HIGH SENSITIVITY): Troponin I (High Sensitivity): 12 ng/L (ref <18)

## 2021-05-22 LAB — PROTIME-INR
INR: 1.3 — ABNORMAL HIGH (ref 0.8–1.2)
Prothrombin Time: 16.4 seconds — ABNORMAL HIGH (ref 11.4–15.2)

## 2021-05-22 LAB — SAVE SMEAR(SSMR), FOR PROVIDER SLIDE REVIEW

## 2021-05-22 LAB — MAGNESIUM: Magnesium: 1.8 mg/dL (ref 1.7–2.4)

## 2021-05-22 LAB — POC OCCULT BLOOD, ED: Fecal Occult Bld: NEGATIVE

## 2021-05-22 LAB — C-REACTIVE PROTEIN: CRP: 0.5 mg/dL (ref <1.0)

## 2021-05-22 MED ORDER — GUAIFENESIN-DM 100-10 MG/5ML PO SYRP: 10.0000 mL | ORAL_SOLUTION | ORAL | Status: DC | PRN

## 2021-05-22 MED ORDER — SODIUM CHLORIDE 0.9% FLUSH
3.0000 mL | Freq: Two times a day (BID) | INTRAVENOUS | Status: DC
  Administered 2021-05-22 – 2021-05-29 (×12): 3 mL via INTRAVENOUS

## 2021-05-22 MED ORDER — ASCORBIC ACID 500 MG PO TABS
500.0000 mg | ORAL_TABLET | Freq: Every day | ORAL | Status: DC
  Administered 2021-05-22 – 2021-05-29 (×8): 500 mg via ORAL
  Filled 2021-05-22 (×8): qty 1

## 2021-05-22 MED ORDER — ROSUVASTATIN CALCIUM 5 MG PO TABS
10.0000 mg | ORAL_TABLET | Freq: Every day | ORAL | Status: DC
  Administered 2021-05-22 – 2021-05-29 (×8): 10 mg via ORAL
  Filled 2021-05-22 (×8): qty 2

## 2021-05-22 MED ORDER — ACETAMINOPHEN 325 MG PO TABS: 650.0000 mg | ORAL_TABLET | Freq: Four times a day (QID) | ORAL | Status: DC | PRN

## 2021-05-22 MED ORDER — HEPARIN BOLUS VIA INFUSION
4000.0000 [IU] | Freq: Once | INTRAVENOUS | Status: AC
  Administered 2021-05-22: 4000 [IU] via INTRAVENOUS
  Filled 2021-05-22: qty 4000

## 2021-05-22 MED ORDER — MORPHINE SULFATE (PF) 4 MG/ML IV SOLN
4.0000 mg | Freq: Once | INTRAVENOUS | Status: AC
Start: 2021-05-22 — End: 2021-05-22
  Administered 2021-05-22: 4 mg via INTRAVENOUS
  Filled 2021-05-22: qty 1

## 2021-05-22 MED ORDER — FUROSEMIDE 20 MG PO TABS
20.0000 mg | ORAL_TABLET | ORAL | Status: DC
  Administered 2021-05-22: 20 mg via ORAL
  Filled 2021-05-22 (×2): qty 1

## 2021-05-22 MED ORDER — MOMETASONE FURO-FORMOTEROL FUM 200-5 MCG/ACT IN AERO
2.0000 | INHALATION_SPRAY | Freq: Two times a day (BID) | RESPIRATORY_TRACT | Status: DC
  Administered 2021-05-22 – 2021-05-29 (×13): 2 via RESPIRATORY_TRACT
  Filled 2021-05-22 (×2): qty 8.8

## 2021-05-22 MED ORDER — ONDANSETRON HCL 4 MG/2ML IJ SOLN
4.0000 mg | Freq: Once | INTRAMUSCULAR | Status: AC
  Administered 2021-05-22: 4 mg via INTRAVENOUS
  Filled 2021-05-22: qty 2

## 2021-05-22 MED ORDER — OXYCODONE HCL 5 MG PO TABS
5.0000 mg | ORAL_TABLET | Freq: Four times a day (QID) | ORAL | Status: DC | PRN
  Administered 2021-05-22 (×2): 5 mg via ORAL
  Filled 2021-05-22 (×2): qty 1

## 2021-05-22 MED ORDER — ONDANSETRON HCL 4 MG PO TABS: 4.0000 mg | ORAL_TABLET | Freq: Four times a day (QID) | ORAL | Status: DC | PRN

## 2021-05-22 MED ORDER — POLYETHYLENE GLYCOL 3350 17 G PO PACK: 17.0000 g | PACK | Freq: Every day | ORAL | Status: DC | PRN

## 2021-05-22 MED ORDER — IOHEXOL 350 MG/ML SOLN
75.0000 mL | Freq: Once | INTRAVENOUS | Status: AC | PRN
  Administered 2021-05-22: 75 mL via INTRAVENOUS

## 2021-05-22 MED ORDER — HYDROCOD POLST-CPM POLST ER 10-8 MG/5ML PO SUER: 5.0000 mL | Freq: Two times a day (BID) | ORAL | Status: DC | PRN

## 2021-05-22 MED ORDER — PANTOPRAZOLE SODIUM 40 MG PO TBEC
40.0000 mg | DELAYED_RELEASE_TABLET | Freq: Every day | ORAL | Status: DC
  Filled 2021-05-22: qty 1

## 2021-05-22 MED ORDER — ALBUTEROL SULFATE (2.5 MG/3ML) 0.083% IN NEBU: 2.5000 mg | INHALATION_SOLUTION | Freq: Four times a day (QID) | RESPIRATORY_TRACT | Status: DC | PRN

## 2021-05-22 MED ORDER — ONDANSETRON HCL 4 MG/2ML IJ SOLN: 4.0000 mg | Freq: Four times a day (QID) | INTRAMUSCULAR | Status: DC | PRN

## 2021-05-22 MED ORDER — METOPROLOL TARTRATE 25 MG PO TABS
25.0000 mg | ORAL_TABLET | Freq: Two times a day (BID) | ORAL | Status: DC
  Administered 2021-05-22 – 2021-05-23 (×3): 25 mg via ORAL
  Filled 2021-05-22 (×3): qty 1

## 2021-05-22 MED ORDER — ZINC SULFATE 220 (50 ZN) MG PO CAPS
220.0000 mg | ORAL_CAPSULE | Freq: Every day | ORAL | Status: DC
  Administered 2021-05-22 – 2021-05-29 (×8): 220 mg via ORAL
  Filled 2021-05-22 (×8): qty 1

## 2021-05-22 MED ORDER — ACETAMINOPHEN 650 MG RE SUPP: 650.0000 mg | Freq: Four times a day (QID) | RECTAL | Status: DC | PRN

## 2021-05-22 MED ORDER — HEPARIN (PORCINE) 25000 UT/250ML-% IV SOLN
1400.0000 [IU]/h | INTRAVENOUS | Status: DC
  Administered 2021-05-22: 1300 [IU]/h via INTRAVENOUS
  Administered 2021-05-23 – 2021-05-24 (×2): 1450 [IU]/h via INTRAVENOUS
  Administered 2021-05-26 (×2): 1400 [IU]/h via INTRAVENOUS
  Filled 2021-05-22 (×6): qty 250

## 2021-05-22 MED ORDER — PANTOPRAZOLE SODIUM 40 MG PO TBEC
40.0000 mg | DELAYED_RELEASE_TABLET | Freq: Two times a day (BID) | ORAL | Status: DC
  Administered 2021-05-22 – 2021-05-29 (×15): 40 mg via ORAL
  Filled 2021-05-22 (×15): qty 1

## 2021-05-22 MED ORDER — HEPARIN (PORCINE) 25000 UT/250ML-% IV SOLN
1500.0000 [IU]/h | INTRAVENOUS | Status: DC
  Administered 2021-05-22: 1500 [IU]/h via INTRAVENOUS
  Filled 2021-05-22: qty 250

## 2021-05-22 NOTE — ED Notes (Signed)
Roderic Palau in pharmacy called and said to hold heparin x1 hour after lab resulted, pump off at this time.

## 2021-05-22 NOTE — ED Notes (Signed)
Pt was changed into new gown, new linens put on bed and fresh warm blankets given. Pt A&O but drowsy. Able to stand at bedside to void in urinal. Reports generalized chronic pain, gave PO pain meds per order. Pt offered breakfast but declined. Pt on heparin gtt with no complications. No distress noted, call light in reach. Awaiting bed placement.

## 2021-05-22 NOTE — Progress Notes (Signed)
ANTICOAGULATION CONSULT NOTE   Pharmacy Consult for Heparin Indication: pulmonary embolus  No Known Allergies  Patient Measurements:   Heparin Dosing Weight: 110 kg  Vital Signs: BP: 171/102 (11/22 1230) Pulse Rate: 72 (11/22 1230)  Labs: Recent Labs    05/21/21 2034 05/21/21 2302 05/22/21 0320 05/22/21 0916 05/22/21 1311  HGB 12.1*  --   --  12.2*  --   HCT 39.0  --   --  39.4  --   PLT 207  --   --  201  --   APTT  --   --  28 137*  --   LABPROT  --   --  16.4*  --   --   INR  --   --  1.3*  --   --   HEPARINUNFRC  --   --  0.28*  --  0.94*  CREATININE 1.19  --   --   --   --   TROPONINIHS 16 12  --   --   --      CrCl cannot be calculated (Unknown ideal weight.).   Medical History: Past Medical History:  Diagnosis Date   Acute asthmatic bronchitis    Anemia    Anxiety disorder    Borderline diabetes mellitus    Borderline hypertension    Chronic back pain    Degenerative joint disease    Depression    GERD (gastroesophageal reflux disease)    History of adenomatous polyp of colon    History of sinusitis    Posttraumatic stress disorder    Sleep apnea    Uses CPAP    Medications:  No current facility-administered medications on file prior to encounter.   Current Outpatient Medications on File Prior to Encounter  Medication Sig Dispense Refill   albuterol (VENTOLIN HFA) 108 (90 Base) MCG/ACT inhaler INHALE TWO PUFFS EVERY FOUR TO SIX HOURS AS NEEDED FOR COUGH OR WHEEZE. (Patient taking differently: Inhale 2 puffs into the lungs every 4 (four) hours as needed for wheezing (cough).) 8.5 each 0   alum & mag hydroxide-simeth (MYLANTA MAXIMUM STRENGTH) 400-400-40 MG/5ML suspension Take 5 mLs by mouth every 6 (six) hours as needed (abdominal pain or indigestion). 355 mL 0   apixaban (ELIQUIS) 5 MG TABS tablet Take 1 tablet (5 mg total) by mouth 2 (two) times daily. 60 tablet 0   chlorhexidine (PERIDEX) 0.12 % solution 5 mLs by Mouth Rinse route 2 (two)  times daily.     divalproex (DEPAKOTE) 125 MG DR tablet Take 125 mg by mouth 2 (two) times daily.     docusate sodium (COLACE) 100 MG capsule Take 1 capsule (100 mg total) by mouth 2 (two) times daily. 10 capsule 0   ferrous sulfate 325 (65 FE) MG tablet Take 1 tablet (325 mg total) by mouth 3 (three) times daily with meals.  3   fluticasone-salmeterol (ADVAIR) 500-50 MCG/ACT AEPB Inhale 1 puff into the lungs in the morning and at bedtime.     furosemide (LASIX) 20 MG tablet Take 20 mg by mouth 2 (two) times daily.     furosemide (LASIX) 40 MG tablet Take 0.5 tablets (20 mg total) by mouth every other day. 30 tablet 10   HYDROcodone-acetaminophen (NORCO) 5-325 MG tablet Take 1 tablet by mouth every 6 (six) hours as needed for moderate pain. 10 tablet 0   metoprolol tartrate (LOPRESSOR) 25 MG tablet Take 1 tablet (25 mg total) by mouth 2 (two) times daily.  montelukast (SINGULAIR) 10 MG tablet TAKE 1 TABLET BY MOUTH EVERYDAY AT BEDTIME (Patient taking differently: Take 10 mg by mouth at bedtime.) 30 tablet 0   omeprazole (PRILOSEC) 20 MG capsule Take 20 mg by mouth 2 (two) times daily.     pantoprazole (PROTONIX) 40 MG tablet Take 1 tablet (40 mg total) by mouth 2 (two) times daily.     phenazopyridine (PYRIDIUM) 100 MG tablet Take 100 mg by mouth 3 (three) times daily with meals. For pain     polyethylene glycol (MIRALAX / GLYCOLAX) 17 g packet Take 17 g by mouth daily as needed for moderate constipation. 14 each 0   Propylene Glycol (SYSTANE BALANCE OP) Place 1 drop into both eyes daily as needed (for dry eyes).     rosuvastatin (CRESTOR) 10 MG tablet Take 1 tablet (10 mg total) by mouth daily.     tamsulosin (FLOMAX) 0.4 MG CAPS capsule Take 0.4 mg by mouth daily.     triamcinolone (NASACORT ALLERGY 24HR) 55 MCG/ACT AERO nasal inhaler Use one spray in each nostril once daily as directed. (Patient taking differently: Place 1 spray into the nose daily.) 1 Inhaler 5     Assessment: 75 y.o.  male with H/O DVT on Eliquis admitted with new PE, for heparin.   Heparin level and aPTT supratherapeutic on 1500 units/hr  Goal of Therapy:  Heparin level 0.3-0.7 units/ml Monitor platelets by anticoagulation protocol: Yes   Plan:  Hold heparin gtt x 1h, then restart at reduced rate 1300 units/hr F/u 6 hour heparin level  Bertis Ruddy, PharmD Clinical Pharmacist ED Pharmacist Phone # [phone] 05/22/2021 2:34 PM

## 2021-05-22 NOTE — ED Notes (Signed)
Patient transported to CT 

## 2021-05-22 NOTE — H&P (Addendum)
History and Physical    SHAFIN POLLIO TTS:[id number] DOB: [birth year] DOA: 05/21/2021  Referring MD/NP/PA: Shela Leff, MD PCP: Radford Pax, MD at [facility] hospital  Patient coming from: home via EMS  Chief Complaint: Chest pain  I have personally briefly reviewed patient's old medical records in [place]   HPI: Shane [name] is a 75 y.o. 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  Pain was described as sharp with radiation to the left shoulder shoulder and back.  He reports taking Eliquis as as prescribed.  States that he rarely misses taking the medication.  He receives all of his medications through the [facility] hospital.  Other associated symptoms included  nausea, shortness of breath, and productive cough.  Patient had been tested on 11/15 at the [facility] hospital and was found to be positive for COVID-19.  At that time he was not prescribed any medications.  Over the phone his brother states that they have been in the process of trying to get him into an assisted living facility after patient left [facility] on September 27.  He currently lives at home alone and his brother and another friend check on him intermittent.  His brother reports that he is debilitated and is unable to move around much due to complaints of shortness of breath.   Addendum: Brother does note that last Thursday patient had 2 more pills of Eliquis and they had called into the [place] hospital to notify them of need of refill.  However, brother reports that he had not been called to pick up any prescription.  ED Course: Upon admission into the emergency department patient was seen to be afebrile with blood pressures elevated up to 186/102, and O2 saturations maintained on room air.  Labs from yesterday significant for hemoglobin  12.1, MCV 64, MCH 19.9, RDW 16.9, BNP 143, and high-sensitivity troponins negative x2.  Chest x-ray noted a right apical/peritracheal density concerning for atelectasis versus infiltrate.  CTA of the chest was obtained which noted small segmental pulmonary artery emboli and lower lobes bilaterally with right ventricle distention that was thought to possibly be chronic.  COVID-19 screening was positive.  Patient was started on heparin per pharmacy.  Review of Systems  Constitutional:  Negative for fever.  HENT:  Positive for congestion.   Eyes:  Negative for photophobia and pain.  Respiratory:  Positive for cough, sputum production and shortness of breath.   Cardiovascular:  Positive for chest pain and leg swelling.  Gastrointestinal:  Positive for nausea. Negative for abdominal pain and vomiting.  Genitourinary:  Negative for dysuria and hematuria.  Musculoskeletal:  Positive for joint pain.  Skin:  Negative for rash.  Neurological:  Negative for focal weakness and loss of consciousness.  Psychiatric/Behavioral:  Negative for substance abuse.   All other systems reviewed and are negative.  Past Medical History:  Diagnosis Date   Acute asthmatic bronchitis    Anemia    Anxiety disorder    Borderline diabetes mellitus    Borderline hypertension    Chronic back pain    Degenerative joint disease    Depression    GERD (gastroesophageal reflux disease)    History of adenomatous polyp of colon    History of sinusitis    Posttraumatic stress disorder    Sleep apnea    Uses CPAP  Past Surgical History:  Procedure Laterality Date   BIOPSY  10/31/2020   Procedure: BIOPSY;  Surgeon: Lavena Bullion, DO;  Location: [facility];  Service: Gastroenterology;;   COLONOSCOPY     ESOPHAGOGASTRODUODENOSCOPY (EGD) WITH PROPOFOL N/A 10/31/2020   Procedure: ESOPHAGOGASTRODUODENOSCOPY (EGD) WITH PROPOFO;  Surgeon: Lavena Bullion, DO;  Location: [facility];  Service: Gastroenterology;   Laterality: N/A;   FINGER SURGERY Right    index   HEMOSTASIS CLIP PLACEMENT  10/31/2020   Procedure: HEMOSTASIS CLIP PLACEMENT;  Surgeon: Lavena Bullion, DO;  Location: [facility];  Service: Gastroenterology;;   NASAL SINUS SURGERY     x 2   POLYPECTOMY     POLYPECTOMY  10/31/2020   Procedure: POLYPECTOMY;  Surgeon: Lavena Bullion, DO;  Location: [facility];  Service: Gastroenterology;;     reports that he quit smoking about 37 years ago. His smoking use included cigarettes. He has a 12.50 pack-year smoking history. He has never used smokeless tobacco. He reports that he does not drink alcohol and does not use drugs.  No Known Allergies  Family History  Problem Relation Age of Onset   Diabetes Father    Arthritis Mother        Unknown cause of death   Colon cancer Neg Hx    Colon polyps Neg Hx    Rectal cancer Neg Hx    Stomach cancer Neg Hx    Allergic rhinitis Neg Hx    Angioedema Neg Hx    Asthma Neg Hx    Eczema Neg Hx    Immunodeficiency Neg Hx    Urticaria Neg Hx     Prior to Admission medications   Medication Sig Start Date End Date Taking? Authorizing Provider  albuterol (VENTOLIN HFA) 108 (90 Base) MCG/ACT inhaler INHALE TWO PUFFS EVERY FOUR TO SIX HOURS AS NEEDED FOR COUGH OR WHEEZE. Patient taking differently: Inhale 2 puffs into the lungs every 4 (four) hours as needed for wheezing (cough). 04/03/20   Kozlow, Donnamarie Poag, MD  alum & mag hydroxide-simeth [facility] MAXIMUM STRENGTH) 400-400-40 MG/5ML suspension Take 5 mLs by mouth every 6 (six) hours as needed (abdominal pain or indigestion). 06/10/19   Carmin Muskrat, MD  apixaban (ELIQUIS) 5 MG TABS tablet Take 1 tablet (5 mg total) by mouth 2 (two) times daily. 11/03/20   Elgergawy, Silver Huguenin, MD  chlorhexidine (PERIDEX) 0.12 % solution 5 mLs by Mouth Rinse route 2 (two) times daily. 10/24/20   [provider]  docusate sodium (COLACE) 100 MG capsule Take 1 capsule (100 mg total) by mouth 2 (two) times  daily. 11/02/20   Elgergawy, Silver Huguenin, MD  ferrous sulfate 325 (65 FE) MG tablet Take 1 tablet (325 mg total) by mouth 3 (three) times daily with meals. 11/02/20   Elgergawy, Silver Huguenin, MD  fluticasone-salmeterol (ADVAIR) 500-50 MCG/ACT AEPB Inhale 1 puff into the lungs in the morning and at bedtime.    [provider]  furosemide (LASIX) 40 MG tablet Take 0.5 tablets (20 mg total) by mouth every other day. 11/02/20   Elgergawy, Silver Huguenin, MD  HYDROcodone-acetaminophen (NORCO) 5-325 MG tablet Take 1 tablet by mouth every 6 (six) hours as needed for moderate pain. 11/02/20   Elgergawy, Silver Huguenin, MD  metoprolol tartrate (LOPRESSOR) 25 MG tablet Take 1 tablet (25 mg total) by mouth 2 (two) times daily. 11/02/20   Elgergawy, Silver Huguenin, MD  montelukast (SINGULAIR) 10 MG tablet TAKE 1 TABLET BY MOUTH EVERYDAY AT BEDTIME Patient taking  differently: Take 10 mg by mouth at bedtime. 09/09/19   Kozlow, Donnamarie Poag, MD  pantoprazole (PROTONIX) 40 MG tablet Take 1 tablet (40 mg total) by mouth 2 (two) times daily. 11/02/20   Elgergawy, Silver Huguenin, MD  phenazopyridine (PYRIDIUM) 100 MG tablet Take 100 mg by mouth 3 (three) times daily with meals. For pain    [provider]  polyethylene glycol (MIRALAX / GLYCOLAX) 17 g packet Take 17 g by mouth daily as needed for moderate constipation. 11/02/20   Elgergawy, Silver Huguenin, MD  Propylene Glycol (SYSTANE BALANCE OP) Place 1 drop into both eyes daily as needed (for dry eyes).    [provider]  rosuvastatin (CRESTOR) 10 MG tablet Take 1 tablet (10 mg total) by mouth daily. 11/03/20   Elgergawy, Silver Huguenin, MD  tamsulosin (FLOMAX) 0.4 MG CAPS capsule Take 0.4 mg by mouth daily. 10/25/20   [provider]  triamcinolone (NASACORT ALLERGY 24HR) 55 MCG/ACT AERO nasal inhaler Use one spray in each nostril once daily as directed. Patient taking differently: Place 1 spray into the nose daily. 01/28/17   Kozlow, Donnamarie Poag, MD    Physical Exam:  Constitutional: NAD, calm,  comfortable Vitals:   05/22/21 0415 05/22/21 0430 05/22/21 0715 05/22/21 0730  BP: (!) 185/102 (!) 181/98 (!) 170/94 (!) 172/100  Pulse: 80 61 66 65  Resp: 18 12 12 13   Temp:      SpO2: 91% 98% 96% 94%   Eyes: PERRL, lids and conjunctivae normal ENMT: Mucous membranes are moist. Posterior pharynx clear of any exudate or lesions.Normal dentition.  Neck: normal, supple, no masses, no thyromegaly Respiratory: clear to auscultation bilaterally, no wheezing, no crackles. Normal respiratory effort. No accessory muscle use.  Cardiovascular: Regular rate and rhythm, no murmurs / rubs / gallops.  Trace lower extremity edema. 2+ pedal pulses. No carotid bruits.  Abdomen: no tenderness, no masses palpated. No hepatosplenomegaly. Bowel sounds positive.  Musculoskeletal: no clubbing / cyanosis. No joint deformity upper and lower extremities. Good ROM, no contractures. Normal muscle tone.  Skin: no rashes, lesions, ulcers. No induration Neurologic: CN 2-12 grossly intact. Sensation intact, DTR normal. Strength 5/5 in all 4.  Psychiatric: Normal judgment and insight. Alert and oriented x 3. Normal mood.     Labs on Admission: I have personally reviewed following labs and imaging studies  CBC: Recent Labs  Lab 05/21/21 2034  WBC 6.2  NEUTROABS 3.9  HGB 12.1*  HCT 39.0  MCV 64.0*  PLT 491   Basic Metabolic Panel: Recent Labs  Lab 05/21/21 2034  NA 138  K 3.9  CL 101  CO2 26  GLUCOSE 109*  BUN 7*  CREATININE 1.19  CALCIUM 9.5   GFR: CrCl cannot be calculated (Unknown ideal weight.). Liver Function Tests: Recent Labs  Lab 05/21/21 2034  AST 18  ALT 11  ALKPHOS 80  BILITOT 0.8  PROT 7.4  ALBUMIN 3.9   Recent Labs  Lab 05/21/21 2034  LIPASE 25   No results for input(s): AMMONIA in the last 168 hours. Coagulation Profile: Recent Labs  Lab 05/22/21 0320  INR 1.3*   Cardiac Enzymes: No results for input(s): CKTOTAL, CKMB, CKMBINDEX, TROPONINI in the last 168  hours. BNP (last 3 results) No results for input(s): PROBNP in the last 8760 hours. HbA1C: No results for input(s): HGBA1C in the last 72 hours. CBG: No results for input(s): GLUCAP in the last 168 hours. Lipid Profile: No results for input(s): CHOL, HDL, LDLCALC, TRIG, CHOLHDL, LDLDIRECT  in the last 72 hours. Thyroid Function Tests: No results for input(s): TSH, T4TOTAL, FREET4, T3FREE, THYROIDAB in the last 72 hours. Anemia Panel: No results for input(s): VITAMINB12, FOLATE, FERRITIN, TIBC, IRON, RETICCTPCT in the last 72 hours. Urine analysis:    Component Value Date/Time   COLORURINE YELLOW 05/21/2021 2017   APPEARANCEUR HAZY (A) 05/21/2021 2017   LABSPEC 1.011 05/21/2021 2017   PHURINE 7.0 05/21/2021 2017   GLUCOSEU NEGATIVE 05/21/2021 2017   HGBUR NEGATIVE 05/21/2021 2017   BILIRUBINUR NEGATIVE 05/21/2021 2017   KETONESUR NEGATIVE 05/21/2021 2017   PROTEINUR NEGATIVE 05/21/2021 2017   NITRITE NEGATIVE 05/21/2021 2017   LEUKOCYTESUR TRACE (A) 05/21/2021 2017   Sepsis Labs: Recent Results (from the past 240 hour(s))  Resp Panel by RT-PCR (Flu A&B, Covid) Urine, Clean Catch     Status: Abnormal   Collection Time: 05/21/21  8:18 PM   Specimen: Urine, Clean Catch; Nasopharyngeal(NP) swabs in vial transport medium  Result Value Ref Range Status   SARS Coronavirus 2 by RT PCR POSITIVE (A) NEGATIVE Final    Comment: RESULT CALLED TO, READ BACK BY AND VERIFIED WITH: RN BOBBY S. 05/21/21@22 :16 BY TW (NOTE) SARS-CoV-2 target nucleic acids are DETECTED.  The SARS-CoV-2 RNA is generally detectable in upper respiratory specimens during the acute phase of infection. Positive results are indicative of the presence of the identified virus, but do not rule out bacterial infection or co-infection with other pathogens not detected by the test. Clinical correlation with patient history and other diagnostic information is necessary to determine patient infection status. The expected  result is Negative.  Fact Sheet for Patients: EntrepreneurPulse.com.au  Fact Sheet for Healthcare Providers: IncredibleEmployment.be  This test is not yet approved or cleared by the [place] FDA and  has been authorized for detection and/or diagnosis of SARS-CoV-2 by FDA under an Emergency Use Authorization (EUA).  This EUA will remain in effect (meaning this test can be  used) for the duration of  the COVID-19 declaration under Section 564(b)(1) of the Act, 21 U.S.C. section 360bbb-3(b)(1), unless the authorization is terminated or revoked sooner.     Influenza A by PCR NEGATIVE NEGATIVE Final   Influenza B by PCR NEGATIVE NEGATIVE Final    Comment: (NOTE) The Xpert Xpress SARS-CoV-2/FLU/RSV plus assay is intended as an aid in the diagnosis of influenza from Nasopharyngeal swab specimens and should not be used as a sole basis for treatment. Nasal washings and aspirates are unacceptable for Xpert Xpress SARS-CoV-2/FLU/RSV testing.  Fact Sheet for Patients: EntrepreneurPulse.com.au  Fact Sheet for Healthcare Providers: IncredibleEmployment.be  This test is not yet approved or cleared by the [place] FDA and has been authorized for detection and/or diagnosis of SARS-CoV-2 by FDA under an Emergency Use Authorization (EUA). This EUA will remain in effect (meaning this test can be used) for the duration of the COVID-19 declaration under Section 564(b)(1) of the Act, 21 U.S.C. section 360bbb-3(b)(1), unless the authorization is terminated or revoked.  Performed at [facility], [place] [address], [place], [place] [address]      Radiological Exams on Admission: DG Chest 2 View  Result Date: 05/21/2021 CLINICAL DATA:  Chest pain. EXAM: CHEST - 2 VIEW COMPARISON:  Chest radiograph dated 10/26/2020. FINDINGS: Minimal bibasilar atelectasis. Right paratracheal density may represent atelectasis  related to shallow inspiration or mediastinal vasculature. Developing infiltrate is not excluded. Follow-up recommended. No pleural effusion, pneumothorax. Stable cardiomegaly. Degenerative changes of the spine. No acute osseous pathology. IMPRESSION: Right apical/paratracheal density may  represent atelectasis or infiltrate. Continued follow-up recommended. Electronically Signed   By: Anner Crete M.D.   On: 05/21/2021 21:52   CT Angio Chest Pulmonary Embolism (PE) W or WO Contrast  Addendum Date: 05/22/2021   ADDENDUM REPORT: 05/22/2021 02:13 ADDENDUM: Critical findings were reported to Dr. Waverly Ferrari at 2:11 a.m. Electronically Signed   By: Brett Fairy M.D.   On: 05/22/2021 02:13   Result Date: 05/22/2021 CLINICAL DATA:  Chest and back pain. PE suspected, high probability. EXAM: CT ANGIOGRAPHY CHEST WITH CONTRAST TECHNIQUE: Multidetector CT imaging of the chest was performed using the standard protocol during bolus administration of intravenous contrast. Multiplanar CT image reconstructions and MIPs were obtained to evaluate the vascular anatomy. CONTRAST:  58mL OMNIPAQUE IOHEXOL 350 MG/ML SOLN COMPARISON:  Nov 22, 2020. FINDINGS: Cardiovascular: The heart is normal in size and there is no pericardial effusion. A few scattered coronary artery calcifications are noted. There is mild dilatation of the ascending aorta measuring up to 4.0 cm in diameter. The pulmonary trunk is distended which may be associated with underlying pulmonary artery hypertension. A few tiny subsegmental pulmonary artery filling defects are noted in the lower lobes bilaterally. The right ventricle is mildly distended which is similar in appearance to the prior exam. Mediastinum/Nodes: No enlarged mediastinal, hilar, or axillary lymph nodes. Thyroid gland, trachea, and esophagus demonstrate no significant findings. Lungs/Pleura: Mild atelectasis is noted bilaterally. There is a 3 mm ground-glass nodule in the right middle lobe, axial  image 77. No effusion or pneumothorax. Upper Abdomen: There is a small hiatal hernia. A few scattered calcifications are present in the pancreas which may be related to chronic pancreatitis. There is thickening of the left adrenal gland without evidence of discrete nodule. The right adrenal gland is within normal limits. Musculoskeletal: Gynecomastia is noted bilaterally. Degenerative changes are present in the thoracic spine. Review of the MIP images confirms the above findings. IMPRESSION: 1. Small segmental pulmonary artery emboli defects in the lower lobes bilaterally. The right ventricle is distended which is similar in appearance to the prior exam and may be chronic, however right heart strain can not be excluded. 2. Distended pulmonary trunk which may be associated with underlying pulmonary artery hypertension. 3. Cardiomegaly with coronary artery calcifications. 4. Mild dilatation of the ascending aorta measuring up to 4.0 cm. Recommend annual imaging followup by CTA or MRA. This recommendation follows 2010 ACCF/AHA/AATS/ACR/ASA/SCA/SCAI/SIR/STS/SVM Guidelines for the Diagnosis and Management of Patients with Thoracic Aortic Disease. Circulation. 2010; 121: O130-Q657. Aortic aneurysm NOS (ICD10-I71.9). 5. Small hiatal hernia. 6. Right middle lobe pulmonary nodule, unchanged from the prior exam. Electronically Signed: By: Brett Fairy M.D. On: 05/22/2021 02:05    EKG: Independently reviewed.  Normal sinus rhythm at 61 bpm with first-degree heart block  Assessment/Plan Acute pulmonary embolism  history of DVT on chronic anticoagulation: He has been complaining of cough and shortness of breath, but had recently been diagnosed with COVID-19.  Currently O2 saturations maintained on room air.  Has history of bilateral leg DVT and has been on Elqiuis.  Patient reports taking as prescribed and denies missing doses frequently.  CT a noted small subsegmental pulmonary artery emboli deficits in the bilateral  lower lower lobes of the lung.  Question possibility of failure of Eliquis.  He had been started on heparin per pharmacy.  His brother does add that he had 2 pills left of his Eliquis last week and they had called into the [place] to get a refill.  Symptoms may in fact  be related to slowness as related to the [place] more so than noncompliance on the patient. -Admit to a medical telemetry bed -Continue heparin per pharmacy -Check echocardiogram -Oxycodone as needed for pain -Transitions of care consult -hematology/oncology consulted, we will follow-up for further recommendation  COVID-19 infection: Acute.  Reported to have initially been diagnosed on the 15th that the [facility] hospital.  No significant signs of pneumonia noted on imaging.  Remdesivir appears not to be clinically indicated at this time -Continue supportive care for -Albuterol inhaler as needed -Antitussives as needed  -Vitamin C and zinc  Asthma: Patient without acute wheezing noted on physical exam at this time. -pharmacy substitution for Advair  Microcytic hypochromic anemia: Hemoglobin 12.1 with low MCV and MCH.  RDW elevated at 16.9.  Previous iron studies from 10/29/2020 noted iron 35, TIBC 157, and ferritin 648 concerning for a inflammatory anemia / iron deficiency or thalassemia. -Daily monitoring of CBC -Check stool guaiac given prior history of GI bleed  Diastolic dysfunction: Last EF noted to be 50 to 55% with grade 1 diastolic dysfunction on 0/3474.  Patient appears to be euvolemic at this time. -strict I&Os and weights -Add on BNP  Essential hypertension -Continue metoprolol and furosemide  History of GI bleed secondary to gastric ulcer history of polyps: Patient underwent EGD by Gerrit Heck, DO on 5/3 which revealed oozing gastric ulcer treated with clips, nonobstructing Schatzki ring, gastritis, duodenitis, and single duodenal  polyp that was resected and clips placed. -Continue Protonix -Continue outpatient follow-up  with GI  Hyperlipidemia -Continue Crestor  Anxiety/Depression/ PTSD: Patient was just recently started on a mood medication last week, but his brother does not know the name of that medicine. -We will need to follow-up what new medication was started and resume if medically for  Incidental finding: CT noted ascending aorta measuring up to 4 cm, right middle lobe pulmonary nodule unchanged from prior, small hiatal hernia, concern for pulmonary artery hypertension, and cardiomegaly with coronary artery calcifications. -Recommend screening for AAA  OSA on CPAP  DVT prophylaxis: Heparin Code Status: DNR Family Communication: brother updated cell Disposition Plan: home  Consults called: hematology Admission status: observation  Norval Morton MD [facility]   If 7PM-7AM, please contact night-coverage   05/22/2021, 8:23 AM

## 2021-05-22 NOTE — Progress Notes (Signed)
ANTICOAGULATION CONSULT NOTE   Pharmacy Consult for Heparin Indication: pulmonary embolus  No Known Allergies  Patient Measurements:   Heparin Dosing Weight: 110 kg  Vital Signs: Temp: 98.4 F (36.9 C) (11/22 2000) Temp Source: Oral (11/22 2000) BP: 125/88 (11/22 2212) Pulse Rate: 64 (11/22 2212)  Labs: Recent Labs    05/21/21 2034 05/21/21 2302 05/22/21 0320 05/22/21 0916 05/22/21 1311 05/22/21 2155  HGB 12.1*  --   --  12.2*  --   --   HCT 39.0  --   --  39.4  --   --   PLT 207  --   --  201  --   --   APTT  --   --  28 137*  --   --   LABPROT  --   --  16.4*  --   --   --   INR  --   --  1.3*  --   --   --   HEPARINUNFRC  --   --  0.28*  --  0.94* 0.20*  CREATININE 1.19  --   --   --   --   --   TROPONINIHS 16 12  --   --   --   --      CrCl cannot be calculated (Unknown ideal weight.).   Medical History: Past Medical History:  Diagnosis Date   Acute asthmatic bronchitis    Anemia    Anxiety disorder    Borderline diabetes mellitus    Borderline hypertension    Chronic back pain    Degenerative joint disease    Depression    GERD (gastroesophageal reflux disease)    History of adenomatous polyp of colon    History of sinusitis    Posttraumatic stress disorder    Sleep apnea    Uses CPAP    Medications:  No current facility-administered medications on file prior to encounter.   Current Outpatient Medications on File Prior to Encounter  Medication Sig Dispense Refill   albuterol (VENTOLIN HFA) 108 (90 Base) MCG/ACT inhaler INHALE TWO PUFFS EVERY FOUR TO SIX HOURS AS NEEDED FOR COUGH OR WHEEZE. (Patient taking differently: Inhale 2 puffs into the lungs every 4 (four) hours as needed for wheezing (cough).) 8.5 each 0   alum & mag hydroxide-simeth (MYLANTA MAXIMUM STRENGTH) 400-400-40 MG/5ML suspension Take 5 mLs by mouth every 6 (six) hours as needed (abdominal pain or indigestion). 355 mL 0   apixaban (ELIQUIS) 5 MG TABS tablet Take 1 tablet (5 mg  total) by mouth 2 (two) times daily. 60 tablet 0   chlorhexidine (PERIDEX) 0.12 % solution 5 mLs by Mouth Rinse route 2 (two) times daily.     divalproex (DEPAKOTE) 125 MG DR tablet Take 125 mg by mouth 2 (two) times daily.     docusate sodium (COLACE) 100 MG capsule Take 1 capsule (100 mg total) by mouth 2 (two) times daily. 10 capsule 0   ferrous sulfate 325 (65 FE) MG tablet Take 1 tablet (325 mg total) by mouth 3 (three) times daily with meals.  3   fluticasone-salmeterol (ADVAIR) 500-50 MCG/ACT AEPB Inhale 1 puff into the lungs in the morning and at bedtime.     furosemide (LASIX) 20 MG tablet Take 20 mg by mouth 2 (two) times daily.     furosemide (LASIX) 40 MG tablet Take 0.5 tablets (20 mg total) by mouth every other day. 30 tablet 10   HYDROcodone-acetaminophen (NORCO) 5-325 MG tablet Take 1  tablet by mouth every 6 (six) hours as needed for moderate pain. 10 tablet 0   metoprolol tartrate (LOPRESSOR) 25 MG tablet Take 1 tablet (25 mg total) by mouth 2 (two) times daily.     montelukast (SINGULAIR) 10 MG tablet TAKE 1 TABLET BY MOUTH EVERYDAY AT BEDTIME (Patient taking differently: Take 10 mg by mouth at bedtime.) 30 tablet 0   omeprazole (PRILOSEC) 20 MG capsule Take 20 mg by mouth 2 (two) times daily.     pantoprazole (PROTONIX) 40 MG tablet Take 1 tablet (40 mg total) by mouth 2 (two) times daily.     phenazopyridine (PYRIDIUM) 100 MG tablet Take 100 mg by mouth 3 (three) times daily with meals. For pain     polyethylene glycol (MIRALAX / GLYCOLAX) 17 g packet Take 17 g by mouth daily as needed for moderate constipation. 14 each 0   Propylene Glycol (SYSTANE BALANCE OP) Place 1 drop into both eyes daily as needed (for dry eyes).     rosuvastatin (CRESTOR) 10 MG tablet Take 1 tablet (10 mg total) by mouth daily.     tamsulosin (FLOMAX) 0.4 MG CAPS capsule Take 0.4 mg by mouth daily.     triamcinolone (NASACORT ALLERGY 24HR) 55 MCG/ACT AERO nasal inhaler Use one spray in each nostril once  daily as directed. (Patient taking differently: Place 1 spray into the nose daily.) 1 Inhaler 5     Assessment: 75 y.o. male with H/O DVT on Eliquis admitted with new PE, for heparin.   11/22 PM update:  Heparin level sub-therapeutic on 1300 units/hr of heparin  Goal of Therapy:  Heparin level 0.3-0.7 units/ml Monitor platelets by anticoagulation protocol: Yes   Plan:  Inc heparin to 1450 units/hr Re-check heparin level in 8 hours  Narda Bonds, PharmD, [facility] Pharmacist Phone: [phone]

## 2021-05-22 NOTE — Progress Notes (Addendum)
ANTICOAGULATION CONSULT NOTE - Initial Consult  Pharmacy Consult for Heparin Indication: pulmonary embolus  No Known Allergies  Patient Measurements:   Heparin Dosing Weight: 110 kg  Vital Signs: Temp: 98.6 F (37 C) (11/21 2235) BP: 168/90 (11/22 0145) Pulse Rate: 68 (11/22 0145)  Labs: Recent Labs    05/21/21 2034 05/21/21 2302  HGB 12.1*  --   HCT 39.0  --   PLT 207  --   CREATININE 1.19  --   TROPONINIHS 16 12    CrCl cannot be calculated (Unknown ideal weight.).   Medical History: Past Medical History:  Diagnosis Date   Acute asthmatic bronchitis    Anemia    Anxiety disorder    Borderline diabetes mellitus    Borderline hypertension    Chronic back pain    Degenerative joint disease    Depression    GERD (gastroesophageal reflux disease)    History of adenomatous polyp of colon    History of sinusitis    Posttraumatic stress disorder    Sleep apnea    Uses CPAP    Medications:  No current facility-administered medications on file prior to encounter.   Current Outpatient Medications on File Prior to Encounter  Medication Sig Dispense Refill   albuterol (VENTOLIN HFA) 108 (90 Base) MCG/ACT inhaler INHALE TWO PUFFS EVERY FOUR TO SIX HOURS AS NEEDED FOR COUGH OR WHEEZE. (Patient taking differently: Inhale 2 puffs into the lungs every 4 (four) hours as needed for wheezing (cough).) 8.5 each 0   alum & mag hydroxide-simeth (MYLANTA MAXIMUM STRENGTH) 400-400-40 MG/5ML suspension Take 5 mLs by mouth every 6 (six) hours as needed (abdominal pain or indigestion). 355 mL 0   apixaban (ELIQUIS) 5 MG TABS tablet Take 1 tablet (5 mg total) by mouth 2 (two) times daily. 60 tablet 0   chlorhexidine (PERIDEX) 0.12 % solution 5 mLs by Mouth Rinse route 2 (two) times daily.     docusate sodium (COLACE) 100 MG capsule Take 1 capsule (100 mg total) by mouth 2 (two) times daily. 10 capsule 0   ferrous sulfate 325 (65 FE) MG tablet Take 1 tablet (325 mg total) by mouth 3  (three) times daily with meals.  3   fluticasone-salmeterol (ADVAIR) 500-50 MCG/ACT AEPB Inhale 1 puff into the lungs in the morning and at bedtime.     furosemide (LASIX) 40 MG tablet Take 0.5 tablets (20 mg total) by mouth every other day. 30 tablet 10   HYDROcodone-acetaminophen (NORCO) 5-325 MG tablet Take 1 tablet by mouth every 6 (six) hours as needed for moderate pain. 10 tablet 0   metoprolol tartrate (LOPRESSOR) 25 MG tablet Take 1 tablet (25 mg total) by mouth 2 (two) times daily.     montelukast (SINGULAIR) 10 MG tablet TAKE 1 TABLET BY MOUTH EVERYDAY AT BEDTIME (Patient taking differently: Take 10 mg by mouth at bedtime.) 30 tablet 0   pantoprazole (PROTONIX) 40 MG tablet Take 1 tablet (40 mg total) by mouth 2 (two) times daily.     phenazopyridine (PYRIDIUM) 100 MG tablet Take 100 mg by mouth 3 (three) times daily with meals. For pain     polyethylene glycol (MIRALAX / GLYCOLAX) 17 g packet Take 17 g by mouth daily as needed for moderate constipation. 14 each 0   Propylene Glycol (SYSTANE BALANCE OP) Place 1 drop into both eyes daily as needed (for dry eyes).     rosuvastatin (CRESTOR) 10 MG tablet Take 1 tablet (10 mg total) by mouth daily.  tamsulosin (FLOMAX) 0.4 MG CAPS capsule Take 0.4 mg by mouth daily.     triamcinolone (NASACORT ALLERGY 24HR) 55 MCG/ACT AERO nasal inhaler Use one spray in each nostril once daily as directed. (Patient taking differently: Place 1 spray into the nose daily.) 1 Inhaler 5     Assessment: 75 y.o. male with H/O DVT on Eliquis admitted with new PE, for heparin.  Last dose of Eliquis AM 11/21 per patient report  Goal of Therapy:  aPTT 66-102 sec Heparin level 0.3-0.7 units/ml Monitor platelets by anticoagulation protocol: Yes   Plan:  Check baseline aPTT/INR/Heparin level Start heparin 1500 units/hr  aPTT in 8 hours  Roxy Filler, [place] [facility] 05/22/2021,3:25 AM  Addendum: Baseline anti-Xa level 0.28  Pt likely noncompliant with  Eliquis Will add heparin 4000 units IV bolus  Phillis Knack, PharmD, BCPS 05/22/2021 5:31 AM

## 2021-05-22 NOTE — Consult Note (Addendum)
[place]  [phone] [phone]   MEDICAL ONCOLOGY - INITIAL CONSULTATION  Referral MD: Dr. 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  He presented to the emergency department with chest pain.  He was having sharp chest pain with radiation to his left shoulder and back.  He was also having nausea, shortness of breath, and productive cough.  Of note, the patient tested positive for COVID-19 on 05/15/2021 at the [facility] hospital.  In the emergency department, his hemoglobin was 12.1, MCV 64, MCH 19.9, RDW 16.9, BNP 143.  Chest x-ray noted a right apical/paratracheal density concerning for atelectasis versus infiltrate.  CTA chest showed small segmental pulmonary artery emboli defects in the lower lobes bilaterally, the right ventricle is distended which is similar in appearance to prior exam and may be chronic but right heart strain cannot be excluded, distended pulmonary trunk which may be associated with underlying pulmonary artery hypertension, cardiomegaly, right middle lobe pulmonary nodule which is unchanged from prior exam.  The patient has been started on heparin per pharmacy.  He again tested positive for COVID-19.  Initially, the patient's brother reported that the patient was Eliquis without any missed doses.  However, an addendum was added that indicates that the brother notes that last Thursday the patient had 2 more pills of Eliquis and that they called into the [place] to notify them of need for refill.  However, the brother reports that he has not been called to pick up any prescription.  I met with the patient in the emergency department.  No family members were at the bedside.  He was resting quietly on the stretcher.  He was slow to answer questions at  times.  The patient tells me that he has been on Eliquis.  He was aware that he had a blood clot in his leg but told me that it was only 1 leg and not bilateral DVTs.  He also told me that he has been on Eliquis for only about 7 to 10 days and that he may have missed 1 day of the Eliquis.  However, review of the records indicate that he had an ultrasound performed on 10/30/2020 which showed bilateral DVTs and he even had a DVT dating back to June 2020. It appears that he was initially started on Xarelto when he was first diagnosed with DVT. There are some reports in his chart that he was not taking the Xarelto appropriately at home.  He appears to have been switched to Eliquis during a hospitalization in December 2021 (unclear why the patient was switched from Xarelto to Eliquis).  The patient denies any bleeding.  He has not having any headaches or dizziness.  He reports ongoing chest discomfort.  He also reports shortness of breath but no cough.  He is not having any fevers.  Denies abdominal pain, nausea, vomiting.  He reports some lower extremity edema.  The patient lives by himself.  Denies history of alcohol use.  Has a remote history of tobacco use.  There is a mention in his chart of his mother having a thalassemia trait.  He denies family history of DVT and PE.  Hematology was asked to see the patient to make recommendations regarding his acute pulmonary embolism.  Past Medical History:  Diagnosis Date   Acute asthmatic  bronchitis    Anemia    Anxiety disorder    Borderline diabetes mellitus    Borderline hypertension    Chronic back pain    Degenerative joint disease    Depression    GERD (gastroesophageal reflux disease)    History of adenomatous polyp of colon    History of sinusitis    Posttraumatic stress disorder    Sleep apnea    Uses CPAP  :   Past Surgical History:  Procedure Laterality Date   BIOPSY  10/31/2020   Procedure: BIOPSY;  Surgeon: Lavena Bullion, DO;  Location:  [place] [facility];  Service: Gastroenterology;;   COLONOSCOPY     ESOPHAGOGASTRODUODENOSCOPY (EGD) WITH PROPOFOL N/A 10/31/2020   Procedure: ESOPHAGOGASTRODUODENOSCOPY (EGD) WITH PROPOFO;  Surgeon: Lavena Bullion, DO;  Location: [place];  Service: Gastroenterology;  Laterality: N/A;   FINGER SURGERY Right    index   HEMOSTASIS CLIP PLACEMENT  10/31/2020   Procedure: HEMOSTASIS CLIP PLACEMENT;  Surgeon: Lavena Bullion, DO;  Location: [place] [facility];  Service: Gastroenterology;;   NASAL SINUS SURGERY     x 2   POLYPECTOMY     POLYPECTOMY  10/31/2020   Procedure: POLYPECTOMY;  Surgeon: Lavena Bullion, DO;  Location: [place] [facility];  Service: Gastroenterology;;  :   Current Facility-Administered Medications  Medication Dose Route Frequency Provider Last Rate Last Admin   acetaminophen (TYLENOL) tablet 650 mg  650 mg Oral Q6H PRN Norval Morton, MD       Or   acetaminophen (TYLENOL) suppository 650 mg  650 mg Rectal Q6H PRN Fuller Plan A, MD       albuterol (PROVENTIL) (2.5 MG/3ML) 0.083% nebulizer solution 2.5 mg  2.5 mg Inhalation Q6H PRN Norval Morton, MD       ascorbic acid (VITAMIN C) tablet 500 mg  500 mg Oral Daily Tamala Julian, Rondell A, MD   500 mg at 05/22/21 1116   aspirin tablet 325 mg  325 mg Oral Daily  Raring, PA-C   325 mg at 05/22/21 1116   chlorpheniramine-HYDROcodone (TUSSIONEX) 10-8 MG/5ML suspension 5 mL  5 mL Oral Q12H PRN Fuller Plan A, MD       furosemide (LASIX) tablet 20 mg  20 mg Oral QODAY Smith, Rondell A, MD       guaiFENesin-dextromethorphan (ROBITUSSIN DM) 100-10 MG/5ML syrup 10 mL  10 mL Oral Q4H PRN Smith, Rondell A, MD       heparin ADULT infusion 100 units/mL (25000 units/245m)  1,500 Units/hr Intravenous Continuous RShela Leff MD 15 mL/hr at 05/22/21 0409 1,500 Units/hr at 05/22/21 0409   metoprolol tartrate (LOPRESSOR) tablet 25 mg  25 mg Oral BID Smith, Rondell A, MD       mometasone-formoterol (DULERA) 200-5 MCG/ACT inhaler 2 puff  2  puff Inhalation BID STamala Julian Rondell A, MD       ondansetron (ZOFRAN) tablet 4 mg  4 mg Oral Q6H PRN SFuller PlanA, MD       Or   ondansetron (ZOFRAN) injection 4 mg  4 mg Intravenous Q6H PRN Smith, Rondell A, MD       oxyCODONE (Oxy IR/ROXICODONE) immediate release tablet 5 mg  5 mg Oral Q6H PRN SFuller PlanA, MD   5 mg at 05/22/21 1116   pantoprazole (PROTONIX) EC tablet 40 mg  40 mg Oral BID Smith, Rondell A, MD       polyethylene glycol (MIRALAX / GLYCOLAX) packet 17 g  17 g Oral Daily PRN SFuller Plan  A, MD       rosuvastatin (CRESTOR) tablet 10 mg  10 mg Oral Daily Smith, Rondell A, MD       sodium chloride flush (NS) 0.9 % injection 3 mL  3 mL Intravenous Q12H Smith, Rondell A, MD   3 mL at 05/22/21 1116   zinc sulfate capsule 220 mg  220 mg Oral Daily Fuller Plan A, MD   220 mg at 05/22/21 1116   Current Outpatient Medications  Medication Sig Dispense Refill   albuterol (VENTOLIN HFA) 108 (90 Base) MCG/ACT inhaler INHALE TWO PUFFS EVERY FOUR TO SIX HOURS AS NEEDED FOR COUGH OR WHEEZE. (Patient taking differently: Inhale 2 puffs into the lungs every 4 (four) hours as needed for wheezing (cough).) 8.5 each 0   alum & mag hydroxide-simeth (MYLANTA MAXIMUM STRENGTH) 400-400-40 MG/5ML suspension Take 5 mLs by mouth every 6 (six) hours as needed (abdominal pain or indigestion). 355 mL 0   apixaban (ELIQUIS) 5 MG TABS tablet Take 1 tablet (5 mg total) by mouth 2 (two) times daily. 60 tablet 0   chlorhexidine (PERIDEX) 0.12 % solution 5 mLs by Mouth Rinse route 2 (two) times daily.     divalproex (DEPAKOTE) 125 MG DR tablet Take 125 mg by mouth 2 (two) times daily.     docusate sodium (COLACE) 100 MG capsule Take 1 capsule (100 mg total) by mouth 2 (two) times daily. 10 capsule 0   ferrous sulfate 325 (65 FE) MG tablet Take 1 tablet (325 mg total) by mouth 3 (three) times daily with meals.  3   fluticasone-salmeterol (ADVAIR) 500-50 MCG/ACT AEPB Inhale 1 puff into the lungs in the  morning and at bedtime.     furosemide (LASIX) 20 MG tablet Take 20 mg by mouth 2 (two) times daily.     furosemide (LASIX) 40 MG tablet Take 0.5 tablets (20 mg total) by mouth every other day. 30 tablet 10   HYDROcodone-acetaminophen (NORCO) 5-325 MG tablet Take 1 tablet by mouth every 6 (six) hours as needed for moderate pain. 10 tablet 0   metoprolol tartrate (LOPRESSOR) 25 MG tablet Take 1 tablet (25 mg total) by mouth 2 (two) times daily.     montelukast (SINGULAIR) 10 MG tablet TAKE 1 TABLET BY MOUTH EVERYDAY AT BEDTIME (Patient taking differently: Take 10 mg by mouth at bedtime.) 30 tablet 0   omeprazole (PRILOSEC) 20 MG capsule Take 20 mg by mouth 2 (two) times daily.     pantoprazole (PROTONIX) 40 MG tablet Take 1 tablet (40 mg total) by mouth 2 (two) times daily.     phenazopyridine (PYRIDIUM) 100 MG tablet Take 100 mg by mouth 3 (three) times daily with meals. For pain     polyethylene glycol (MIRALAX / GLYCOLAX) 17 g packet Take 17 g by mouth daily as needed for moderate constipation. 14 each 0   Propylene Glycol (SYSTANE BALANCE OP) Place 1 drop into both eyes daily as needed (for dry eyes).     rosuvastatin (CRESTOR) 10 MG tablet Take 1 tablet (10 mg total) by mouth daily.     tamsulosin (FLOMAX) 0.4 MG CAPS capsule Take 0.4 mg by mouth daily.     triamcinolone (NASACORT ALLERGY 24HR) 55 MCG/ACT AERO nasal inhaler Use one spray in each nostril once daily as directed. (Patient taking differently: Place 1 spray into the nose daily.) 1 Inhaler 5     No Known Allergies:   Family History  Problem Relation Age of Onset   Diabetes Father  Arthritis Mother        Unknown cause of death   Colon cancer Neg Hx    Colon polyps Neg Hx    Rectal cancer Neg Hx    Stomach cancer Neg Hx    Allergic rhinitis Neg Hx    Angioedema Neg Hx    Asthma Neg Hx    Eczema Neg Hx    Immunodeficiency Neg Hx    Urticaria Neg Hx   :   Social History   Socioeconomic History   Marital status:  Single    Spouse name: Not on file   Number of children: 1   Years of education: Not on file   Highest education level: Not on file  Occupational History   Occupation: former Building control surveyor for [employer]    Employer: [employer]  Tobacco Use   Smoking status: Former    Packs/day: 0.50    Years: 25.00    Pack years: 12.50    Types: Cigarettes    Quit date: 07/02/1983    Years since quitting: 37.9   Smokeless tobacco: Never  Vaping Use   Vaping Use: Never used  Substance and Sexual Activity   Alcohol use: No   Drug use: No   Sexual activity: Not on file  Other Topics Concern   Not on file  Social History Narrative   Lives alone.  One child.    Social Determinants of Health   Financial Resource Strain: Not on file  Food Insecurity: Not on file  Transportation Needs: Not on file  Physical Activity: Not on file  Stress: Not on file  Social Connections: Not on file  Intimate Partner Violence: Not on file  :  Review of Systems: A comprehensive 14 point review of systems was negative except as noted in the HPI.  Exam: Patient Vitals for the past 24 hrs:  BP Temp Pulse Resp SpO2  05/22/21 1230 (!) 171/102 -- 72 14 96 %  05/22/21 1215 (!) 160/101 -- 71 16 96 %  05/22/21 1200 (!) 167/105 -- 73 15 95 %  05/22/21 1145 (!) 167/109 -- 69 15 97 %  05/22/21 1130 (!) 178/99 -- 70 14 99 %  05/22/21 1045 (!) 178/103 -- 71 13 100 %  05/22/21 0745 (!) 187/99 -- 66 15 95 %  05/22/21 0730 (!) 172/100 -- 65 13 94 %  05/22/21 0715 (!) 170/94 -- 66 12 96 %  05/22/21 0430 (!) 181/98 -- 61 12 98 %  05/22/21 0415 (!) 185/102 -- 80 18 91 %  05/22/21 0400 (!) 186/102 -- 62 12 97 %  05/22/21 0145 (!) 168/90 -- 68 16 96 %  05/22/21 0110 (!) 179/96 -- 70 20 98 %  05/22/21 0045 -- -- 68 -- 97 %  05/22/21 0000 -- -- 63 12 98 %  05/21/21 2330 -- -- 65 13 97 %  05/21/21 2310 (!) 166/95 -- 73 16 98 %  05/21/21 2235 (!) 179/121 98.6 F (37 C) 77 14 98 %  05/21/21 2011 (!) 184/102 98 F (36.7 C) 79 16 95 %     General: Awake and alert, resting quietly on stretcher. Eyes:  no scleral icterus.   ENT:  There were no oropharyngeal lesions.   Lymphatics:  Negative cervical, supraclavicular or axillary adenopathy.   Respiratory: lungs were clear bilaterally without wheezing or crackles.   Cardiovascular:  Regular rate and rhythm, S1/S2, without murmur, rub or gallop.  Trace lower extremity edema. GI: Positive bowel sounds, soft, nontender. Skin:  No rashes or petechiae.  Dry flaky skin on the bilateral lower extremities. Neuro: Alert, oriented.  Slow to respond to questions.  Lab Results  Component Value Date   WBC 6.5 05/22/2021   HGB 12.2 (L) 05/22/2021   HCT 39.4 05/22/2021   PLT 201 05/22/2021   GLUCOSE 109 (H) 05/21/2021   CHOL 144 03/01/2019   TRIG 65.0 03/01/2019   HDL 51.50 03/01/2019   LDLCALC 80 03/01/2019   ALT 11 05/21/2021   AST 18 05/21/2021   NA 138 05/21/2021   K 3.9 05/21/2021   CL 101 05/21/2021   CREATININE 1.19 05/21/2021   BUN 7 (L) 05/21/2021   CO2 26 05/21/2021    DG Chest 2 View  Result Date: 05/21/2021 CLINICAL DATA:  Chest pain. EXAM: CHEST - 2 VIEW COMPARISON:  Chest radiograph dated 10/26/2020. FINDINGS: Minimal bibasilar atelectasis. Right paratracheal density may represent atelectasis related to shallow inspiration or mediastinal vasculature. Developing infiltrate is not excluded. Follow-up recommended. No pleural effusion, pneumothorax. Stable cardiomegaly. Degenerative changes of the spine. No acute osseous pathology. IMPRESSION: Right apical/paratracheal density may represent atelectasis or infiltrate. Continued follow-up recommended. Electronically Signed   By: Anner Crete M.D.   On: 05/21/2021 21:52   CT Angio Chest Pulmonary Embolism (PE) W or WO Contrast  Addendum Date: 05/22/2021   ADDENDUM REPORT: 05/22/2021 02:13 ADDENDUM: Critical findings were reported to Dr. Waverly Ferrari at 2:11 a.m. Electronically Signed   By: Brett Fairy M.D.   On:  05/22/2021 02:13   Result Date: 05/22/2021 CLINICAL DATA:  Chest and back pain. PE suspected, high probability. EXAM: CT ANGIOGRAPHY CHEST WITH CONTRAST TECHNIQUE: Multidetector CT imaging of the chest was performed using the standard protocol during bolus administration of intravenous contrast. Multiplanar CT image reconstructions and MIPs were obtained to evaluate the vascular anatomy. CONTRAST:  19m OMNIPAQUE IOHEXOL 350 MG/ML SOLN COMPARISON:  Nov 22, 2020. FINDINGS: Cardiovascular: The heart is normal in size and there is no pericardial effusion. A few scattered coronary artery calcifications are noted. There is mild dilatation of the ascending aorta measuring up to 4.0 cm in diameter. The pulmonary trunk is distended which may be associated with underlying pulmonary artery hypertension. A few tiny subsegmental pulmonary artery filling defects are noted in the lower lobes bilaterally. The right ventricle is mildly distended which is similar in appearance to the prior exam. Mediastinum/Nodes: No enlarged mediastinal, hilar, or axillary lymph nodes. Thyroid gland, trachea, and esophagus demonstrate no significant findings. Lungs/Pleura: Mild atelectasis is noted bilaterally. There is a 3 mm ground-glass nodule in the right middle lobe, axial image 77. No effusion or pneumothorax. Upper Abdomen: There is a small hiatal hernia. A few scattered calcifications are present in the pancreas which may be related to chronic pancreatitis. There is thickening of the left adrenal gland without evidence of discrete nodule. The right adrenal gland is within normal limits. Musculoskeletal: Gynecomastia is noted bilaterally. Degenerative changes are present in the thoracic spine. Review of the MIP images confirms the above findings. IMPRESSION: 1. Small segmental pulmonary artery emboli defects in the lower lobes bilaterally. The right ventricle is distended which is similar in appearance to the prior exam and may be  chronic, however right heart strain can not be excluded. 2. Distended pulmonary trunk which may be associated with underlying pulmonary artery hypertension. 3. Cardiomegaly with coronary artery calcifications. 4. Mild dilatation of the ascending aorta measuring up to 4.0 cm. Recommend annual imaging followup by CTA or MRA. This recommendation follows 2010 ACCF/AHA/AATS/ACR/ASA/SCA/SCAI/SIR/STS/SVM Guidelines  for the Diagnosis and Management of Patients with Thoracic Aortic Disease. Circulation. 2010; 121: C376-E831. Aortic aneurysm NOS (ICD10-I71.9). 5. Small hiatal hernia. 6. Right middle lobe pulmonary nodule, unchanged from the prior exam. Electronically Signed: By: Brett Fairy M.D. On: 05/22/2021 02:05     DG Chest 2 View  Result Date: 05/21/2021 CLINICAL DATA:  Chest pain. EXAM: CHEST - 2 VIEW COMPARISON:  Chest radiograph dated 10/26/2020. FINDINGS: Minimal bibasilar atelectasis. Right paratracheal density may represent atelectasis related to shallow inspiration or mediastinal vasculature. Developing infiltrate is not excluded. Follow-up recommended. No pleural effusion, pneumothorax. Stable cardiomegaly. Degenerative changes of the spine. No acute osseous pathology. IMPRESSION: Right apical/paratracheal density may represent atelectasis or infiltrate. Continued follow-up recommended. Electronically Signed   By: Anner Crete M.D.   On: 05/21/2021 21:52   CT Angio Chest Pulmonary Embolism (PE) W or WO Contrast  Addendum Date: 05/22/2021   ADDENDUM REPORT: 05/22/2021 02:13 ADDENDUM: Critical findings were reported to Dr. Waverly Ferrari at 2:11 a.m. Electronically Signed   By: Brett Fairy M.D.   On: 05/22/2021 02:13   Result Date: 05/22/2021 CLINICAL DATA:  Chest and back pain. PE suspected, high probability. EXAM: CT ANGIOGRAPHY CHEST WITH CONTRAST TECHNIQUE: Multidetector CT imaging of the chest was performed using the standard protocol during bolus administration of intravenous contrast.  Multiplanar CT image reconstructions and MIPs were obtained to evaluate the vascular anatomy. CONTRAST:  13mL OMNIPAQUE IOHEXOL 350 MG/ML SOLN COMPARISON:  Nov 22, 2020. FINDINGS: Cardiovascular: The heart is normal in size and there is no pericardial effusion. A few scattered coronary artery calcifications are noted. There is mild dilatation of the ascending aorta measuring up to 4.0 cm in diameter. The pulmonary trunk is distended which may be associated with underlying pulmonary artery hypertension. A few tiny subsegmental pulmonary artery filling defects are noted in the lower lobes bilaterally. The right ventricle is mildly distended which is similar in appearance to the prior exam. Mediastinum/Nodes: No enlarged mediastinal, hilar, or axillary lymph nodes. Thyroid gland, trachea, and esophagus demonstrate no significant findings. Lungs/Pleura: Mild atelectasis is noted bilaterally. There is a 3 mm ground-glass nodule in the right middle lobe, axial image 77. No effusion or pneumothorax. Upper Abdomen: There is a small hiatal hernia. A few scattered calcifications are present in the pancreas which may be related to chronic pancreatitis. There is thickening of the left adrenal gland without evidence of discrete nodule. The right adrenal gland is within normal limits. Musculoskeletal: Gynecomastia is noted bilaterally. Degenerative changes are present in the thoracic spine. Review of the MIP images confirms the above findings. IMPRESSION: 1. Small segmental pulmonary artery emboli defects in the lower lobes bilaterally. The right ventricle is distended which is similar in appearance to the prior exam and may be chronic, however right heart strain can not be excluded. 2. Distended pulmonary trunk which may be associated with underlying pulmonary artery hypertension. 3. Cardiomegaly with coronary artery calcifications. 4. Mild dilatation of the ascending aorta measuring up to 4.0 cm. Recommend annual imaging  followup by CTA or MRA. This recommendation follows 2010 ACCF/AHA/AATS/ACR/ASA/SCA/SCAI/SIR/STS/SVM Guidelines for the Diagnosis and Management of Patients with Thoracic Aortic Disease. Circulation. 2010; 121: D176-H607. Aortic aneurysm NOS (ICD10-I71.9). 5. Small hiatal hernia. 6. Right middle lobe pulmonary nodule, unchanged from the prior exam. Electronically Signed: By: Brett Fairy M.D. On: 05/22/2021 02:05    Peripheral blood smear review -WBC morphology unremarkable, majority are mature neutrophils, no blasts or other young forms.  Platelets normal in number.  Increased polychromasia,  ovalocytes, teardrops, target cells, acanthocytes, burr cells, and cigar cells present.  Assessment and Plan:  1.  Acute pulmonary embolism -CTA chest 05/22/2021-small segmental pulmonary artery emboli defects in the lower lobes bilaterally 2.  History of bilateral DVT initially diagnosed June 2020 3.  Microcytic anemia -Ferritin 137 on 05/22/2021 4.  COVID-19 infection 5.  Diastolic heart failure 6.  Hypertension 7.  History of GI bleed secondary to gastric ulcer and history of polyps -Status post EGD 10/31/2020 which showed an oozing gastric ulcer, gastritis, duodenitis 8.  Hyperlipidemia 9.  Anxiety/depression/PTSD 10.  OSA on CPAP 11.  Cardiomegaly 12.  Small hiatal hernia 13.  Right middle lobe pulmonary nodule  Shane [name] was admitted with chest pain and found to have acute PE.  He has a history of bilateral DVT and was initially given Xarelto and subsequently switched to Eliquis in December 2021 (unclear from chart why he was switched).  There is some discrepancy as to whether the patient has been taking his Eliquis as directed.  The patient indicates that he may have missed 1 day of Eliquis.  However, his brother seems to indicate that he may have missed more than this.  Additionally, he also has an active COVID-19 infection which can increase the risk of hypercoagulability.  Agree with heparin  drip for now.  Unclear if the patient has failed Eliquis and may be reasonable to consider retrying him on this medication.  However, I am concerned about his ability to take this medication as directed.  Recommend rechecking bilateral Doppler ultrasound.  IVC filter can be considered if unable to take anticoagulation.  The patient has microcytic anemia and also has a history of GI bleed.  He underwent upper endoscopy earlier this year which showed an oozing gastric ulcer, gastritis, and duodenitis.  His ferritin level is normal, but this could be an acute phase reactant in the setting of active COVID-19 infection.  Stool for occult blood has been obtained and is negative.  There is a mention in his chart that the patient may have a family history of thalassemia.  We will review peripheral blood smear.  Recommendations: 1.  Continue heparin drip for 48-72 hours. 2.  Can restart Eliquis or begin Xarelto as it is not clear that he failed Eliquis since there were missed doses. 3.  If unable to take anticoagulation, IVC filter can be considered. 4.  Obtain bilateral Doppler ultrasound to evaluate for DVT. 5.  Review peripheral blood smear.  Will add on a soluble transferrin receptor and hemoglobin electrophoresis to a.m. labs. 6.  Management of COVID-19 and other chronic medical conditions per hospitalist.  Thank you for this referral.   Shane Bussing, DNP, AGPCNP-BC, AOCNP   Shane [name] was interviewed and examined.  I reviewed the peripheral blood smear He is admitted with chest pain and found to have bilateral pulmonary embolism.  He has a remote history of bilateral lower extremity DVTs.  Shane [name] is a poor historian.  It is unclear whether he has been taking anticoagulation consistently.  The baseline coagulation parameters on admission argue against him taking recent anticoagulation therapy I agree with continuing heparin anticoagulation for now.  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  He is also at risk for venous thrombosis with the recent COVID-19 infection.  He has Red cell microcytosis.  The peripheral blood smear findings are consistent with a diagnosis of iron deficiency.  He may have iron  deficiency in addition to a thalassemia variant.  We will perform additional diagnostic evaluation.  He will need to undergo further evaluation for source of blood loss if he is confirmed to have iron deficiency.  Hematology will check on him over the next few days.  He can be followed as an outpatient at the [facility] clinic.  I was present for greater than 50% of today's visit.  I performed medical decision making.

## 2021-05-22 NOTE — ED Notes (Signed)
Pt was never under my care, no report gotten on this pt. Pt transferred to room 43 after my shift is over.

## 2021-05-22 NOTE — ED Notes (Signed)
Meal given at bedside

## 2021-05-23 ENCOUNTER — Observation Stay (HOSPITAL_COMMUNITY): Payer: PPO

## 2021-05-23 DIAGNOSIS — J45909 Unspecified asthma, uncomplicated: Secondary | ICD-10-CM | POA: Diagnosis present

## 2021-05-23 DIAGNOSIS — I2699 Other pulmonary embolism without acute cor pulmonale: Secondary | ICD-10-CM

## 2021-05-23 DIAGNOSIS — N400 Enlarged prostate without lower urinary tract symptoms: Secondary | ICD-10-CM | POA: Diagnosis present

## 2021-05-23 DIAGNOSIS — E669 Obesity, unspecified: Secondary | ICD-10-CM | POA: Diagnosis present

## 2021-05-23 DIAGNOSIS — Z79899 Other long term (current) drug therapy: Secondary | ICD-10-CM | POA: Diagnosis not present

## 2021-05-23 DIAGNOSIS — Z66 Do not resuscitate: Secondary | ICD-10-CM | POA: Diagnosis present

## 2021-05-23 DIAGNOSIS — I2609 Other pulmonary embolism with acute cor pulmonale: Secondary | ICD-10-CM

## 2021-05-23 DIAGNOSIS — I10 Essential (primary) hypertension: Secondary | ICD-10-CM | POA: Diagnosis not present

## 2021-05-23 DIAGNOSIS — K219 Gastro-esophageal reflux disease without esophagitis: Secondary | ICD-10-CM | POA: Diagnosis not present

## 2021-05-23 DIAGNOSIS — Z86718 Personal history of other venous thrombosis and embolism: Secondary | ICD-10-CM | POA: Diagnosis not present

## 2021-05-23 DIAGNOSIS — G4730 Sleep apnea, unspecified: Secondary | ICD-10-CM | POA: Diagnosis not present

## 2021-05-23 DIAGNOSIS — N179 Acute kidney failure, unspecified: Secondary | ICD-10-CM | POA: Diagnosis not present

## 2021-05-23 DIAGNOSIS — N1411 Contrast-induced nephropathy: Secondary | ICD-10-CM | POA: Diagnosis not present

## 2021-05-23 DIAGNOSIS — Z8616 Personal history of COVID-19: Secondary | ICD-10-CM | POA: Diagnosis not present

## 2021-05-23 DIAGNOSIS — E785 Hyperlipidemia, unspecified: Secondary | ICD-10-CM | POA: Diagnosis present

## 2021-05-23 DIAGNOSIS — F4310 Post-traumatic stress disorder, unspecified: Secondary | ICD-10-CM | POA: Diagnosis present

## 2021-05-23 DIAGNOSIS — D563 Thalassemia minor: Secondary | ICD-10-CM | POA: Diagnosis present

## 2021-05-23 DIAGNOSIS — G4733 Obstructive sleep apnea (adult) (pediatric): Secondary | ICD-10-CM | POA: Diagnosis present

## 2021-05-23 DIAGNOSIS — Z6834 Body mass index (BMI) 34.0-34.9, adult: Secondary | ICD-10-CM | POA: Diagnosis not present

## 2021-05-23 DIAGNOSIS — Z8719 Personal history of other diseases of the digestive system: Secondary | ICD-10-CM | POA: Diagnosis not present

## 2021-05-23 DIAGNOSIS — Z7901 Long term (current) use of anticoagulants: Secondary | ICD-10-CM | POA: Diagnosis not present

## 2021-05-23 DIAGNOSIS — I5032 Chronic diastolic (congestive) heart failure: Secondary | ICD-10-CM | POA: Diagnosis present

## 2021-05-23 DIAGNOSIS — R296 Repeated falls: Secondary | ICD-10-CM | POA: Diagnosis present

## 2021-05-23 DIAGNOSIS — U071 COVID-19: Secondary | ICD-10-CM | POA: Diagnosis not present

## 2021-05-23 DIAGNOSIS — I110 Hypertensive heart disease with heart failure: Secondary | ICD-10-CM | POA: Diagnosis present

## 2021-05-23 DIAGNOSIS — Z7951 Long term (current) use of inhaled steroids: Secondary | ICD-10-CM | POA: Diagnosis not present

## 2021-05-23 DIAGNOSIS — D509 Iron deficiency anemia, unspecified: Secondary | ICD-10-CM | POA: Diagnosis not present

## 2021-05-23 DIAGNOSIS — Z7982 Long term (current) use of aspirin: Secondary | ICD-10-CM | POA: Diagnosis not present

## 2021-05-23 DIAGNOSIS — Z87891 Personal history of nicotine dependence: Secondary | ICD-10-CM | POA: Diagnosis not present

## 2021-05-23 LAB — CBC
HCT: 36 % — ABNORMAL LOW (ref 39.0–52.0)
Hemoglobin: 11.5 g/dL — ABNORMAL LOW (ref 13.0–17.0)
MCH: 20.2 pg — ABNORMAL LOW (ref 26.0–34.0)
MCHC: 31.9 g/dL (ref 30.0–36.0)
MCV: 63.3 fL — ABNORMAL LOW (ref 80.0–100.0)
Platelets: 207 10*3/uL (ref 150–400)
RBC: 5.69 MIL/uL (ref 4.22–5.81)
RDW: 16 % — ABNORMAL HIGH (ref 11.5–15.5)
WBC: 6.4 10*3/uL (ref 4.0–10.5)
nRBC: 0.3 % — ABNORMAL HIGH (ref 0.0–0.2)

## 2021-05-23 LAB — ECHOCARDIOGRAM COMPLETE
Area-P 1/2: 2.94 cm2
Height: 74 in
S' Lateral: 2.8 cm
Weight: 3969.6 oz

## 2021-05-23 LAB — BASIC METABOLIC PANEL
Anion gap: 10 (ref 5–15)
BUN: 11 mg/dL (ref 8–23)
CO2: 24 mmol/L (ref 22–32)
Calcium: 8.9 mg/dL (ref 8.9–10.3)
Chloride: 101 mmol/L (ref 98–111)
Creatinine, Ser: 1.44 mg/dL — ABNORMAL HIGH (ref 0.61–1.24)
GFR, Estimated: 50 mL/min — ABNORMAL LOW (ref >60)
Glucose, Bld: 86 mg/dL (ref 70–99)
Potassium: 4 mmol/L (ref 3.5–5.1)
Sodium: 135 mmol/L (ref 135–145)

## 2021-05-23 LAB — HEPARIN LEVEL (UNFRACTIONATED)
Heparin Unfractionated: 0.61 IU/mL (ref 0.30–0.70)
Heparin Unfractionated: 0.61 IU/mL (ref 0.30–0.70)

## 2021-05-23 MED ORDER — METOPROLOL TARTRATE 25 MG PO TABS
25.0000 mg | ORAL_TABLET | Freq: Two times a day (BID) | ORAL | Status: DC
  Administered 2021-05-23: 25 mg via ORAL
  Filled 2021-05-23 (×2): qty 1

## 2021-05-23 MED ORDER — PERFLUTREN LIPID MICROSPHERE
1.0000 mL | INTRAVENOUS | Status: AC | PRN
Start: 2021-05-23 — End: 2021-05-23
  Administered 2021-05-23: 2 mL via INTRAVENOUS
  Filled 2021-05-23: qty 10

## 2021-05-23 NOTE — Progress Notes (Signed)
PROGRESS NOTE   Shane [name]  QJJ:[id number]    DOB: Oct 31, 1945    DOA: 05/21/2021  PCP: Pcp, No   I have briefly reviewed patients previous medical records in [facility].  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  Recently tested positive for COVID-19 on 11/15 at the [facility] hospital but not treated with meds.  Patient reportedly left [facility] on 9/27 and family trying to get him into an assisted living facility, currently lives alone at home with friend/family intermittently checking on him.  It appears that patient may not have been fully compliant with his Eliquis.  CTA chest showed small segmental pulmonary artery emboli in the lower lobes bilaterally, right heart strain could not be excluded.  Started on IV heparin.  Hematology was consulted and recommended IV heparin x5 days.   Assessment & Plan:  Principal Problem:   Acute pulmonary embolism (HCC) Active Problems:   Microcytic anemia   GERD   PTSD (post-traumatic stress disorder)   Sleep apnea   Essential hypertension   History of DVT (deep vein thrombosis)   Chronic anticoagulation   History of GI bleed   COVID-19 virus infection   Acute pulmonary embolism - CTA chest showed small segmental pulmonary artery emboli in the lower lobes bilaterally and right heart strain could not be excluded -2D echo 11/23: LVEF 70-75%, LV hyperdynamic function, no regional wall motion abnormalities, mild LVH.  RV poorly visualized but grossly normal size and systolic function. -Lower extremity venous Dopplers pending. -It appears that patient was on Eliquis anticoagulation for history of bilateral DVT initially diagnosed in June 2020  but unclear why he still remains on it this far out. -Hematology consultation appreciated.  Recommend indefinite anticoagulation therapy.  Also confirmed with Dr. Benay Spice and he recommends IV heparin anticoagulation x5 days prior to converting to apixaban or Xarelto. -Recent COVID-19 infection and noncompliance with Eliquis may have contributed. -Patient will need to follow-up with his PCP regarding need for continuation of aspirin while on anticoagulation with DOAC's. -Follow-up hemoglobin electrophoresis and soluble transferrin receptor results.  Recent COVID-19 infection -Reportedly diagnosed on 05/15/2021 at the [facility]. -No symptoms pertaining to this currently.  Not hypoxic. -No significant elevation of inflammatory markers. -Remains on negative pressure isolation.  Asthma -Stable without exacerbation.  Continue Advair substituted medicine.  Microcytic hypochromic anemia -Outpatient follow-up and evaluation as deemed necessary. -Stable.  Chronic diastolic CHF -Clinically euvolemic.  Essential hypertension -Continue metoprolol.  Acute kidney injury  -Creat has bumped up from 1.19-1.44. -Has already received today's dose of Lasix.  Hold for now and recheck BMP in AM.  Also soft blood pressures-we will add holding parameters to metoprolol.  Did get contrast for CTA chest. -Avoid nephrotoxic's.  History of GI bleed secondary to gastric ulcers -Underwent EGD by Dr. Bryan Lemma 5/3 which revealed oozing gastric ulcers treated with clips, nonobstructive Schatzki's ring, gastritis, duodenitis and single duodenal polyp that was resected and clips placed. -Continue Protonix -Continue anticoagulation with close monitoring.  Hopefully he does not have an anticoagulation limiting GI bleed.  Hyperlipidemia -Continue statins  Anxiety/depression/PTSD -Reportedly recently started on mood medications last week  Incidental findings noted on CTA chest that will need to be followed  closely as outpatient: CT noted ascending aorta measuring up to 4 cm, right middle lobe pulmonary nodule unchanged from prior, small hiatal hernia, concern for pulmonary artery hypertension, and cardiomegaly with coronary artery calcifications. -Recommend screening for AAA  Body mass index is 31.85 kg/m.    DVT prophylaxis:   Currently on IV heparin   Code Status: DNR Family Communication: None at bedside Disposition:  Status is: Observation  The patient will require care spanning > 2 midnights and should be moved to inpatient because: Will remain on IV heparin for a total of 5 days as per hematology recommendation       Consultants:   Hematology  Procedures:   None  Antimicrobials:      Subjective:  Seen this morning.  Denied complaints.  Poor historian.  No dyspnea or chest pain reported  Objective:   Vitals:   05/23/21 0306 05/23/21 0730 05/23/21 1200 05/23/21 1603  BP:  108/74 (!) 84/54 94/60  Pulse:  74 85 63  Resp:  14 14 16   Temp:  98.3 F (36.8 C) 98.3 F (36.8 C) 98.4 F (36.9 C)  TempSrc:  Oral Oral Oral  SpO2:      Weight:      Height: 6\' 2"  (1.88 m)       General exam: Elderly male, moderately built and nourished lying comfortably propped up in bed without distress. Respiratory system: Clear to auscultation. Respiratory effort normal. Cardiovascular system: S1 & S2 heard, RRR. No JVD, murmurs, rubs, gallops or clicks. No pedal edema.  Telemetry personally reviewed: Sinus rhythm.  Occasional sinus bradycardia in the 50s. Gastrointestinal system: Abdomen is nondistended, soft and nontender. No organomegaly or masses felt. Normal bowel sounds heard. Central nervous system: Alert and oriented x2. No focal neurological deficits. Extremities: Symmetric 5 x 5 power. Skin: No rashes, lesions or ulcers Psychiatry: Judgement and insight appear impaired. Mood & affect appropriate.     Data Reviewed:   I have personally reviewed following labs and imaging  studies   CBC: Recent Labs  Lab 05/21/21 2034 05/22/21 0916 05/23/21 0735  WBC 6.2 6.5 6.4  NEUTROABS 3.9  --   --   HGB 12.1* 12.2* 11.5*  HCT 39.0 39.4 36.0*  MCV 64.0* 64.0* 63.3*  PLT 207 201 128    Basic Metabolic Panel: Recent Labs  Lab 05/21/21 2034 05/22/21 0916 05/23/21 0735  NA 138  --  135  K 3.9  --  4.0  CL 101  --  101  CO2 26  --  24  GLUCOSE 109*  --  86  BUN 7*  --  11  CREATININE 1.19  --  1.44*  CALCIUM 9.5  --  8.9  MG  --  1.8  --   PHOS  --  2.8  --     Liver Function Tests: Recent Labs  Lab 05/21/21 2034  AST 18  ALT 11  ALKPHOS 80  BILITOT 0.8  PROT 7.4  ALBUMIN 3.9    CBG: No results for input(s): GLUCAP in the last 168 hours.  Microbiology Studies:   Recent Results (from the past 240 hour(s))  Resp Panel by RT-PCR (Flu A&B, Covid) Urine, Clean Catch     Status: Abnormal   Collection Time: 05/21/21  8:18 PM   Specimen: Urine, Clean Catch; Nasopharyngeal(NP) swabs in vial transport medium  Result Value Ref Range Status   SARS Coronavirus 2 by RT PCR POSITIVE (A) NEGATIVE Final    Comment: RESULT CALLED TO, READ  BACK BY AND VERIFIED WITH: RN BOBBY S. 05/21/21@22 :16 BY TW (NOTE) SARS-CoV-2 target nucleic acids are DETECTED.  The SARS-CoV-2 RNA is generally detectable in upper respiratory specimens during the acute phase of infection. Positive results are indicative of the presence of the identified virus, but do not rule out bacterial infection or co-infection with other pathogens not detected by the test. Clinical correlation with patient history and other diagnostic information is necessary to determine patient infection status. The expected result is Negative.  Fact Sheet for Patients: EntrepreneurPulse.com.au  Fact Sheet for Healthcare Providers: IncredibleEmployment.be  This test is not yet approved or cleared by the [place] FDA and  has been authorized for detection  and/or diagnosis of SARS-CoV-2 by FDA under an Emergency Use Authorization (EUA).  This EUA will remain in effect (meaning this test can be  used) for the duration of  the COVID-19 declaration under Section 564(b)(1) of the Act, 21 U.S.C. section 360bbb-3(b)(1), unless the authorization is terminated or revoked sooner.     Influenza A by PCR NEGATIVE NEGATIVE Final   Influenza B by PCR NEGATIVE NEGATIVE Final    Comment: (NOTE) The Xpert Xpress SARS-CoV-2/FLU/RSV plus assay is intended as an aid in the diagnosis of influenza from Nasopharyngeal swab specimens and should not be used as a sole basis for treatment. Nasal washings and aspirates are unacceptable for Xpert Xpress SARS-CoV-2/FLU/RSV testing.  Fact Sheet for Patients: EntrepreneurPulse.com.au  Fact Sheet for Healthcare Providers: IncredibleEmployment.be  This test is not yet approved or cleared by the [place] FDA and has been authorized for detection and/or diagnosis of SARS-CoV-2 by FDA under an Emergency Use Authorization (EUA). This EUA will remain in effect (meaning this test can be used) for the duration of the COVID-19 declaration under Section 564(b)(1) of the Act, 21 U.S.C. section 360bbb-3(b)(1), unless the authorization is terminated or revoked.  Performed at [facility], [place] [address], [place], [place] [address]     Radiology Studies:  DG Chest 2 View  Result Date: 05/21/2021 CLINICAL DATA:  Chest pain. EXAM: CHEST - 2 VIEW COMPARISON:  Chest radiograph dated 10/26/2020. FINDINGS: Minimal bibasilar atelectasis. Right paratracheal density may represent atelectasis related to shallow inspiration or mediastinal vasculature. Developing infiltrate is not excluded. Follow-up recommended. No pleural effusion, pneumothorax. Stable cardiomegaly. Degenerative changes of the spine. No acute osseous pathology. IMPRESSION: Right apical/paratracheal density may represent  atelectasis or infiltrate. Continued follow-up recommended. Electronically Signed   By: Anner Crete M.D.   On: 05/21/2021 21:52   CT Angio Chest Pulmonary Embolism (PE) W or WO Contrast  Addendum Date: 05/22/2021   ADDENDUM REPORT: 05/22/2021 02:13 ADDENDUM: Critical findings were reported to Dr. Waverly Ferrari at 2:11 a.m. Electronically Signed   By: Brett Fairy M.D.   On: 05/22/2021 02:13   Result Date: 05/22/2021 CLINICAL DATA:  Chest and back pain. PE suspected, high probability. EXAM: CT ANGIOGRAPHY CHEST WITH CONTRAST TECHNIQUE: Multidetector CT imaging of the chest was performed using the standard protocol during bolus administration of intravenous contrast. Multiplanar CT image reconstructions and MIPs were obtained to evaluate the vascular anatomy. CONTRAST:  14mL OMNIPAQUE IOHEXOL 350 MG/ML SOLN COMPARISON:  Nov 22, 2020. FINDINGS: Cardiovascular: The heart is normal in size and there is no pericardial effusion. A few scattered coronary artery calcifications are noted. There is mild dilatation of the ascending aorta measuring up to 4.0 cm in diameter. The pulmonary trunk is distended which may be associated with underlying pulmonary artery hypertension. A few tiny subsegmental pulmonary  artery filling defects are noted in the lower lobes bilaterally. The right ventricle is mildly distended which is similar in appearance to the prior exam. Mediastinum/Nodes: No enlarged mediastinal, hilar, or axillary lymph nodes. Thyroid gland, trachea, and esophagus demonstrate no significant findings. Lungs/Pleura: Mild atelectasis is noted bilaterally. There is a 3 mm ground-glass nodule in the right middle lobe, axial image 77. No effusion or pneumothorax. Upper Abdomen: There is a small hiatal hernia. A few scattered calcifications are present in the pancreas which may be related to chronic pancreatitis. There is thickening of the left adrenal gland without evidence of discrete nodule. The right adrenal gland  is within normal limits. Musculoskeletal: Gynecomastia is noted bilaterally. Degenerative changes are present in the thoracic spine. Review of the MIP images confirms the above findings. IMPRESSION: 1. Small segmental pulmonary artery emboli defects in the lower lobes bilaterally. The right ventricle is distended which is similar in appearance to the prior exam and may be chronic, however right heart strain can not be excluded. 2. Distended pulmonary trunk which may be associated with underlying pulmonary artery hypertension. 3. Cardiomegaly with coronary artery calcifications. 4. Mild dilatation of the ascending aorta measuring up to 4.0 cm. Recommend annual imaging followup by CTA or MRA. This recommendation follows 2010 ACCF/AHA/AATS/ACR/ASA/SCA/SCAI/SIR/STS/SVM Guidelines for the Diagnosis and Management of Patients with Thoracic Aortic Disease. Circulation. 2010; 121: J673-A193. Aortic aneurysm NOS (ICD10-I71.9). 5. Small hiatal hernia. 6. Right middle lobe pulmonary nodule, unchanged from the prior exam. Electronically Signed: By: Brett Fairy M.D. On: 05/22/2021 02:05   ECHOCARDIOGRAM COMPLETE  Result Date: 05/23/2021    ECHOCARDIOGRAM REPORT   Patient Name:   Shane [name] Date of Exam: 05/23/2021 Medical Rec #:  [id number]          Height:       74.0 in Accession #:    [id number]         Weight:       248.1 lb Date of Birth:  03-24-1946          BSA:          2.382 m Patient Age:    29 years           BP:           108/74 mmHg Patient Gender: M                  HR:           72 bpm. Exam Location:  Inpatient Procedure: 2D Echo, Cardiac Doppler, Color Doppler and Intracardiac            Opacification Agent Indications:    Pulmonary Embolus I26.09  History:        Patient has prior history of Echocardiogram examinations, most                 recent 10/27/2020. Risk Factors:Sleep Apnea and Current Smoker.                 Bilarteral DVTS. COVID infection. Shortness of breath. Left                 sided  chest pain. GERD.  Sonographer:    Darlina Sicilian RDCS Referring Phys: [phone] Kambrey Hagger D Robey Massmann  Sonographer Comments: Image acquisition challenging due to respiratory motion and Restricted mobility due to chest pain. IMPRESSIONS  1. Techincally difficult study with limited views. Left ventricular ejection fraction, by estimation, is 70 to 75%. The left ventricle has hyperdynamic function.  The left ventricle has no regional wall motion abnormalities. There is mild left ventricular hypertrophy. Left ventricular diastolic parameters were normal.  2. Right ventricule is poorly visualized but grossly normal size and systolic function  3. The mitral valve is normal in structure. No evidence of mitral valve regurgitation.  4. The aortic valve is tricuspid. Aortic valve regurgitation is not visualized. No aortic stenosis is present. FINDINGS  Left Ventricle: Left ventricular ejection fraction, by estimation, is 70 to 75%. The left ventricle has hyperdynamic function. The left ventricle has no regional wall motion abnormalities. Definity contrast agent was given IV to delineate the left ventricular endocardial borders. The left ventricular internal cavity size was normal in size. There is mild left ventricular hypertrophy. Left ventricular diastolic parameters were normal. Right Ventricle: The right ventricular size is not well visualized. Right vetricular wall thickness was not well visualized. Right ventricular systolic function was not well visualized. Left Atrium: Left atrial size was normal in size. Right Atrium: Right atrial size was not well visualized. Pericardium: There is no evidence of pericardial effusion. Mitral Valve: The mitral valve is normal in structure. No evidence of mitral valve regurgitation. Tricuspid Valve: The tricuspid valve is normal in structure. Tricuspid valve regurgitation is trivial. Aortic Valve: The aortic valve is tricuspid. Aortic valve regurgitation is not visualized. No aortic stenosis is  present. Pulmonic Valve: The pulmonic valve was not well visualized. Pulmonic valve regurgitation is trivial. Aorta: The aortic root is normal in size and structure. IAS/Shunts: The interatrial septum was not well visualized.  LEFT VENTRICLE PLAX 2D LVIDd:         4.00 cm   Diastology LVIDs:         2.80 cm   LV e' medial:    9.32 cm/s LV PW:         1.00 cm   LV E/e' medial:  7.9 LV IVS:        1.20 cm   LV e' lateral:   13.70 cm/s LVOT diam:     2.20 cm   LV E/e' lateral: 5.4 LVOT Area:     3.80 cm  RIGHT VENTRICLE RV S prime:     20.70 cm/s TAPSE (M-mode): 2.1 cm LEFT ATRIUM             Index LA Vol (A2C):   49.4 ml 20.74 ml/m LA Vol (A4C):   25.6 ml 10.75 ml/m LA Biplane Vol: 36.9 ml 15.49 ml/m   AORTA Ao Root diam: 3.60 cm MITRAL VALVE MV Area (PHT): 2.94 cm     SHUNTS MV Decel Time: 258 msec     Systemic Diam: 2.20 cm MV E velocity: 73.30 cm/s MV A velocity: 110.00 cm/s MV E/A ratio:  0.67 Oswaldo Milian MD Electronically signed by Oswaldo Milian MD Signature Date/Time: 05/23/2021/4:18:40 PM    Final     Scheduled Meds:    vitamin C  500 mg Oral Daily   aspirin  325 mg Oral Daily   furosemide  20 mg Oral QODAY   metoprolol tartrate  25 mg Oral BID   mometasone-formoterol  2 puff Inhalation BID   pantoprazole  40 mg Oral BID   rosuvastatin  10 mg Oral Daily   sodium chloride flush  3 mL Intravenous Q12H   zinc sulfate  220 mg Oral Daily    Continuous Infusions:    heparin 1,450 Units/hr (05/23/21 0200)     LOS: 0 days     Vernell Leep, MD,  FACP, [facility],  [facility], [facility] (Care Management Physician Certified) [place]  To contact the attending provider between 7A-7P or the covering provider during after hours 7P-7A, please log into the web site [email or web address] and access using universal [place] password for that web site. If you do not have the password, please call the hospital operator.  05/23/2021, 4:57 PM

## 2021-05-23 NOTE — NC FL2 (Signed)
[place] LEVEL OF CARE SCREENING TOOL     IDENTIFICATION  Patient Name: Shane [name] Birthdate: [birth year] Sex: male Admission Date (Current Location): 05/21/2021  [facility] [place] and [place] Number:  Herbalist and Address:  [facility], [place] [address], [place], [place] [address]      Provider Number: [id number]  Attending Physician Name and Address:  Modena Jansky, MD  Relative Name and Phone Number:  [name] [phone]    Current Level of Care: Hospital Recommended Level of Care: [place] Prior Approval Number:    Date Approved/Denied:   PASRR Number: [id number]  Discharge Plan: SNF    Current Diagnoses: Patient Active Problem List   Diagnosis Date Noted   Acute pulmonary embolism ([place]) 05/22/2021   History of DVT (deep vein thrombosis) 05/22/2021   Chronic anticoagulation 05/22/2021   History of GI bleed 05/22/2021   COVID-19 virus infection 05/22/2021   Gastritis and gastroduodenitis    Gastric ulcer with hemorrhage    Adenomatous duodenal polyp    Hiatal hernia    Heme positive stool    Acute blood loss anemia    Sepsis with acute renal failure and septic shock ([place]) 10/26/2020   DVT (deep venous thrombosis) ([place]) 09/08/2020   Bilateral leg pain 09/05/2020   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 ([place]) 09/05/2020   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 09/05/2020   Poor tolerance for ambulation 09/05/2020   Effusion of right knee 09/05/2020   Osteoarthritis of right knee 09/05/2020   Essential hypertension 09/05/2020   Noncompliance 09/05/2020   Acquired thrombophilia ([place]) 09/05/2020   Poor personal hygiene 06/18/2020   Stage 3a chronic kidney disease ([place]) 06/18/2020   Diet-controlled diabetes mellitus ([place]) 06/18/2020   Class 2 obesity due to excess calories with body mass index (BMI) of 38.0 to 38.9 in adult 06/18/2020   Back  pain 06/18/2020   Leg swelling 06/04/2020   Educated about COVID-19 virus infection 02/03/2020   Precordial chest pain 02/03/2020   ACS (acute coronary syndrome) ([place]) 12/26/2019   AKI (acute kidney injury) ([place]) 12/26/2019   Moderate persistent asthma 12/25/2019   Right femoral vein DVT ([place]) 03/01/2019   Bradycardia 07/30/2018   Sleep apnea 07/30/2018   Murmur 04/30/2018   Dizziness 04/30/2018   Palpitations 04/06/2016   Elevated blood pressure reading without diagnosis of hypertension 01/12/2016   Sinusitis, chronic 12/19/2015   Pain in joint, upper arm 11/23/2015   Numbness and tingling in left arm 11/23/2015   Asthmatic bronchitis 02/21/2015   PTSD (post-traumatic stress disorder) 02/21/2015   Hemorrhoids 06/15/2014   Microcytic anemia 03/31/2008   GERD 03/31/2008   Impaired fasting glucose 03/31/2008    Orientation RESPIRATION BLADDER Height & Weight     Self, Time, Situation, Place  Normal Continent Weight: 248 lb 1.6 oz (112.5 kg) Height:  6\' 2"  (188 cm)  BEHAVIORAL SYMPTOMS/MOOD NEUROLOGICAL BOWEL NUTRITION STATUS      Continent (WDL) Diet (Please see discharge summary)  AMBULATORY STATUS COMMUNICATION OF NEEDS Skin   Limited Assist Verbally Other (Comment) (Appropriate for ethnicity,Flaky,Dry)                       Personal Care Assistance Level of Assistance  Bathing, Feeding, Dressing Bathing Assistance: Maximum assistance Feeding assistance: Independent (able to feed self) Dressing Assistance: Maximum assistance     Functional Limitations Info  Sight, Hearing, Speech   Hearing Info: Adequate  Speech Info: Adequate    SPECIAL CARE FACTORS FREQUENCY  PT (By licensed PT), OT (By licensed OT)     PT Frequency: 5x min weekly OT Frequency: 5x min weekly            Contractures Contractures Info: Not present    Additional Factors Info  Code Status, Allergies, Isolation Precautions Code Status Info: DNR Allergies Info: No Known Allergies      Isolation Precautions Info: Covid + 05/21/2021     Current Medications (05/23/2021):  This is the current hospital active medication list Current Facility-Administered Medications  Medication Dose Route Frequency Provider Last Rate Last Admin   acetaminophen (TYLENOL) tablet 650 mg  650 mg Oral Q6H PRN Norval Morton, MD       Or   acetaminophen (TYLENOL) suppository 650 mg  650 mg Rectal Q6H PRN Fuller Plan A, MD       albuterol (PROVENTIL) (2.5 MG/3ML) 0.083% nebulizer solution 2.5 mg  2.5 mg Inhalation Q6H PRN Norval Morton, MD       ascorbic acid (VITAMIN C) tablet 500 mg  500 mg Oral Daily Tamala Julian, Rondell A, MD   500 mg at 05/23/21 1054   aspirin tablet 325 mg  325 mg Oral Daily Sherrill Raring, PA-C   325 mg at 05/23/21 1054   chlorpheniramine-HYDROcodone (TUSSIONEX) 10-8 MG/5ML suspension 5 mL  5 mL Oral Q12H PRN Fuller Plan A, MD       furosemide (LASIX) tablet 20 mg  20 mg Oral QODAY Smith, Rondell A, MD   20 mg at 05/22/21 1747   guaiFENesin-dextromethorphan (ROBITUSSIN DM) 100-10 MG/5ML syrup 10 mL  10 mL Oral Q4H PRN Fuller Plan A, MD       heparin ADULT infusion 100 units/mL (25000 units/232mL)  1,450 Units/hr Intravenous Continuous Erenest Blank, RPH 14.5 mL/hr at 05/23/21 0200 1,450 Units/hr at 05/23/21 0200   metoprolol tartrate (LOPRESSOR) tablet 25 mg  25 mg Oral BID Fuller Plan A, MD   25 mg at 05/23/21 1054   mometasone-formoterol (DULERA) 200-5 MCG/ACT inhaler 2 puff  2 puff Inhalation BID Fuller Plan A, MD   2 puff at 05/23/21 1304   ondansetron (ZOFRAN) tablet 4 mg  4 mg Oral Q6H PRN Fuller Plan A, MD       Or   ondansetron (ZOFRAN) injection 4 mg  4 mg Intravenous Q6H PRN Smith, Rondell A, MD       oxyCODONE (Oxy IR/ROXICODONE) immediate release tablet 5 mg  5 mg Oral Q6H PRN Fuller Plan A, MD   5 mg at 05/22/21 1752   pantoprazole (PROTONIX) EC tablet 40 mg  40 mg Oral BID Fuller Plan A, MD   40 mg at 05/23/21 1053   polyethylene glycol  (MIRALAX / GLYCOLAX) packet 17 g  17 g Oral Daily PRN Fuller Plan A, MD       rosuvastatin (CRESTOR) tablet 10 mg  10 mg Oral Daily Smith, Rondell A, MD   10 mg at 05/23/21 1054   sodium chloride flush (NS) 0.9 % injection 3 mL  3 mL Intravenous Q12H Smith, Rondell A, MD   3 mL at 05/23/21 1054   zinc sulfate capsule 220 mg  220 mg Oral Daily Fuller Plan A, MD   220 mg at 05/23/21 1054     Discharge Medications: Please see discharge summary for a list of discharge medications.  Relevant Imaging Results:  Relevant Lab Results:   Additional Information [phone], Both Covid Vaccines 2  boosters  Milas Gain, LCSWA

## 2021-05-23 NOTE — Progress Notes (Signed)
IP PROGRESS NOTE  Subjective:   No complaint this morning.  He reports he was taking Eliquis prior to hospital admission and says he missed a "few "doses.  Objective: Vital signs in last 24 hours: Blood pressure 108/74, pulse 74, temperature 98.3 F (36.8 C), temperature source Oral, resp. rate 14, height 6\' 2"  (1.88 m), weight 248 lb 1.6 oz (112.5 kg), SpO2 96 %.  Intake/Output from previous day: 11/22 0701 - 11/23 0700 In: 255.2 [I.V.:255.2] Out: 300 [Urine:300]  Physical Exam:  Abdomen: No hepatosplenomegaly, nontender Extremities: No leg edema Neurologic: Alert, follows commands   Lab Results: Recent Labs    05/22/21 0916 05/23/21 0735  WBC 6.5 6.4  HGB 12.2* 11.5*  HCT 39.4 36.0*  PLT 201 207    BMET Recent Labs    05/21/21 2034  NA 138  K 3.9  CL 101  CO2 26  GLUCOSE 109*  BUN 7*  CREATININE 1.19  CALCIUM 9.5    No results found for: CEA1, CEA, [id number], CA125  Studies/Results: DG Chest 2 View  Result Date: 05/21/2021 CLINICAL DATA:  Chest pain. EXAM: CHEST - 2 VIEW COMPARISON:  Chest radiograph dated 10/26/2020. FINDINGS: Minimal bibasilar atelectasis. Right paratracheal density may represent atelectasis related to shallow inspiration or mediastinal vasculature. Developing infiltrate is not excluded. Follow-up recommended. No pleural effusion, pneumothorax. Stable cardiomegaly. Degenerative changes of the spine. No acute osseous pathology. IMPRESSION: Right apical/paratracheal density may represent atelectasis or infiltrate. Continued follow-up recommended. Electronically Signed   By: Anner Crete M.D.   On: 05/21/2021 21:52   CT Angio Chest Pulmonary Embolism (PE) W or WO Contrast  Addendum Date: 05/22/2021   ADDENDUM REPORT: 05/22/2021 02:13 ADDENDUM: Critical findings were reported to Dr. Waverly Ferrari at 2:11 a.m. Electronically Signed   By: Brett Fairy M.D.   On: 05/22/2021 02:13   Result Date: 05/22/2021 CLINICAL DATA:  Chest and back pain. PE  suspected, high probability. EXAM: CT ANGIOGRAPHY CHEST WITH CONTRAST TECHNIQUE: Multidetector CT imaging of the chest was performed using the standard protocol during bolus administration of intravenous contrast. Multiplanar CT image reconstructions and MIPs were obtained to evaluate the vascular anatomy. CONTRAST:  71mL OMNIPAQUE IOHEXOL 350 MG/ML SOLN COMPARISON:  Nov 22, 2020. FINDINGS: Cardiovascular: The heart is normal in size and there is no pericardial effusion. A few scattered coronary artery calcifications are noted. There is mild dilatation of the ascending aorta measuring up to 4.0 cm in diameter. The pulmonary trunk is distended which may be associated with underlying pulmonary artery hypertension. A few tiny subsegmental pulmonary artery filling defects are noted in the lower lobes bilaterally. The right ventricle is mildly distended which is similar in appearance to the prior exam. Mediastinum/Nodes: No enlarged mediastinal, hilar, or axillary lymph nodes. Thyroid gland, trachea, and esophagus demonstrate no significant findings. Lungs/Pleura: Mild atelectasis is noted bilaterally. There is a 3 mm ground-glass nodule in the right middle lobe, axial image 77. No effusion or pneumothorax. Upper Abdomen: There is a small hiatal hernia. A few scattered calcifications are present in the pancreas which may be related to chronic pancreatitis. There is thickening of the left adrenal gland without evidence of discrete nodule. The right adrenal gland is within normal limits. Musculoskeletal: Gynecomastia is noted bilaterally. Degenerative changes are present in the thoracic spine. Review of the MIP images confirms the above findings. IMPRESSION: 1. Small segmental pulmonary artery emboli defects in the lower lobes bilaterally. The right ventricle is distended which is similar in appearance to the prior exam and  may be chronic, however right heart strain can not be excluded. 2. Distended pulmonary trunk which  may be associated with underlying pulmonary artery hypertension. 3. Cardiomegaly with coronary artery calcifications. 4. Mild dilatation of the ascending aorta measuring up to 4.0 cm. Recommend annual imaging followup by CTA or MRA. This recommendation follows 2010 ACCF/AHA/AATS/ACR/ASA/SCA/SCAI/SIR/STS/SVM Guidelines for the Diagnosis and Management of Patients with Thoracic Aortic Disease. Circulation. 2010; 121: N817-R116. Aortic aneurysm NOS (ICD10-I71.9). 5. Small hiatal hernia. 6. Right middle lobe pulmonary nodule, unchanged from the prior exam. Electronically Signed: By: Brett Fairy M.D. On: 05/22/2021 02:05    Medications: I have reviewed the patient's current medications.  Assessment/Plan:   Acute pulmonary embolism -CTA chest 05/22/2021-small segmental pulmonary artery emboli defects in the lower lobes bilaterally 2.  History of bilateral DVT initially diagnosed June 2020 3.  Mild microcytic anemia -Ferritin 137 on 05/22/2021 4.  COVID-19 infection 5.  Diastolic heart failure 6.  Hypertension 7.  History of GI bleed secondary to gastric ulcer and history of polyps -Status post EGD 10/31/2020 which showed an oozing gastric ulcer, gastritis, duodenitis 8.  Hyperlipidemia 9.  Anxiety/depression/PTSD 10.  OSA on CPAP 11.  Cardiomegaly 12.  Small hiatal hernia 13.  Right middle lobe pulmonary nodule   Shane [name] appears stable.  He is maintained on therapeutic heparin anticoagulation for treatment of bilateral pulmonary embolism.  Leg Dopplers are pending today.  It remains unclear whether he was taking consistent anticoagulation therapy prior to hospital admission.  I recommend indefinite anticoagulation therapy.  I will complete 5 days of therapeutic heparin anticoagulation prior to converting to an outpatient regimen.  A recent COVID-19 infection may have contributed to his risk for recurrent venous thrombosis.  The microcytic anemia may be related to combined iron deficiency and a  thalassemia variant.  Additional laboratory evaluation is pending.  Recommendations: Continue heparin anticoagulation, convert to apixaban or rivaroxaban at discharge Confirm need for aspirin therapy with primary provider Follow-up results of leg Dopplers Follow-up hemoglobin electrophoresis and soluble transferrin receptor results Please call hematology as needed, I will check on him 05/28/2021 if he remains in the hospital.  He can follow-up with his primary provider at discharge.  I will be glad to see him in the hematology clinic as needed.   LOS: 0 days   Betsy Coder, MD   05/23/2021, 8:05 AM

## 2021-05-23 NOTE — H&P (Signed)
Physical Therapy Evaluation Patient Details Name: Shane [name] MRN: [id number] DOB: 28-Apr-1946 Today's Date: 05/23/2021  History of Present Illness  75 yo male with onset of severe SOB a month after dc from SNF was admitted on 11/21 with positive covid test and note PE with chest pain, productive cough and nausea.  PMHx: HTN, DVT's, asthma, anxiety, depression, PTSD, OSA on CPAP, tobacco use,  Clinical Impression  Pt was seen for ck of BP to monitor for orthostatics:    supine BP 76/60, pulse 85, sat 96%;   sitting BP 92/59, pulse 88, sat 79-95%;   return to supine BP 84/54, pulse 93, sat 96%.    Pt is unable to tolerate standing, and when PT asked him to try, he asked to return to bed. Follow along with him to get his activity level up to higher point, with increased standing tolerance, increased LE strength and improved ability to walk on RW as was his PLOF.  Monitor sats, HR, BP to safely manage his increase in mobility.  Pt may need O2 and will observe sats to be sure he tolerates activity.       Recommendations for follow up therapy are one component of a multi-disciplinary discharge planning process, led by the attending physician.  Recommendations may be updated based on patient status, additional functional criteria and insurance authorization.  Follow Up Recommendations Skilled nursing-short term rehab (<3 hours/day)    Assistance Recommended at Discharge Frequent or constant Supervision/Assistance  Functional Status Assessment Patient has had a recent decline in their functional status and demonstrates the ability to make significant improvements in function in a reasonable and predictable amount of time.  Equipment Recommendations  None recommended by PT    Recommendations for Other Services       Precautions / Restrictions Precautions Precautions: Fall;Other (comment) (monitor O2 sats and BP) Precaution Comments: orthostatic, ck sats Restrictions Weight Bearing  Restrictions: No Other Position/Activity Restrictions: orthostatic      Mobility  Bed Mobility Overal bed mobility: Needs Assistance Bed Mobility: Supine to Sit;Sit to Supine     Supine to sit: Min guard;Min assist Sit to supine: Min guard;Min assist   General bed mobility comments: minor help to scoot out and back to bed    Transfers Overall transfer level: Needs assistance                 General transfer comment: pt was too light headed to stand    Ambulation/Gait               General Gait Details: unable to tolerate  Stairs            Wheelchair Mobility    Modified Rankin (Stroke Patients Only)       Balance Overall balance assessment: Needs assistance Sitting-balance support: Feet supported Sitting balance-Leahy Scale: Fair                                       Pertinent Vitals/Pain Pain Assessment: No/denies pain    Home Living Family/patient expects to be discharged to:: Private residence Living Arrangements: Alone Available Help at Discharge: Other (Comment) Type of Home: House Home Access: Ramped entrance       Home Layout: One level Home Equipment: Conservation officer, nature (2 wheels);Cane - single point;Shower seat;Grab bars - tub/shower;Hand held shower head;Wheelchair - manual Additional Comments: pt reports he lives alone in 1 story home  however per chart EMS found him in his SUV filled with garbage and suspect he's been living in his car however he won't admit that    Prior Function Prior Level of Function : Needs assist             Mobility Comments: Pt required no help to walk but did get help to assist bathing and dressing as well as regular daily other caregiver help       Hand Dominance   Dominant Hand: Right    Extremity/Trunk Assessment   Upper Extremity Assessment Upper Extremity Assessment: Overall WFL for tasks assessed    Lower Extremity Assessment Lower Extremity Assessment:  Generalized weakness    Cervical / Trunk Assessment Cervical / Trunk Assessment: Kyphotic (mild)  Communication   Communication: No difficulties  Cognition Arousal/Alertness: Awake/alert Behavior During Therapy: Anxious Overall Cognitive Status: Within Functional Limits for tasks assessed                                 General Comments: tasks are effortful for pt who is quite fatigued to just sit up as well as orthostatic and hypoxic        General Comments General comments (skin integrity, edema, etc.): pt is on side of bed with variables of BP and sats affecting him with light headed feeling:  supine BP 76/60, pulse 85, sat 96%;  sitting BP 92/59, pulse 88, sat 79-95%;  return to supine BP 84/54, pulse 93, sat 96%    Exercises     Assessment/Plan    PT Assessment Patient needs continued PT services  PT Problem List Cardiopulmonary status limiting activity;Decreased strength;Decreased balance;Decreased activity tolerance       PT Treatment Interventions DME instruction;Gait training;Functional mobility training;Therapeutic activities;Therapeutic exercise;Balance training;Neuromuscular re-education;Patient/family education    PT Goals (Current goals can be found in the Care Plan section)  Acute Rehab PT Goals Patient Stated Goal: to get stronger and not be SOB PT Goal Formulation: With patient Time For Goal Achievement: 05/30/21 Potential to Achieve Goals: Good    Frequency Min 2X/week   Barriers to discharge Decreased caregiver support home alone at times    Co-evaluation               AM-PAC PT "6 Clicks" Mobility  Outcome Measure Help needed turning from your back to your side while in a flat bed without using bedrails?: A Little Help needed moving from lying on your back to sitting on the side of a flat bed without using bedrails?: A Little Help needed moving to and from a bed to a chair (including a wheelchair)?: A Little Help needed  standing up from a chair using your arms (e.g., wheelchair or bedside chair)?: A Lot Help needed to walk in hospital room?: Total Help needed climbing 3-5 steps with a railing? : Total 6 Click Score: 13    End of Session Equipment Utilized During Treatment: Gait belt Activity Tolerance: Patient limited by fatigue;Treatment limited secondary to medical complications (Comment) Patient left: in bed;with call bell/phone within reach;with bed alarm set Nurse Communication: Mobility status;Other (comment) (monitor BP and sats, HR) PT Visit Diagnosis: Muscle weakness (generalized) (M62.81);Difficulty in walking, not elsewhere classified (R26.2)    Time: 8119-1478 PT Time Calculation (min) (ACUTE ONLY): 57 min   Charges:   PT Evaluation $PT Eval Moderate Complexity: 1 Mod PT Treatments $Therapeutic Exercise: 8-22 mins $Therapeutic Activity: 23-37 mins  Ramond Dial 05/23/2021, 1:11 PM  Mee Hives, PT PhD Acute Rehab Dept. Number: [place] and [place]

## 2021-05-23 NOTE — Progress Notes (Signed)
PT Cancellation Note  Patient Details Name: Shane [name] MRN: [id number] DOB: [birth year]   Cancelled Treatment:    Reason Eval/Treat Not Completed: Other (comment).  Awaiting clearance on PE.   Ramond Dial 05/23/2021, 10:20 AM  Mee Hives, PT PhD Acute Rehab Dept. Number: [place] [facility] [place]

## 2021-05-23 NOTE — Care Management (Signed)
[id number] 05-23-21 Case Manager received a call from [place] Liaison that the patient is currently active with disciplines Registered Nurse, Physical/Occupational Therapies and an Aide. Patient will need resumption orders if home continues to be the plan. Case Manager will continue to follow for additional transition of care needs.

## 2021-05-23 NOTE — TOC Initial Note (Signed)
Transition of Care [facility]) - Initial/Assessment Note    Patient Details  Name: Shane [name] MRN: [id number] Date of Birth: [birth year]  [facility]) CM/SW Contact:    Milas Gain, [place] Phone Number: 05/23/2021, 2:07 PM  Clinical Narrative:                  CSW received consult for possible SNF placement at time of discharge. CSW spoke with patient regarding PT recommendation of SNF placement at time of discharge. Patient reports he comes from home alone.Patient expressed understanding of PT recommendation and is agreeable to SNF placement at time of discharge. Patient gave CSW permission to fax out initial referral near the [facility] area. CSW discussed insurance authorization process with patient.Patient reports he has received the COVID vaccines as well as two boosters. No further questions reported at this time. CSW to continue to follow and assist with discharge planning needs.   Expected Discharge Plan: Skilled Nursing Facility Barriers to Discharge: Continued Medical Work up   Patient Goals and CMS Choice Patient states their goals for this hospitalization and ongoing recovery are:: SNF CMS Medicare.gov Compare Post Acute Care list provided to:: Patient Choice offered to / list presented to : Patient  Expected Discharge Plan and Services Expected Discharge Plan: [place] In-house Referral: Clinical Social Work     Living arrangements for the past 2 months: [place]                                      Prior Living Arrangements/Services Living arrangements for the past 2 months: Single Family Home Lives with:: Self Patient language and need for interpreter reviewed:: Yes Do you feel safe going back to the place where you live?: No   SNF  Need for Family Participation in Patient Care: Yes (Comment) Care giver support system in place?: Yes (comment)   Criminal Activity/Legal Involvement Pertinent to  Current Situation/Hospitalization: No - Comment as needed  Activities of Daily Living Home Assistive Devices/Equipment: Walker (specify type) ADL Screening (condition at time of admission) Patient's cognitive ability adequate to safely complete daily activities?: Yes Is the patient deaf or have difficulty hearing?: Yes Does the patient have difficulty seeing, even when wearing glasses/contacts?: No Does the patient have difficulty concentrating, remembering, or making decisions?: No Patient able to express need for assistance with ADLs?: Yes Does the patient have difficulty dressing or bathing?: Yes Independently performs ADLs?: No Communication: Independent Dressing (OT): Needs assistance Does the patient have difficulty walking or climbing stairs?: Yes Weakness of Legs: None Weakness of Arms/Hands: None  Permission Sought/Granted Permission sought to share information with : Case Manager, Family Supports, Chartered certified accountant granted to share information with : Yes, Verbal Permission Granted  Share Information with NAME: [name]  Permission granted to share info w AGENCY: SNF  Permission granted to share info w Relationship: brother  Permission granted to share info w Contact Information: [name] [phone]  Emotional Assessment   Attitude/Demeanor/Rapport: Gracious Affect (typically observed): Calm Orientation: : Oriented to Self, Oriented to Place, Oriented to  Time, Oriented to Situation Alcohol / Substance Use: Not Applicable Psych Involvement: No (comment)  Admission diagnosis:  Acute pulmonary embolism ([place]) [I26.99] Acute pulmonary embolism, unspecified pulmonary embolism type, unspecified whether acute cor pulmonale present [facility]) [I26.99] Patient Active Problem List   Diagnosis Date Noted   Acute pulmonary embolism ([place]) 05/22/2021  History of DVT (deep vein thrombosis) 05/22/2021   Chronic anticoagulation 05/22/2021   History of GI bleed  05/22/2021   COVID-19 virus infection 05/22/2021   Gastritis and gastroduodenitis    Gastric ulcer with hemorrhage    Adenomatous duodenal polyp    Hiatal hernia    Heme positive stool    Acute blood loss anemia    Sepsis with acute renal failure and septic shock ([place]) 10/26/2020   DVT (deep venous thrombosis) ([place]) 09/08/2020   Bilateral leg pain 09/05/2020   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 ([place]) 09/05/2020   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 09/05/2020   Poor tolerance for ambulation 09/05/2020   Effusion of right knee 09/05/2020   Osteoarthritis of right knee 09/05/2020   Essential hypertension 09/05/2020   Noncompliance 09/05/2020   Acquired thrombophilia ([place]) 09/05/2020   Poor personal hygiene 06/18/2020   Stage 3a chronic kidney disease ([place]) 06/18/2020   Diet-controlled diabetes mellitus ([place]) 06/18/2020   Class 2 obesity due to excess calories with body mass index (BMI) of 38.0 to 38.9 in adult 06/18/2020   Back pain 06/18/2020   Leg swelling 06/04/2020   Educated about COVID-19 virus infection 02/03/2020   Precordial chest pain 02/03/2020   ACS (acute coronary syndrome) ([place]) 12/26/2019   AKI (acute kidney injury) ([place]) 12/26/2019   Moderate persistent asthma 12/25/2019   Right femoral vein DVT ([place]) 03/01/2019   Bradycardia 07/30/2018   Sleep apnea 07/30/2018   Murmur 04/30/2018   Dizziness 04/30/2018   Palpitations 04/06/2016   Elevated blood pressure reading without diagnosis of hypertension 01/12/2016   Sinusitis, chronic 12/19/2015   Pain in joint, upper arm 11/23/2015   Numbness and tingling in left arm 11/23/2015   Asthmatic bronchitis 02/21/2015   PTSD (post-traumatic stress disorder) 02/21/2015   Hemorrhoids 06/15/2014   Microcytic anemia 03/31/2008   GERD 03/31/2008   Impaired fasting glucose 03/31/2008   PCP:  Pcp, No Pharmacy:   [id number] [place] [address] [place] [address] Phone: [phone] Fax: [phone]  [facility] [address] [place] [address] Phone: [phone] Fax: [phone]     Social Determinants of Health (SDOH) Interventions    Readmission Risk Interventions No flowsheet data found.

## 2021-05-23 NOTE — Progress Notes (Signed)
ANTICOAGULATION CONSULT NOTE   Pharmacy Consult for Heparin Indication: pulmonary embolus  No Known Allergies  Patient Measurements: Height: 6\' 2"  (188 cm) Weight: 112.5 kg (248 lb 1.6 oz) IBW/kg (Calculated) : 82.2 Heparin Dosing Weight: 110 kg  Vital Signs: Temp: 98.3 F (36.8 C) (11/23 0730) Temp Source: Oral (11/23 0730) BP: 108/74 (11/23 0730) Pulse Rate: 74 (11/23 0730)  Labs: Recent Labs    05/21/21 2034 05/21/21 2034 05/21/21 2302 05/22/21 0320 05/22/21 0916 05/22/21 1311 05/22/21 2155 05/23/21 0735  HGB 12.1*  --   --   --  12.2*  --   --  11.5*  HCT 39.0  --   --   --  39.4  --   --  36.0*  PLT 207  --   --   --  201  --   --  207  APTT  --   --   --  28 137*  --   --   --   LABPROT  --   --   --  16.4*  --   --   --   --   INR  --   --   --  1.3*  --   --   --   --   HEPARINUNFRC  --    < >  --  0.28*  --  0.94* 0.20* 0.61  CREATININE 1.19  --   --   --   --   --   --  1.44*  TROPONINIHS 16  --  12  --   --   --   --   --    < > = values in this interval not displayed.     Estimated Creatinine Clearance: 58.2 mL/min (A) (by C-G formula based on SCr of 1.44 mg/dL (H)).   Medical History: Past Medical History:  Diagnosis Date   Acute asthmatic bronchitis    Anemia    Anxiety disorder    Borderline diabetes mellitus    Borderline hypertension    Chronic back pain    Degenerative joint disease    Depression    GERD (gastroesophageal reflux disease)    History of adenomatous polyp of colon    History of sinusitis    Posttraumatic stress disorder    Sleep apnea    Uses CPAP    Medications:  No current facility-administered medications on file prior to encounter.   Current Outpatient Medications on File Prior to Encounter  Medication Sig Dispense Refill   albuterol (VENTOLIN HFA) 108 (90 Base) MCG/ACT inhaler INHALE TWO PUFFS EVERY FOUR TO SIX HOURS AS NEEDED FOR COUGH OR WHEEZE. (Patient taking differently: Inhale 2 puffs into the lungs  every 4 (four) hours as needed for wheezing (cough).) 8.5 each 0   alum & mag hydroxide-simeth (MYLANTA MAXIMUM STRENGTH) 400-400-40 MG/5ML suspension Take 5 mLs by mouth every 6 (six) hours as needed (abdominal pain or indigestion). 355 mL 0   apixaban (ELIQUIS) 5 MG TABS tablet Take 1 tablet (5 mg total) by mouth 2 (two) times daily. 60 tablet 0   chlorhexidine (PERIDEX) 0.12 % solution 5 mLs by Mouth Rinse route 2 (two) times daily.     divalproex (DEPAKOTE) 125 MG DR tablet Take 125 mg by mouth 2 (two) times daily.     docusate sodium (COLACE) 100 MG capsule Take 1 capsule (100 mg total) by mouth 2 (two) times daily. 10 capsule 0   ferrous sulfate 325 (65 FE) MG tablet Take 1  tablet (325 mg total) by mouth 3 (three) times daily with meals.  3   fluticasone-salmeterol (ADVAIR) 500-50 MCG/ACT AEPB Inhale 1 puff into the lungs in the morning and at bedtime.     furosemide (LASIX) 20 MG tablet Take 20 mg by mouth 2 (two) times daily.     furosemide (LASIX) 40 MG tablet Take 0.5 tablets (20 mg total) by mouth every other day. 30 tablet 10   HYDROcodone-acetaminophen (NORCO) 5-325 MG tablet Take 1 tablet by mouth every 6 (six) hours as needed for moderate pain. 10 tablet 0   metoprolol tartrate (LOPRESSOR) 25 MG tablet Take 1 tablet (25 mg total) by mouth 2 (two) times daily.     montelukast (SINGULAIR) 10 MG tablet TAKE 1 TABLET BY MOUTH EVERYDAY AT BEDTIME (Patient taking differently: Take 10 mg by mouth at bedtime.) 30 tablet 0   omeprazole (PRILOSEC) 20 MG capsule Take 20 mg by mouth 2 (two) times daily.     pantoprazole (PROTONIX) 40 MG tablet Take 1 tablet (40 mg total) by mouth 2 (two) times daily.     phenazopyridine (PYRIDIUM) 100 MG tablet Take 100 mg by mouth 3 (three) times daily with meals. For pain     polyethylene glycol (MIRALAX / GLYCOLAX) 17 g packet Take 17 g by mouth daily as needed for moderate constipation. 14 each 0   Propylene Glycol (SYSTANE BALANCE OP) Place 1 drop into both  eyes daily as needed (for dry eyes).     rosuvastatin (CRESTOR) 10 MG tablet Take 1 tablet (10 mg total) by mouth daily.     tamsulosin (FLOMAX) 0.4 MG CAPS capsule Take 0.4 mg by mouth daily.     triamcinolone (NASACORT ALLERGY 24HR) 55 MCG/ACT AERO nasal inhaler Use one spray in each nostril once daily as directed. (Patient taking differently: Place 1 spray into the nose daily.) 1 Inhaler 5     Assessment: 75 y.o. male with H/O DVT on Eliquis admitted with new PE, for heparin.   Heparin level this morning is now at goal on 1450 units/hr. Hgb down slightly from 12.2 to 11.5, plt stable in 200s. No bleeding or IV issues noted.   Goal of Therapy:  Heparin level 0.3-0.7 units/ml Monitor platelets by anticoagulation protocol: Yes   Plan:  Continue heparin at 1450 units/hr Re-check heparin level in 8 hours to confirm  Erin Hearing PharmD., BCPS Clinical Pharmacist 05/23/2021 8:57 AM

## 2021-05-23 NOTE — Care Management Obs Status (Signed)
[facility] NOTIFICATION   Patient Details  Name: Shane [name] MRN: [id number] Date of Birth: [birth year]   Medicare Observation Status Notification Given:  Yes  Notice given  via phone permission to sign that received notice.   Verdell Carmine, RN 05/23/2021, 11:28 AM

## 2021-05-23 NOTE — Plan of Care (Signed)
  Problem: Education: Goal: Knowledge of General Education information will improve Description Including pain rating scale, medication(s)/side effects and non-pharmacologic comfort measures Outcome: Progressing   Problem: Health Behavior/Discharge Planning: Goal: Ability to manage health-related needs will improve Outcome: Progressing   

## 2021-05-23 NOTE — Progress Notes (Signed)
Pt refused CPAP for the night. Vitals are stable and pt not ion any distress. Did complain of some shortness or breath from earlier but no other issues noted. RT will monitor as needed.

## 2021-05-23 NOTE — Plan of Care (Signed)
  Problem: Health Behavior/Discharge Planning: Goal: Ability to manage health-related needs will improve Outcome: Progressing   Problem: Clinical Measurements: Goal: Ability to maintain clinical measurements within normal limits will improve Outcome: Progressing   Problem: Clinical Measurements: Goal: Respiratory complications will improve Outcome: Progressing   Problem: Clinical Measurements: Goal: Cardiovascular complication will be avoided Outcome: Progressing   Problem: Nutrition: Goal: Adequate nutrition will be maintained Outcome: Progressing   Problem: Coping: Goal: Level of anxiety will decrease Outcome: Progressing   Problem: Pain Managment: Goal: General experience of comfort will improve Outcome: Progressing   Problem: Safety: Goal: Ability to remain free from injury will improve Outcome: Progressing   Problem: Respiratory: Goal: Will maintain a patent airway Outcome: Progressing   Problem: Skin Integrity: Goal: Risk for impaired skin integrity will decrease Outcome: Progressing

## 2021-05-23 NOTE — Progress Notes (Signed)
ANTICOAGULATION CONSULT NOTE   Pharmacy Consult for Heparin Indication: pulmonary embolus  No Known Allergies  Patient Measurements: Height: 6\' 2"  (188 cm) Weight: 112.5 kg (248 lb 1.6 oz) IBW/kg (Calculated) : 82.2 Heparin Dosing Weight: 110 kg  Vital Signs: Temp: 98.Shane F (36.9 C) (11/23 1603) Temp Source: Oral (11/23 1603) BP: 94/60 (11/23 1603) Pulse Rate: 63 (11/23 1603)  Labs: Recent Labs    05/21/21 2034 05/21/21 2302 05/22/21 0320 05/22/21 0916 05/22/21 1311 05/22/21 2155 05/23/21 0735 05/23/21 1609  HGB 12.1*  --   --  12.2*  --   --  11.5*  --   HCT 39.0  --   --  39.Shane  --   --  36.0*  --   PLT 207  --   --  201  --   --  207  --   APTT  --   --  28 137*  --   --   --   --   LABPROT  --   --  16.Shane*  --   --   --   --   --   INR  --   --  1.3*  --   --   --   --   --   HEPARINUNFRC  --   --  0.28*  --    < > 0.20* 0.61 0.61  CREATININE 1.19  --   --   --   --   --  1.44*  --   TROPONINIHS 16 12  --   --   --   --   --   --    < > = values in this interval not displayed.     Estimated Creatinine Clearance: 58.2 mL/min (A) (by C-G formula based on SCr of 1.44 mg/dL (H)).   Medical History: Past Medical History:  Diagnosis Date   Acute asthmatic bronchitis    Anemia    Anxiety disorder    Borderline diabetes mellitus    Borderline hypertension    Chronic back pain    Degenerative joint disease    Depression    GERD (gastroesophageal reflux disease)    History of adenomatous polyp of colon    History of sinusitis    Posttraumatic stress disorder    Sleep apnea    Uses CPAP    Medications:  No current facility-administered medications on file prior to encounter.   Current Outpatient Medications on File Prior to Encounter  Medication Sig Dispense Refill   albuterol (VENTOLIN HFA) 108 (90 Base) MCG/ACT inhaler INHALE TWO PUFFS EVERY FOUR TO SIX HOURS AS NEEDED FOR COUGH OR WHEEZE. (Patient taking differently: Inhale 2 puffs into the lungs every  Shane (four) hours as needed for wheezing (cough).) 8.5 each 0   alum & mag hydroxide-simeth (MYLANTA MAXIMUM STRENGTH) 400-400-40 MG/5ML suspension Take 5 mLs by mouth every 6 (six) hours as needed (abdominal pain or indigestion). 355 mL 0   apixaban (ELIQUIS) 5 MG TABS tablet Take 1 tablet (5 mg total) by mouth 2 (two) times daily. 60 tablet 0   chlorhexidine (PERIDEX) 0.12 % solution 5 mLs by Mouth Rinse route 2 (two) times daily.     divalproex (DEPAKOTE) 125 MG DR tablet Take 125 mg by mouth 2 (two) times daily.     docusate sodium (COLACE) 100 MG capsule Take 1 capsule (100 mg total) by mouth 2 (two) times daily. 10 capsule 0   ferrous sulfate 325 (65 FE) MG tablet Take 1  tablet (325 mg total) by mouth 3 (three) times daily with meals.  3   fluticasone-salmeterol (ADVAIR) 500-50 MCG/ACT AEPB Inhale 1 puff into the lungs in the morning and at bedtime.     furosemide (LASIX) 20 MG tablet Take 20 mg by mouth 2 (two) times daily.     furosemide (LASIX) 40 MG tablet Take 0.5 tablets (20 mg total) by mouth every other day. 30 tablet 10   HYDROcodone-acetaminophen (NORCO) 5-325 MG tablet Take 1 tablet by mouth every 6 (six) hours as needed for moderate pain. 10 tablet 0   metoprolol tartrate (LOPRESSOR) 25 MG tablet Take 1 tablet (25 mg total) by mouth 2 (two) times daily.     montelukast (SINGULAIR) 10 MG tablet TAKE 1 TABLET BY MOUTH EVERYDAY AT BEDTIME (Patient taking differently: Take 10 mg by mouth at bedtime.) 30 tablet 0   omeprazole (PRILOSEC) 20 MG capsule Take 20 mg by mouth 2 (two) times daily.     pantoprazole (PROTONIX) 40 MG tablet Take 1 tablet (40 mg total) by mouth 2 (two) times daily.     phenazopyridine (PYRIDIUM) 100 MG tablet Take 100 mg by mouth 3 (three) times daily with meals. For pain     polyethylene glycol (MIRALAX / GLYCOLAX) 17 g packet Take 17 g by mouth daily as needed for moderate constipation. 14 each 0   Propylene Glycol (SYSTANE BALANCE OP) Place 1 drop into both eyes  daily as needed (for dry eyes).     rosuvastatin (CRESTOR) 10 MG tablet Take 1 tablet (10 mg total) by mouth daily.     tamsulosin (FLOMAX) 0.Shane MG CAPS capsule Take 0.Shane mg by mouth daily.     triamcinolone (NASACORT ALLERGY 24HR) 55 MCG/ACT AERO nasal inhaler Use one spray in each nostril once daily as directed. (Patient taking differently: Place 1 spray into the nose daily.) 1 Inhaler 5     Assessment: 75 y.o. [name] with H/O DVT on Eliquis admitted with new PE, for heparin.   Confirmatory heparin level of 0.61 is therapeutic on heparin 1450 units/hr.   Goal of Therapy:  Heparin level 0.3-0.7 units/ml Monitor platelets by anticoagulation protocol: Yes   Plan:  Continue heparin at 1450 units/hr Monitor daily heparin level, CBC and s/s of bleeding  Follow up transition to oral anticoagulant   Cristela Felt, PharmD, BCPS Clinical Pharmacist 05/23/2021 5:33 PM

## 2021-05-24 LAB — BASIC METABOLIC PANEL
Anion gap: 8 (ref 5–15)
BUN: 19 mg/dL (ref 8–23)
CO2: 25 mmol/L (ref 22–32)
Calcium: 8.5 mg/dL — ABNORMAL LOW (ref 8.9–10.3)
Chloride: 103 mmol/L (ref 98–111)
Creatinine, Ser: 1.63 mg/dL — ABNORMAL HIGH (ref 0.61–1.24)
GFR, Estimated: 43 mL/min — ABNORMAL LOW (ref >60)
Glucose, Bld: 95 mg/dL (ref 70–99)
Potassium: 3.8 mmol/L (ref 3.5–5.1)
Sodium: 136 mmol/L (ref 135–145)

## 2021-05-24 LAB — CBC
HCT: 34.6 % — ABNORMAL LOW (ref 39.0–52.0)
Hemoglobin: 10.9 g/dL — ABNORMAL LOW (ref 13.0–17.0)
MCH: 19.7 pg — ABNORMAL LOW (ref 26.0–34.0)
MCHC: 31.5 g/dL (ref 30.0–36.0)
MCV: 62.6 fL — ABNORMAL LOW (ref 80.0–100.0)
Platelets: 193 10*3/uL (ref 150–400)
RBC: 5.53 MIL/uL (ref 4.22–5.81)
RDW: 15.8 % — ABNORMAL HIGH (ref 11.5–15.5)
WBC: 8.1 10*3/uL (ref 4.0–10.5)
nRBC: 0.2 % (ref 0.0–0.2)

## 2021-05-24 LAB — HEPARIN LEVEL (UNFRACTIONATED): Heparin Unfractionated: 0.61 IU/mL (ref 0.30–0.70)

## 2021-05-24 MED ORDER — LACTATED RINGERS IV SOLN: INTRAVENOUS | Status: AC

## 2021-05-24 MED ORDER — METOPROLOL TARTRATE 12.5 MG HALF TABLET
12.5000 mg | ORAL_TABLET | Freq: Two times a day (BID) | ORAL | Status: DC
  Administered 2021-05-24 – 2021-05-25 (×3): 12.5 mg via ORAL
  Filled 2021-05-24 (×4): qty 1

## 2021-05-24 NOTE — Progress Notes (Addendum)
ANTICOAGULATION CONSULT NOTE - Follow Up Consult  Pharmacy Consult for Heparin Indication: pulmonary embolus  No Known Allergies  Patient Measurements: Height: 6\' 2"  (188 cm) Weight: 112.3 kg (247 lb 9.2 oz) IBW/kg (Calculated) : 82.2 Heparin Dosing Weight: 106 kg  Vital Signs: Temp: 98.6 F (37 C) (11/23 2109) Temp Source: Oral (11/23 2109) BP: 108/73 (11/23 2109) Pulse Rate: 67 (11/23 2109)  Labs: Recent Labs    05/21/21 2034 05/21/21 2302 05/22/21 0320 05/22/21 0916 05/22/21 1311 05/23/21 0735 05/23/21 1609 05/24/21 0150  HGB 12.1*  --   --  12.2*  --  11.5*  --  10.9*  HCT 39.0  --   --  39.4  --  36.0*  --  34.6*  PLT 207  --   --  201  --  207  --  193  APTT  --   --  28 137*  --   --   --   --   LABPROT  --   --  16.4*  --   --   --   --   --   INR  --   --  1.3*  --   --   --   --   --   HEPARINUNFRC  --   --  0.28*  --    < > 0.61 0.61 0.61  CREATININE 1.19  --   --   --   --  1.44*  --  1.63*  TROPONINIHS 16 12  --   --   --   --   --   --    < > = values in this interval not displayed.    Estimated Creatinine Clearance: 51.4 mL/min (A) (by C-G formula based on SCr of 1.63 mg/dL (H)).   Medications:  Scheduled:   vitamin C  500 mg Oral Daily   aspirin  325 mg Oral Daily   metoprolol tartrate  25 mg Oral BID   mometasone-formoterol  2 puff Inhalation BID   pantoprazole  40 mg Oral BID   rosuvastatin  10 mg Oral Daily   sodium chloride flush  3 mL Intravenous Q12H   zinc sulfate  220 mg Oral Daily   Infusions:   heparin 1,450 Units/hr (05/24/21 0600)    Assessment: 75 yo M with h/o DVT on Eliquis admitted with new PE. Pharmacy consulted to dose heparin.   Heparin level this morning therapeutic at 0.61, on 1450 units/hr. CBC stable. No line issues or signs/symptoms of bleeding noted per RN.   Goal of Therapy:  Heparin level 0.3-0.7 units/ml Monitor platelets by anticoagulation protocol: Yes   Plan:  Continue heparin at 1450 units/hr Daily  CBC and heparin level Monitor for signs/symptoms of bleeding Follow up transition to oral anticoagulant   Vance Peper, PharmD PGY1 Pharmacy Resident Phone [phone] 05/24/2021 8:36 AM   Please check AMION for all [facility] phone numbers After 10:00 PM, call [facility] [phone]

## 2021-05-24 NOTE — Plan of Care (Signed)
  Problem: Clinical Measurements: Goal: Ability to maintain clinical measurements within normal limits will improve Outcome: Progressing   Problem: Clinical Measurements: Goal: Diagnostic test results will improve Outcome: Progressing   Problem: Clinical Measurements: Goal: Respiratory complications will improve Outcome: Progressing   Problem: Clinical Measurements: Goal: Cardiovascular complication will be avoided Outcome: Progressing   Problem: Activity: Goal: Risk for activity intolerance will decrease Outcome: Progressing   Problem: Nutrition: Goal: Adequate nutrition will be maintained Outcome: Progressing   Problem: Pain Managment: Goal: General experience of comfort will improve Outcome: Progressing   Problem: Safety: Goal: Ability to remain free from injury will improve Outcome: Progressing   Problem: Skin Integrity: Goal: Risk for impaired skin integrity will decrease Outcome: Progressing   Problem: Respiratory: Goal: Complications related to the disease process, condition or treatment will be avoided or minimized Outcome: Progressing

## 2021-05-24 NOTE — Progress Notes (Addendum)
PROGRESS NOTE   Shane [name]  HYW:[id number]    DOB: [birth year]    DOA: 05/21/2021  PCP: Pcp, No   I have briefly reviewed patients previous medical records in [facility].  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  Recently tested positive for COVID-19 on 11/15 at the [facility] hospital but not treated with meds.  Patient reportedly left [facility] on 9/27 and family trying to get him into an assisted living facility, currently lives alone at home with friend/family intermittently checking on him.  It appears that patient may not have been fully compliant with his Eliquis.  CTA chest showed small segmental pulmonary artery emboli in the lower lobes bilaterally, right heart strain could not be excluded.  Started on IV heparin.  Hematology was consulted and recommended IV heparin x5 days.   Assessment & Plan:  Principal Problem:   Acute pulmonary embolism (HCC) Active Problems:   Microcytic anemia   GERD   PTSD (post-traumatic stress disorder)   Sleep apnea   Essential hypertension   History of DVT (deep vein thrombosis)   Chronic anticoagulation   History of GI bleed   COVID-19 virus infection   Acute pulmonary embolism/bilateral lower extremity chronic DVTs - CTA chest showed small segmental pulmonary artery emboli in the lower lobes bilaterally and right heart strain could not be excluded -2D echo 11/23: LVEF 70-75%, LV hyperdynamic function, no regional wall motion abnormalities, mild LVH.  RV poorly visualized but grossly normal size and systolic function. -Lower extremity venous Dopplers confirm bilateral lower extremity chronic DVTs. -It appears that patient was on Eliquis  anticoagulation for history of bilateral DVT initially diagnosed in June 2020 but unclear why he still remains on it this far out. -Hematology consultation appreciated.  Recommend indefinite anticoagulation therapy.  Also confirmed with Dr. Benay Spice and he recommends IV heparin anticoagulation x5 days prior to converting to apixaban or Xarelto.  IV heparin initiated on 11/22 and hence day 3 today. -Recent COVID-19 infection and noncompliance with Eliquis may have contributed. -Patient will need to follow-up with his PCP regarding need for continuation of aspirin while on anticoagulation with DOAC's. -Follow-up hemoglobin electrophoresis and soluble transferrin receptor results. -Hemodynamically stable and not hypoxic  Recent COVID-19 infection -Reportedly diagnosed on 05/15/2021 at the [facility]. -No symptoms pertaining to this currently.  Not hypoxic. -No significant elevation of inflammatory markers. -Remains on negative pressure isolation.  Hopefully should come off of isolation on 11/25.  Asthma -Stable without exacerbation.  Continue Advair substituted medicine.  Microcytic hypochromic anemia -Outpatient follow-up and evaluation as deemed necessary. -Stable.  Chronic diastolic CHF -Clinically euvolemic.  Essential hypertension -Continue metoprolol.  Acute kidney injury  -Creat has bumped up from 1.19-1.44-1.63. -Discontinued Lasix.  Given soft blood pressures, reduced metoprolol dose from 25-12.5 Mg twice daily. Did get contrast for CTA chest. -Avoid nephrotoxic's. -Gentle IV fluids x6 hours.  Check bladder scan to rule out urinary retention.  Follow BMP in AM.  If continues to worsen, consider renal ultrasound.  History of GI bleed secondary to gastric ulcers -Underwent EGD by Dr. Bryan Lemma 5/3 which revealed oozing gastric ulcers treated with clips, nonobstructive Schatzki's ring, gastritis, duodenitis and single duodenal polyp that was resected and  clips placed. -Continue  Protonix -Continue anticoagulation with close monitoring.  Hopefully he does not have an anticoagulation limiting GI bleed.  Hyperlipidemia -Continue statins  Anxiety/depression/PTSD -Reportedly recently started on mood medications last week  Incidental findings noted on CTA chest that will need to be followed closely as outpatient: CT noted ascending aorta measuring up to 4 cm, right middle lobe pulmonary nodule unchanged from prior, small hiatal hernia, concern for pulmonary artery hypertension, and cardiomegaly with coronary artery calcifications. -Recommend screening for AAA  Cognitive impairment - Baseline mental status is not known.  Could have this chronically.?  Dementia.  Body mass index is 31.79 kg/m.    DVT prophylaxis:   Currently on IV heparin   Code Status: DNR Family Communication: None at bedside.  Unable to reach patient's brother via phone, left VM message. Disposition:  Status is: Observation  The patient will require care spanning > 2 midnights and should be moved to inpatient because: Will remain on IV heparin for a total of 5 days as per hematology recommendation       Consultants:   Hematology  Procedures:   None  Antimicrobials:      Subjective:  Poor historian.  Oriented to self and place.  Asking if he can take his medications "over prescribed" because he does not want to take them "under prescribed".  Advised him that he should take the medications just as they have been prescribed.  No chest pain or dyspnea.  Objective:   Vitals:   05/23/21 1200 05/23/21 1603 05/23/21 2109 05/24/21 0500  BP: (!) 84/54 94/60 108/73   Pulse: 85 63 67   Resp: 14 16 16    Temp: 98.3 F (36.8 C) 98.4 F (36.9 C) 98.6 F (37 C)   TempSrc: Oral Oral Oral   SpO2:   95%   Weight:    112.3 kg  Height:        General exam: Elderly male, moderately built and nourished lying comfortably propped up in bed without distress.  Oral mucosa with borderline  hydration. Respiratory system: Clear to auscultation.  No increased work of breathing. Cardiovascular system: S1 and S2 heard, RRR.  No JVD or murmurs.  No pedal edema.  Telemetry personally reviewed-SR-SB in the 50s. Gastrointestinal system: Abdomen is nondistended, soft and nontender. No organomegaly or masses felt. Normal bowel sounds heard. Central nervous system: Alert and oriented x2. No focal neurological deficits. Extremities: Symmetric 5 x 5 power. Skin: No rashes, lesions or ulcers Psychiatry: Judgement and insight appear impaired. Mood & affect appropriate.     Data Reviewed:   I have personally reviewed following labs and imaging studies   CBC: Recent Labs  Lab 05/21/21 2034 05/22/21 0916 05/23/21 0735 05/24/21 0150  WBC 6.2 6.5 6.4 8.1  NEUTROABS 3.9  --   --   --   HGB 12.1* 12.2* 11.5* 10.9*  HCT 39.0 39.4 36.0* 34.6*  MCV 64.0* 64.0* 63.3* 62.6*  PLT 207 201 207 683    Basic Metabolic Panel: Recent Labs  Lab 05/21/21 2034 05/22/21 0916 05/23/21 0735 05/24/21 0150  NA 138  --  135 136  K 3.9  --  4.0 3.8  CL 101  --  101 103  CO2 26  --  24 25  GLUCOSE 109*  --  86 95  BUN 7*  --  11 19  CREATININE 1.19  --  1.44* 1.63*  CALCIUM 9.5  --  8.9 8.5*  MG  --  1.8  --   --  PHOS  --  2.8  --   --     Liver Function Tests: Recent Labs  Lab 05/21/21 2034  AST 18  ALT 11  ALKPHOS 80  BILITOT 0.8  PROT 7.4  ALBUMIN 3.9    CBG: No results for input(s): GLUCAP in the last 168 hours.  Microbiology Studies:   Recent Results (from the past 240 hour(s))  Resp Panel by RT-PCR (Flu A&B, Covid) Urine, Clean Catch     Status: Abnormal   Collection Time: 05/21/21  8:18 PM   Specimen: Urine, Clean Catch; Nasopharyngeal(NP) swabs in vial transport medium  Result Value Ref Range Status   SARS Coronavirus 2 by RT PCR POSITIVE (A) NEGATIVE Final    Comment: RESULT CALLED TO, READ BACK BY AND VERIFIED WITH: RN BOBBY S. 05/21/21@22 :16 BY  TW (NOTE) SARS-CoV-2 target nucleic acids are DETECTED.  The SARS-CoV-2 RNA is generally detectable in upper respiratory specimens during the acute phase of infection. Positive results are indicative of the presence of the identified virus, but do not rule out bacterial infection or co-infection with other pathogens not detected by the test. Clinical correlation with patient history and other diagnostic information is necessary to determine patient infection status. The expected result is Negative.  Fact Sheet for Patients: EntrepreneurPulse.com.au  Fact Sheet for Healthcare Providers: IncredibleEmployment.be  This test is not yet approved or cleared by the [place] FDA and  has been authorized for detection and/or diagnosis of SARS-CoV-2 by FDA under an Emergency Use Authorization (EUA).  This EUA will remain in effect (meaning this test can be  used) for the duration of  the COVID-19 declaration under Section 564(b)(1) of the Act, 21 U.S.C. section 360bbb-3(b)(1), unless the authorization is terminated or revoked sooner.     Influenza A by PCR NEGATIVE NEGATIVE Final   Influenza B by PCR NEGATIVE NEGATIVE Final    Comment: (NOTE) The Xpert Xpress SARS-CoV-2/FLU/RSV plus assay is intended as an aid in the diagnosis of influenza from Nasopharyngeal swab specimens and should not be used as a sole basis for treatment. Nasal washings and aspirates are unacceptable for Xpert Xpress SARS-CoV-2/FLU/RSV testing.  Fact Sheet for Patients: EntrepreneurPulse.com.au  Fact Sheet for Healthcare Providers: IncredibleEmployment.be  This test is not yet approved or cleared by the [place] FDA and has been authorized for detection and/or diagnosis of SARS-CoV-2 by FDA under an Emergency Use Authorization (EUA). This EUA will remain in effect (meaning this test can be used) for the duration of the COVID-19  declaration under Section 564(b)(1) of the Act, 21 U.S.C. section 360bbb-3(b)(1), unless the authorization is terminated or revoked.  Performed at [facility], [place] [address] [place], [place] [address]     Radiology Studies:  ECHOCARDIOGRAM COMPLETE  Result Date: 05/23/2021    ECHOCARDIOGRAM REPORT   Patient Name:   Shane [name] Date of Exam: 05/23/2021 Medical Rec #:  [id number]          Height:       74.0 in Accession #:    [id number]         Weight:       248.1 lb Date of Birth:  [birth year]          BSA:          2.382 m Patient Age:    89 years           BP:           108/74 mmHg Patient Gender: M  HR:           72 bpm. Exam Location:  Inpatient Procedure: 2D Echo, Cardiac Doppler, Color Doppler and Intracardiac            Opacification Agent Indications:    Pulmonary Embolus I26.09  History:        Patient has prior history of Echocardiogram examinations, most                 recent 10/27/2020. Risk Factors:Sleep Apnea and Current Smoker.                 Bilarteral DVTS. COVID infection. Shortness of breath. Left                 sided chest pain. GERD.  Sonographer:    Darlina Sicilian RDCS Referring Phys: [phone] Uzma Hellmer D Jonquil Stubbe  Sonographer Comments: Image acquisition challenging due to respiratory motion and Restricted mobility due to chest pain. IMPRESSIONS  1. Techincally difficult study with limited views. Left ventricular ejection fraction, by estimation, is 70 to 75%. The left ventricle has hyperdynamic function. The left ventricle has no regional wall motion abnormalities. There is mild left ventricular hypertrophy. Left ventricular diastolic parameters were normal.  2. Right ventricule is poorly visualized but grossly normal size and systolic function  3. The mitral valve is normal in structure. No evidence of mitral valve regurgitation.  4. The aortic valve is tricuspid. Aortic valve regurgitation is not visualized. No aortic stenosis is present. FINDINGS  Left  Ventricle: Left ventricular ejection fraction, by estimation, is 70 to 75%. The left ventricle has hyperdynamic function. The left ventricle has no regional wall motion abnormalities. Definity contrast agent was given IV to delineate the left ventricular endocardial borders. The left ventricular internal cavity size was normal in size. There is mild left ventricular hypertrophy. Left ventricular diastolic parameters were normal. Right Ventricle: The right ventricular size is not well visualized. Right vetricular wall thickness was not well visualized. Right ventricular systolic function was not well visualized. Left Atrium: Left atrial size was normal in size. Right Atrium: Right atrial size was not well visualized. Pericardium: There is no evidence of pericardial effusion. Mitral Valve: The mitral valve is normal in structure. No evidence of mitral valve regurgitation. Tricuspid Valve: The tricuspid valve is normal in structure. Tricuspid valve regurgitation is trivial. Aortic Valve: The aortic valve is tricuspid. Aortic valve regurgitation is not visualized. No aortic stenosis is present. Pulmonic Valve: The pulmonic valve was not well visualized. Pulmonic valve regurgitation is trivial. Aorta: The aortic root is normal in size and structure. IAS/Shunts: The interatrial septum was not well visualized.  LEFT VENTRICLE PLAX 2D LVIDd:         4.00 cm   Diastology LVIDs:         2.80 cm   LV e' medial:    9.32 cm/s LV PW:         1.00 cm   LV E/e' medial:  7.9 LV IVS:        1.20 cm   LV e' lateral:   13.70 cm/s LVOT diam:     2.20 cm   LV E/e' lateral: 5.4 LVOT Area:     3.80 cm  RIGHT VENTRICLE RV S prime:     20.70 cm/s TAPSE (M-mode): 2.1 cm LEFT ATRIUM             Index LA Vol (A2C):   49.4 ml 20.74 ml/m LA Vol (A4C):   25.6 ml 10.75  ml/m LA Biplane Vol: 36.9 ml 15.49 ml/m   AORTA Ao Root diam: 3.60 cm MITRAL VALVE MV Area (PHT): 2.94 cm     SHUNTS MV Decel Time: 258 msec     Systemic Diam: 2.20 cm MV E  velocity: 73.30 cm/s MV A velocity: 110.00 cm/s MV E/A ratio:  0.67 Oswaldo Milian MD Electronically signed by Oswaldo Milian MD Signature Date/Time: 05/23/2021/4:18:40 PM    Final    VAS [place] LOWER EXTREMITY VENOUS (DVT)  Result Date: 05/24/2021  Lower Venous DVT Study Patient Name:  Shane [name]  Date of Exam:   05/23/2021 Medical Rec #: [id number]           Accession #:    [id number] Date of Birth: 06/02/1946           Patient Gender: M Patient Age:   28 years Exam Location:  [facility] Procedure:      VAS [place] LOWER EXTREMITY VENOUS (DVT) Referring Phys: Rithy Mandley --------------------------------------------------------------------------------  Indications: Pulmonary embolism.  Anticoagulation: Heparin. Comparison Study: 10-30-2020 Most recent prior lower extremity venous was                   positive for chronic DVT bilaterally. Performing Technologist: Rogelia Rohrer RVT, RDMS  Examination Guidelines: A complete evaluation includes B-mode imaging, spectral Doppler, color Doppler, and power Doppler as needed of all accessible portions of each vessel. Bilateral testing is considered an integral part of a complete examination. Limited examinations for reoccurring indications may be performed as noted. The reflux portion of the exam is performed with the patient in reverse Trendelenburg.  +---------+---------------+---------+-----------+----------+-------------------+ RIGHT    CompressibilityPhasicitySpontaneityPropertiesThrombus Aging      +---------+---------------+---------+-----------+----------+-------------------+ CFV      Full           Yes      Yes                                      +---------+---------------+---------+-----------+----------+-------------------+ SFJ      Full                                                             +---------+---------------+---------+-----------+----------+-------------------+ FV Prox  Partial        Yes      Yes                   Chronic             +---------+---------------+---------+-----------+----------+-------------------+ FV Mid   Partial        Yes      Yes                  Chronic             +---------+---------------+---------+-----------+----------+-------------------+ FV DistalPartial        Yes      Yes                  Chronic             +---------+---------------+---------+-----------+----------+-------------------+ PFV      Full                                                             +---------+---------------+---------+-----------+----------+-------------------+  POP      Partial        Yes      Yes                  Chronic             +---------+---------------+---------+-----------+----------+-------------------+ PTV      Full                                         Not well visualized +---------+---------------+---------+-----------+----------+-------------------+ PERO                                                  Not well visualized +---------+---------------+---------+-----------+----------+-------------------+   +---------+---------------+---------+-----------+----------+-------------------+ LEFT     CompressibilityPhasicitySpontaneityPropertiesThrombus Aging      +---------+---------------+---------+-----------+----------+-------------------+ CFV      Full           Yes      Yes                                      +---------+---------------+---------+-----------+----------+-------------------+ SFJ      Full                                                             +---------+---------------+---------+-----------+----------+-------------------+ FV Prox  Partial        Yes      Yes                  Chronic             +---------+---------------+---------+-----------+----------+-------------------+ FV Mid   Partial        Yes      Yes                  Chronic              +---------+---------------+---------+-----------+----------+-------------------+ FV DistalNone           No       No                   Chronic             +---------+---------------+---------+-----------+----------+-------------------+ PFV      Full                                                             +---------+---------------+---------+-----------+----------+-------------------+ POP      None           No       No                   Chronic             +---------+---------------+---------+-----------+----------+-------------------+ PTV      None           No  No                   Chronic             +---------+---------------+---------+-----------+----------+-------------------+ PERO                                                  Not well visualized +---------+---------------+---------+-----------+----------+-------------------+     Summary: RIGHT: - Findings consistent with 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 - Findings appear essentially unchanged compared to previous examination. - No cystic structure found in the popliteal fossa.  LEFT: - Findings consistent with chronic deep vein thrombosis involving the left femoral vein, left popliteal vein, and left posterior tibial veins. - Findings appear essentially unchanged compared to previous examination. - No cystic structure found in the popliteal fossa.  *See table(s) above for measurements and observations. Electronically signed by Harold Barban MD on 05/24/2021 at 12:53:51 AM.    Final     Scheduled Meds:    vitamin C  500 mg Oral Daily   aspirin  325 mg Oral Daily   metoprolol tartrate  25 mg Oral BID   mometasone-formoterol  2 puff Inhalation BID   pantoprazole  40 mg Oral BID   rosuvastatin  10 mg Oral Daily   sodium chloride flush  3 mL Intravenous Q12H   zinc sulfate  220 mg Oral Daily    Continuous Infusions:     heparin 1,450 Units/hr (05/24/21 1515)     LOS: 1 day     Vernell Leep, MD,  FACP, [facility], [facility], [facility] (Care Management Physician Certified) [place]  To contact the attending provider between 7A-7P or the covering provider during after hours 7P-7A, please log into the web site [email or web address] and access using universal [place] password for that web site. If you do not have the password, please call the hospital operator.  05/24/2021, 3:23 PM

## 2021-05-25 ENCOUNTER — Inpatient Hospital Stay (HOSPITAL_COMMUNITY): Payer: PPO

## 2021-05-25 LAB — CBC
HCT: 32.7 % — ABNORMAL LOW (ref 39.0–52.0)
Hemoglobin: 10.3 g/dL — ABNORMAL LOW (ref 13.0–17.0)
MCH: 19.9 pg — ABNORMAL LOW (ref 26.0–34.0)
MCHC: 31.5 g/dL (ref 30.0–36.0)
MCV: 63.2 fL — ABNORMAL LOW (ref 80.0–100.0)
Platelets: 197 10*3/uL (ref 150–400)
RBC: 5.17 MIL/uL (ref 4.22–5.81)
RDW: 15.9 % — ABNORMAL HIGH (ref 11.5–15.5)
WBC: 6.3 10*3/uL (ref 4.0–10.5)
nRBC: 0.3 % — ABNORMAL HIGH (ref 0.0–0.2)

## 2021-05-25 LAB — LIPID PANEL
Cholesterol: 85 mg/dL (ref 0–200)
HDL: 32 mg/dL — ABNORMAL LOW (ref >40)
LDL Cholesterol: 45 mg/dL (ref 0–99)
Total CHOL/HDL Ratio: 2.7 RATIO
Triglycerides: 41 mg/dL (ref <150)
VLDL: 8 mg/dL (ref 0–40)

## 2021-05-25 LAB — BASIC METABOLIC PANEL
Anion gap: 6 (ref 5–15)
BUN: 19 mg/dL (ref 8–23)
CO2: 26 mmol/L (ref 22–32)
Calcium: 8.5 mg/dL — ABNORMAL LOW (ref 8.9–10.3)
Chloride: 106 mmol/L (ref 98–111)
Creatinine, Ser: 1.66 mg/dL — ABNORMAL HIGH (ref 0.61–1.24)
GFR, Estimated: 42 mL/min — ABNORMAL LOW (ref >60)
Glucose, Bld: 95 mg/dL (ref 70–99)
Potassium: 3.8 mmol/L (ref 3.5–5.1)
Sodium: 138 mmol/L (ref 135–145)

## 2021-05-25 LAB — HEMOGLOBIN A1C
Hgb A1c MFr Bld: 5.4 % (ref 4.8–5.6)
Mean Plasma Glucose: 108.28 mg/dL

## 2021-05-25 LAB — HEPARIN LEVEL (UNFRACTIONATED): Heparin Unfractionated: 0.7 IU/mL (ref 0.30–0.70)

## 2021-05-25 MED ORDER — ASPIRIN EC 81 MG PO TBEC
81.0000 mg | DELAYED_RELEASE_TABLET | Freq: Every day | ORAL | Status: DC
  Administered 2021-05-26 – 2021-05-29 (×4): 81 mg via ORAL
  Filled 2021-05-25 (×4): qty 1

## 2021-05-25 NOTE — Progress Notes (Signed)
ANTICOAGULATION CONSULT NOTE - Follow Up Consult  Pharmacy Consult for Heparin Indication: pulmonary embolus  No Known Allergies  Patient Measurements: Height: 6\' 2"  (188 cm) Weight: 112.2 kg (247 lb 5.7 oz) IBW/kg (Calculated) : 82.2 Heparin Dosing Weight: 106 kg  Vital Signs: Temp: 98.3 F (36.8 C) (11/25 0400) Temp Source: Oral (11/25 0400) BP: 120/64 (11/25 0400) Pulse Rate: 60 (11/25 0400)  Labs: Recent Labs    05/23/21 0735 05/23/21 1609 05/24/21 0150 05/25/21 0229  HGB 11.5*  --  10.9* 10.3*  HCT 36.0*  --  34.6* 32.7*  PLT 207  --  193 197  HEPARINUNFRC 0.61 0.61 0.61 0.70  CREATININE 1.44*  --  1.63* 1.66*     Estimated Creatinine Clearance: 50.4 mL/min (A) (by C-G formula based on SCr of 1.66 mg/dL (H)).   Medications:  Scheduled:   vitamin C  500 mg Oral Daily   aspirin  325 mg Oral Daily   metoprolol tartrate  12.5 mg Oral BID   mometasone-formoterol  2 puff Inhalation BID   pantoprazole  40 mg Oral BID   rosuvastatin  10 mg Oral Daily   sodium chloride flush  3 mL Intravenous Q12H   zinc sulfate  220 mg Oral Daily   Infusions:   heparin 1,450 Units/hr (05/25/21 0600)    Assessment: 75 yo M with h/o DVT on Eliquis admitted with new PE. Pharmacy consulted to dose heparin.   Heparin level this morning therapeutic at 0.7 (at upper end), on 1450 units/hr. CBC stable. No line issues or signs/symptoms of bleeding noted per RN.   Goal of Therapy:  Heparin level 0.3-0.7 units/ml Monitor platelets by anticoagulation protocol: Yes   Plan:  Reduce IV heparin to 1400 units/hr. Daily CBC and heparin level Monitor for signs/symptoms of bleeding Follow up transition to oral anticoagulant  [place] Crane, Vena Austria, BCPS, [facility] Clinical Pharmacist  05/25/2021 11:34 AM   [facility] pharmacy phone numbers are listed on [email or web address]

## 2021-05-25 NOTE — Progress Notes (Addendum)
PROGRESS NOTE   ZELL HYLTON  TIW:[id number]    DOB: June 06, 1946    DOA: 05/21/2021  PCP: Pcp, No   I have briefly reviewed patients previous medical records in [facility].  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  Recently tested positive for COVID-19 on 11/15 at the [facility] hospital but not treated with meds.  Patient reportedly left [facility] on 9/27 and family trying to get him into an assisted living facility, currently lives alone at home with friend/family intermittently checking on him.  It appears that patient may not have been fully compliant with his Eliquis.  CTA chest showed small segmental pulmonary artery emboli in the lower lobes bilaterally, right heart strain could not be excluded.  Started on IV heparin.  Hematology was consulted and recommended IV heparin x5 days.   Assessment & Plan:  Principal Problem:   Acute pulmonary embolism (HCC) Active Problems:   Microcytic anemia   GERD   PTSD (post-traumatic stress disorder)   Sleep apnea   Essential hypertension   History of DVT (deep vein thrombosis)   Chronic anticoagulation   History of GI bleed   COVID-19 virus infection   Acute pulmonary embolism/bilateral lower extremity chronic DVTs - CTA chest showed small segmental pulmonary artery emboli in the lower lobes bilaterally and right heart strain could not be excluded -2D echo 11/23: LVEF 70-75%, LV hyperdynamic function, no regional wall motion abnormalities, mild LVH.  RV poorly visualized but grossly normal size and systolic function. -Lower extremity venous Dopplers confirm bilateral lower extremity chronic DVTs. -It appears that patient was on Eliquis  anticoagulation for history of bilateral DVT initially diagnosed in June 2020 but unclear why he still remains on it this far out. -Hematology consultation appreciated.  Recommend indefinite anticoagulation therapy.  Also confirmed with Dr. Benay Spice and he recommends IV heparin anticoagulation x5 days prior to converting to apixaban or Xarelto.  IV heparin initiated on 11/22 and hence day 4 today. -Recent COVID-19 infection and noncompliance with Eliquis may have contributed. -Patient will need to follow-up with his PCP regarding need for continuation of aspirin while on anticoagulation with DOAC's. -Follow-up hemoglobin electrophoresis and soluble transferrin receptor results. -Hemodynamically stable and not hypoxic  Recent COVID-19 infection -Reportedly diagnosed on 05/15/2021 at the [facility]. -No symptoms pertaining to this currently.  Not hypoxic. -No significant elevation of inflammatory markers. -Remains on negative pressure isolation.  Hopefully should come off of isolation on 11/25.  Asthma -Stable without exacerbation.  Continue Advair substituted medicine.  Microcytic hypochromic anemia -Outpatient follow-up and evaluation as deemed necessary. -Stable.  Chronic diastolic CHF -Clinically euvolemic.  Essential hypertension -Continue metoprolol.  Acute kidney injury  -Creat has bumped up from 1.19-1.44-1.63.  Creatinine has plateaued compared to yesterday. -Discontinued Lasix.  Given soft blood pressures, reduced metoprolol dose from 25-12.5 Mg twice daily. Did get contrast for CTA chest which may be the likely culprit. -Avoid nephrotoxic's. -Brief gentle IV fluids yesterday.  Appears clinically euvolemic.  Avoid nephrotoxins.  Follow BMP in a.m. and hopefully creatinine starts to improve.  History of GI bleed secondary to gastric ulcers -Underwent EGD by Dr. Bryan Lemma 5/3 which revealed oozing gastric ulcers treated with clips, nonobstructive Schatzki's ring, gastritis,  duodenitis and single duodenal polyp that was resected and clips placed. -Continue Protonix -Continue anticoagulation with close monitoring.  Hopefully he does not have an anticoagulation limiting GI bleed. - As discussed with pharmacy, no clear indication for him being on aspirin 325 Mg daily and hence reduced to 81 Mg daily to minimize risk of GI bleed.  Hyperlipidemia -Continue statins  Anxiety/depression/PTSD -Reportedly recently started on mood medications last week  Incidental findings noted on CTA chest that will need to be followed closely as outpatient: CT noted ascending aorta measuring up to 4 cm, right middle lobe pulmonary nodule unchanged from prior, small hiatal hernia, concern for pulmonary artery hypertension, and cardiomegaly with coronary artery calcifications. -Recommend screening for AAA  Cognitive impairment - Baseline mental status is not known.  Could have this chronically.?  Dementia.  Body mass index is 31.76 kg/m./Obesity    DVT prophylaxis:   Currently on IV heparin   Code Status: DNR Family Communication: None at bedside.  Unable to reach patient's brother via phone 11/24, left VM message. Disposition:  Status is: Inpatient.  The patient will require care spanning > 2 midnights and should be moved to inpatient because: Will remain on IV heparin for a total of 5 days as per hematology recommendation       Consultants:   Hematology  Procedures:   None  Antimicrobials:      Subjective:  Chronically poor historian.  Denies complaints.  Denies dyspnea.  As per RN, patient with inappropriate behavior with male nursing yesterday and last night.  Objective:   Vitals:   05/24/21 2000 05/25/21 0400 05/25/21 0500 05/25/21 1555  BP: 120/73 120/64  118/78  Pulse: 68 60  (!) 56  Resp: 17 12  13   Temp: 98.4 F (36.9 C) 98.3 F (36.8 C)  98.4 F (36.9 C)  TempSrc: Oral Oral  Oral  SpO2: 97% 100%  95%  Weight:   112.2 kg   Height:         General exam: Elderly male, moderately built and nourished lying comfortably propped up in bed without distress.  Oral mucosa with moist. Respiratory system: Clear to auscultation.  No increased work of breathing. Cardiovascular system: S1 and S2 heard, RRR.  No JVD or murmurs.  No pedal edema.  Telemetry personally reviewed: Sinus bradycardia in the 50s Gastrointestinal system: Abdomen is nondistended, soft and nontender. No organomegaly or masses felt. Normal bowel sounds heard. Central nervous system: Alert and oriented x2. No focal neurological deficits. Extremities: Symmetric 5 x 5 power. Skin: No rashes, lesions or ulcers Psychiatry: Judgement and insight appear impaired. Mood & affect appropriate.     Data Reviewed:   I have personally reviewed following labs and imaging studies   CBC: Recent Labs  Lab 05/21/21 2034 05/22/21 0916 05/23/21 0735 05/24/21 0150 05/25/21 0229  WBC 6.2   < > 6.4 8.1 6.3  NEUTROABS 3.9  --   --   --   --   HGB 12.1*   < > 11.5* 10.9* 10.3*  HCT 39.0   < > 36.0* 34.6* 32.7*  MCV 64.0*   < > 63.3* 62.6* 63.2*  PLT 207   < > 207 193 197   < > = values in this interval not displayed.    Basic Metabolic Panel: Recent Labs  Lab 05/21/21 2034 05/22/21 0916 05/23/21 0735 05/24/21 0150 05/25/21 0229  NA 138  --  135 136 138  K 3.9  --  4.0 3.8 3.8  CL  101  --  101 103 106  CO2 26  --  24 25 26   GLUCOSE 109*  --  86 95 95  BUN 7*  --  11 19 19   CREATININE 1.19  --  1.44* 1.63* 1.66*  CALCIUM 9.5  --  8.9 8.5* 8.5*  MG  --  1.8  --   --   --   PHOS  --  2.8  --   --   --     Liver Function Tests: Recent Labs  Lab 05/21/21 2034  AST 18  ALT 11  ALKPHOS 80  BILITOT 0.8  PROT 7.4  ALBUMIN 3.9    CBG: No results for input(s): GLUCAP in the last 168 hours.  Microbiology Studies:   Recent Results (from the past 240 hour(s))  Resp Panel by RT-PCR (Flu A&B, Covid) Urine, Clean Catch     Status: Abnormal   Collection Time:  05/21/21  8:18 PM   Specimen: Urine, Clean Catch; Nasopharyngeal(NP) swabs in vial transport medium  Result Value Ref Range Status   SARS Coronavirus 2 by RT PCR POSITIVE (A) NEGATIVE Final    Comment: RESULT CALLED TO, READ BACK BY AND VERIFIED WITH: RN BOBBY S. 05/21/21@22 :16 BY TW (NOTE) SARS-CoV-2 target nucleic acids are DETECTED.  The SARS-CoV-2 RNA is generally detectable in upper respiratory specimens during the acute phase of infection. Positive results are indicative of the presence of the identified virus, but do not rule out bacterial infection or co-infection with other pathogens not detected by the test. Clinical correlation with patient history and other diagnostic information is necessary to determine patient infection status. The expected result is Negative.  Fact Sheet for Patients: EntrepreneurPulse.com.au  Fact Sheet for Healthcare Providers: IncredibleEmployment.be  This test is not yet approved or cleared by the [place] FDA and  has been authorized for detection and/or diagnosis of SARS-CoV-2 by FDA under an Emergency Use Authorization (EUA).  This EUA will remain in effect (meaning this test can be  used) for the duration of  the COVID-19 declaration under Section 564(b)(1) of the Act, 21 U.S.C. section 360bbb-3(b)(1), unless the authorization is terminated or revoked sooner.     Influenza A by PCR NEGATIVE NEGATIVE Final   Influenza B by PCR NEGATIVE NEGATIVE Final    Comment: (NOTE) The Xpert Xpress SARS-CoV-2/FLU/RSV plus assay is intended as an aid in the diagnosis of influenza from Nasopharyngeal swab specimens and should not be used as a sole basis for treatment. Nasal washings and aspirates are unacceptable for Xpert Xpress SARS-CoV-2/FLU/RSV testing.  Fact Sheet for Patients: EntrepreneurPulse.com.au  Fact Sheet for Healthcare  Providers: IncredibleEmployment.be  This test is not yet approved or cleared by the [place] FDA and has been authorized for detection and/or diagnosis of SARS-CoV-2 by FDA under an Emergency Use Authorization (EUA). This EUA will remain in effect (meaning this test can be used) for the duration of the COVID-19 declaration under Section 564(b)(1) of the Act, 21 U.S.C. section 360bbb-3(b)(1), unless the authorization is terminated or revoked.  Performed at [facility], [place] [address], [place], [place] [address]     Radiology Studies:  VAS [place] LOWER EXTREMITY VENOUS (DVT)  Result Date: 05/24/2021  Lower Venous DVT Study Patient Name:  JEMARIO POITRAS  Date of Exam:   05/23/2021 Medical Rec #: [id number]           Accession #:    [id number] Date of Birth: 03/03/1946  Patient Gender: M Patient Age:   60 years Exam Location:  [facility] Procedure:      VAS [place] LOWER EXTREMITY VENOUS (DVT) Referring Phys: Kourtlyn Charlet --------------------------------------------------------------------------------  Indications: Pulmonary embolism.  Anticoagulation: Heparin. Comparison Study: 10-30-2020 Most recent prior lower extremity venous was                   positive for chronic DVT bilaterally. Performing Technologist: Rogelia Rohrer RVT, RDMS  Examination Guidelines: A complete evaluation includes B-mode imaging, spectral Doppler, color Doppler, and power Doppler as needed of all accessible portions of each vessel. Bilateral testing is considered an integral part of a complete examination. Limited examinations for reoccurring indications may be performed as noted. The reflux portion of the exam is performed with the patient in reverse Trendelenburg.  +---------+---------------+---------+-----------+----------+-------------------+ RIGHT    CompressibilityPhasicitySpontaneityPropertiesThrombus Aging       +---------+---------------+---------+-----------+----------+-------------------+ CFV      Full           Yes      Yes                                      +---------+---------------+---------+-----------+----------+-------------------+ SFJ      Full                                                             +---------+---------------+---------+-----------+----------+-------------------+ FV Prox  Partial        Yes      Yes                  Chronic             +---------+---------------+---------+-----------+----------+-------------------+ FV Mid   Partial        Yes      Yes                  Chronic             +---------+---------------+---------+-----------+----------+-------------------+ FV DistalPartial        Yes      Yes                  Chronic             +---------+---------------+---------+-----------+----------+-------------------+ PFV      Full                                                             +---------+---------------+---------+-----------+----------+-------------------+ POP      Partial        Yes      Yes                  Chronic             +---------+---------------+---------+-----------+----------+-------------------+ PTV      Full                                         Not well visualized +---------+---------------+---------+-----------+----------+-------------------+  PERO                                                  Not well visualized +---------+---------------+---------+-----------+----------+-------------------+   +---------+---------------+---------+-----------+----------+-------------------+ LEFT     CompressibilityPhasicitySpontaneityPropertiesThrombus Aging      +---------+---------------+---------+-----------+----------+-------------------+ CFV      Full           Yes      Yes                                      +---------+---------------+---------+-----------+----------+-------------------+  SFJ      Full                                                             +---------+---------------+---------+-----------+----------+-------------------+ FV Prox  Partial        Yes      Yes                  Chronic             +---------+---------------+---------+-----------+----------+-------------------+ FV Mid   Partial        Yes      Yes                  Chronic             +---------+---------------+---------+-----------+----------+-------------------+ FV DistalNone           No       No                   Chronic             +---------+---------------+---------+-----------+----------+-------------------+ PFV      Full                                                             +---------+---------------+---------+-----------+----------+-------------------+ POP      None           No       No                   Chronic             +---------+---------------+---------+-----------+----------+-------------------+ PTV      None           No       No                   Chronic             +---------+---------------+---------+-----------+----------+-------------------+ PERO                                                  Not well visualized +---------+---------------+---------+-----------+----------+-------------------+     Summary: RIGHT: - Findings consistent with chronic deep  vein thrombosis involving the right femoral vein, right popliteal vein, and right posterior tibial veins. - Findings appear essentially unchanged compared to previous examination. - No cystic structure found in the popliteal fossa.  LEFT: - Findings consistent with chronic deep vein thrombosis involving the left femoral vein, left popliteal vein, and left posterior tibial veins. - Findings appear essentially unchanged compared to previous examination. - No cystic structure found in the popliteal fossa.  *See table(s) above for measurements and observations. Electronically signed by  Harold Barban MD on 05/24/2021 at 12:53:51 AM.    Final     Scheduled Meds:    vitamin C  500 mg Oral Daily   [START ON 05/26/2021] aspirin EC  81 mg Oral Daily   metoprolol tartrate  12.5 mg Oral BID   mometasone-formoterol  2 puff Inhalation BID   pantoprazole  40 mg Oral BID   rosuvastatin  10 mg Oral Daily   sodium chloride flush  3 mL Intravenous Q12H   zinc sulfate  220 mg Oral Daily    Continuous Infusions:    heparin 1,400 Units/hr (05/25/21 1150)     LOS: 2 days     Vernell Leep, MD,  FACP, [facility], [facility], [facility] (Care Management Physician Certified) [place]  To contact the attending provider between 7A-7P or the covering provider during after hours 7P-7A, please log into the web site [email or web address] and access using universal [place] password for that web site. If you do not have the password, please call the hospital operator.  05/25/2021, 5:08 PM

## 2021-05-26 LAB — CBC
HCT: 34 % — ABNORMAL LOW (ref 39.0–52.0)
Hemoglobin: 10.4 g/dL — ABNORMAL LOW (ref 13.0–17.0)
MCH: 19.3 pg — ABNORMAL LOW (ref 26.0–34.0)
MCHC: 30.6 g/dL (ref 30.0–36.0)
MCV: 63.2 fL — ABNORMAL LOW (ref 80.0–100.0)
Platelets: 195 10*3/uL (ref 150–400)
RBC: 5.38 MIL/uL (ref 4.22–5.81)
RDW: 16.2 % — ABNORMAL HIGH (ref 11.5–15.5)
WBC: 6.4 10*3/uL (ref 4.0–10.5)
nRBC: 0 % (ref 0.0–0.2)

## 2021-05-26 LAB — BASIC METABOLIC PANEL
Anion gap: 6 (ref 5–15)
BUN: 13 mg/dL (ref 8–23)
CO2: 25 mmol/L (ref 22–32)
Calcium: 8.7 mg/dL — ABNORMAL LOW (ref 8.9–10.3)
Chloride: 106 mmol/L (ref 98–111)
Creatinine, Ser: 1.42 mg/dL — ABNORMAL HIGH (ref 0.61–1.24)
GFR, Estimated: 51 mL/min — ABNORMAL LOW (ref >60)
Glucose, Bld: 111 mg/dL — ABNORMAL HIGH (ref 70–99)
Potassium: 3.7 mmol/L (ref 3.5–5.1)
Sodium: 137 mmol/L (ref 135–145)

## 2021-05-26 LAB — TSH: TSH: 5.181 u[IU]/mL — ABNORMAL HIGH (ref 0.350–4.500)

## 2021-05-26 LAB — HEPARIN LEVEL (UNFRACTIONATED): Heparin Unfractionated: 0.63 IU/mL (ref 0.30–0.70)

## 2021-05-26 MED ORDER — APIXABAN 5 MG PO TABS
5.0000 mg | ORAL_TABLET | Freq: Two times a day (BID) | ORAL | Status: DC
  Administered 2021-05-27 – 2021-05-29 (×6): 5 mg via ORAL
  Filled 2021-05-26 (×6): qty 1

## 2021-05-26 NOTE — Progress Notes (Addendum)
PROGRESS NOTE   Shane [name]  EHM:[id number]    DOB: 1945/08/23    DOA: 05/21/2021  PCP: Sheela Stack, MD @ the [facility]   I have briefly reviewed patients previous medical records in [facility].  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  Recently tested positive for COVID-19 on 11/15 at the [facility] but not treated with meds.  Patient reportedly left [facility] on 9/27 and family trying to get him into an assisted living facility, currently lives alone at home with friend/family intermittently checking on him.  It appears that patient may not have been fully compliant with his Eliquis.  CTA chest showed small segmental pulmonary artery emboli in the lower lobes bilaterally, right heart strain could not be excluded.  Started on IV heparin.  Hematology was consulted and recommended IV heparin x5 days.  I think he is finishing his 5 days of IV heparin today.  Transition to Eliquis after that.  Since patient missed at least 3 days of Eliquis prior to admission that we know off, this would not be considered as Eliquis failure.   Assessment & Plan:  Principal Problem:   Acute pulmonary embolism (HCC) Active Problems:   Microcytic anemia   GERD   PTSD (post-traumatic stress disorder)   Sleep apnea   Essential hypertension   History of DVT (deep vein thrombosis)   Chronic anticoagulation   History of GI bleed   COVID-19 virus infection   Acute pulmonary embolism/bilateral lower extremity chronic DVTs - CTA chest showed small segmental pulmonary artery emboli in the lower lobes bilaterally and right heart strain could not be excluded -2D echo 11/23: LVEF 70-75%, LV  hyperdynamic function, no regional wall motion abnormalities, mild LVH.  RV poorly visualized but grossly normal size and systolic function. -Lower extremity venous Dopplers confirm bilateral lower extremity chronic DVTs. -It appears that patient was on Eliquis anticoagulation for history of bilateral DVT initially diagnosed in June 2020 but unclear why he still remains on it this far out. -Hematology consultation appreciated.  Recommend indefinite anticoagulation therapy.  Also confirmed with Dr. Benay Spice and he recommends IV heparin anticoagulation x5 days prior to converting to apixaban or Xarelto.  IV heparin initiated on 11/22 and hence day 5 today. -  Transition to Eliquis after completing IV heparin.  Since patient missed at least 3 days of Eliquis prior to admission that we know off, this would not be considered as Eliquis failure. -Recent COVID-19 infection and noncompliance with Eliquis may have contributed. -Patient will need to follow-up with his PCP regarding need for continuation of aspirin while on anticoagulation with DOAC's and also to definitely address the duration of anticoagulation. -Follow-up hemoglobin electrophoresis and soluble transferrin receptor results which are still pending. -Hemodynamically stable and not hypoxic  Recent COVID-19 infection -Reportedly diagnosed on 05/15/2021 at the [facility] -No symptoms pertaining to this currently.  Not hypoxic. -No significant elevation of inflammatory markers. -Has completed 10 days of negative pressure isolation.  Discontinued airborne isolation.  This was discussed by patient's RN with infection prevention and they agree.  Asthma -Stable without exacerbation.  Continue Advair substituted medicine.  Microcytic hypochromic anemia -Outpatient follow-up and evaluation as deemed necessary. -Stable.  Chronic diastolic CHF -Clinically euvolemic.  Essential hypertension - Significant SB into the 40's and very occ in 30's.  DC'ed Metoprolol. Check TSH.  Acute kidney injury  -Creat has bumped up from 1.19-1.44-1.63.  Creatinine has plateaued compared to yesterday. -Discontinued Lasix.  Given soft blood pressures, reduced metoprolol dose from 25-12.5 Mg twice daily. Did get contrast for CTA chest which may be the likely culprit i.e. contrast-induced nephropathy. -Avoid nephrotoxic's. -Brief IV fluids.  Clinically euvolemic.  Creatinine starting to trend down/1.42  History of GI bleed secondary to gastric ulcers -Underwent EGD by Dr. Bryan Lemma 5/3 which revealed oozing gastric ulcers treated with clips, nonobstructive Schatzki's ring, gastritis, duodenitis and single duodenal polyp that was resected and clips placed. -Continue Protonix -Continue anticoagulation with close monitoring.  Hopefully he does not have an anticoagulation limiting GI bleed. - As discussed with pharmacy, no clear indication for him being on aspirin 325 Mg daily and hence reduced to 81 Mg daily to minimize risk of GI bleed.  Hyperlipidemia -Continue statins  Anxiety/depression/PTSD -Reportedly recently started on mood medications last week  Incidental findings noted on CTA chest that will need to be followed closely as outpatient: CT noted ascending aorta measuring up to 4 cm, right middle lobe pulmonary nodule unchanged from prior, small hiatal hernia, concern for pulmonary artery hypertension, and cardiomegaly with coronary artery calcifications. -Recommend screening for AAA  ? Cognitive impairment - As per patient's brother  Body mass index is 33.77 kg/m./Obesity    DVT prophylaxis:   Currently on IV heparin   Code Status: DNR Family Communication: I discussed with patient's brother on 11/26 and updated care. Disposition:  Status is: Inpatient.  Once patient switches to Eliquis, he should be stable for DC to SNF when bed available.  Contacted TOC.       Consultants:   Hematology  Procedures:    None  Antimicrobials:      Subjective:  Chronic poor historian.  No specific complaints.  When asked, says he has occasional dyspnea.  No pain reported.  Reported that he lives alone at home and had a walker and a wheelchair.  Reports generalized weakness.  Objective:   Vitals:   05/26/21 0401 05/26/21 0732 05/26/21 1146 05/26/21 1620  BP: 136/85 118/71 138/76 139/88  Pulse: (!) 55 (!) 50 (!) 57 (!) 50  Resp: 19 14 14 15   Temp: 98.1 F (36.7 C) 98.1 F (36.7 C) 98.1 F (36.7 C) 98.1 F (36.7 C)  TempSrc: Oral Oral Oral Oral  SpO2: 95% 98% 95% 99%  Weight: 119.3 kg     Height:        General exam: Elderly male, moderately built and nourished lying comfortably propped up in bed without distress.  Oral mucosa moist. Respiratory system: Clear to auscultation.  No increased work of breathing. Cardiovascular system: S1 and S2 heard, RRR.  No JVD or murmurs.  No pedal edema.  Telemetry personally reviewed: Sinus bradycardia in the 50s Gastrointestinal system: Abdomen is nondistended, soft and nontender. No organomegaly or masses felt. Normal bowel sounds heard. Central nervous system: Alert and oriented x2. No focal neurological deficits. Extremities: Symmetric 5 x 5 power. Skin: No rashes, lesions or ulcers Psychiatry: Judgement and insight appear impaired. Mood & affect appropriate.     Data Reviewed:   I have personally reviewed following labs and imaging studies   CBC: Recent Labs  Lab 05/21/21 2034 05/22/21 0916 05/24/21 0150 05/25/21 0229 05/26/21 0238  WBC 6.2   < > 8.1 6.3  6.4  NEUTROABS 3.9  --   --   --   --   HGB 12.1*   < > 10.9* 10.3* 10.4*  HCT 39.0   < > 34.6* 32.7* 34.0*  MCV 64.0*   < > 62.6* 63.2* 63.2*  PLT 207   < > 193 197 195   < > = values in this interval not displayed.    Basic Metabolic Panel: Recent Labs  Lab 05/21/21 2034 05/22/21 0916 05/23/21 0735 05/24/21 0150 05/25/21 0229 05/26/21 0238  NA 138  --  135 136 138 137  K 3.9   --  4.0 3.8 3.8 3.7  CL 101  --  101 103 106 106  CO2 26  --  24 25 26 25   GLUCOSE 109*  --  86 95 95 111*  BUN 7*  --  11 19 19 13   CREATININE 1.19  --  1.44* 1.63* 1.66* 1.42*  CALCIUM 9.5  --  8.9 8.5* 8.5* 8.7*  MG  --  1.8  --   --   --   --   PHOS  --  2.8  --   --   --   --     Liver Function Tests: Recent Labs  Lab 05/21/21 2034  AST 18  ALT 11  ALKPHOS 80  BILITOT 0.8  PROT 7.4  ALBUMIN 3.9    CBG: No results for input(s): GLUCAP in the last 168 hours.  Microbiology Studies:   Recent Results (from the past 240 hour(s))  Resp Panel by RT-PCR (Flu A&B, Covid) Urine, Clean Catch     Status: Abnormal   Collection Time: 05/21/21  8:18 PM   Specimen: Urine, Clean Catch; Nasopharyngeal(NP) swabs in vial transport medium  Result Value Ref Range Status   SARS Coronavirus 2 by RT PCR POSITIVE (A) NEGATIVE Final    Comment: RESULT CALLED TO, READ BACK BY AND VERIFIED WITH: RN BOBBY S. 05/21/21@22 :16 BY TW (NOTE) SARS-CoV-2 target nucleic acids are DETECTED.  The SARS-CoV-2 RNA is generally detectable in upper respiratory specimens during the acute phase of infection. Positive results are indicative of the presence of the identified virus, but do not rule out bacterial infection or co-infection with other pathogens not detected by the test. Clinical correlation with patient history and other diagnostic information is necessary to determine patient infection status. The expected result is Negative.  Fact Sheet for Patients: EntrepreneurPulse.com.au  Fact Sheet for Healthcare Providers: IncredibleEmployment.be  This test is not yet approved or cleared by the [place] FDA and  has been authorized for detection and/or diagnosis of SARS-CoV-2 by FDA under an Emergency Use Authorization (EUA).  This EUA will remain in effect (meaning this test can be  used) for the duration of  the COVID-19 declaration under Section 564(b)(1) of  the Act, 21 U.S.C. section 360bbb-3(b)(1), unless the authorization is terminated or revoked sooner.     Influenza A by PCR NEGATIVE NEGATIVE Final   Influenza B by PCR NEGATIVE NEGATIVE Final    Comment: (NOTE) The Xpert Xpress SARS-CoV-2/FLU/RSV plus assay is intended as an aid in the diagnosis of influenza from Nasopharyngeal swab specimens and should not be used as a sole basis for treatment. Nasal washings and aspirates are unacceptable for Xpert Xpress SARS-CoV-2/FLU/RSV testing.  Fact Sheet for Patients: EntrepreneurPulse.com.au  Fact Sheet for Healthcare Providers: IncredibleEmployment.be  This test is not yet approved or cleared by the [place] FDA and has been authorized for detection and/or diagnosis of SARS-CoV-2  by FDA under an Emergency Use Authorization (EUA). This EUA will remain in effect (meaning this test can be used) for the duration of the COVID-19 declaration under Section 564(b)(1) of the Act, 21 U.S.C. section 360bbb-3(b)(1), unless the authorization is terminated or revoked.  Performed at [facility], [place] [address], [place], [place] [address]     Radiology Studies:  US RENAL  Result Date: 05-26-21 CLINICAL DATA:  Acute kidney injury. EXAM: RENAL / URINARY TRACT ULTRASOUND COMPLETE COMPARISON:  CT renal stone 06/10/2019 FINDINGS: Right Kidney: Renal measurements: 10.8 x 4.0 x 3.3 cm = volume: 75 mL. Echogenicity within normal limits. No mass or hydronephrosis visualized. Left Kidney: Renal measurements: 10.2 x 0.9 x 5.4 cm = volume: 142 mL. Echogenicity within normal limits. No mass or hydronephrosis visualized. Bladder: Mild bladder wall thickening. Other: None. IMPRESSION: 1. Kidneys appear within normal limits. 2. Mild bladder wall thickening.  Correlate for cystitis. Electronically Signed   By: Ronney Asters M.D.   On: 2021-05-26 22:19    Scheduled Meds:    [START ON 05/27/2021] apixaban  5 mg Oral  BID   vitamin C  500 mg Oral Daily   aspirin EC  81 mg Oral Daily   mometasone-formoterol  2 puff Inhalation BID   pantoprazole  40 mg Oral BID   rosuvastatin  10 mg Oral Daily   sodium chloride flush  3 mL Intravenous Q12H   zinc sulfate  220 mg Oral Daily    Continuous Infusions:    heparin 1,400 Units/hr (05/26/21 0507)     LOS: 3 days     Vernell Leep, MD,  FACP, [facility], [facility], [facility] (Care Management Physician Certified) [place]  To contact the attending provider between 7A-7P or the covering provider during after hours 7P-7A, please log into the web site [email or web address] and access using universal [place] password for that web site. If you do not have the password, please call the hospital operator.  05/26/2021, 5:03 PM

## 2021-05-26 NOTE — Progress Notes (Addendum)
ANTICOAGULATION CONSULT NOTE - Follow Up Consult  Pharmacy Consult for Heparin >> Apixaban  Indication: pulmonary embolus  No Known Allergies  Patient Measurements: Height: 6\' 2"  (188 cm) Weight: 119.3 kg (263 lb 0.1 oz) IBW/kg (Calculated) : 82.2 Heparin Dosing Weight: 107.7 kg  Vital Signs: Temp: 98.1 F (36.7 C) (11/26 0732) Temp Source: Oral (11/26 0732) BP: 118/71 (11/26 0732) Pulse Rate: 50 (11/26 0732)  Labs: Recent Labs    05/24/21 0150 05/25/21 0229 05/26/21 0238  HGB 10.9* 10.3* 10.4*  HCT 34.6* 32.7* 34.0*  PLT 193 197 195  HEPARINUNFRC 0.61 0.70 0.63  CREATININE 1.63* 1.66* 1.42*    Estimated Creatinine Clearance: 60.7 mL/min (A) (by C-G formula based on SCr of 1.42 mg/dL (H)).   Medications:  Scheduled:   vitamin C  500 mg Oral Daily   aspirin EC  81 mg Oral Daily   metoprolol tartrate  12.5 mg Oral BID   mometasone-formoterol  2 puff Inhalation BID   pantoprazole  40 mg Oral BID   rosuvastatin  10 mg Oral Daily   sodium chloride flush  3 mL Intravenous Q12H   zinc sulfate  220 mg Oral Daily   Infusions:   heparin 1,400 Units/hr (05/26/21 0507)    Assessment: 75 yo M with h/o DVT on Eliquis admitted with new PE. Pharmacy consulted to dose heparin.   Heparin level today is therapeutic at 0.63, on 1400 units/hr. Hgb 10.4, plt 195. No lines issues or signs/symptoms of bleeding noted per RN.   Goal of Therapy:  Heparin level 0.3-0.7 units/ml Monitor platelets by anticoagulation protocol: Yes   Plan:  Continue heparin at 1400 units/hr. Daily CBC, heparin level. Monitor for signs/symptoms of bleeding.  Addendum: Plans noted for IV heparin x 5 days, then to resume apixaban. Given heparin was started on 11/22, will continue heparin infusion today and transition to PTA dose of apixaban 5 mg PO BID starting tomorrow morning.    Vance Peper, PharmD PGY1 Pharmacy Resident Phone [phone] 05/26/2021 7:52 AM   Please check AMION for all [place] phone numbers After 10:00 PM, call [place] [phone]

## 2021-05-26 NOTE — Progress Notes (Addendum)
Patients monitor shows sinus brady with episodes of brady down to 37-39 followed by junctional beats. Patient asymtomatic. Message sent to Dr. Algis Liming regarding above and will hold metoprolol until he is able to evalute. NT aware and will report any symptoms patient has

## 2021-05-26 NOTE — TOC Progression Note (Signed)
Transition of Care [facility]) - Progression Note    Patient Details  Name: Shane [name] MRN: [id number] Date of Birth: Oct 26, 1945  Transition of Care [facility]) CM/SW [place], [place] Phone Number: [phone] 05/26/2021, 3:55 PM  Clinical Narrative:     CSW spoke with pt about 2 bed offers however pt stated that he wanted to know more about the facilities. CSW provided pt with the medicare.gov listing. CSW informed him that CSW would follow up with him tomorrow in regards to his choice.  TOC team will continue to assist with discharge planning needs.     Expected Discharge Plan: [place] Barriers to Discharge: Continued Medical Work up  Expected Discharge Plan and Services Expected Discharge Plan: [place] In-house Referral: Clinical Social Work     Living arrangements for the past 2 months: Single Family Home                                       Social Determinants of Health (SDOH) Interventions    Readmission Risk Interventions No flowsheet data found.

## 2021-05-27 LAB — CBC
HCT: 34.3 % — ABNORMAL LOW (ref 39.0–52.0)
Hemoglobin: 10.6 g/dL — ABNORMAL LOW (ref 13.0–17.0)
MCH: 19.8 pg — ABNORMAL LOW (ref 26.0–34.0)
MCHC: 30.9 g/dL (ref 30.0–36.0)
MCV: 64 fL — ABNORMAL LOW (ref 80.0–100.0)
Platelets: 189 10*3/uL (ref 150–400)
RBC: 5.36 MIL/uL (ref 4.22–5.81)
RDW: 16.5 % — ABNORMAL HIGH (ref 11.5–15.5)
WBC: 6.8 10*3/uL (ref 4.0–10.5)
nRBC: 0 % (ref 0.0–0.2)

## 2021-05-27 LAB — SOLUBLE TRANSFERRIN RECEPTOR: Transferrin Receptor: 34.3 nmol/L — ABNORMAL HIGH (ref 12.2–27.3)

## 2021-05-27 NOTE — TOC Progression Note (Signed)
Transition of Care [facility]) - Progression Note    Patient Details  Name: Shane [name] MRN: [id number] Date of Birth: 1945-09-16  Transition of Care [facility]) CM/SW [place], [place] Phone Number: [phone] 05/27/2021, 1:21 PM  Clinical Narrative:    CSW met with pt in order to receive SNF choice. Pt chose [facility]. CSW  attempted to call [place] to confirm bed offer however was unable to reach anyone.CSW started pt authorization with HTA for a start date of 05/28/2021.  TOC team will continue to assist with discharge planning needs.   Expected Discharge Plan: [place] Barriers to Discharge: Continued Medical Work up  Expected Discharge Plan and Services Expected Discharge Plan: [place] In-house Referral: Clinical Social Work     Living arrangements for the past 2 months: Single Family Home                                       Social Determinants of Health (SDOH) Interventions    Readmission Risk Interventions No flowsheet data found.

## 2021-05-27 NOTE — Progress Notes (Signed)
Patient's brother Momen Ham here to see brother and notified nurse that he has been making decisions regarding patient's care.  He also has number for [facility] social worker if needed.  His number is listed in primary contacts.  Case manager notified and will call brother.

## 2021-05-27 NOTE — TOC Progression Note (Signed)
Transition of Care [facility]) - Progression Note    Patient Details  Name: Shane [name] MRN: [id number] Date of Birth: 13-May-1946  Transition of Care [facility]) CM/SW [place], LCSW Phone Number:[phone] 05/27/2021, 4:39 PM  Clinical Narrative:     CSW was alerted by pt's RN that his brother [name] wanted to speak with CSW. [name] explained that he has already spoken to pt's VA social worker and provided her contact information. He is requesting that we speak to them because he is concerned about pt receiving a bill under his [facility] insurance. CSW explained the parameters around the benefits that he should received under [facility].[name] has asked also to be contacted about pt's DC plans. CSW explained that pt accepted bed offer at [facility]. [name] stated that he may can go there and then be transferred to a facility with the [place].  VA social worker is [place] [phone] ext [phone].  TOC team will continue to assist with discharge planning needs.  Expected Discharge Plan: [place] Barriers to Discharge: Continued Medical Work up  Expected Discharge Plan and Services Expected Discharge Plan: [place] In-house Referral: Clinical Social Work     Living arrangements for the past 2 months: Single Family Home                                       Social Determinants of Health (SDOH) Interventions    Readmission Risk Interventions No flowsheet data found.

## 2021-05-27 NOTE — Progress Notes (Signed)
PROGRESS NOTE   Shane [name]  GMW:[id number]    DOB: Apr 13, 1946    DOA: 05/21/2021  PCP: Sheela Stack, MD @ the [facility]   I have briefly reviewed patients previous medical records in [facility]  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  Recently tested positive for COVID-19 on 11/15 at the [facility] hospital but not treated with meds.  Patient reportedly left [facility] on 9/27 and family trying to get him into an assisted living facility, currently lives alone at home with friend/family intermittently checking on him.  It appears that patient may not have been fully compliant with his Eliquis.  CTA chest showed small segmental pulmonary artery emboli in the lower lobes bilaterally, right heart strain could not be excluded.  Started on IV heparin.  Hematology was consulted and recommended IV heparin x5 days which he has completed and transitioned to [facility] 11/27.  Since patient missed at least 3 days of Eliquis prior to admission that we know off, this would not be considered as Eliquis failure.  Medically optimized for discharge to SNF pending bed and insurance approval.  TOC on board.   Assessment & Plan:  Principal Problem:   Acute pulmonary embolism (HCC) Active Problems:   Microcytic anemia   GERD   PTSD (post-traumatic stress disorder)   Sleep apnea   Essential hypertension   History of DVT (deep vein thrombosis)   Chronic anticoagulation   History of GI bleed   COVID-19 virus infection   Acute pulmonary embolism/bilateral lower extremity chronic DVTs - CTA chest showed small segmental pulmonary artery emboli in the lower lobes bilaterally and right heart strain  could not be excluded -2D echo 11/23: LVEF 70-75%, LV hyperdynamic function, no regional wall motion abnormalities, mild LVH.  RV poorly visualized but grossly normal size and systolic function. -Lower extremity venous Dopplers confirm bilateral lower extremity chronic DVTs. -It appears that patient was on Eliquis anticoagulation for history of bilateral DVT initially diagnosed in June 2020 but unclear why he still remains on it this far out. -Hematology consultation appreciated.  Recommend indefinite anticoagulation therapy.  Also confirmed with Dr. Benay Spice and he as per his recommendation, completed 5 days of IV heparin and has been transitioned to Eliquis 11/27. -  Since patient missed at least 3 days of Eliquis prior to admission that we know off, this would not be considered as Eliquis failure. -Recent COVID-19 infection and noncompliance with Eliquis may have contributed. -Patient will need to follow-up with his PCP regarding need for continuation of aspirin while on anticoagulation with DOAC's and also to definitely address the duration of anticoagulation. -Follow-up hemoglobin electrophoresis and soluble transferrin receptor results which are still pending. -Hemodynamically stable and not hypoxic -Dr. Benay Spice will follow up 11/28.  Recent COVID-19 infection -Reportedly diagnosed on 05/15/2021 at the [facility]. -No symptoms pertaining to this currently.  Not hypoxic. -No significant elevation of inflammatory markers. -Has completed 10 days of negative pressure isolation.  Discontinued airborne isolation.    Asthma -Stable without exacerbation.  Continue Advair substituted medicine.  Microcytic hypochromic anemia -Outpatient follow-up and evaluation as deemed necessary. -Stable.  Chronic diastolic CHF -Clinically euvolemic.  Essential hypertension - Significant SB into the 40's  and very occ in 30's on 11/26. DC'ed Metoprolol. TSH elevated at 5.181, work-up as below.  Elevated  TSH - TSH 5.181.  Check free T3 and free T4.  Acute kidney injury  -Creat has bumped up from 1.19-1.44-1.63.  Creatinine has plateaued compared to yesterday. -Discontinued Lasix.  Given soft blood pressures, and bradycardia, discontinued metoprolol.   -AKI suspected due to soft blood pressures and contrast induced nephropathy. -Renal ultrasound: Kidneys WNL. -Avoid nephrotoxic's. -Brief IV fluids.  Clinically euvolemic.  Creatinine starting to trend down/1.42.  Although ordered, unsure why BMP was not done today, check in a.m.  History of GI bleed secondary to gastric ulcers -Underwent EGD by Dr. Bryan Lemma 5/3 which revealed oozing gastric ulcers treated with clips, nonobstructive Schatzki's ring, gastritis, duodenitis and single duodenal polyp that was resected and clips placed. -Continue Protonix -Continue anticoagulation with close monitoring.  Hopefully he does not have an anticoagulation limiting GI bleed. - As discussed with pharmacy, no clear indication for him being on aspirin 325 Mg daily and hence reduced to 81 Mg daily to minimize risk of GI bleed.  Hyperlipidemia -Continue statins  Anxiety/depression/PTSD -Reportedly recently started on mood medications last week prior to admission  Incidental findings noted on CTA chest that will need to be followed closely as outpatient: CT noted ascending aorta measuring up to 4 cm, right middle lobe pulmonary nodule unchanged from prior, small hiatal hernia, concern for pulmonary artery hypertension, and cardiomegaly with coronary artery calcifications. -Recommend screening for AAA  ? Cognitive impairment - As per patient's brother, no formal diagnosis of dementia and reportedly slow to warm up to people but otherwise coherent.  However this is not the case in the hospital.  Body mass index is 32.44 kg/m./Obesity    DVT prophylaxis:   Currently on IV heparin   Code Status: DNR Family Communication: None at bedside. Disposition:   Status is: Inpatient.  Patient is medically optimized for discharge to SNF.       Consultants:   Hematology  Procedures:   None  Antimicrobials:      Subjective:  Chronic poor historian.  No new complaints.  Objective:   Vitals:   05/27/21 0345 05/27/21 0745 05/27/21 0900 05/27/21 1257  BP: 140/76 125/80  127/76  Pulse: (!) 57 (!) 50  66  Resp: 18 12  18   Temp: 97.9 F (36.6 C) 98 F (36.7 C)  98.2 F (36.8 C)  TempSrc: Oral Oral  Oral  SpO2: 99% 98% 99% 98%  Weight: 114.6 kg     Height:        General exam: Elderly male, moderately built and nourished lying comfortably propped up in bed without distress.  Oral mucosa moist. Respiratory system: Clear to auscultation.  No increased work of breathing. Cardiovascular system: S1 and S2 heard, regular bradycardia.  No JVD or murmurs.  No pedal edema.  Telemetry personally reviewed: Sinus bradycardia in the 40s-50s with first-degree AV block Gastrointestinal system: Abdomen is nondistended, soft and nontender. No organomegaly or masses felt. Normal bowel sounds heard. Central nervous system: Alert and oriented x2. No focal neurological deficits. Extremities: Symmetric 5 x 5 power. Skin: No rashes, lesions or ulcers Psychiatry: Judgement and insight appear impaired. Mood & affect mostly flat.     Data Reviewed:   I have personally reviewed following labs and imaging studies   CBC: Recent Labs  Lab 05/21/21 2034 05/22/21 0916 05/25/21 0229 05/26/21 0238 05/27/21 0222  WBC 6.2   < > 6.3 6.4 6.8  NEUTROABS 3.9  --   --   --   --   HGB 12.1*   < > 10.3* 10.4* 10.6*  HCT 39.0   < > 32.7* 34.0* 34.3*  MCV 64.0*   < > 63.2* 63.2* 64.0*  PLT 207   < > 197 195 189   < > = values in this interval not displayed.    Basic Metabolic Panel: Recent Labs  Lab 05/21/21 2034 05/22/21 0916 05/23/21 0735 05/24/21 0150 05/25/21 0229 05/26/21 0238  NA 138  --  135 136 138 137  K 3.9  --  4.0 3.8 3.8 3.7  CL 101   --  101 103 106 106  CO2 26  --  24 25 26 25   GLUCOSE 109*  --  86 95 95 111*  BUN 7*  --  11 19 19 13   CREATININE 1.19  --  1.44* 1.63* 1.66* 1.42*  CALCIUM 9.5  --  8.9 8.5* 8.5* 8.7*  MG  --  1.8  --   --   --   --   PHOS  --  2.8  --   --   --   --     Liver Function Tests: Recent Labs  Lab 05/21/21 2034  AST 18  ALT 11  ALKPHOS 80  BILITOT 0.8  PROT 7.4  ALBUMIN 3.9    CBG: No results for input(s): GLUCAP in the last 168 hours.  Microbiology Studies:   Recent Results (from the past 240 hour(s))  Resp Panel by RT-PCR (Flu A&B, Covid) Urine, Clean Catch     Status: Abnormal   Collection Time: 05/21/21  8:18 PM   Specimen: Urine, Clean Catch; Nasopharyngeal(NP) swabs in vial transport medium  Result Value Ref Range Status   SARS Coronavirus 2 by RT PCR POSITIVE (A) NEGATIVE Final    Comment: RESULT CALLED TO, READ BACK BY AND VERIFIED WITH: RN BOBBY S. 05/21/21@22 :16 BY TW (NOTE) SARS-CoV-2 target nucleic acids are DETECTED.  The SARS-CoV-2 RNA is generally detectable in upper respiratory specimens during the acute phase of infection. Positive results are indicative of the presence of the identified virus, but do not rule out bacterial infection or co-infection with other pathogens not detected by the test. Clinical correlation with patient history and other diagnostic information is necessary to determine patient infection status. The expected result is Negative.  Fact Sheet for Patients: EntrepreneurPulse.com.au  Fact Sheet for Healthcare Providers: IncredibleEmployment.be  This test is not yet approved or cleared by the [place] FDA and  has been authorized for detection and/or diagnosis of SARS-CoV-2 by FDA under an Emergency Use Authorization (EUA).  This EUA will remain in effect (meaning this test can be  used) for the duration of  the COVID-19 declaration under Section 564(b)(1) of the Act, 21 U.S.C. section  360bbb-3(b)(1), unless the authorization is terminated or revoked sooner.     Influenza A by PCR NEGATIVE NEGATIVE Final   Influenza B by PCR NEGATIVE NEGATIVE Final    Comment: (NOTE) The Xpert Xpress SARS-CoV-2/FLU/RSV plus assay is intended as an aid in the diagnosis of influenza from Nasopharyngeal swab specimens and should not be used as a sole basis for treatment. Nasal washings and aspirates are unacceptable for Xpert Xpress SARS-CoV-2/FLU/RSV testing.  Fact Sheet for Patients: EntrepreneurPulse.com.au  Fact Sheet for Healthcare Providers: IncredibleEmployment.be  This test is not yet approved or cleared by the [place] FDA and has been authorized for detection and/or diagnosis of SARS-CoV-2 by FDA  under an Emergency Use Authorization (EUA). This EUA will remain in effect (meaning this test can be used) for the duration of the COVID-19 declaration under Section 564(b)(1) of the Act, 21 U.S.C. section 360bbb-3(b)(1), unless the authorization is terminated or revoked.  Performed at [facility], [place] [address] [place], [place] [address]     Radiology Studies:  US RENAL  Result Date: 06-03-2021 CLINICAL DATA:  Acute kidney injury. EXAM: RENAL / URINARY TRACT ULTRASOUND COMPLETE COMPARISON:  CT renal stone 06/10/2019 FINDINGS: Right Kidney: Renal measurements: 10.8 x 4.0 x 3.3 cm = volume: 75 mL. Echogenicity within normal limits. No mass or hydronephrosis visualized. Left Kidney: Renal measurements: 10.2 x 0.9 x 5.4 cm = volume: 142 mL. Echogenicity within normal limits. No mass or hydronephrosis visualized. Bladder: Mild bladder wall thickening. Other: None. IMPRESSION: 1. Kidneys appear within normal limits. 2. Mild bladder wall thickening.  Correlate for cystitis. Electronically Signed   By: Ronney Asters M.D.   On: 06/03/2021 22:19    Scheduled Meds:    apixaban  5 mg Oral BID   vitamin C  500 mg Oral Daily   aspirin EC   81 mg Oral Daily   mometasone-formoterol  2 puff Inhalation BID   pantoprazole  40 mg Oral BID   rosuvastatin  10 mg Oral Daily   sodium chloride flush  3 mL Intravenous Q12H   zinc sulfate  220 mg Oral Daily    Continuous Infusions:       LOS: 4 days     Vernell Leep, MD,  FACP, [facility], [facility], [facility] (Care Management Physician Certified) [place]  To contact the attending provider between 7A-7P or the covering provider during after hours 7P-7A, please log into the web site [email or web address] and access using universal [place] password for that web site. If you do not have the password, please call the hospital operator.  05/27/2021, 4:35 PM

## 2021-05-27 NOTE — Progress Notes (Signed)
Patient refused CPAP at this time.

## 2021-05-28 DIAGNOSIS — I2699 Other pulmonary embolism without acute cor pulmonale: Secondary | ICD-10-CM | POA: Diagnosis not present

## 2021-05-28 LAB — BASIC METABOLIC PANEL
Anion gap: 6 (ref 5–15)
BUN: 9 mg/dL (ref 8–23)
CO2: 24 mmol/L (ref 22–32)
Calcium: 8.7 mg/dL — ABNORMAL LOW (ref 8.9–10.3)
Chloride: 109 mmol/L (ref 98–111)
Creatinine, Ser: 1.23 mg/dL (ref 0.61–1.24)
GFR, Estimated: >60 mL/min (ref >60)
Glucose, Bld: 144 mg/dL — ABNORMAL HIGH (ref 70–99)
Potassium: 3.7 mmol/L (ref 3.5–5.1)
Sodium: 139 mmol/L (ref 135–145)

## 2021-05-28 LAB — HGB FRACTIONATION CASCADE
Hgb A2: 5.2 % — ABNORMAL HIGH (ref 1.8–3.2)
Hgb A: 94.8 % — ABNORMAL LOW (ref 96.4–98.8)
Hgb F: 0 % (ref 0.0–2.0)
Hgb S: 0 %

## 2021-05-28 LAB — T4, FREE: Free T4: 0.9 ng/dL (ref 0.61–1.12)

## 2021-05-28 MED ORDER — FERROUS SULFATE 325 (65 FE) MG PO TABS
325.0000 mg | ORAL_TABLET | Freq: Two times a day (BID) | ORAL | Status: DC
  Administered 2021-05-28 – 2021-05-29 (×4): 325 mg via ORAL
  Filled 2021-05-28 (×4): qty 1

## 2021-05-28 NOTE — TOC Progression Note (Signed)
Transition of Care [facility]) - Progression Note    Patient Details  Name: Shane [name] MRN: [id number] Date of Birth: 10/06/1945  Transition of Care [facility]) CM/SW [place], [place] Phone Number: 05/28/2021, 10:22 AM  Clinical Narrative:     CSW called to check on insurance authorization for patient. Patients insurance has gone to peer to peer for SNF. Deadline to complete peer to peer is by the end of the day. Telephone number for peer to peer is [phone] ask for Dr. Amalia Hailey. MD informed. Patient was approved for [facility] [phone]. CSW awaiting peer to peer determination. CSW will continue to follow and assist with patients dc planning needs.  Expected Discharge Plan: [place] Barriers to Discharge: Continued Medical Work up  Expected Discharge Plan and Services Expected Discharge Plan: [place] In-house Referral: Clinical Social Work     Living arrangements for the past 2 months: Single Family Home Expected Discharge Date: 05/28/21                                     Social Determinants of Health (SDOH) Interventions    Readmission Risk Interventions No flowsheet data found.

## 2021-05-28 NOTE — Progress Notes (Signed)
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  Recently tested positive for COVID-19 on 11/15 at the [facility] but not treated with meds.  Patient reportedly left [facility] on 9/27 and family trying to get him into an assisted living facility, currently lives alone at home with friend/family intermittently checking on him.  It appears that patient may not have been fully compliant with his Eliquis.  CTA chest showed small segmental pulmonary artery emboli in the lower lobes bilaterally, right heart strain could not be excluded.  Started on IV heparin.  Hematology was consulted and recommended IV heparin x5 days which he has completed and transitioned to [facility] 11/27.  Since patient missed at least 3 days of Eliquis prior to admission that we know off, this would not be considered as Eliquis failure.   Medically optimized for discharge to SNF pending bed and insurance approval.  TOC on board.  Assessment & Plan: Principal Problem:   Acute pulmonary embolism (HCC) Active Problems:   Microcytic anemia   GERD   PTSD (post-traumatic stress disorder)   Sleep apnea   Essential hypertension   History of DVT (deep vein thrombosis)   Chronic anticoagulation   History of GI bleed   COVID-19 virus infection  Acute pulmonary embolism/bilateral lower extremity chronic DVTs: CTA chest showed small segmental pulmonary artery emboli in the lower lobes bilaterally and right heart strain could not be excluded. 2D echo 11/23: LVEF 70-75%, LV hyperdynamic function, no regional wall motion abnormalities, mild LVH.  RV poorly visualized but grossly normal size and systolic function.  Lower extremity venous Dopplers confirm bilateral lower extremity chronic DVTs. - It appears that patient was on Eliquis anticoagulation for history of bilateral DVT initially diagnosed in June 2020 but unclear why he still remains on it this far out, regardless, suspect he was incompletely adherent, thus not a tx failure of eliquis.  - Hematology consultation appreciated. Recommend indefinite anticoagulation therapy.  Also confirmed with Dr. Benay Spice and he as per his recommendation, completed 5 days of IV heparin and has been transitioned to Eliquis 11/27. - Follow-up hemoglobin electrophoresis and soluble transferrin receptor results which are still pending.   Recent COVID-19 infection: Reportedly diagnosed on 05/15/2021 at the [facility]. No symptoms pertaining to this currently.  Not hypoxic. No significant elevation of inflammatory markers. - Has completed 10 days of negative pressure isolation.  Discontinued airborne isolation.     Asthma: Stable without exacerbation.  - Continue Advair substituted medicine.   Microcytic hypochromic anemia: Hematology suspected iron deficiency anemia, possible beta thalassemia. May have an element of chronic blood loss anemia from previous upper GI bleed. No current bleeding to indicate inpatient work up.  - Trial oral iron per their recommendations, repeat iron studies, CBC after this.  - Follow up with hematology/oncology after discharge.    Chronic diastolic CHF: Clinically euvolemic. - Tx HTN as below.   Essential hypertension - Holding metoprolol due to soft BP and sinus bradycardia into 30's.    Elevated TSH: T4 wnl.  - Consider recheck at follow up.    Acute kidney injury: Improving. WNL renal U/S. CrCl currently >34ml/min.  - DC metoprolol to avoid hypotension-mediated ATN.  - Avoid nephrotoxins.  History of GI bleed secondary to gastric ulcers: Underwent EGD by Dr. Bryan Lemma 5/3 which revealed oozing gastric ulcers treated with clips,  nonobstructive Schatzki's ring, gastritis, duodenitis and single duodenal polyp that was resected and clips placed. - Continue protonix - Continue anticoagulation with close monitoring.  Hopefully he does not have an anticoagulation limiting GI bleed. - As discussed with pharmacy, no clear indication for him being on aspirin 325 Mg daily and hence reduced to 81 Mg daily to minimize risk of GI bleed.   Hyperlipidemia - Continue statin   Anxiety/depression/PTSD - Reportedly recently started on mood medications last week prior to admission   Incidental findings noted on CTA chest that will need to be followed closely as outpatient: CT noted ascending aorta measuring up to 4 cm, right middle lobe pulmonary nodule unchanged from prior, small hiatal hernia, concern for pulmonary artery hypertension, and cardiomegaly with coronary artery calcifications. - Recommend ongoing surveillance of small AAA, unchanged pulmonary nodule.    Suspected cognitive impairment: As per patient's brother, no formal diagnosis of dementia and reportedly slow to warm up to people but otherwise coherent. However this is not the case in the hospital. Cannot r/o delirium in this setting.  - Suggest outpatient neuropsychiatric evaluation.  Obesity: Estimated body mass index is 34.19 kg/m as calculated from the following:   Height as of this encounter: 6\' 2"  (1.88 m).   Weight as of this encounter: 120.8 kg.  DVT prophylaxis: Eliquis Code Status: DNR Family Communication: None at bedside Disposition Plan:  Status is: Inpatient  Remains inpatient appropriate because: Unsafe DC  Consultants:  Hematology/oncology  Procedures:  None  Antimicrobials: None   Subjective: Pt withdrawn but denies bleeding or chest pain or dyspnea.   Objective: Vitals:   05/28/21 0603 05/28/21 0658 05/28/21 0751 05/28/21 1429  BP: 138/90   (!) 134/94  Pulse: (!) 53   61  Resp: 11   19  Temp: 98 F (36.7 C)   98.2 F (36.8 C)   TempSrc: Oral   Oral  SpO2: 98%  99% 97%  Weight:  120.8 kg    Height:        Intake/Output Summary (Last 24 hours) at 05/28/2021 1743 Last data filed at 05/28/2021 0834 Gross per 24 hour  Intake 603 ml  Output 775 ml  Net -172 ml   Filed Weights   05/26/21 0401 05/27/21 0345 05/28/21 0658  Weight: 119.3 kg 114.6 kg 120.8 kg    Gen: 75 y.o. male in no distress, chronically ill-appearing Pulm: Non-labored breathing room air, 100%. Clear to auscultation bilaterally.  CV: Regular rate and rhythm. No murmur, rub, or gallop. No JVD, no pitting pedal edema. GI: Abdomen soft, non-tender, non-distended, with normoactive bowel sounds. No organomegaly or masses felt. Ext: Warm, no deformities Skin: No rashes, lesions or ulcers on visualized skin Neuro: Alert, not cooperative with exam.  Psych: Calm, withdrawn, otherwise UTD.  Data Reviewed: I have personally reviewed following labs and imaging studies  CBC: Recent Labs  Lab 05/21/21 2034 05/22/21 0916 05/23/21 0735 05/24/21 0150 05/25/21 0229 05/26/21 0238 05/27/21 0222  WBC 6.2   < > 6.4 8.1 6.3 6.4 6.8  NEUTROABS 3.9  --   --   --   --   --   --   HGB 12.1*   < > 11.5* 10.9* 10.3* 10.4* 10.6*  HCT 39.0   < > 36.0* 34.6* 32.7* 34.0* 34.3*  MCV 64.0*   < > 63.3* 62.6* 63.2* 63.2* 64.0*  PLT 207   < > 207 193 197 195 189   < > = values in this interval not displayed.   Basic Metabolic Panel: Recent Labs  Lab 05/22/21 0916 05/23/21 0735 05/24/21 0150 05/25/21 0229 05/26/21 0238 05/28/21 0235  NA  --  135 136 138 137 139  K  --  4.0 3.8 3.8 3.7 3.7  CL  --  101 103 106 106 109  CO2  --  24 25 26 25 24   GLUCOSE  --  86 95 95 111* 144*  BUN  --  11 19 19 13 9   CREATININE  --  1.44* 1.63* 1.66* 1.42* 1.23  CALCIUM  --  8.9 8.5* 8.5* 8.7* 8.7*  MG 1.8  --   --   --   --   --   PHOS 2.8  --   --   --   --   --    GFR: Estimated Creatinine Clearance: 70.5 mL/min (by C-G formula based on SCr of 1.23 mg/dL). Liver  Function Tests: Recent Labs  Lab 05/21/21 2034  AST 18  ALT 11  ALKPHOS 80  BILITOT 0.8  PROT 7.4  ALBUMIN 3.9   Recent Labs  Lab 05/21/21 2034  LIPASE 25   No results for input(s): AMMONIA in the last 168 hours. Coagulation Profile: Recent Labs  Lab 05/22/21 0320  INR 1.3*   Cardiac Enzymes: No results for input(s): CKTOTAL, CKMB, CKMBINDEX, TROPONINI in the last 168 hours. BNP (last 3 results) No results for input(s): PROBNP in the last 8760 hours. HbA1C: No results for input(s): HGBA1C in the last 72 hours. CBG: No results for input(s): GLUCAP in the last 168 hours. Lipid Profile: No results for input(s): CHOL, HDL, LDLCALC, TRIG, CHOLHDL, LDLDIRECT in the last 72 hours. Thyroid Function Tests: Recent Labs    05/26/21 0239 05/28/21 0235  TSH 5.181*  --   FREET4  --  0.90   Anemia Panel: No results for input(s): VITAMINB12, FOLATE, FERRITIN, TIBC, IRON, RETICCTPCT in the last 72 hours. Urine analysis:    Component Value Date/Time   COLORURINE YELLOW 05/21/2021 2017   APPEARANCEUR HAZY (A) 05/21/2021 2017   LABSPEC 1.011 05/21/2021 2017   PHURINE 7.0 05/21/2021 2017   GLUCOSEU NEGATIVE 05/21/2021 2017   HGBUR NEGATIVE 05/21/2021 2017   BILIRUBINUR NEGATIVE 05/21/2021 2017   KETONESUR NEGATIVE 05/21/2021 2017   PROTEINUR NEGATIVE 05/21/2021 2017   NITRITE NEGATIVE 05/21/2021 2017   LEUKOCYTESUR TRACE (A) 05/21/2021 2017   Recent Results (from the past 240 hour(s))  Resp Panel by RT-PCR (Flu A&B, Covid) Urine, Clean Catch     Status: Abnormal   Collection Time: 05/21/21  8:18 PM   Specimen: Urine, Clean Catch; Nasopharyngeal(NP) swabs in vial transport medium  Result Value Ref Range Status   SARS Coronavirus 2 by RT PCR POSITIVE (A) NEGATIVE Final    Comment: RESULT CALLED TO, READ BACK BY AND VERIFIED WITH: RN BOBBY S. 05/21/21@22 :16 BY TW (NOTE) SARS-CoV-2 target nucleic acids are DETECTED.  The SARS-CoV-2 RNA is generally detectable in upper  respiratory specimens during the acute phase of infection. Positive results are indicative of the presence of the identified virus, but do not rule out bacterial infection or co-infection with other pathogens not detected by the test. Clinical correlation with patient history and other diagnostic information is necessary to determine patient infection status. The expected result is Negative.  Fact Sheet for Patients: EntrepreneurPulse.com.au  Fact Sheet for Healthcare Providers: IncredibleEmployment.be  This test  is not yet approved or cleared by the [place] and  has been authorized for detection and/or diagnosis of SARS-CoV-2 by FDA under an Emergency Use Authorization (EUA).  This EUA will remain in effect (meaning this test can be  used) for the duration of  the COVID-19 declaration under Section 564(b)(1) of the Act, 21 U.S.C. section 360bbb-3(b)(1), unless the authorization is terminated or revoked sooner.     Influenza A by PCR NEGATIVE NEGATIVE Final   Influenza B by PCR NEGATIVE NEGATIVE Final    Comment: (NOTE) The Xpert Xpress SARS-CoV-2/FLU/RSV plus assay is intended as an aid in the diagnosis of influenza from Nasopharyngeal swab specimens and should not be used as a sole basis for treatment. Nasal washings and aspirates are unacceptable for Xpert Xpress SARS-CoV-2/FLU/RSV testing.  Fact Sheet for Patients: EntrepreneurPulse.com.au  Fact Sheet for Healthcare Providers: IncredibleEmployment.be  This test is not yet approved or cleared by the [place] FDA and has been authorized for detection and/or diagnosis of SARS-CoV-2 by FDA under an Emergency Use Authorization (EUA). This EUA will remain in effect (meaning this test can be used) for the duration of the COVID-19 declaration under Section 564(b)(1) of the Act, 21 U.S.C. section 360bbb-3(b)(1), unless the authorization is  terminated or revoked.  Performed at [facility], [place] [address], [place], [place] [address]       Radiology Studies: No results found.  Scheduled Meds:  apixaban  5 mg Oral BID   vitamin C  500 mg Oral Daily   aspirin EC  81 mg Oral Daily   ferrous sulfate  325 mg Oral BID WC   mometasone-formoterol  2 puff Inhalation BID   pantoprazole  40 mg Oral BID   rosuvastatin  10 mg Oral Daily   sodium chloride flush  3 mL Intravenous Q12H   zinc sulfate  220 mg Oral Daily   Continuous Infusions:   LOS: 5 days   Time spent: 25 minutes.  Patrecia Pour, MD [facility] [email or web address] 05/28/2021, 5:43 PM

## 2021-05-28 NOTE — Progress Notes (Signed)
PT Cancellation Note  Patient Details Name: Shane [name] MRN: [id number] DOB: 07/18/1945   Cancelled Treatment:    Reason Eval/Treat Not Completed: Other (comment).  Pt is off precautions, has stable vitals on telemetry.  Sleepy, cannot awaken to sit up and ck his orthostatics.  Retry as time and pt allow.   Ramond Dial 05/28/2021, 2:07 PM  Mee Hives, PT PhD Acute Rehab Dept. Number: [place]

## 2021-05-28 NOTE — Progress Notes (Addendum)
HEMATOLOGY-ONCOLOGY PROGRESS NOTE  SUBJECTIVE: He is now off IV heparin and currently on Eliquis 5 mg twice daily.  No bleeding reported.  He offers no other complaints today.  PHYSICAL EXAMINATION:  Vitals:   05/28/21 0603 05/28/21 0751  BP: 138/90   Pulse: (!) 53   Resp: 11   Temp: 98 F (36.7 C)   SpO2: 98% 99%   Filed Weights   05/26/21 0401 05/27/21 0345 05/28/21 0658  Weight: 119.3 kg 114.6 kg 120.8 kg    Intake/Output from previous day: 11/27 0701 - 11/28 0700 In: 1781 [P.O.:1080; I.V.:701] Out: 1025 [Urine:1025]  GENERAL: Alert, no distress LUNGS: clear to auscultation and percussion with normal breathing effort HEART: regular rate & rhythm and no murmurs and trace lower extremity edema ABDOMEN:abdomen soft, non-tender and normal bowel sounds NEURO: Alert, follows commands  LABORATORY DATA:  I have reviewed the data as listed CMP Latest Ref Rng & Units 05/28/2021 05/26/2021 05/25/2021  Glucose 70 - 99 mg/dL 144(H) 111(H) 95  BUN 8 - 23 mg/dL 9 13 19   Creatinine 0.61 - 1.24 mg/dL 1.23 1.42(H) 1.66(H)  Sodium 135 - 145 mmol/L 139 137 138  Potassium 3.5 - 5.1 mmol/L 3.7 3.7 3.8  Chloride 98 - 111 mmol/L 109 106 106  CO2 22 - 32 mmol/L 24 25 26   Calcium 8.9 - 10.3 mg/dL 8.7(L) 8.7(L) 8.5(L)  Total Protein 6.5 - 8.1 g/dL - - -  Total Bilirubin 0.3 - 1.2 mg/dL - - -  Alkaline Phos 38 - 126 U/L - - -  AST 15 - 41 U/L - - -  ALT 0 - 44 U/L - - -    Lab Results  Component Value Date   WBC 6.8 05/27/2021   HGB 10.6 (L) 05/27/2021   HCT 34.3 (L) 05/27/2021   MCV 64.0 (L) 05/27/2021   PLT 189 05/27/2021   NEUTROABS 3.9 05/21/2021    DG Chest 2 View  Result Date: 05/21/2021 CLINICAL DATA:  Chest pain. EXAM: CHEST - 2 VIEW COMPARISON:  Chest radiograph dated 10/26/2020. FINDINGS: Minimal bibasilar atelectasis. Right paratracheal density may represent atelectasis related to shallow inspiration or mediastinal vasculature. Developing infiltrate is not excluded.  Follow-up recommended. No pleural effusion, pneumothorax. Stable cardiomegaly. Degenerative changes of the spine. No acute osseous pathology. IMPRESSION: Right apical/paratracheal density may represent atelectasis or infiltrate. Continued follow-up recommended. Electronically Signed   By: Anner Crete M.D.   On: 05/21/2021 21:52   CT Angio Chest Pulmonary Embolism (PE) W or WO Contrast  Addendum Date: 05/22/2021   ADDENDUM REPORT: 05/22/2021 02:13 ADDENDUM: Critical findings were reported to Dr. Waverly Ferrari at 2:11 a.m. Electronically Signed   By: Brett Fairy M.D.   On: 05/22/2021 02:13   Result Date: 05/22/2021 CLINICAL DATA:  Chest and back pain. PE suspected, high probability. EXAM: CT ANGIOGRAPHY CHEST WITH CONTRAST TECHNIQUE: Multidetector CT imaging of the chest was performed using the standard protocol during bolus administration of intravenous contrast. Multiplanar CT image reconstructions and MIPs were obtained to evaluate the vascular anatomy. CONTRAST:  4mL OMNIPAQUE IOHEXOL 350 MG/ML SOLN COMPARISON:  Nov 22, 2020. FINDINGS: Cardiovascular: The heart is normal in size and there is no pericardial effusion. A few scattered coronary artery calcifications are noted. There is mild dilatation of the ascending aorta measuring up to 4.0 cm in diameter. The pulmonary trunk is distended which may be associated with underlying pulmonary artery hypertension. A few tiny subsegmental pulmonary artery filling defects are noted in the lower lobes bilaterally. The right  ventricle is mildly distended which is similar in appearance to the prior exam. Mediastinum/Nodes: No enlarged mediastinal, hilar, or axillary lymph nodes. Thyroid gland, trachea, and esophagus demonstrate no significant findings. Lungs/Pleura: Mild atelectasis is noted bilaterally. There is a 3 mm ground-glass nodule in the right middle lobe, axial image 77. No effusion or pneumothorax. Upper Abdomen: There is a small hiatal hernia. A few  scattered calcifications are present in the pancreas which may be related to chronic pancreatitis. There is thickening of the left adrenal gland without evidence of discrete nodule. The right adrenal gland is within normal limits. Musculoskeletal: Gynecomastia is noted bilaterally. Degenerative changes are present in the thoracic spine. Review of the MIP images confirms the above findings. IMPRESSION: 1. Small segmental pulmonary artery emboli defects in the lower lobes bilaterally. The right ventricle is distended which is similar in appearance to the prior exam and may be chronic, however right heart strain can not be excluded. 2. Distended pulmonary trunk which may be associated with underlying pulmonary artery hypertension. 3. Cardiomegaly with coronary artery calcifications. 4. Mild dilatation of the ascending aorta measuring up to 4.0 cm. Recommend annual imaging followup by CTA or MRA. This recommendation follows 2010 ACCF/AHA/AATS/ACR/ASA/SCA/SCAI/SIR/STS/SVM Guidelines for the Diagnosis and Management of Patients with Thoracic Aortic Disease. Circulation. 2010; 121: N829-F621. Aortic aneurysm NOS (ICD10-I71.9). 5. Small hiatal hernia. 6. Right middle lobe pulmonary nodule, unchanged from the prior exam. Electronically Signed: By: Brett Fairy M.D. On: 05/22/2021 02:05   US RENAL  Result Date: 05/25/2021 CLINICAL DATA:  Acute kidney injury. EXAM: RENAL / URINARY TRACT ULTRASOUND COMPLETE COMPARISON:  CT renal stone 06/10/2019 FINDINGS: Right Kidney: Renal measurements: 10.8 x 4.0 x 3.3 cm = volume: 75 mL. Echogenicity within normal limits. No mass or hydronephrosis visualized. Left Kidney: Renal measurements: 10.2 x 0.9 x 5.4 cm = volume: 142 mL. Echogenicity within normal limits. No mass or hydronephrosis visualized. Bladder: Mild bladder wall thickening. Other: None. IMPRESSION: 1. Kidneys appear within normal limits. 2. Mild bladder wall thickening.  Correlate for cystitis. Electronically Signed    By: Ronney Asters M.D.   On: 05/25/2021 22:19   ECHOCARDIOGRAM COMPLETE  Result Date: 05/23/2021    ECHOCARDIOGRAM REPORT   Patient Name:   Shane [name] Date of Exam: 05/23/2021 Medical Rec #:  [id number]          Height:       74.0 in Accession #:    [id number]         Weight:       248.1 lb Date of Birth:  January 03, 1946          BSA:          2.382 m Patient Age:    74 years           BP:           108/74 mmHg Patient Gender: M                  HR:           72 bpm. Exam Location:  Inpatient Procedure: 2D Echo, Cardiac Doppler, Color Doppler and Intracardiac            Opacification Agent Indications:    Pulmonary Embolus I26.09  History:        Patient has prior history of Echocardiogram examinations, most                 recent 10/27/2020. Risk Factors:Sleep Apnea and Current Smoker.  Bilarteral DVTS. COVID infection. Shortness of breath. Left                 sided chest pain. GERD.  Sonographer:    Darlina Sicilian RDCS Referring Phys: [phone] ANAND D HONGALGI  Sonographer Comments: Image acquisition challenging due to respiratory motion and Restricted mobility due to chest pain. IMPRESSIONS  1. Techincally difficult study with limited views. Left ventricular ejection fraction, by estimation, is 70 to 75%. The left ventricle has hyperdynamic function. The left ventricle has no regional wall motion abnormalities. There is mild left ventricular hypertrophy. Left ventricular diastolic parameters were normal.  2. Right ventricule is poorly visualized but grossly normal size and systolic function  3. The mitral valve is normal in structure. No evidence of mitral valve regurgitation.  4. The aortic valve is tricuspid. Aortic valve regurgitation is not visualized. No aortic stenosis is present. FINDINGS  Left Ventricle: Left ventricular ejection fraction, by estimation, is 70 to 75%. The left ventricle has hyperdynamic function. The left ventricle has no regional wall motion abnormalities. Definity  contrast agent was given IV to delineate the left ventricular endocardial borders. The left ventricular internal cavity size was normal in size. There is mild left ventricular hypertrophy. Left ventricular diastolic parameters were normal. Right Ventricle: The right ventricular size is not well visualized. Right vetricular wall thickness was not well visualized. Right ventricular systolic function was not well visualized. Left Atrium: Left atrial size was normal in size. Right Atrium: Right atrial size was not well visualized. Pericardium: There is no evidence of pericardial effusion. Mitral Valve: The mitral valve is normal in structure. No evidence of mitral valve regurgitation. Tricuspid Valve: The tricuspid valve is normal in structure. Tricuspid valve regurgitation is trivial. Aortic Valve: The aortic valve is tricuspid. Aortic valve regurgitation is not visualized. No aortic stenosis is present. Pulmonic Valve: The pulmonic valve was not well visualized. Pulmonic valve regurgitation is trivial. Aorta: The aortic root is normal in size and structure. IAS/Shunts: The interatrial septum was not well visualized.  LEFT VENTRICLE PLAX 2D LVIDd:         4.00 cm   Diastology LVIDs:         2.80 cm   LV e' medial:    9.32 cm/s LV PW:         1.00 cm   LV E/e' medial:  7.9 LV IVS:        1.20 cm   LV e' lateral:   13.70 cm/s LVOT diam:     2.20 cm   LV E/e' lateral: 5.4 LVOT Area:     3.80 cm  RIGHT VENTRICLE RV S prime:     20.70 cm/s TAPSE (M-mode): 2.1 cm LEFT ATRIUM             Index LA Vol (A2C):   49.4 ml 20.74 ml/m LA Vol (A4C):   25.6 ml 10.75 ml/m LA Biplane Vol: 36.9 ml 15.49 ml/m   AORTA Ao Root diam: 3.60 cm MITRAL VALVE MV Area (PHT): 2.94 cm     SHUNTS MV Decel Time: 258 msec     Systemic Diam: 2.20 cm MV E velocity: 73.30 cm/s MV A velocity: 110.00 cm/s MV E/A ratio:  0.67 Oswaldo Milian MD Electronically signed by Oswaldo Milian MD Signature Date/Time: 05/23/2021/4:18:40 PM    Final     VAS [place] LOWER EXTREMITY VENOUS (DVT)  Result Date: 05/24/2021  Lower Venous DVT Study Patient Name:  CHEIKH BRAMBLE  Date of Exam:  05/23/2021 Medical Rec #: [id number]           Accession #:    [id number] Date of Birth: 1946-01-01           Patient Gender: M Patient Age:   60 years Exam Location:  [facility] Procedure:      VAS [place] LOWER EXTREMITY VENOUS (DVT) Referring Phys: ANAND HONGALGI --------------------------------------------------------------------------------  Indications: Pulmonary embolism.  Anticoagulation: Heparin. Comparison Study: 10-30-2020 Most recent prior lower extremity venous was                   positive for chronic DVT bilaterally. Performing Technologist: Rogelia Rohrer RVT, RDMS  Examination Guidelines: A complete evaluation includes B-mode imaging, spectral Doppler, color Doppler, and power Doppler as needed of all accessible portions of each vessel. Bilateral testing is considered an integral part of a complete examination. Limited examinations for reoccurring indications may be performed as noted. The reflux portion of the exam is performed with the patient in reverse Trendelenburg.  +---------+---------------+---------+-----------+----------+-------------------+ RIGHT    CompressibilityPhasicitySpontaneityPropertiesThrombus Aging      +---------+---------------+---------+-----------+----------+-------------------+ CFV      Full           Yes      Yes                                      +---------+---------------+---------+-----------+----------+-------------------+ SFJ      Full                                                             +---------+---------------+---------+-----------+----------+-------------------+ FV Prox  Partial        Yes      Yes                  Chronic             +---------+---------------+---------+-----------+----------+-------------------+ FV Mid   Partial        Yes      Yes                  Chronic              +---------+---------------+---------+-----------+----------+-------------------+ FV DistalPartial        Yes      Yes                  Chronic             +---------+---------------+---------+-----------+----------+-------------------+ PFV      Full                                                             +---------+---------------+---------+-----------+----------+-------------------+ POP      Partial        Yes      Yes                  Chronic             +---------+---------------+---------+-----------+----------+-------------------+ PTV      Full  Not well visualized +---------+---------------+---------+-----------+----------+-------------------+ PERO                                                  Not well visualized +---------+---------------+---------+-----------+----------+-------------------+   +---------+---------------+---------+-----------+----------+-------------------+ LEFT     CompressibilityPhasicitySpontaneityPropertiesThrombus Aging      +---------+---------------+---------+-----------+----------+-------------------+ CFV      Full           Yes      Yes                                      +---------+---------------+---------+-----------+----------+-------------------+ SFJ      Full                                                             +---------+---------------+---------+-----------+----------+-------------------+ FV Prox  Partial        Yes      Yes                  Chronic             +---------+---------------+---------+-----------+----------+-------------------+ FV Mid   Partial        Yes      Yes                  Chronic             +---------+---------------+---------+-----------+----------+-------------------+ FV DistalNone           No       No                   Chronic              +---------+---------------+---------+-----------+----------+-------------------+ PFV      Full                                                             +---------+---------------+---------+-----------+----------+-------------------+ POP      None           No       No                   Chronic             +---------+---------------+---------+-----------+----------+-------------------+ PTV      None           No       No                   Chronic             +---------+---------------+---------+-----------+----------+-------------------+ PERO                                                  Not well visualized +---------+---------------+---------+-----------+----------+-------------------+     Summary: RIGHT: - Findings  consistent with 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 - Findings appear essentially unchanged compared to previous examination. - No cystic structure found in the popliteal fossa.  LEFT: - Findings consistent with chronic deep vein thrombosis involving the left femoral vein, left popliteal vein, and left posterior tibial veins. - Findings appear essentially unchanged compared to previous examination. - No cystic structure found in the popliteal fossa.  *See table(s) above for measurements and observations. Electronically signed by Harold Barban MD on 05/24/2021 at 12:53:51 AM.    Final     ASSESSMENT AND PLAN: 1.  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  History of bilateral DVT initially diagnosed June 2020 3.  Microcytic anemia -Ferritin 137 on 05/22/2021 4.  COVID-19 infection 5.  Diastolic heart failure 6.  Hypertension 7.  History of GI bleed secondary to gastric  ulcer and history of polyps -Status post EGD 10/31/2020 which showed an oozing gastric ulcer, gastritis, duodenitis 8.  Hyperlipidemia 9.  Anxiety/depression/PTSD 10.  OSA on CPAP 11.  Cardiomegaly 12.  Small hiatal hernia 13.  Right middle lobe pulmonary nodule  Mr. Kelsay appears stable.  He was transition from heparin to Eliquis over the weekend.  It is unclear if he was taking his anticoagulation consistently prior to hospital admission.  We recommend indefinite anticoagulation therapy.  He has microcytic anemia which may be related to a combined iron deficiency and thalassemia variant.  The soluble transferrin receptor was elevated and taken in commendation with peripheral blood smear findings on the day of initial consult, this supports iron deficiency.  Recommend starting him on ferrous sulfate 325 mg twice daily.  Additionally, hemoglobin electrophoresis resulted earlier this morning and is consistent with beta thalassemia minor.  Recommendations: 1.  Continue Eliquis indefinitely. 2.  Begin ferrous sulfate 325 mg p.o. twice daily. 3.  He can follow-up with hematology for ongoing management of his anemia in 4 to 6 weeks versus outpatient follow-up at the [facility] [place] with his primary care provider. 4.  Evaluation for a source of blood loss per his primary provider  No future appointments.    LOS: 5 days   Mikey Bussing, DNP, AGPCNP-BC, AOCNP 05/28/21 Mr. Specht was interviewed and examined.  He appears stable.  He has no complaint.  He has now maintained on apixaban anticoagulation. The peripheral blood smear and elevated soluble transferrin receptor are consistent with a diagnosis of iron deficiency anemia.  The elevated hemoglobin A2 is consistent with a beta thalassemia variant.  We recommend a trial of ferrous sulfate and work-up for a source of blood loss.  I was present for greater than 50% of today's visit.  I performed medical decision making.

## 2021-05-28 NOTE — Care Management Important Message (Signed)
Important Message  Patient Details  Name: Shane [name] MRN: [id number] Date of Birth: 05/30/1946   Medicare Important Message Given:  Yes     Shelda Altes 05/28/2021, 11:03 AM

## 2021-05-29 LAB — T3, FREE: T3, Free: 2.7 pg/mL (ref 2.0–4.4)

## 2021-05-29 MED ORDER — FERROUS SULFATE 325 (65 FE) MG PO TABS: 325.0000 mg | ORAL_TABLET | Freq: Two times a day (BID) | ORAL | Status: AC

## 2021-05-29 NOTE — TOC Progression Note (Addendum)
Transition of Care [facility]) - Progression Note    Patient Details  Name: Shane [name] MRN: [id number] Date of Birth: 10-09-1945  [facility] CM/SW [place], [place] Phone Number: 05/29/2021, 9:54 AM  Clinical Narrative:     CSW received call from patients insurance. Patients peer to peer was approved. Auth# [id number]. PTAR was approved # [id number]. Patient confirmed with CSW that his brother can bring his Cpap from home to SNF if needed. Patient informed CSW that he does not wear it. CSW informed SNF. Star with [facility] confirmed cpap not needed.Star with [facility] confirmed with CSW that they can accept patient today if medically ready for dc. CSW informed MD. CSW will continue to follow and assist with patients dc planning needs.  Expected Discharge Plan: [place] Barriers to Discharge: Continued Medical Work up  Expected Discharge Plan and Services Expected Discharge Plan: [place] In-house Referral: Clinical Social Work     Living arrangements for the past 2 months: Single Family Home Expected Discharge Date: 05/29/21                                     Social Determinants of Health (SDOH) Interventions    Readmission Risk Interventions No flowsheet data found.

## 2021-05-29 NOTE — Progress Notes (Signed)
Report called to [facility], spoke with Kennon Portela.

## 2021-05-29 NOTE — TOC Transition Note (Signed)
Transition of Care [facility]) - CM/SW Discharge Note   Patient Details  Name: Shane [name] MRN: [id number] Date of Birth: 1945/11/19  Transition of Care [facility]) CM/SW Contact:  Milas Gain, [place] Phone Number: 05/29/2021, 1:07 PM   Clinical Narrative:     Patient will DC to: [place] date: 05/29/2021  Family notified: [name]  Transport by: [name]  ?  Per MD patient ready for DC to [facility] . RN, patient, patient's family, and facility notified of DC. Discharge Summary sent to facility. RN given number for report tele# [phone] RM# [id number].DC packet on chart. DNR signed by MD attached to patients DC packet.Ambulance transport requested for patient.  CSW signing off.   Final next level of care: Skilled Nursing Facility Barriers to Discharge: No Barriers Identified   Patient Goals and CMS Choice Patient states their goals for this hospitalization and ongoing recovery are:: SNF CMS Medicare.gov Compare Post Acute Care list provided to:: Patient Choice offered to / list presented to : Patient  Discharge Placement              Patient chooses bed at: [facility] Patient to be transferred to facility by: [place] Name of family member notified: [name] Patient and family notified of of transfer: 05/29/21  Discharge Plan and Services In-house Referral: Clinical Social Work                                   Social Determinants of Health ([place]) Interventions     Readmission Risk Interventions No flowsheet data found.

## 2021-05-29 NOTE — Progress Notes (Signed)
PT Cancellation Note  Patient Details Name: Shane [name] MRN: [id number] DOB: 1946/01/08   Cancelled Treatment:    Reason Eval/Treat Not Completed: Patient declined, no reason specified. Pt to be dc'd to SNF later today.    Shary Decamp [facility] 05/29/2021, 3:22 PM [place] Pager [phone] Office [phone]

## 2021-05-29 NOTE — Discharge Summary (Signed)
Physician Discharge Summary  Shane [name] XAJ:[id number] DOB: 1946-02-02 DOA: 05/21/2021  PCP: Sheela Stack, MD  Admit date: 05/21/2021 Discharge date: 05/29/2021  Admitted From: Home Disposition: SNF   Recommendations for Outpatient Follow-up:  Follow up with PCP in 1-2 weeks Please obtain BMP/CBC in one week. Consider recheck TSH, free T4 in 4-6 weeks. Trial oral iron and monitor iron studies, CBC. Follow-up hemoglobin electrophoresis and soluble transferrin receptor results which are still pending. Follow up with hematology as outpatient. Suggest outpatient neuropsychiatric evaluation for suspected cognitive impairment. Recommend ongoing surveillance of small AAA, unchanged pulmonary nodule (see details below).  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  Recently tested positive for COVID-19 on 11/15 at the [facility] hospital but not treated with meds.  Patient reportedly left [facility] on 9/27 and family trying to get him into an assisted living facility, currently lives alone at home with friend/family intermittently checking on him.  It appears that patient may not have been fully compliant with his Eliquis.  CTA chest showed small segmental pulmonary artery emboli in the lower lobes bilaterally, right heart strain could not be excluded.  Started on IV heparin.  Hematology was consulted and recommended IV heparin x5 days which he has completed and transitioned to [facility] 11/27.  Since patient missed at least 3 days of Eliquis prior to admission that we know off, this would not be considered as Eliquis failure.   Medically optimized  for discharge to SNF pending bed and insurance approval.  TOC on board.  Discharge Diagnoses:  Principal Problem:   Acute pulmonary embolism (HCC) Active Problems:   Microcytic anemia   GERD   PTSD (post-traumatic stress disorder)   Sleep apnea   Essential hypertension   History of DVT (deep vein thrombosis)   Chronic anticoagulation   History of GI bleed   COVID-19 virus infection  Acute pulmonary embolism/bilateral lower extremity chronic DVTs: CTA chest showed small segmental pulmonary artery emboli in the lower lobes bilaterally and right heart strain could not be excluded. 2D echo 11/23: LVEF 70-75%, LV hyperdynamic function, no regional wall motion abnormalities, mild LVH.  RV poorly visualized but grossly normal size and systolic function. Lower extremity venous Dopplers confirm bilateral lower extremity chronic DVTs. - It appears that patient was on Eliquis anticoagulation for history of bilateral DVT initially diagnosed in June 2020 but unclear why he still remains on it this far out, regardless, suspect he was incompletely adherent, thus not a tx failure of eliquis.  - Hematology consultation appreciated. Recommend indefinite anticoagulation therapy.  Also confirmed with Dr. Benay Spice and as per his recommendation, completed 5 days of IV heparin and has been transitioned to Eliquis 11/27. - Follow-up hemoglobin electrophoresis and soluble transferrin receptor results which are still pending.   Recent COVID-19 infection: Reportedly diagnosed on 05/15/2021 at the [facility]. No symptoms pertaining to this currently.  Not hypoxic. No significant elevation of inflammatory markers. - Has completed 10 days of negative pressure isolation.  Discontinued airborne isolation.     Asthma: Stable without exacerbation.  - Continue Advair substituted medicine.   Microcytic hypochromic anemia: Hematology suspected iron deficiency anemia, possible beta thalassemia. May have an element of chronic  blood loss anemia from previous  upper GI bleed. No current bleeding to indicate inpatient work up.  - Trial oral iron per their recommendations, repeat iron studies, CBC after this.  - Follow up with hematology/oncology after discharge.    Chronic diastolic CHF: Clinically euvolemic. - Tx HTN as below.    Essential hypertension - Holding metoprolol due to soft BP and sinus bradycardia into 30's. Even without BB, is HR 60's with 1st deg heart block. Normotensive.   Elevated TSH: T4 wnl.  - Consider recheck at follow up.    Acute kidney injury: Improving. WNL renal U/S. CrCl currently >64ml/min.  - DC metoprolol to avoid hypotension-mediated ATN.  - Avoid nephrotoxins.    History of GI bleed secondary to gastric ulcers: Underwent EGD by Dr. Bryan Lemma 5/3 which revealed oozing gastric ulcers treated with clips, nonobstructive Schatzki's ring, gastritis, duodenitis and single duodenal polyp that was resected and clips placed. - Continue protonix - Continue anticoagulation with close monitoring  - As discussed with pharmacy, no clear indication for him being on aspirin do this is not started.   Hyperlipidemia - Continue statin   Anxiety/depression/PTSD - Continue depakote, cymbalta, trazodone.    Incidental findings noted on CTA chest that will need to be followed closely as outpatient: CT noted ascending aorta measuring up to 4 cm, right middle lobe pulmonary nodule unchanged from prior, small hiatal hernia, concern for pulmonary artery hypertension, and cardiomegaly with coronary artery calcifications. - Recommend ongoing surveillance of small AAA, unchanged pulmonary nodule.    Suspected cognitive impairment: As per patient's brother, no formal diagnosis of dementia and reportedly slow to warm up to people but otherwise coherent. However this is not the case in the hospital. Cannot r/o delirium in this setting.  - Suggest outpatient neuropsychiatric evaluation.   Obesity: Estimated  body mass index is 34.19 kg/m   BPH:  - Continue tamsulosin  Deconditioning, frequent falls: Worsened by recent covid infection. Will attempt short stay at SNF to pursue rehabilitation.  Discharge Instructions  Allergies as of 05/29/2021   No Known Allergies      Medication List     STOP taking these medications    furosemide 20 MG tablet Commonly known as: LASIX   furosemide 40 MG tablet Commonly known as: LASIX   metoprolol tartrate 25 MG tablet Commonly known as: LOPRESSOR   pantoprazole 40 MG tablet Commonly known as: PROTONIX   triamcinolone 55 MCG/ACT Aero nasal inhaler Commonly known as: Nasacort Allergy 24HR       TAKE these medications    acetaminophen 325 MG tablet Commonly known as: TYLENOL Take 650 mg by mouth every 6 (six) hours as needed for pain.   albuterol 108 (90 Base) MCG/ACT inhaler Commonly known as: VENTOLIN HFA INHALE TWO PUFFS EVERY FOUR TO SIX HOURS AS NEEDED FOR COUGH OR WHEEZE. What changed:  how much to take how to take this when to take this reasons to take this additional instructions   alum & mag hydroxide-simeth 400-400-40 MG/5ML suspension Commonly known as: Mylanta Maximum Strength Take 5 mLs by mouth every 6 (six) hours as needed (abdominal pain or indigestion).   apixaban 5 MG Tabs tablet Commonly known as: ELIQUIS Take 1 tablet (5 mg total) by mouth 2 (two) times daily.   divalproex 125 MG DR tablet Commonly known as: DEPAKOTE Take 125 mg by mouth 2 (two) times daily.   docusate sodium 100 MG capsule Commonly known as: COLACE Take 1 capsule (100 mg total) by mouth 2 (two) times daily. What changed:  when to take this reasons to take this   DULoxetine 20 MG capsule Commonly known as: CYMBALTA Take 40 mg by mouth daily.   ferrous sulfate 325 (65 FE) MG tablet Take 1 tablet (325 mg total) by mouth 2 (two) times daily with a meal. What changed: when to take this   fluticasone-salmeterol 500-50 MCG/ACT  Aepb Commonly known as: ADVAIR Inhale 1 puff into the lungs in the morning and at bedtime.   montelukast 10 MG tablet Commonly known as: SINGULAIR TAKE 1 TABLET BY MOUTH EVERYDAY AT BEDTIME What changed: See the new instructions.   omeprazole 20 MG capsule Commonly known as: PRILOSEC Take 20 mg by mouth 2 (two) times daily.   polyethylene glycol 17 g packet Commonly known as: MIRALAX / GLYCOLAX Take 17 g by mouth daily as needed for moderate constipation.   rosuvastatin 10 MG tablet Commonly known as: CRESTOR Take 1 tablet (10 mg total) by mouth daily.   SYSTANE BALANCE OP Place 1 drop into both eyes daily as needed (for dry eyes).   tamsulosin 0.4 MG Caps capsule Commonly known as: FLOMAX Take 0.4 mg by mouth daily.   traZODone 50 MG tablet Commonly known as: DESYREL Take 50 mg by mouth at bedtime.        Follow-up Information     Sheela Stack, MD. Schedule an appointment as soon as possible for a visit.   Specialty: Family Medicine        Minus Breeding, MD .   Specialty: Cardiology Contact information: [address] [place] [address] [phone]                No Known Allergies  Consultations: Cardiology  Procedures/Studies: DG Chest 2 View  Result Date: 05/21/2021 CLINICAL DATA:  Chest pain. EXAM: CHEST - 2 VIEW COMPARISON:  Chest radiograph dated 10/26/2020. FINDINGS: Minimal bibasilar atelectasis. Right paratracheal density may represent atelectasis related to shallow inspiration or mediastinal vasculature. Developing infiltrate is not excluded. Follow-up recommended. No pleural effusion, pneumothorax. Stable cardiomegaly. Degenerative changes of the spine. No acute osseous pathology. IMPRESSION: Right apical/paratracheal density may represent atelectasis or infiltrate. Continued follow-up recommended. Electronically Signed   By: Anner Crete M.D.   On: 05/21/2021 21:52   CT Angio Chest Pulmonary Embolism (PE) W or WO  Contrast  Addendum Date: 05/22/2021   ADDENDUM REPORT: 05/22/2021 02:13 ADDENDUM: Critical findings were reported to Dr. Waverly Ferrari at 2:11 a.m. Electronically Signed   By: Brett Fairy M.D.   On: 05/22/2021 02:13   Result Date: 05/22/2021 CLINICAL DATA:  Chest and back pain. PE suspected, high probability. EXAM: CT ANGIOGRAPHY CHEST WITH CONTRAST TECHNIQUE: Multidetector CT imaging of the chest was performed using the standard protocol during bolus administration of intravenous contrast. Multiplanar CT image reconstructions and MIPs were obtained to evaluate the vascular anatomy. CONTRAST:  55mL OMNIPAQUE IOHEXOL 350 MG/ML SOLN COMPARISON:  Nov 22, 2020. FINDINGS: Cardiovascular: The heart is normal in size and there is no pericardial effusion. A few scattered coronary artery calcifications are noted. There is mild dilatation of the ascending aorta measuring up to 4.0 cm in diameter. The pulmonary trunk is distended which may be associated with underlying pulmonary artery hypertension. A few tiny subsegmental pulmonary artery filling defects are noted in the lower lobes bilaterally. The right ventricle is mildly distended which is similar in appearance to the prior exam. Mediastinum/Nodes: No enlarged mediastinal, hilar, or axillary lymph nodes. Thyroid gland, trachea, and esophagus demonstrate no significant findings. Lungs/Pleura: Mild atelectasis  is noted bilaterally. There is a 3 mm ground-glass nodule in the right middle lobe, axial image 77. No effusion or pneumothorax. Upper Abdomen: There is a small hiatal hernia. A few scattered calcifications are present in the pancreas which may be related to chronic pancreatitis. There is thickening of the left adrenal gland without evidence of discrete nodule. The right adrenal gland is within normal limits. Musculoskeletal: Gynecomastia is noted bilaterally. Degenerative changes are present in the thoracic spine. Review of the MIP images confirms the above  findings. IMPRESSION: 1. Small segmental pulmonary artery emboli defects in the lower lobes bilaterally. The right ventricle is distended which is similar in appearance to the prior exam and may be chronic, however right heart strain can not be excluded. 2. Distended pulmonary trunk which may be associated with underlying pulmonary artery hypertension. 3. Cardiomegaly with coronary artery calcifications. 4. Mild dilatation of the ascending aorta measuring up to 4.0 cm. Recommend annual imaging followup by CTA or MRA. This recommendation follows 2010 ACCF/AHA/AATS/ACR/ASA/SCA/SCAI/SIR/STS/SVM Guidelines for the Diagnosis and Management of Patients with Thoracic Aortic Disease. Circulation. 2010; 121: O242-P536. Aortic aneurysm NOS (ICD10-I71.9). 5. Small hiatal hernia. 6. Right middle lobe pulmonary nodule, unchanged from the prior exam. Electronically Signed: By: Brett Fairy M.D. On: 05/22/2021 02:05   US RENAL  Result Date: 05/25/2021 CLINICAL DATA:  Acute kidney injury. EXAM: RENAL / URINARY TRACT ULTRASOUND COMPLETE COMPARISON:  CT renal stone 06/10/2019 FINDINGS: Right Kidney: Renal measurements: 10.8 x 4.0 x 3.3 cm = volume: 75 mL. Echogenicity within normal limits. No mass or hydronephrosis visualized. Left Kidney: Renal measurements: 10.2 x 0.9 x 5.4 cm = volume: 142 mL. Echogenicity within normal limits. No mass or hydronephrosis visualized. Bladder: Mild bladder wall thickening. Other: None. IMPRESSION: 1. Kidneys appear within normal limits. 2. Mild bladder wall thickening.  Correlate for cystitis. Electronically Signed   By: Ronney Asters M.D.   On: 05/25/2021 22:19   ECHOCARDIOGRAM COMPLETE  Result Date: 05/23/2021    ECHOCARDIOGRAM REPORT   Patient Name:   MARKON JARES Date of Exam: 05/23/2021 Medical Rec #:  [id number]          Height:       74.0 in Accession #:    [id number]         Weight:       248.1 lb Date of Birth:  06-12-1946          BSA:          2.382 m Patient Age:    27  years           BP:           108/74 mmHg Patient Gender: M                  HR:           72 bpm. Exam Location:  Inpatient Procedure: 2D Echo, Cardiac Doppler, Color Doppler and Intracardiac            Opacification Agent Indications:    Pulmonary Embolus I26.09  History:        Patient has prior history of Echocardiogram examinations, most                 recent 10/27/2020. Risk Factors:Sleep Apnea and Current Smoker.                 Bilarteral DVTS. COVID infection. Shortness of breath. Left  sided chest pain. GERD.  Sonographer:    Darlina Sicilian RDCS Referring Phys: [phone] ANAND D HONGALGI  Sonographer Comments: Image acquisition challenging due to respiratory motion and Restricted mobility due to chest pain. IMPRESSIONS  1. Techincally difficult study with limited views. Left ventricular ejection fraction, by estimation, is 70 to 75%. The left ventricle has hyperdynamic function. The left ventricle has no regional wall motion abnormalities. There is mild left ventricular hypertrophy. Left ventricular diastolic parameters were normal.  2. Right ventricule is poorly visualized but grossly normal size and systolic function  3. The mitral valve is normal in structure. No evidence of mitral valve regurgitation.  4. The aortic valve is tricuspid. Aortic valve regurgitation is not visualized. No aortic stenosis is present. FINDINGS  Left Ventricle: Left ventricular ejection fraction, by estimation, is 70 to 75%. The left ventricle has hyperdynamic function. The left ventricle has no regional wall motion abnormalities. Definity contrast agent was given IV to delineate the left ventricular endocardial borders. The left ventricular internal cavity size was normal in size. There is mild left ventricular hypertrophy. Left ventricular diastolic parameters were normal. Right Ventricle: The right ventricular size is not well visualized. Right vetricular wall thickness was not well visualized. Right ventricular  systolic function was not well visualized. Left Atrium: Left atrial size was normal in size. Right Atrium: Right atrial size was not well visualized. Pericardium: There is no evidence of pericardial effusion. Mitral Valve: The mitral valve is normal in structure. No evidence of mitral valve regurgitation. Tricuspid Valve: The tricuspid valve is normal in structure. Tricuspid valve regurgitation is trivial. Aortic Valve: The aortic valve is tricuspid. Aortic valve regurgitation is not visualized. No aortic stenosis is present. Pulmonic Valve: The pulmonic valve was not well visualized. Pulmonic valve regurgitation is trivial. Aorta: The aortic root is normal in size and structure. IAS/Shunts: The interatrial septum was not well visualized.  LEFT VENTRICLE PLAX 2D LVIDd:         4.00 cm   Diastology LVIDs:         2.80 cm   LV e' medial:    9.32 cm/s LV PW:         1.00 cm   LV E/e' medial:  7.9 LV IVS:        1.20 cm   LV e' lateral:   13.70 cm/s LVOT diam:     2.20 cm   LV E/e' lateral: 5.4 LVOT Area:     3.80 cm  RIGHT VENTRICLE RV S prime:     20.70 cm/s TAPSE (M-mode): 2.1 cm LEFT ATRIUM             Index LA Vol (A2C):   49.4 ml 20.74 ml/m LA Vol (A4C):   25.6 ml 10.75 ml/m LA Biplane Vol: 36.9 ml 15.49 ml/m   AORTA Ao Root diam: 3.60 cm MITRAL VALVE MV Area (PHT): 2.94 cm     SHUNTS MV Decel Time: 258 msec     Systemic Diam: 2.20 cm MV E velocity: 73.30 cm/s MV A velocity: 110.00 cm/s MV E/A ratio:  0.67 Oswaldo Milian MD Electronically signed by Oswaldo Milian MD Signature Date/Time: 05/23/2021/4:18:40 PM    Final    VAS [place] LOWER EXTREMITY VENOUS (DVT)  Result Date: 05/24/2021  Lower Venous DVT Study Patient Name:  ALARIC GLADWIN  Date of Exam:   05/23/2021 Medical Rec #: [id number]           Accession #:    [id number] Date of Birth:  [birth year]           Patient Gender: M Patient Age:   51 years Exam Location:  [facility] Procedure:      VAS [place] LOWER EXTREMITY VENOUS (DVT)  Referring Phys: ANAND HONGALGI --------------------------------------------------------------------------------  Indications: Pulmonary embolism.  Anticoagulation: Heparin. Comparison Study: 10-30-2020 Most recent prior lower extremity venous was                   positive for chronic DVT bilaterally. Performing Technologist: Rogelia Rohrer RVT, RDMS  Examination Guidelines: A complete evaluation includes B-mode imaging, spectral Doppler, color Doppler, and power Doppler as needed of all accessible portions of each vessel. Bilateral testing is considered an integral part of a complete examination. Limited examinations for reoccurring indications may be performed as noted. The reflux portion of the exam is performed with the patient in reverse Trendelenburg.  +---------+---------------+---------+-----------+----------+-------------------+ RIGHT    CompressibilityPhasicitySpontaneityPropertiesThrombus Aging      +---------+---------------+---------+-----------+----------+-------------------+ CFV      Full           Yes      Yes                                      +---------+---------------+---------+-----------+----------+-------------------+ SFJ      Full                                                             +---------+---------------+---------+-----------+----------+-------------------+ FV Prox  Partial        Yes      Yes                  Chronic             +---------+---------------+---------+-----------+----------+-------------------+ FV Mid   Partial        Yes      Yes                  Chronic             +---------+---------------+---------+-----------+----------+-------------------+ FV DistalPartial        Yes      Yes                  Chronic             +---------+---------------+---------+-----------+----------+-------------------+ PFV      Full                                                              +---------+---------------+---------+-----------+----------+-------------------+ POP      Partial        Yes      Yes                  Chronic             +---------+---------------+---------+-----------+----------+-------------------+ PTV      Full  Not well visualized +---------+---------------+---------+-----------+----------+-------------------+ PERO                                                  Not well visualized +---------+---------------+---------+-----------+----------+-------------------+   +---------+---------------+---------+-----------+----------+-------------------+ LEFT     CompressibilityPhasicitySpontaneityPropertiesThrombus Aging      +---------+---------------+---------+-----------+----------+-------------------+ CFV      Full           Yes      Yes                                      +---------+---------------+---------+-----------+----------+-------------------+ SFJ      Full                                                             +---------+---------------+---------+-----------+----------+-------------------+ FV Prox  Partial        Yes      Yes                  Chronic             +---------+---------------+---------+-----------+----------+-------------------+ FV Mid   Partial        Yes      Yes                  Chronic             +---------+---------------+---------+-----------+----------+-------------------+ FV DistalNone           No       No                   Chronic             +---------+---------------+---------+-----------+----------+-------------------+ PFV      Full                                                             +---------+---------------+---------+-----------+----------+-------------------+ POP      None           No       No                   Chronic             +---------+---------------+---------+-----------+----------+-------------------+  PTV      None           No       No                   Chronic             +---------+---------------+---------+-----------+----------+-------------------+ PERO                                                  Not well visualized +---------+---------------+---------+-----------+----------+-------------------+     Summary: RIGHT: - Findings  consistent with 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 - Findings appear essentially unchanged compared to previous examination. - No cystic structure found in the popliteal fossa.  LEFT: - Findings consistent with chronic deep vein thrombosis involving the left femoral vein, left popliteal vein, and left posterior tibial veins. - Findings appear essentially unchanged compared to previous examination. - No cystic structure found in the popliteal fossa.  *See table(s) above for measurements and observations. Electronically signed by Harold Barban MD on 05/24/2021 at 12:53:51 AM.    Final     Subjective: Feels well, more alert today, and interactive. Has chronic left knee pain that is not bothering him currently. No dyspnea or other complaints this morning.   Discharge Exam: Vitals:   05/29/21 0408 05/29/21 0854  BP: 126/73 125/85  Pulse: 66 62  Resp: 14 14  Temp: 98.4 F (36.9 C) 98.1 F (36.7 C)  SpO2: 95% 96%   General: Pt is alert, awake, not in acute distress Cardiovascular: RRR, borderline bradycardia, S1/S2 +, no rubs, no gallops Respiratory: CTA bilaterally, no wheezing, no rhonchi Abdominal: Soft, NT, ND, bowel sounds + Extremities Trace pitting stable dependent woody edema, no cyanosis  Labs: BNP (last 3 results) Recent Labs    11/02/20 0145 05/21/21 2034 05/22/21 1311  BNP 243.0* 143.0* 332.9*   Basic Metabolic Panel: Recent Labs  Lab 05/23/21 0735 05/24/21 0150 05/25/21 0229 05/26/21 0238 05/28/21 0235  NA 135 136 138 137 139  K 4.0 3.8 3.8  3.7 3.7  CL 101 103 106 106 109  CO2 24 25 26 25 24   GLUCOSE 86 95 95 111* 144*  BUN 11 19 19 13 9   CREATININE 1.44* 1.63* 1.66* 1.42* 1.23  CALCIUM 8.9 8.5* 8.5* 8.7* 8.7*   Liver Function Tests: No results for input(s): AST, ALT, ALKPHOS, BILITOT, PROT, ALBUMIN in the last 168 hours. No results for input(s): LIPASE, AMYLASE in the last 168 hours. No results for input(s): AMMONIA in the last 168 hours. CBC: Recent Labs  Lab 05/23/21 0735 05/24/21 0150 05/25/21 0229 05/26/21 0238 05/27/21 0222  WBC 6.4 8.1 6.3 6.4 6.8  HGB 11.5* 10.9* 10.3* 10.4* 10.6*  HCT 36.0* 34.6* 32.7* 34.0* 34.3*  MCV 63.3* 62.6* 63.2* 63.2* 64.0*  PLT 207 193 197 195 189   Cardiac Enzymes: No results for input(s): CKTOTAL, CKMB, CKMBINDEX, TROPONINI in the last 168 hours. BNP: Invalid input(s): POCBNP CBG: No results for input(s): GLUCAP in the last 168 hours. D-Dimer No results for input(s): DDIMER in the last 72 hours. Hgb A1c No results for input(s): HGBA1C in the last 72 hours. Lipid Profile No results for input(s): CHOL, HDL, LDLCALC, TRIG, CHOLHDL, LDLDIRECT in the last 72 hours. Thyroid function studies Recent Labs    05/28/21 0235  T3FREE 2.7   Anemia work up No results for input(s): VITAMINB12, FOLATE, FERRITIN, TIBC, IRON, RETICCTPCT in the last 72 hours. Urinalysis    Component Value Date/Time   COLORURINE YELLOW 05/21/2021 2017   APPEARANCEUR HAZY (A) 05/21/2021 2017   LABSPEC 1.011 05/21/2021 2017   PHURINE 7.0 05/21/2021 2017   GLUCOSEU NEGATIVE 05/21/2021 2017   HGBUR NEGATIVE 05/21/2021 2017   BILIRUBINUR NEGATIVE 05/21/2021 2017   KETONESUR NEGATIVE 05/21/2021 2017   PROTEINUR NEGATIVE 05/21/2021 2017   NITRITE NEGATIVE 05/21/2021 2017   LEUKOCYTESUR TRACE (A) 05/21/2021 2017    Microbiology Recent Results (from the past 240 hour(s))  Resp Panel by RT-PCR (Flu A&B, Covid)  Urine, Clean Catch     Status: Abnormal   Collection Time: 05/21/21  8:18 PM   Specimen:  Urine, Clean Catch; Nasopharyngeal(NP) swabs in vial transport medium  Result Value Ref Range Status   SARS Coronavirus 2 by RT PCR POSITIVE (A) NEGATIVE Final    Comment: RESULT CALLED TO, READ BACK BY AND VERIFIED WITH: RN BOBBY S. 05/21/21@22 :16 BY TW (NOTE) SARS-CoV-2 target nucleic acids are DETECTED.  The SARS-CoV-2 RNA is generally detectable in upper respiratory specimens during the acute phase of infection. Positive results are indicative of the presence of the identified virus, but do not rule out bacterial infection or co-infection with other pathogens not detected by the test. Clinical correlation with patient history and other diagnostic information is necessary to determine patient infection status. The expected result is Negative.  Fact Sheet for Patients: EntrepreneurPulse.com.au  Fact Sheet for Healthcare Providers: IncredibleEmployment.be  This test is not yet approved or cleared by the [place] FDA and  has been authorized for detection and/or diagnosis of SARS-CoV-2 by FDA under an Emergency Use Authorization (EUA).  This EUA will remain in effect (meaning this test can be  used) for the duration of  the COVID-19 declaration under Section 564(b)(1) of the Act, 21 U.S.C. section 360bbb-3(b)(1), unless the authorization is terminated or revoked sooner.     Influenza A by PCR NEGATIVE NEGATIVE Final   Influenza B by PCR NEGATIVE NEGATIVE Final    Comment: (NOTE) The Xpert Xpress SARS-CoV-2/FLU/RSV plus assay is intended as an aid in the diagnosis of influenza from Nasopharyngeal swab specimens and should not be used as a sole basis for treatment. Nasal washings and aspirates are unacceptable for Xpert Xpress SARS-CoV-2/FLU/RSV testing.  Fact Sheet for Patients: EntrepreneurPulse.com.au  Fact Sheet for Healthcare Providers: IncredibleEmployment.be  This test is not yet approved or  cleared by the [place] FDA and has been authorized for detection and/or diagnosis of SARS-CoV-2 by FDA under an Emergency Use Authorization (EUA). This EUA will remain in effect (meaning this test can be used) for the duration of the COVID-19 declaration under Section 564(b)(1) of the Act, 21 U.S.C. section 360bbb-3(b)(1), unless the authorization is terminated or revoked.  Performed at [facility], [place] [address] [place], [place] [address]     Time coordinating discharge: Approximately 40 minutes  Patrecia Pour, MD  [facility] 05/29/2021, 10:34 AM

## 2021-05-30 DIAGNOSIS — I2699 Other pulmonary embolism without acute cor pulmonale: Secondary | ICD-10-CM | POA: Diagnosis not present

## 2021-05-30 DIAGNOSIS — Z7901 Long term (current) use of anticoagulants: Secondary | ICD-10-CM | POA: Diagnosis not present

## 2021-05-30 DIAGNOSIS — M542 Cervicalgia: Secondary | ICD-10-CM | POA: Diagnosis not present

## 2021-05-30 DIAGNOSIS — E871 Hypo-osmolality and hyponatremia: Secondary | ICD-10-CM | POA: Diagnosis not present

## 2021-05-30 DIAGNOSIS — G4733 Obstructive sleep apnea (adult) (pediatric): Secondary | ICD-10-CM | POA: Diagnosis not present

## 2021-05-30 DIAGNOSIS — I82509 Chronic embolism and thrombosis of unspecified deep veins of unspecified lower extremity: Secondary | ICD-10-CM | POA: Diagnosis not present

## 2021-05-30 DIAGNOSIS — M6281 Muscle weakness (generalized): Secondary | ICD-10-CM | POA: Diagnosis not present

## 2021-05-30 DIAGNOSIS — D6859 Other primary thrombophilia: Secondary | ICD-10-CM | POA: Diagnosis not present

## 2021-05-30 DIAGNOSIS — I10 Essential (primary) hypertension: Secondary | ICD-10-CM | POA: Diagnosis not present

## 2021-05-30 DIAGNOSIS — R2689 Other abnormalities of gait and mobility: Secondary | ICD-10-CM | POA: Diagnosis not present

## 2021-05-30 DIAGNOSIS — R262 Difficulty in walking, not elsewhere classified: Secondary | ICD-10-CM | POA: Diagnosis not present

## 2021-05-30 DIAGNOSIS — R41841 Cognitive communication deficit: Secondary | ICD-10-CM | POA: Diagnosis not present

## 2021-05-30 DIAGNOSIS — R1311 Dysphagia, oral phase: Secondary | ICD-10-CM | POA: Diagnosis not present

## 2021-05-30 DIAGNOSIS — K449 Diaphragmatic hernia without obstruction or gangrene: Secondary | ICD-10-CM | POA: Diagnosis not present

## 2021-05-30 DIAGNOSIS — R5381 Other malaise: Secondary | ICD-10-CM | POA: Diagnosis not present

## 2021-05-30 DIAGNOSIS — M25562 Pain in left knee: Secondary | ICD-10-CM | POA: Diagnosis not present

## 2021-05-30 DIAGNOSIS — R531 Weakness: Secondary | ICD-10-CM | POA: Diagnosis not present

## 2021-05-30 DIAGNOSIS — D509 Iron deficiency anemia, unspecified: Secondary | ICD-10-CM | POA: Diagnosis not present

## 2021-05-30 DIAGNOSIS — M6258 Muscle wasting and atrophy, not elsewhere classified, other site: Secondary | ICD-10-CM | POA: Diagnosis not present

## 2021-05-30 DIAGNOSIS — U071 COVID-19: Secondary | ICD-10-CM | POA: Diagnosis not present

## 2021-05-30 DIAGNOSIS — Z7401 Bed confinement status: Secondary | ICD-10-CM | POA: Diagnosis not present

## 2021-05-31 DIAGNOSIS — I2699 Other pulmonary embolism without acute cor pulmonale: Secondary | ICD-10-CM | POA: Diagnosis not present

## 2021-05-31 DIAGNOSIS — U071 COVID-19: Secondary | ICD-10-CM | POA: Diagnosis not present

## 2021-05-31 DIAGNOSIS — I82509 Chronic embolism and thrombosis of unspecified deep veins of unspecified lower extremity: Secondary | ICD-10-CM | POA: Diagnosis not present

## 2021-05-31 DIAGNOSIS — D6859 Other primary thrombophilia: Secondary | ICD-10-CM | POA: Diagnosis not present

## 2021-06-01 ENCOUNTER — Telehealth: Payer: Self-pay | Admitting: *Deleted

## 2021-06-01 NOTE — Telephone Encounter (Signed)
Left VM for brother, [name] to call back on Monday to inform office if Mr. Grandville Silos wishes to f/u with PCP, Dr. Sheela Stack at [facility] or with Dr. Benay Spice. Needs lab/OV in 4 weeks (CBC) per Dr. Benay Spice.  Also faxed discharge summary to Dr. Theodosia Blender office at [phone]

## 2021-06-05 DIAGNOSIS — R5381 Other malaise: Secondary | ICD-10-CM | POA: Diagnosis not present

## 2021-06-05 DIAGNOSIS — I10 Essential (primary) hypertension: Secondary | ICD-10-CM | POA: Diagnosis not present

## 2021-06-05 DIAGNOSIS — M25562 Pain in left knee: Secondary | ICD-10-CM | POA: Diagnosis not present

## 2021-06-05 DIAGNOSIS — I2699 Other pulmonary embolism without acute cor pulmonale: Secondary | ICD-10-CM | POA: Diagnosis not present

## 2021-06-06 DIAGNOSIS — K449 Diaphragmatic hernia without obstruction or gangrene: Secondary | ICD-10-CM | POA: Diagnosis not present

## 2021-06-06 DIAGNOSIS — Z7901 Long term (current) use of anticoagulants: Secondary | ICD-10-CM | POA: Diagnosis not present

## 2021-06-07 DIAGNOSIS — M542 Cervicalgia: Secondary | ICD-10-CM | POA: Diagnosis not present

## 2021-06-07 DIAGNOSIS — R5381 Other malaise: Secondary | ICD-10-CM | POA: Diagnosis not present

## 2021-06-07 DIAGNOSIS — D509 Iron deficiency anemia, unspecified: Secondary | ICD-10-CM | POA: Diagnosis not present

## 2021-06-07 DIAGNOSIS — M25562 Pain in left knee: Secondary | ICD-10-CM | POA: Diagnosis not present

## 2021-06-07 DIAGNOSIS — D6859 Other primary thrombophilia: Secondary | ICD-10-CM | POA: Diagnosis not present

## 2021-06-08 DIAGNOSIS — I10 Essential (primary) hypertension: Secondary | ICD-10-CM | POA: Diagnosis not present

## 2021-06-08 DIAGNOSIS — I2699 Other pulmonary embolism without acute cor pulmonale: Secondary | ICD-10-CM | POA: Diagnosis not present

## 2021-06-08 DIAGNOSIS — E871 Hypo-osmolality and hyponatremia: Secondary | ICD-10-CM | POA: Diagnosis not present

## 2021-06-08 DIAGNOSIS — G4733 Obstructive sleep apnea (adult) (pediatric): Secondary | ICD-10-CM | POA: Diagnosis not present

## 2021-06-11 ENCOUNTER — Telehealth: Payer: Self-pay | Admitting: *Deleted

## 2021-06-11 DIAGNOSIS — I10 Essential (primary) hypertension: Secondary | ICD-10-CM | POA: Diagnosis not present

## 2021-06-11 NOTE — Telephone Encounter (Signed)
Spoke w/patient's brother and he reports Shane [name] left [employer] today and has been admitted to long-term care [facility] facility in [place]. He will have his f/u care with the [place]. He appreciated the follow up.

## 2021-08-01 NOTE — Telephone Encounter (Signed)
error 

## 2022-02-08 ENCOUNTER — Other Ambulatory Visit: Payer: Self-pay | Admitting: *Deleted

## 2022-02-08 NOTE — Patient Outreach (Signed)
  Care Coordination   02/08/2022 Name: Shane [name] MRN: [id number] DOB: [birth year]   Care Coordination Outreach Attempts:  An unsuccessful telephone outreach was attempted today to offer the patient information about available care coordination services as a benefit of their health plan.   Follow Up Plan:  Additional outreach attempts will be made to offer the patient care coordination information and services.   Encounter Outcome:  No Answer  Care Coordination Interventions Activated:  No   Care Coordination Interventions:  No, not indicated    Kanoa Phillippi C. Myrtie Neither, MSN, [facility] Gerontological Nurse Practitioner [facility] Care Management [phone]

## 2022-02-14 ENCOUNTER — Other Ambulatory Visit: Payer: Self-pay | Admitting: *Deleted

## 2022-02-14 NOTE — Patient Outreach (Addendum)
  Care Coordination   02/14/2022 Name: Shane [name] MRN: [id number] DOB: Jul 10, 1945   Care Coordination Outreach Attempts:  A second unsuccessful outreach was attempted today to offer the patient with information about available care coordination services as a benefit of their health plan.     Follow Up Plan: PT DOES NOT HAVE A PHONE. MESSAGES HAVE BEEN LEFT ON HIS BROTHERS PHONE X 2. Additional outreach attempts will be made to offer the patient care coordination information and services.   Encounter Outcome:  No Answer  Care Coordination Interventions Activated:  No   Care Coordination Interventions:  No, not indicated    Shane [name] C. Myrtie Neither, MSN, GNP-BC Gerontological Nurse Practitioner [facility] Care Management [phone]  ADDENDUM: PT BROTHER, Shane [name] RETURNED CALL AND ADVISED THAT HIS BROTHER IS RESIDING IN THE [facility].NO FURTHER OUTREACHES NECESSARY.  [name], MSN, [facility] Gerontological Nurse Practitioner [facility] Care Management [phone]

## 2022-06-03 IMAGING — CT CT ANGIO CHEST-ABD-PELV FOR DISSECTION W/ AND WO/W CM
2 of 7 series · 11 of 46 positions shown, 12 images · non-contrast
Comparison: CT abdomen pelvis-06/10/2019; 10/24/2018; 03/07/2017

CLINICAL DATA: Left upper back pain radiating to the flank.
Evaluate for dissection.

EXAM:
CT ANGIOGRAPHY CHEST, ABDOMEN AND PELVIS
TECHNIQUE: Non-contrast CT of the chest was initially obtained.

[Series 6: axial arterial · axial · arterial · 0.87mm/px · z∈[-619,-61]mm · 8 of 214 slices shown, 9 images]
[im 14/214  soft-tissue]
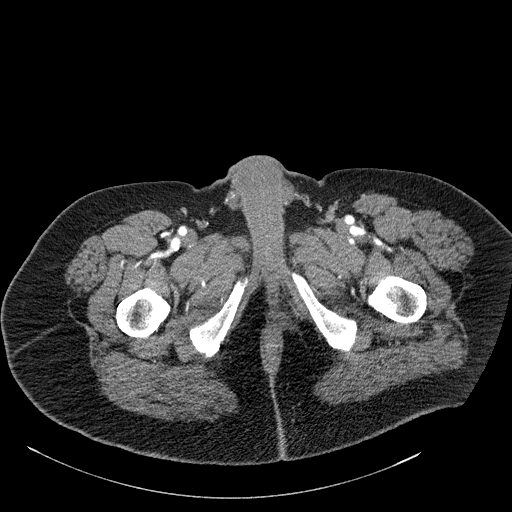
[im 14/214  bone]
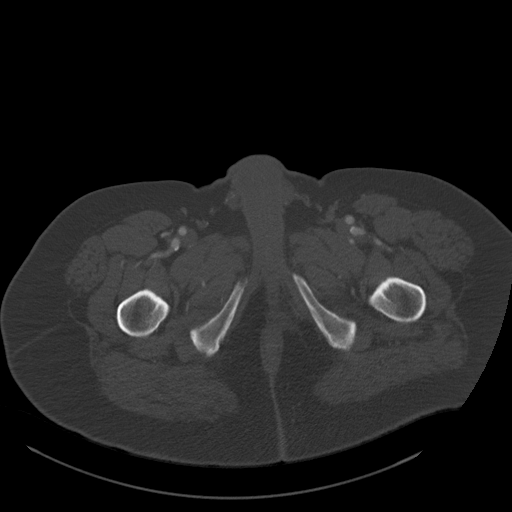
[im 40/214  soft-tissue]
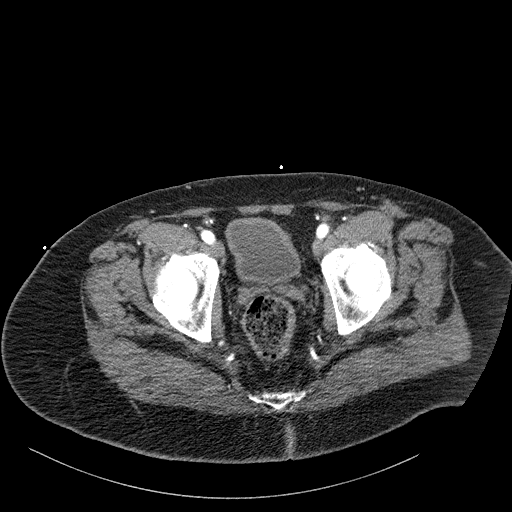
[im 67/214  soft-tissue]
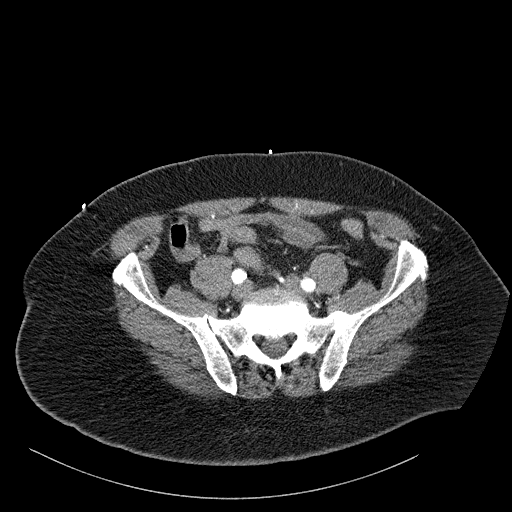
[im 94/214  soft-tissue]
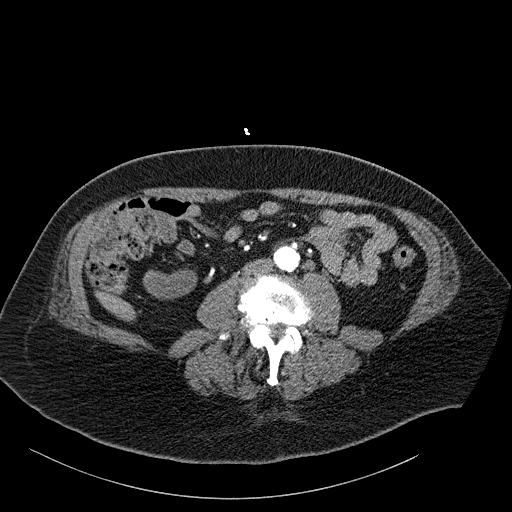
[im 120/214  soft-tissue]
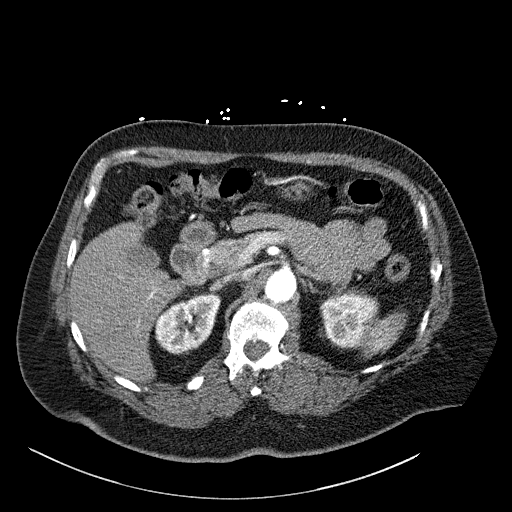
[im 147/214  soft-tissue]
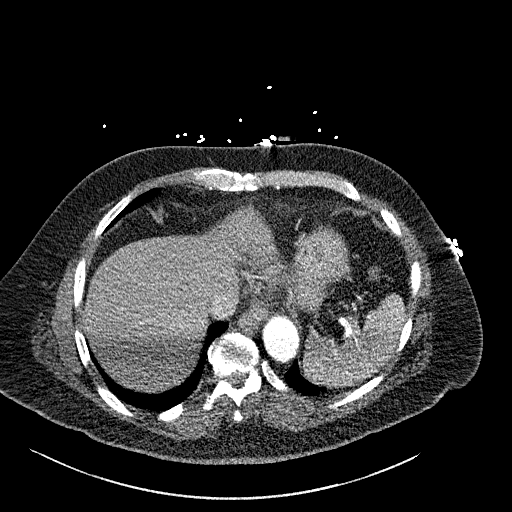
[im 174/214  soft-tissue]
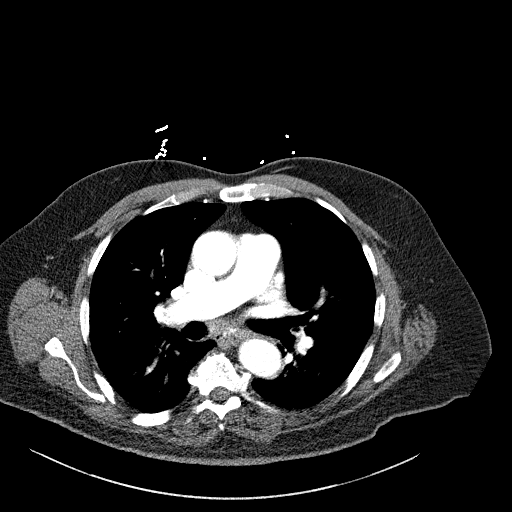
[im 200/214  soft-tissue]
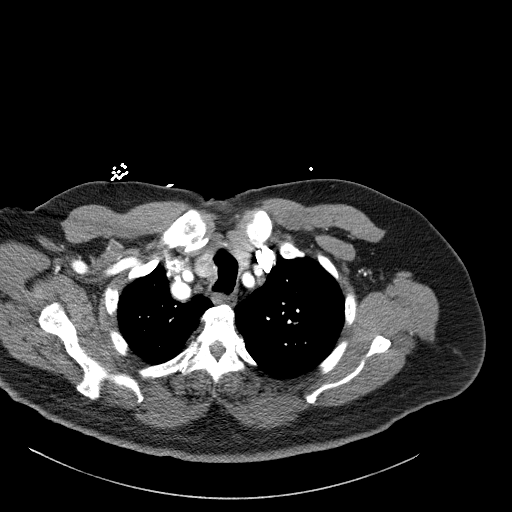

[Series 9: coronals · coronal · 0.87mm/px · 3 of 165 slices shown]
[im 42/165  soft-tissue]
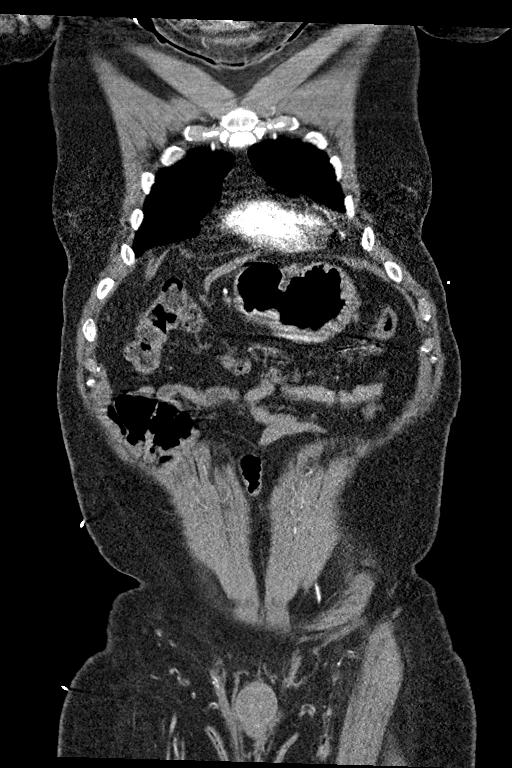
[im 83/165  soft-tissue]
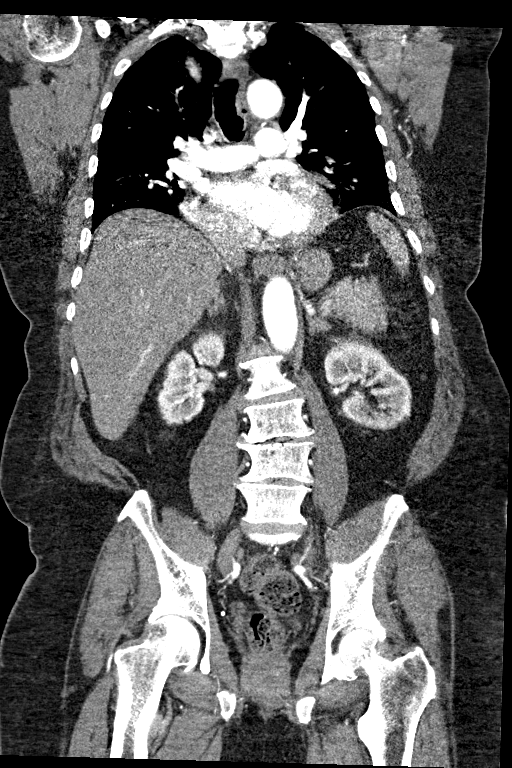
[im 124/165  soft-tissue]
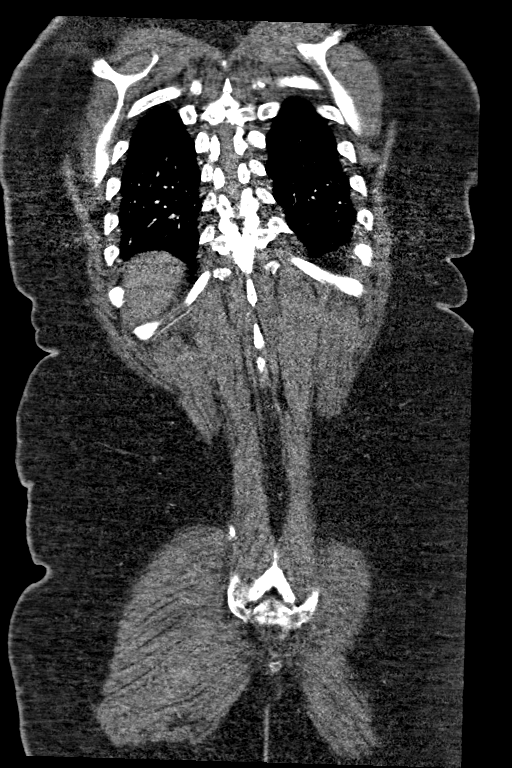

[11 of 46 positions shown; findings below may reference images not displayed]

Multidetector CT imaging through the chest, abdomen and pelvis was
performed using the standard protocol during bolus administration of
intravenous contrast. Multiplanar reconstructed images and MIPs were
obtained and reviewed to evaluate the vascular anatomy.

CONTRAST:  100mL OMNIPAQUE IOHEXOL 350 MG/ML SOLN
FINDINGS: Vascular Findings:

Mild fusiform ectasia of the ascending thoracic aorta with
measurements as follows. No evidence of thoracic aortic dissection
or periaortic stranding on this nongated examination. Review of the
precontrast images is negative for the presence of an intramural
hematoma.

Conventional configuration of the aortic arch. The branch vessels of
the aortic arch appear widely patent throughout their imaged
courses.

Cardiomegaly.  No pericardial effusion.

Although this examination was not tailored for the evaluation the
pulmonary arteries, there are no discrete filling defects within the
central pulmonary arterial tree to suggest central pulmonary
embolism. Normal caliber of the main pulmonary artery.

-------------------------------------------------------------

Thoracic aortic measurements:

Sinotubular junction

32 mm as measured in greatest oblique short axis coronal dimension.

Proximal ascending aorta

39 mm as measured in greatest oblique short axis axial dimension at
the level of the main pulmonary artery and approximately 37 mm in
greatest oblique short axis coronal diameter (coronal image 69,
series 9)

Aortic arch aorta

32 mm as measured in greatest oblique short axis sagittal dimension.

Proximal descending thoracic aorta

32 mm as measured in greatest oblique short axis axial dimension at
the level of the main pulmonary artery.

Distal descending thoracic aorta

30 mm as measured in greatest oblique short axis axial dimension at
the level of the diaphragmatic hiatus.

Review of the MIP images confirms the above findings.

-------------------------------------------------------------

Non-Vascular Findings:

Mediastinum/Lymph Nodes: No bulky mediastinal, hilar or axillary
lymphadenopathy.

Lungs/Pleura: Minimal dependent subpleural ground-glass atelectasis,
worse within the left lower lobe. No discrete focal airspace
opacities. No pleural effusion or pneumothorax. The central
pulmonary airways appear widely patent.

No discrete pulmonary nodules.

Musculoskeletal: No acute or aggressive osseous abnormalities.
Stigmata of dish within the thoracic spine. Normal appearance of the
thyroid gland. Note is made of moderate symmetric bilateral
gynecomastia.

_________________________________________________________

_________________________________________________________

CTA ABDOMEN AND PELVIS FINDINGS

VASCULAR

Aorta: Scattered mixed calcified and noncalcified atherosclerotic
plaque within normal caliber abdominal aorta, not resulting in
hemodynamically significant stenosis. No abdominal aortic dissection
or periaortic stranding.

Celiac: Widely patent without a hemodynamically significant
narrowing. Conventional branching pattern.

SMA: Widely patent without hemodynamically significant narrowing.
Conventional branching pattern.

Renals: Solitary bilaterally; there is a minimal amount of mixed
calcified and noncalcified atherosclerotic plaque involving the
origin of the bilateral renal arteries, not resulting in a
hemodynamically significant stenosis. No vessel irregularity to
suggest FMD.

IMA: Widely patent without hemodynamically significant narrowing.

Inflow: There is a minimal to moderate amount of mixed calcified and
noncalcified atherosclerotic plaque involving the bilateral common
iliac arteries, left greater than right, not resulting in a
hemodynamically significant stenosis. The bilateral internal and
external iliac arteries are of normal caliber and widely patent
without hemodynamically significant narrowing. Minimal amount of
atherosclerotic plaque involving the bilateral common femoral
arteries, not resulting in hemodynamically significant stenosis. The
imaged portions of the bilateral deep and superficial arteries are
widely patent without a hemodynamically significant narrowing.

Veins: The IVC and pelvic venous systems appear widely patent on
this arterial phase examination.

Review of the MIP images confirms the above findings.

_________________________________________________________

NON-VASCULAR

Evaluation of abdominal organs is limited to the arterial phase of
enhancement.

Hepatobiliary: Normal hepatic contour. No discrete hyperenhancing
hepatic lesions. Normal appearance of the gallbladder given degree
distention. No radiopaque gallstones. No intra or extrahepatic
biliary ductal dilatation. No ascites.

Pancreas: Normal appearance of the pancreas.

Spleen: Normal appearance of the spleen.

Adrenals/Urinary Tract: There is symmetric enhancement of the
bilateral kidneys. No definite renal stones on this postcontrast
examination. No discrete renal lesions. There is a minimal amount of
slightly asymmetric left-sided perinephric stranding without
evidence of urinary obstruction. Mild thickening of the left adrenal
gland without discrete nodule. Normal appearance of the right
adrenal gland. Mild thickening the urinary bladder wall, potentially
accentuated due to underdistention.

Stomach/Bowel: Large colonic stool burden without evidence of
enteric obstruction. Normal appearance of the terminal ileum and the
appendix. No pneumoperitoneum, pneumatosis or portal venous gas.

Lymphatic: Scattered retroperitoneal lymph nodes are not enlarged by
size criteria with index left-sided periaortic lymph node measuring
0.9 cm in greatest short axis diameter (image 114, series 6,
presumably reactive in etiology. No bulky retroperitoneal,
mesenteric, pelvic or inguinal lymph adenopathy.

Reproductive: Dystrophic calcifications within normal sized prostate
gland. No free fluid the pelvic cul-de-sac.

Other: Small mesenteric fat containing left-sided indirect inguinal
hernia. Tiny mesenteric fat containing periumbilical hernia. There
is a minimal amount of subcutaneous edema about the midline of the
low back.

Musculoskeletal: No acute or aggressive osseous abnormalities. Mild
to moderate multilevel lumbar spine DDD, worse at L3-L4 with disc
space height loss, endplate irregularity and small posteriorly
directed disc osteophyte complex at this location. Mild scoliotic
curvature of the thoracolumbar spine.

Review of the MIP images confirms the above findings.
IMPRESSION: 1. Minimal amount of asymmetric left-sided perinephric stranding
without evidence of urinary obstruction, nonspecific though could be
seen in the setting of an early infection/pyelonephritis.
Correlation with urinalysis is advised.
2. Otherwise, no explanation for patient's left upper back/flank
pain.
3. No acute cardiopulmonary disease. Specifically, no evidence of
thoracic aortic dissection, intramural hematoma or central pulmonary
embolism.
4. Mild fusiform ectasia of the ascending thoracic aorta measuring
39 mm in diameter.
5. Cardiomegaly.
6. Scattered atherosclerotic plaque within normal caliber abdominal
aorta. Aortic Atherosclerosis ([id number]).
7. Mild-to-moderate multilevel lumbar spine DDD, worse at L3-L4.

## 2022-08-02 ENCOUNTER — Other Ambulatory Visit: Payer: Self-pay

## 2022-11-26 IMAGING — CT CT ANGIO CHEST
2 of 7 series · 18 of 46 positions shown · IV contrast (APPLIED)
Comparison: 12/25/2019

CLINICAL DATA: Left chest and shoulder pain beginning yesterday.
Personal history of DVT. High probability for pulmonary embolism.

EXAM:
CT ANGIOGRAPHY CHEST WITH CONTRAST
TECHNIQUE: Multidetector CT imaging of the chest was performed using the
standard protocol during bolus administration of intravenous
contrast. Multiplanar CT image reconstructions and MIPs were
obtained to evaluate the vascular anatomy.
CONTRAST:  75mL OMNIPAQUE IOHEXOL 350 MG/ML SOLN

[Series 6: thins · axial · 0.80mm/px · z∈[+1405,+1675]mm · 15 of 432 slices shown]
[im 23/432  lung]
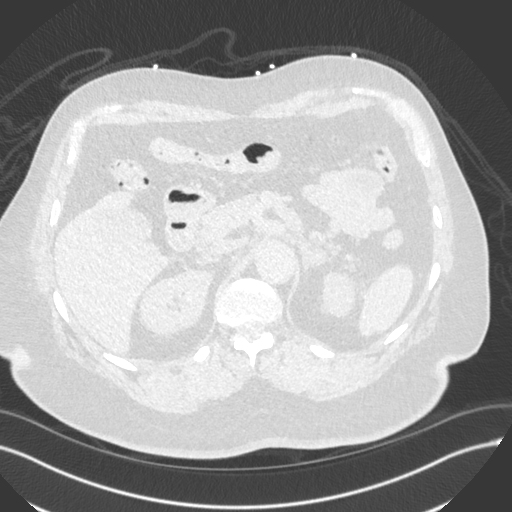
[im 46/432  soft-tissue]
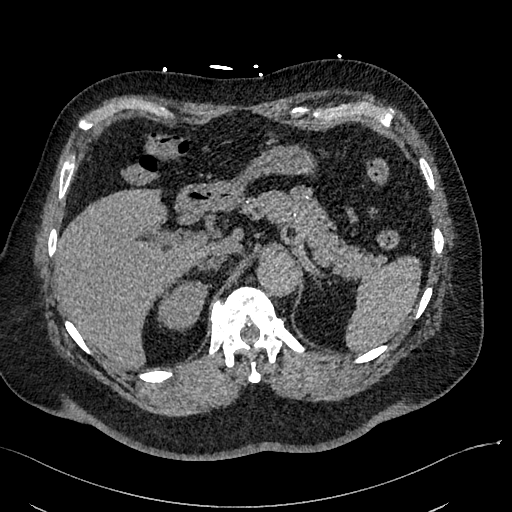
[im 91/432  lung]
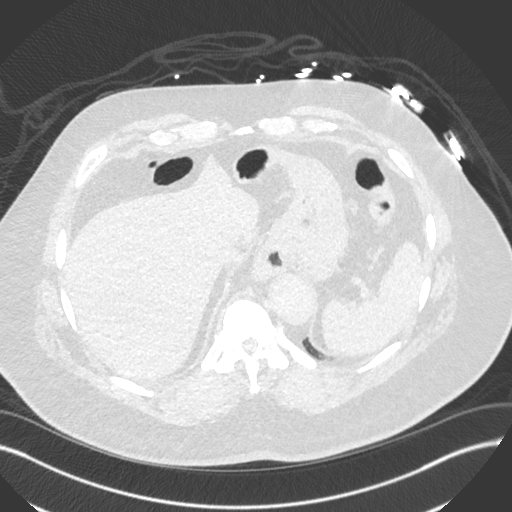
[im 114/432  soft-tissue]
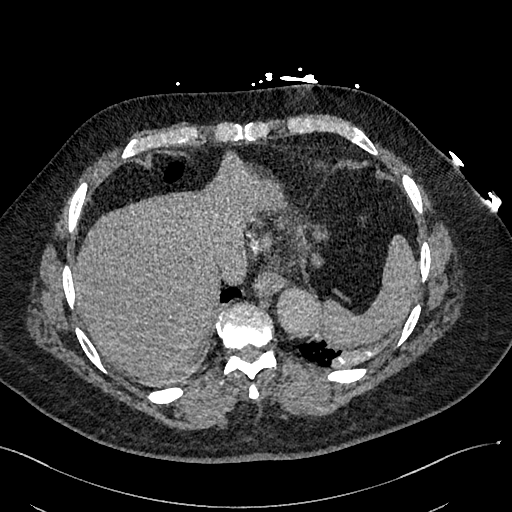
[im 137/432  lung]
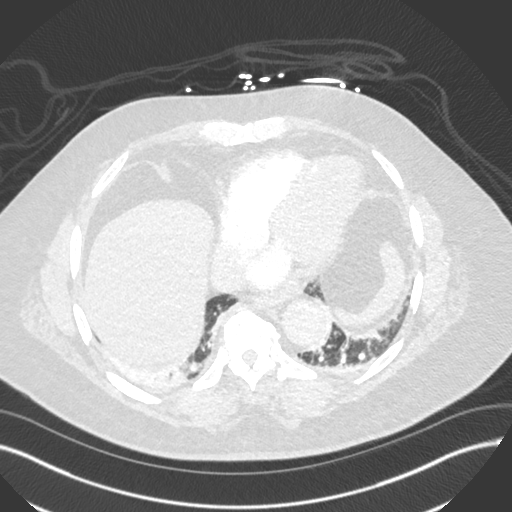
[im 159/432  soft-tissue]
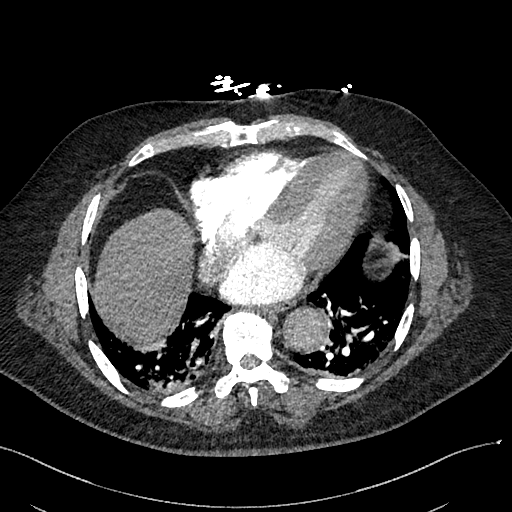
[im 182/432  lung]
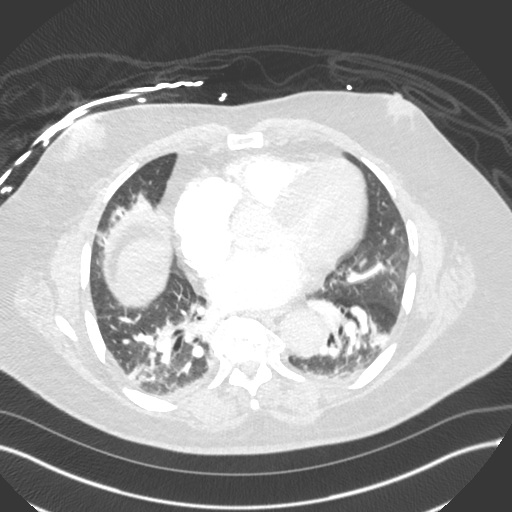
[im 227/432  soft-tissue]
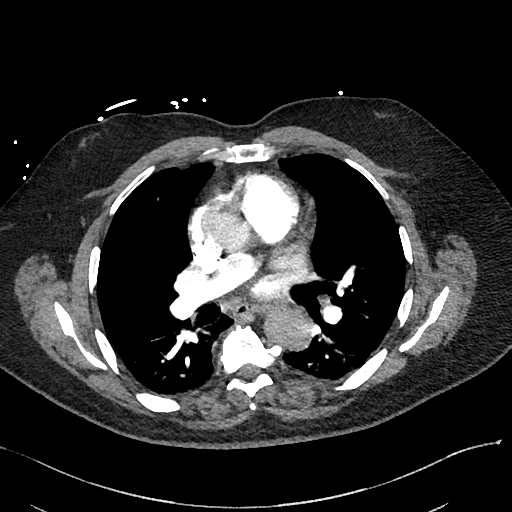
[im 250/432  lung]
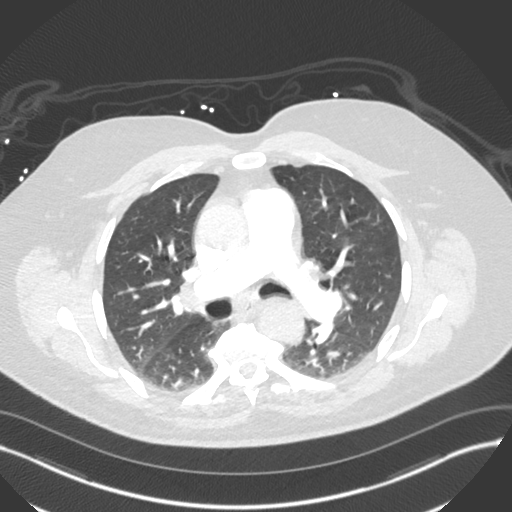
[im 273/432  soft-tissue]
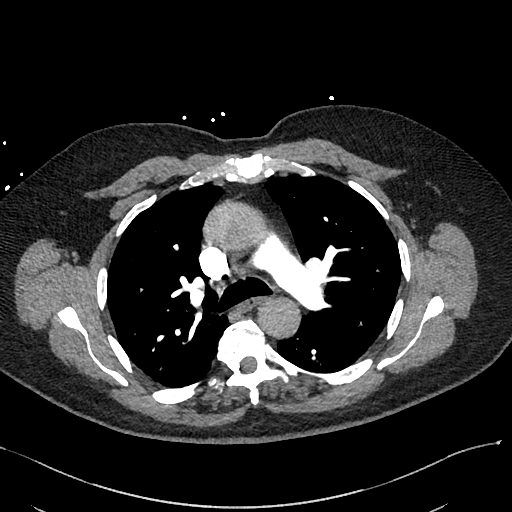
[im 295/432  lung]
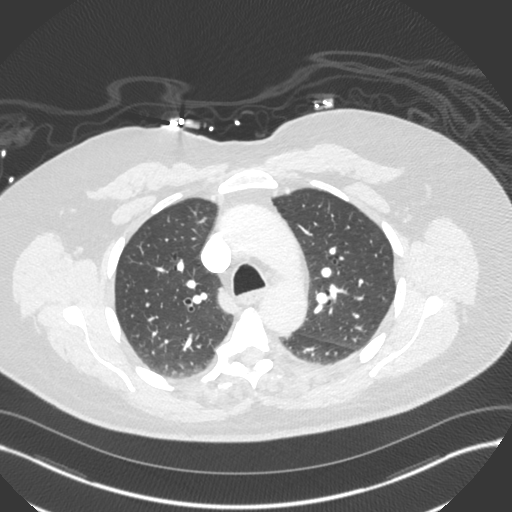
[im 318/432  soft-tissue]
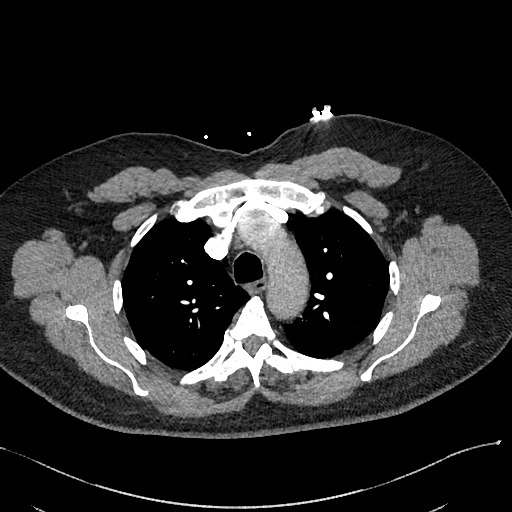
[im 363/432  lung]
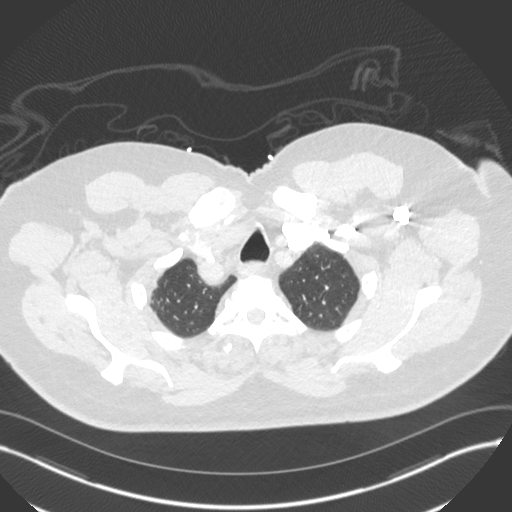
[im 386/432  soft-tissue]
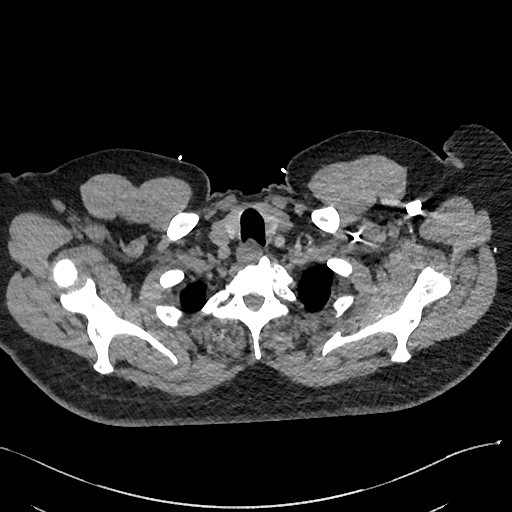
[im 409/432  lung]
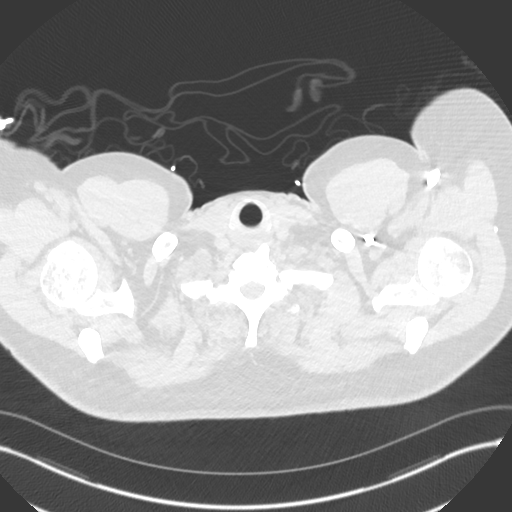

[Series 8: cor · coronal · 0.63mm/px · 3 of 157 slices shown]
[im 40/157  soft-tissue]
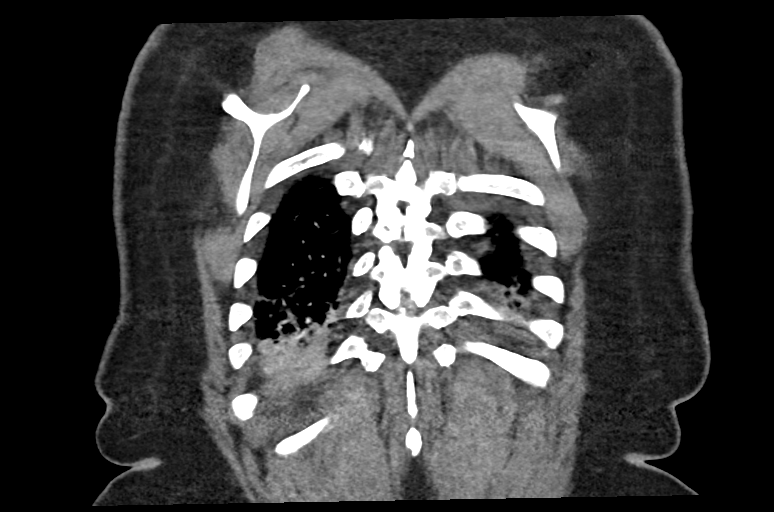
[im 79/157  soft-tissue]
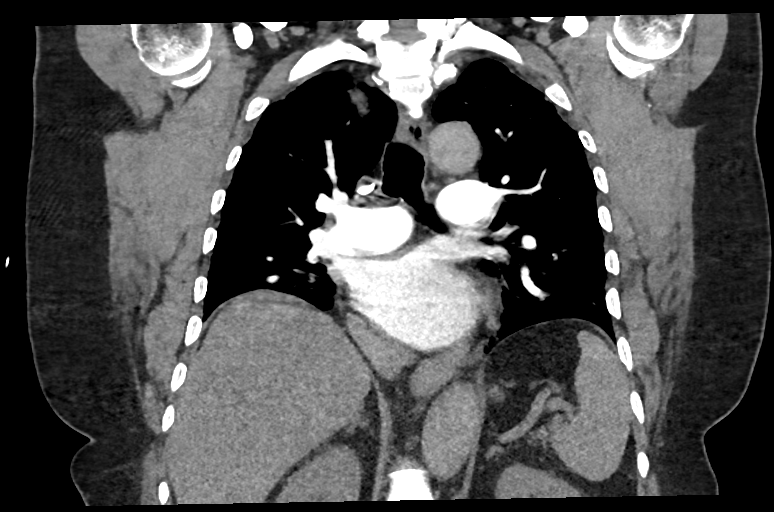
[im 118/157  soft-tissue]
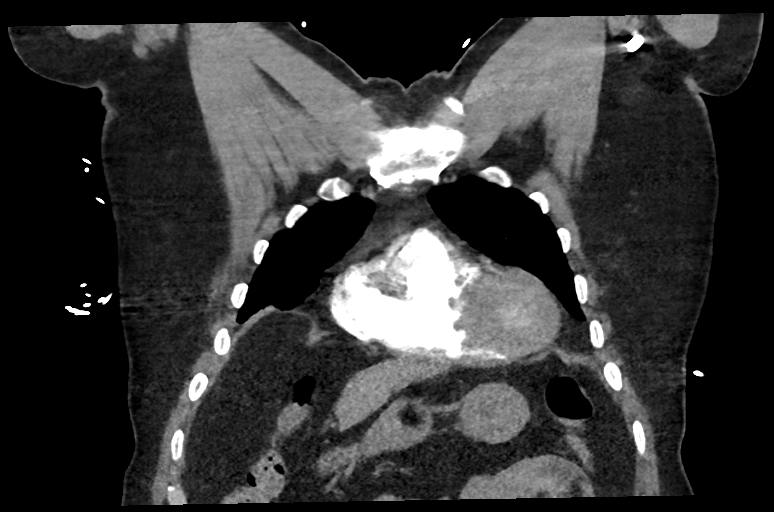

[18 of 46 positions shown; findings below may reference images not displayed]

FINDINGS: Cardiovascular: Satisfactory opacification of pulmonary arteries
noted, and no pulmonary emboli identified. No evidence of thoracic
aortic dissection or aneurysm.

Mediastinum/Nodes: No masses or pathologically enlarged lymph nodes
identified.

Lungs/Pleura: No pulmonary mass, consolidation, or effusion. Mild
atelectasis seen in both lung bases which is new since previous
study.

Upper abdomen: Small hiatal hernia noted.

Musculoskeletal: No suspicious bone lesions identified.

Review of the MIP images confirms the above findings.
IMPRESSION: No evidence of pulmonary embolism.

Mild bibasilar atelectasis.

Small hiatal hernia.

## 2023-03-02 IMAGING — DX DG CHEST 1V PORT
1 series · 1 of 1 positions shown · non-contrast
Comparison: 09/05/2020

CLINICAL DATA: Altered mental status

EXAM:
PORTABLE CHEST 1 VIEW

[chest ap]
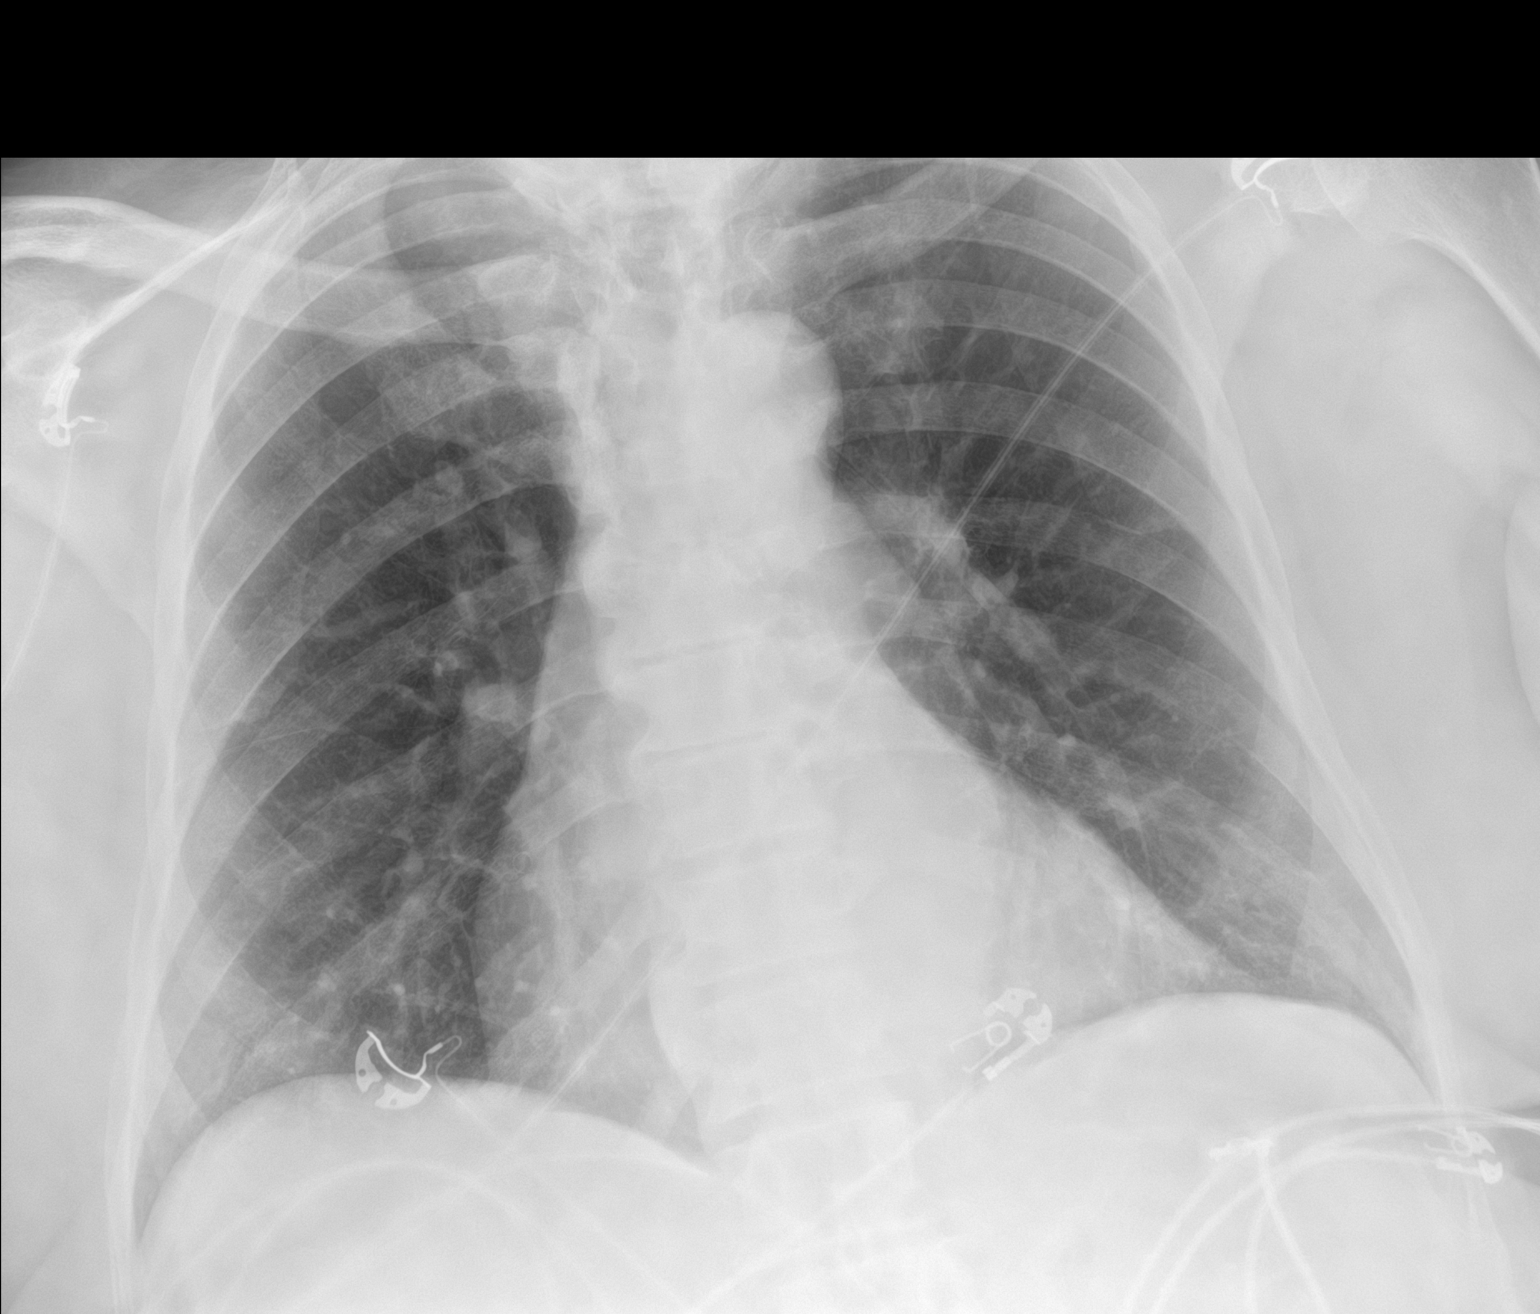

[1 of 1 positions shown; findings below may reference images not displayed]

FINDINGS: The heart size and mediastinal contours are within normal limits.
Both lungs are clear. The visualized skeletal structures are
unremarkable except for similar degenerative changes of the spine.
Tortuous aorta noted.
IMPRESSION: No active disease.
